# Patient Record
Sex: Male | Born: 1940 | Race: White | Hispanic: Yes | Marital: Married | Smoking: Never smoker
Health system: Southern US, Community
[De-identification: ages and names within clinical notes are randomized; demographics above are authoritative.]

## PROBLEM LIST (undated history)

## (undated) DIAGNOSIS — I25118 Atherosclerotic heart disease of native coronary artery with other forms of angina pectoris: Secondary | ICD-10-CM

## (undated) DIAGNOSIS — G479 Sleep disorder, unspecified: Secondary | ICD-10-CM

## (undated) DIAGNOSIS — I10 Essential (primary) hypertension: Secondary | ICD-10-CM

## (undated) HISTORY — DX: Atherosclerotic heart disease of native coronary artery with other forms of angina pectoris: I25.118

## (undated) HISTORY — PX: APPENDECTOMY: SHX54

## (undated) HISTORY — DX: Sleep disorder, unspecified: G47.9

---

## 2021-07-19 ENCOUNTER — Emergency Department (HOSPITAL_COMMUNITY): Payer: Medicare Other | Admitting: Anesthesiology

## 2021-07-19 ENCOUNTER — Encounter (HOSPITAL_COMMUNITY)
Admission: EM | Disposition: A | Discharge status: Rehabilitation Facility | Payer: Self-pay | Source: Home / Self Care | Attending: Neurology

## 2021-07-19 ENCOUNTER — Inpatient Hospital Stay (HOSPITAL_COMMUNITY)
Admission: EM | Admit: 2021-07-19 | Discharge: 2021-07-26 | DRG: 023 | Disposition: A | Discharge status: Rehabilitation Facility | Payer: Medicare Other | Attending: Neurology | Admitting: Neurology

## 2021-07-19 ENCOUNTER — Encounter (HOSPITAL_COMMUNITY): Payer: Self-pay | Admitting: Emergency Medicine

## 2021-07-19 ENCOUNTER — Emergency Department (HOSPITAL_COMMUNITY): Payer: Medicare Other

## 2021-07-19 DIAGNOSIS — H518 Other specified disorders of binocular movement: Secondary | ICD-10-CM | POA: Diagnosis present

## 2021-07-19 DIAGNOSIS — I472 Ventricular tachycardia: Secondary | ICD-10-CM | POA: Diagnosis present

## 2021-07-19 DIAGNOSIS — N390 Urinary tract infection, site not specified: Secondary | ICD-10-CM | POA: Diagnosis present

## 2021-07-19 DIAGNOSIS — E785 Hyperlipidemia, unspecified: Secondary | ICD-10-CM | POA: Diagnosis present

## 2021-07-19 DIAGNOSIS — I63132 Cerebral infarction due to embolism of left carotid artery: Secondary | ICD-10-CM | POA: Diagnosis present

## 2021-07-19 DIAGNOSIS — R7303 Prediabetes: Secondary | ICD-10-CM | POA: Diagnosis present

## 2021-07-19 DIAGNOSIS — D72829 Elevated white blood cell count, unspecified: Secondary | ICD-10-CM | POA: Diagnosis not present

## 2021-07-19 DIAGNOSIS — N179 Acute kidney failure, unspecified: Secondary | ICD-10-CM | POA: Diagnosis present

## 2021-07-19 DIAGNOSIS — N1832 Chronic kidney disease, stage 3b: Secondary | ICD-10-CM | POA: Diagnosis present

## 2021-07-19 DIAGNOSIS — I639 Cerebral infarction, unspecified: Secondary | ICD-10-CM

## 2021-07-19 DIAGNOSIS — I509 Heart failure, unspecified: Secondary | ICD-10-CM

## 2021-07-19 DIAGNOSIS — Z9282 Status post administration of tPA (rtPA) in a different facility within the last 24 hours prior to admission to current facility: Secondary | ICD-10-CM | POA: Diagnosis not present

## 2021-07-19 DIAGNOSIS — I493 Ventricular premature depolarization: Secondary | ICD-10-CM | POA: Diagnosis present

## 2021-07-19 DIAGNOSIS — I6522 Occlusion and stenosis of left carotid artery: Secondary | ICD-10-CM | POA: Diagnosis present

## 2021-07-19 DIAGNOSIS — I130 Hypertensive heart and chronic kidney disease with heart failure and stage 1 through stage 4 chronic kidney disease, or unspecified chronic kidney disease: Secondary | ICD-10-CM | POA: Diagnosis present

## 2021-07-19 DIAGNOSIS — E871 Hypo-osmolality and hyponatremia: Secondary | ICD-10-CM | POA: Diagnosis not present

## 2021-07-19 DIAGNOSIS — I63412 Cerebral infarction due to embolism of left middle cerebral artery: Secondary | ICD-10-CM | POA: Diagnosis present

## 2021-07-19 DIAGNOSIS — Z9289 Personal history of other medical treatment: Secondary | ICD-10-CM

## 2021-07-19 DIAGNOSIS — R7989 Other specified abnormal findings of blood chemistry: Secondary | ICD-10-CM | POA: Diagnosis not present

## 2021-07-19 DIAGNOSIS — I724 Aneurysm of artery of lower extremity: Secondary | ICD-10-CM | POA: Diagnosis not present

## 2021-07-19 DIAGNOSIS — K5900 Constipation, unspecified: Secondary | ICD-10-CM | POA: Diagnosis present

## 2021-07-19 DIAGNOSIS — D62 Acute posthemorrhagic anemia: Secondary | ICD-10-CM | POA: Diagnosis not present

## 2021-07-19 DIAGNOSIS — Z4659 Encounter for fitting and adjustment of other gastrointestinal appliance and device: Secondary | ICD-10-CM

## 2021-07-19 DIAGNOSIS — R29717 NIHSS score 17: Secondary | ICD-10-CM | POA: Diagnosis present

## 2021-07-19 DIAGNOSIS — I10 Essential (primary) hypertension: Secondary | ICD-10-CM | POA: Diagnosis not present

## 2021-07-19 DIAGNOSIS — R29810 Facial weakness: Secondary | ICD-10-CM | POA: Diagnosis present

## 2021-07-19 DIAGNOSIS — I240 Acute coronary thrombosis not resulting in myocardial infarction: Secondary | ICD-10-CM | POA: Diagnosis not present

## 2021-07-19 DIAGNOSIS — Z823 Family history of stroke: Secondary | ICD-10-CM

## 2021-07-19 DIAGNOSIS — J9601 Acute respiratory failure with hypoxia: Secondary | ICD-10-CM | POA: Diagnosis present

## 2021-07-19 DIAGNOSIS — Z20822 Contact with and (suspected) exposure to covid-19: Secondary | ICD-10-CM | POA: Diagnosis present

## 2021-07-19 DIAGNOSIS — G8191 Hemiplegia, unspecified affecting right dominant side: Secondary | ICD-10-CM | POA: Diagnosis present

## 2021-07-19 DIAGNOSIS — R4701 Aphasia: Secondary | ICD-10-CM | POA: Diagnosis present

## 2021-07-19 DIAGNOSIS — L8990 Pressure ulcer of unspecified site, unspecified stage: Secondary | ICD-10-CM | POA: Insufficient documentation

## 2021-07-19 DIAGNOSIS — I4891 Unspecified atrial fibrillation: Secondary | ICD-10-CM | POA: Diagnosis not present

## 2021-07-19 DIAGNOSIS — R471 Dysarthria and anarthria: Secondary | ICD-10-CM | POA: Diagnosis present

## 2021-07-19 DIAGNOSIS — I5033 Acute on chronic diastolic (congestive) heart failure: Secondary | ICD-10-CM | POA: Diagnosis present

## 2021-07-19 DIAGNOSIS — I513 Intracardiac thrombosis, not elsewhere classified: Secondary | ICD-10-CM | POA: Diagnosis present

## 2021-07-19 DIAGNOSIS — I63512 Cerebral infarction due to unspecified occlusion or stenosis of left middle cerebral artery: Secondary | ICD-10-CM | POA: Diagnosis not present

## 2021-07-19 DIAGNOSIS — G479 Sleep disorder, unspecified: Secondary | ICD-10-CM | POA: Diagnosis not present

## 2021-07-19 DIAGNOSIS — L89892 Pressure ulcer of other site, stage 2: Secondary | ICD-10-CM | POA: Diagnosis not present

## 2021-07-19 HISTORY — PX: IR CT HEAD LTD: IMG2386

## 2021-07-19 HISTORY — PX: RADIOLOGY WITH ANESTHESIA: SHX6223

## 2021-07-19 HISTORY — DX: Essential (primary) hypertension: I10

## 2021-07-19 HISTORY — PX: IR PERCUTANEOUS ART THROMBECTOMY/INFUSION INTRACRANIAL INC DIAG ANGIO: IMG6087

## 2021-07-19 LAB — DIFFERENTIAL
Abs Immature Granulocytes: 0.05 10*3/uL (ref 0.00–0.07)
Basophils Absolute: 0.1 10*3/uL (ref 0.0–0.1)
Basophils Relative: 1 %
Eosinophils Absolute: 0.2 10*3/uL (ref 0.0–0.5)
Eosinophils Relative: 2 %
Immature Granulocytes: 1 %
Lymphocytes Relative: 16 %
Lymphs Abs: 1.7 10*3/uL (ref 0.7–4.0)
Monocytes Absolute: 1.8 10*3/uL — ABNORMAL HIGH (ref 0.1–1.0)
Monocytes Relative: 17 %
Neutro Abs: 6.6 10*3/uL (ref 1.7–7.7)
Neutrophils Relative %: 63 %

## 2021-07-19 LAB — COMPREHENSIVE METABOLIC PANEL
ALT: 15 U/L (ref 0–44)
AST: 23 U/L (ref 15–41)
Albumin: 3.4 g/dL — ABNORMAL LOW (ref 3.5–5.0)
Alkaline Phosphatase: 54 U/L (ref 38–126)
Anion gap: 9 (ref 5–15)
BUN: 25 mg/dL — ABNORMAL HIGH (ref 8–23)
CO2: 22 mmol/L (ref 22–32)
Calcium: 8.8 mg/dL — ABNORMAL LOW (ref 8.9–10.3)
Chloride: 109 mmol/L (ref 98–111)
Creatinine, Ser: 1.53 mg/dL — ABNORMAL HIGH (ref 0.61–1.24)
GFR, Estimated: 46 mL/min — ABNORMAL LOW (ref >60)
Glucose, Bld: 77 mg/dL (ref 70–99)
Potassium: 3.9 mmol/L (ref 3.5–5.1)
Sodium: 140 mmol/L (ref 135–145)
Total Bilirubin: 1.1 mg/dL (ref 0.3–1.2)
Total Protein: 6.9 g/dL (ref 6.5–8.1)

## 2021-07-19 LAB — RESP PANEL BY RT-PCR (FLU A&B, COVID) ARPGX2
Influenza A by PCR: NEGATIVE
Influenza B by PCR: NEGATIVE
SARS Coronavirus 2 by RT PCR: NEGATIVE

## 2021-07-19 LAB — PROTIME-INR
INR: 1 (ref 0.8–1.2)
Prothrombin Time: 13.4 seconds (ref 11.4–15.2)

## 2021-07-19 LAB — I-STAT CHEM 8, ED
BUN: 30 mg/dL — ABNORMAL HIGH (ref 8–23)
Calcium, Ion: 0.96 mmol/L — ABNORMAL LOW (ref 1.15–1.40)
Chloride: 111 mmol/L (ref 98–111)
Creatinine, Ser: 1.5 mg/dL — ABNORMAL HIGH (ref 0.61–1.24)
Glucose, Bld: 76 mg/dL (ref 70–99)
HCT: 47 % (ref 39.0–52.0)
Hemoglobin: 16 g/dL (ref 13.0–17.0)
Potassium: 3.9 mmol/L (ref 3.5–5.1)
Sodium: 141 mmol/L (ref 135–145)
TCO2: 21 mmol/L — ABNORMAL LOW (ref 22–32)

## 2021-07-19 LAB — CBC
HCT: 48.9 % (ref 39.0–52.0)
Hemoglobin: 15.8 g/dL (ref 13.0–17.0)
MCH: 31 pg (ref 26.0–34.0)
MCHC: 32.3 g/dL (ref 30.0–36.0)
MCV: 95.9 fL (ref 80.0–100.0)
Platelets: 272 10*3/uL (ref 150–400)
RBC: 5.1 MIL/uL (ref 4.22–5.81)
RDW: 13.9 % (ref 11.5–15.5)
WBC: 10.4 10*3/uL (ref 4.0–10.5)
nRBC: 0 % (ref 0.0–0.2)

## 2021-07-19 LAB — CBG MONITORING, ED
Glucose-Capillary: 28 mg/dL — CL (ref 70–99)
Glucose-Capillary: 77 mg/dL (ref 70–99)

## 2021-07-19 LAB — APTT: aPTT: 34 seconds (ref 24–36)

## 2021-07-19 SURGERY — IR WITH ANESTHESIA
Anesthesia: General

## 2021-07-19 MED ORDER — LACTATED RINGERS IV SOLN: INTRAVENOUS | Status: DC | PRN

## 2021-07-19 MED ORDER — SODIUM CHLORIDE 0.9 % IV SOLN: INTRAVENOUS | Status: DC

## 2021-07-19 MED ORDER — SODIUM CHLORIDE 0.9 % IV SOLN
50.0000 mL | Freq: Once | INTRAVENOUS | Status: AC
  Administered 2021-07-19: 50 mL via INTRAVENOUS

## 2021-07-19 MED ORDER — FENTANYL CITRATE (PF) 100 MCG/2ML IJ SOLN
INTRAMUSCULAR | Status: AC
  Administered 2021-07-20: 25 ug via INTRAVENOUS
  Filled 2021-07-19: qty 2

## 2021-07-19 MED ORDER — CEFAZOLIN SODIUM-DEXTROSE 2-3 GM-%(50ML) IV SOLR
INTRAVENOUS | Status: DC | PRN
  Administered 2021-07-19: 2 g via INTRAVENOUS

## 2021-07-19 MED ORDER — GLYCOPYRROLATE 0.2 MG/ML IJ SOLN
INTRAMUSCULAR | Status: DC | PRN
  Administered 2021-07-19: .2 mg via INTRAVENOUS

## 2021-07-19 MED ORDER — IOHEXOL 350 MG/ML SOLN
100.0000 mL | Freq: Once | INTRAVENOUS | Status: AC | PRN
  Administered 2021-07-19: 100 mL via INTRAVENOUS

## 2021-07-19 MED ORDER — ASPIRIN 81 MG PO CHEW
CHEWABLE_TABLET | ORAL | Status: AC
  Filled 2021-07-19: qty 1

## 2021-07-19 MED ORDER — LIDOCAINE HCL (CARDIAC) PF 100 MG/5ML IV SOSY
PREFILLED_SYRINGE | INTRAVENOUS | Status: DC | PRN
  Administered 2021-07-19: 80 mg via INTRAVENOUS

## 2021-07-19 MED ORDER — PANTOPRAZOLE SODIUM 40 MG IV SOLR
40.0000 mg | Freq: Every day | INTRAVENOUS | Status: DC
  Administered 2021-07-20 (×2): 40 mg via INTRAVENOUS
  Filled 2021-07-19 (×2): qty 40

## 2021-07-19 MED ORDER — ACETAMINOPHEN 325 MG PO TABS
650.0000 mg | ORAL_TABLET | ORAL | Status: DC | PRN
  Administered 2021-07-22 – 2021-07-26 (×4): 650 mg via ORAL
  Filled 2021-07-19 (×4): qty 2

## 2021-07-19 MED ORDER — TICAGRELOR 90 MG PO TABS
ORAL_TABLET | ORAL | Status: AC
  Filled 2021-07-19: qty 2

## 2021-07-19 MED ORDER — EPTIFIBATIDE 20 MG/10ML IV SOLN
INTRAVENOUS | Status: AC
  Filled 2021-07-19: qty 10

## 2021-07-19 MED ORDER — SUCCINYLCHOLINE CHLORIDE 200 MG/10ML IV SOSY
PREFILLED_SYRINGE | INTRAVENOUS | Status: DC | PRN
  Administered 2021-07-19: 100 mg via INTRAVENOUS

## 2021-07-19 MED ORDER — STROKE: EARLY STAGES OF RECOVERY BOOK
Freq: Once | Status: AC
  Filled 2021-07-19: qty 1

## 2021-07-19 MED ORDER — IOHEXOL 300 MG/ML  SOLN
150.0000 mL | Freq: Once | INTRAMUSCULAR | Status: AC | PRN
  Administered 2021-07-19: 40 mL via INTRA_ARTERIAL

## 2021-07-19 MED ORDER — CEFAZOLIN SODIUM-DEXTROSE 2-4 GM/100ML-% IV SOLN
INTRAVENOUS | Status: AC
  Filled 2021-07-19: qty 100

## 2021-07-19 MED ORDER — NITROGLYCERIN 1 MG/10 ML FOR IR/CATH LAB
INTRA_ARTERIAL | Status: AC
  Filled 2021-07-19: qty 10

## 2021-07-19 MED ORDER — SODIUM CHLORIDE 0.9 % IV SOLN
INTRAVENOUS | Status: DC
Start: 2021-07-19 — End: 2021-07-21

## 2021-07-19 MED ORDER — CHLORHEXIDINE GLUCONATE 0.12% ORAL RINSE (MEDLINE KIT)
15.0000 mL | Freq: Two times a day (BID) | OROMUCOSAL | Status: DC
  Administered 2021-07-19 – 2021-07-20 (×3): 15 mL via OROMUCOSAL

## 2021-07-19 MED ORDER — VERAPAMIL HCL 2.5 MG/ML IV SOLN
INTRAVENOUS | Status: AC
  Filled 2021-07-19: qty 2

## 2021-07-19 MED ORDER — LABETALOL HCL 5 MG/ML IV SOLN
INTRAVENOUS | Status: AC
  Administered 2021-07-19: 10 mg via INTRAVENOUS
  Filled 2021-07-19: qty 4

## 2021-07-19 MED ORDER — PROPOFOL 500 MG/50ML IV EMUL
INTRAVENOUS | Status: DC | PRN
  Administered 2021-07-19: 60 ug/kg/min via INTRAVENOUS

## 2021-07-19 MED ORDER — ACETAMINOPHEN 160 MG/5ML PO SOLN: 650.0000 mg | ORAL | Status: DC | PRN

## 2021-07-19 MED ORDER — ALTEPLASE (STROKE) FULL DOSE INFUSION
0.9000 mg/kg | Freq: Once | INTRAVENOUS | Status: AC
  Administered 2021-07-19: 81.5 mg via INTRAVENOUS
  Filled 2021-07-19: qty 100

## 2021-07-19 MED ORDER — ACETAMINOPHEN 325 MG PO TABS: 650.0000 mg | ORAL_TABLET | ORAL | Status: DC | PRN

## 2021-07-19 MED ORDER — PHENYLEPHRINE HCL-NACL 20-0.9 MG/250ML-% IV SOLN
INTRAVENOUS | Status: DC | PRN
  Administered 2021-07-19: 40 ug/min via INTRAVENOUS

## 2021-07-19 MED ORDER — PHENYLEPHRINE HCL (PRESSORS) 10 MG/ML IV SOLN
INTRAVENOUS | Status: DC | PRN
  Administered 2021-07-19: 160 ug via INTRAVENOUS
  Administered 2021-07-19: 120 ug via INTRAVENOUS

## 2021-07-19 MED ORDER — ALBUMIN HUMAN 5 % IV SOLN: INTRAVENOUS | Status: DC | PRN

## 2021-07-19 MED ORDER — DEXAMETHASONE SODIUM PHOSPHATE 10 MG/ML IJ SOLN
INTRAMUSCULAR | Status: DC | PRN
  Administered 2021-07-19: 5 mg via INTRAVENOUS

## 2021-07-19 MED ORDER — SENNOSIDES-DOCUSATE SODIUM 8.6-50 MG PO TABS
1.0000 | ORAL_TABLET | Freq: Every evening | ORAL | Status: DC | PRN
  Administered 2021-07-25: 1 via ORAL
  Filled 2021-07-19: qty 1

## 2021-07-19 MED ORDER — CLEVIDIPINE BUTYRATE 0.5 MG/ML IV EMUL: 0.0000 mg/h | INTRAVENOUS | Status: DC

## 2021-07-19 MED ORDER — EPHEDRINE SULFATE 50 MG/ML IJ SOLN
INTRAMUSCULAR | Status: DC | PRN
  Administered 2021-07-19: 10 mg via INTRAVENOUS
  Administered 2021-07-19 (×3): 5 mg via INTRAVENOUS

## 2021-07-19 MED ORDER — ROCURONIUM BROMIDE 100 MG/10ML IV SOLN
INTRAVENOUS | Status: DC | PRN
  Administered 2021-07-19: 20 mg via INTRAVENOUS
  Administered 2021-07-19: 50 mg via INTRAVENOUS

## 2021-07-19 MED ORDER — SODIUM CHLORIDE 0.9% FLUSH
3.0000 mL | Freq: Once | INTRAVENOUS | Status: AC
  Administered 2021-07-23: 3 mL via INTRAVENOUS

## 2021-07-19 MED ORDER — ORAL CARE MOUTH RINSE
15.0000 mL | OROMUCOSAL | Status: DC
  Administered 2021-07-20 – 2021-07-21 (×10): 15 mL via OROMUCOSAL

## 2021-07-19 MED ORDER — CHLORHEXIDINE GLUCONATE CLOTH 2 % EX PADS
6.0000 | MEDICATED_PAD | Freq: Every day | CUTANEOUS | Status: DC
  Administered 2021-07-20 – 2021-07-26 (×7): 6 via TOPICAL

## 2021-07-19 MED ORDER — IOHEXOL 240 MG/ML SOLN
50.0000 mL | Freq: Once | INTRAMUSCULAR | Status: AC | PRN
  Administered 2021-07-19: 30 mL via INTRA_ARTERIAL

## 2021-07-19 MED ORDER — CANGRELOR TETRASODIUM 50 MG IV SOLR
INTRAVENOUS | Status: AC
  Filled 2021-07-19: qty 50

## 2021-07-19 MED ORDER — FENTANYL CITRATE (PF) 100 MCG/2ML IJ SOLN
INTRAMUSCULAR | Status: DC | PRN
  Administered 2021-07-19: 100 ug via INTRAVENOUS

## 2021-07-19 MED ORDER — ACETAMINOPHEN 650 MG RE SUPP: 650.0000 mg | RECTAL | Status: DC | PRN

## 2021-07-19 MED ORDER — LABETALOL HCL 5 MG/ML IV SOLN: 20.0000 mg | Freq: Once | INTRAVENOUS | Status: AC

## 2021-07-19 MED ORDER — CLOPIDOGREL BISULFATE 300 MG PO TABS
ORAL_TABLET | ORAL | Status: AC
  Filled 2021-07-19: qty 1

## 2021-07-19 MED ORDER — PROPOFOL 10 MG/ML IV BOLUS
INTRAVENOUS | Status: DC | PRN
  Administered 2021-07-19: 100 mg via INTRAVENOUS

## 2021-07-19 MED ORDER — TIROFIBAN HCL IN NACL 5-0.9 MG/100ML-% IV SOLN
INTRAVENOUS | Status: AC
  Filled 2021-07-19: qty 100

## 2021-07-19 MED ORDER — ONDANSETRON HCL 4 MG/2ML IJ SOLN
INTRAMUSCULAR | Status: DC | PRN
  Administered 2021-07-19: 4 mg via INTRAVENOUS

## 2021-07-19 NOTE — Progress Notes (Signed)
Pt intubated, under the care of anesthesia. Unable to assess NIH

## 2021-07-19 NOTE — Progress Notes (Signed)
IR tech holding pressure at this time to right groin for hemostasis

## 2021-07-19 NOTE — Anesthesia Procedure Notes (Signed)
Procedure Name: Intubation Date/Time: 07/19/2021 8:47 PM Performed by: Namiyah Grantham T, CRNA Pre-anesthesia Checklist: Patient identified, Emergency Drugs available, Suction available and Patient being monitored Patient Re-evaluated:Patient Re-evaluated prior to induction Oxygen Delivery Method: Circle system utilized Preoxygenation: Pre-oxygenation with 100% oxygen Induction Type: IV induction, Rapid sequence and Cricoid Pressure applied Ventilation: Mask ventilation without difficulty Laryngoscope Size: Mac and 4 Grade View: Grade I Tube type: Oral Tube size: 7.5 mm Number of attempts: 1 Airway Equipment and Method: Stylet and Oral airway Placement Confirmation: ETT inserted through vocal cords under direct vision, positive ETCO2 and breath sounds checked- equal and bilateral Secured at: 22 cm Tube secured with: Tape Dental Injury: Teeth and Oropharynx as per pre-operative assessment  Comments: Pt with bloody lip upon arrival to procedure room. Unable to tell source of bleeding. No further dental/oral damage noted

## 2021-07-19 NOTE — Progress Notes (Signed)
Patient ID: Roberto Dickson, male   DOB: Dec 16, 1940, 80 y.o.   MRN: [id number] INR. 80 Y RT H M MRS 0 LSW 1700. New onset of Lt gaze deviation,Rt sided weakness and aphasia.  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 Endovascular treatment D/W spouse and daughter .Reasons,procedure,alternatives reviewed ina 3 way call. Risks of ICH of 10 %,worsening neuro function , death and inability to revascularize discussed. Daughter and spouse expressed understanding and provided consent to the treatment. S.Ciena Sampley MD

## 2021-07-19 NOTE — Transfer of Care (Signed)
Immediate Anesthesia Transfer of Care Note  Patient: Roberto Dickson  Procedure(s) Performed: IR WITH ANESTHESIA  Patient Location: ICU  Anesthesia Type:General  Level of Consciousness: Patient remains intubated per anesthesia plan  Airway & Oxygen Therapy: Patient remains intubated per anesthesia plan and Patient placed on Ventilator (see vital sign flow sheet for setting)  Post-op Assessment: Report Dickson to RN and Post -op Vital signs reviewed and stable  Post vital signs: Reviewed and stable  Last Vitals:  Vitals Value Taken Time  BP 136/89 07/19/21 2304  Temp    Pulse 53 07/19/21 2315  Resp 17 07/19/21 2315  SpO2 98 % 07/19/21 2315  Vitals shown include unvalidated device data.  Last Pain:  Vitals:   07/19/21 1956  TempSrc: Oral  PainSc: 0-No pain         Complications: No notable events documented.

## 2021-07-19 NOTE — Progress Notes (Signed)
Scan done

## 2021-07-19 NOTE — Anesthesia Procedure Notes (Signed)
Arterial Line Insertion Start/End8/16/2022 8:48 PM, 07/19/2021 8:53 PM Performed by: Molli Hazard, CRNA, CRNA  Emergency situation Left, radial was placed Catheter size: 20 G Hand hygiene performed  and Seldinger technique used  Attempts: 1 Procedure performed without using ultrasound guided technique. Following insertion, dressing applied and Biopatch. Patient tolerated the procedure well with no immediate complications.

## 2021-07-19 NOTE — H&P (Addendum)
NEUROLOGY CONSULTATION NOTE   Date of service: July 19, 2021 Patient Name: Roberto Dickson MRN:  [id number] DOB:  [birth year] Reason for consult: "Stroke code" Requesting Provider: Erick Blinks, MD _ _ _   _ __   _ __ _ _  __ __   _ __   __ _  History of Present Illness  Roberto Dickson is a 80 y.o. Dickson with PMH significant for HTN, has not seen a PCP in several years who went to bed around 1700 to take a nap and woke up at 1830 and could not get out of bed and was not talking, was able to follow some commands for her. Wife called daughter who called EMS.  Was noted by EMS to have L gaze deviation, R facial droop and RUE weakness and aphasia. He was brought in Roberto a stroke code. Last saw his PCP maybe 4+ years ago. Has HTN but no other illness. Does not eat vegetables, no smoking. No hx of bleeding in his head or elsewhere, no recent MI, no recent trauma or surgery, not on any anticoagulation.  Glucose per EMS was 103, here initial reading was 23, repeat reading was 76. I suspect that the low initial value was probably an error.  mRS: 0 LKW: 1700 on 07/19/21 tPA: yes given. Risks and benefits discussed with patient's wife and daughter over the phone including 33% chance of improvement and about 3% risk of ICH. Thrombectomy: Noted to have L ICA occlusion at origin upto terminus with distal reconsitution. Dr. Corliss Skains discussed risks and benefits with mother and daughter over phone and I witnessed the consent process. They did not have any questions for Dr. Corliss Skains. Family consented for thrombectomy. NIHSS components Score: Comment  1a Level of Conscious 0[x]  1[]  2[]  3[]      1b LOC Questions 0[]  1[]  2[x]       1c LOC Commands 0[]  1[x]  2[]       2 Best Gaze 0[]  1[]  2[x]       3 Visual 0[]  1[]  2[x]  3[]      4 Facial Palsy 0[]  1[x]  2[]  3[]      5a Motor Arm - left 0[x]  1[]  2[]  3[]  4[]  UN[]    5b Motor Arm - Right 0[]  1[]  2[]  3[x]  4[]  UN[]    6a Motor Leg - Left 0[x]  1[]  2[]  3[]  4[]  UN[]    6b  Motor Leg - Right 0[]  1[x]  2[]  3[]  4[]  UN[]    7 Limb Ataxia 0[x]  1[]  2[]  3[]  UN[]     8 Sensory 0[]  1[x]  2[]  UN[]      9 Best Language 0[]  1[]  2[x]  3[]      10 Dysarthria 0[x]  1[]  2[]  UN[]      11 Extinct. and Inattention 0[]  1[]  2[x]       TOTAL: 17       ROS   Unable to obtain 2/2 Aphasia.  Past History   Past Medical History:  Diagnosis Date   Hypertension    Past Surgical History:  Procedure Laterality Date   NO PAST SURGERIES     History reviewed. No pertinent family history. Social History   Socioeconomic History   Marital status: Not on file    Spouse name: Not on file   Number of children: Not on file   Years of education: Not on file   Highest education level: Not on file  Occupational History   Not on file  Tobacco Use   Smoking status: Never   Smokeless tobacco: Never  Substance and Sexual Activity   Alcohol use:  Yes    Alcohol/week: 1.0 standard drink    Types: 1 Cans of beer per week   Drug use: Not on file   Sexual activity: Not on file  Other Topics Concern   Not on file  Social History Narrative   Not on file   Social Determinants of Health   Financial Resource Strain: Not on file  Food Insecurity: Not on file  Transportation Needs: Not on file  Physical Activity: Not on file  Stress: Not on file  Social Connections: Not on file   Not on File  Medications  (Not in a hospital admission)    Vitals   Vitals:   07/19/21 1956 07/19/21 2000 07/19/21 2015 07/19/21 2030  BP: (!) 146/103  (!) 145/87 (!) 148/98  Pulse: 78  80 77  Resp: 18  20 18   Temp: 97.8 F (36.6 C)  97.8 F (36.6 C) 98 F (36.7 C)  TempSrc: Oral     SpO2: 93%  93% 93%  Weight:  90.5 kg       There is no height or weight on file to calculate BMI.  Physical Exam   General: Laying comfortably in bed; in no acute distress.  HENT: Normal oropharynx and mucosa. Normal external appearance of ears and nose.  Neck: Supple, no pain or tenderness  CV: No JVD. No  peripheral edema.  Pulmonary: Symmetric Chest rise. Normal respiratory effort.  Abdomen: Soft to touch, non-tender.  Ext: No cyanosis, edema, or deformity  Skin: No rash. Normal palpation of skin.   Musculoskeletal: Normal digits and nails by inspection. No clubbing.   Neurologic Examination  Mental status/Cognition: Alert, aphasia precludes assessment of orientation. Speech/language: Non fluent, speech does not make sense. Able to intermittently/partially comprehend simple instructions. Cranial nerves:   CN II Pupils equal and reactive to light, does not blink to threat on the Right   CN III,IV,VI L gaze deviation   CN V normal sensation in V1, V2, and V3 segments bilaterally   CN VII R facial droop   CN VIII Does not turn head to speech on the right.   CN IX & X    CN XI    CN XII    Motor:  Muscle bulk: normal, tone flaccid in RUE Mvmt Root Nerve  Muscle Right Left Comments  SA C5/6 Ax Deltoid 0 4   EF C5/6 Mc Biceps 0    EE C6/7/8 Rad Triceps 0    WF C6/7 Med FCR     WE C7/8 PIN ECU     F Ab C8/T1 U ADM/FDI 1 5   HF L1/2/3 Fem Illopsoas 3 4   KE L2/3/4 Fem Quad     DF L4/5 D Peron Tib Ant 2 5   PF S1/2 Tibial Grc/Sol      Reflexes:  Right Left Comments  Pectoralis      Biceps (C5/6) 2 2   Brachioradialis (C5/6) 2 2    Triceps (C6/7) 2 2    Patellar (L3/4) 2 2    Achilles (S1)      Hoffman      Plantar     Jaw jerk    Sensation:  Light touch Grimaces to pinch in all extremities. Localizes in LUE and LLE but not in RUE and RLE.   Pin prick    Temperature    Vibration   Proprioception    Coordination/Complex Motor:  - Finger to Nose unable to do but no ovious ataxia - Heel  to shin unable to do - Rapid alternating movement unable to get him to do. - Gait: not safe to assess given concern for strokes.  Labs   CBC:  Recent Labs  Lab 07/19/21 1947 07/19/21 1953  WBC 10.4  --   NEUTROABS 6.6  --   HGB 15.8 16.0  HCT 48.9 47.0  MCV 95.9  --   PLT 272   --     Basic Metabolic Panel:  Lab Results  Component Value Date   NA 141 07/19/2021   K 3.9 07/19/2021   CO2 22 07/19/2021   GLUCOSE 76 07/19/2021   BUN 30 (H) 07/19/2021   CREATININE 1.50 (H) 07/19/2021   CALCIUM 8.8 (L) 07/19/2021   GFRNONAA 46 (L) 07/19/2021   Lipid Panel: No results found for: LDLCALC HgbA1c: No results found for: HGBA1C Urine Drug Screen: No results found for: LABOPIA, COCAINSCRNUR, LABBENZ, AMPHETMU, THCU, LABBARB  Alcohol Level No results found for: ETH  CT Head without contrast: Personally reviewed and CTH was negative for a large hypodensity concerning for a large territory infarct or hyperdensity concerning for an ICH. ASPECTS of 10.  CT angio Head and Neck with contrast: L ICA occlusion at the origin in the neck and all the way upto terminus. No intracranial occlusion or high grade stenosis.  MRI Brain: pending Impression   Michel Hendon is a 80 y.o. Dickson with PMH significant for HTN who presents with L gaze deviation, R facial droop and RUE weakness and aphasia. Found to have L ICA occlusion at the origin upto the terminus. 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  He was given tPA and taken for revascularization.  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   Secondary Diagnosis: Essential (primary) hypertension  Recommendations   Plan: - Frequent NeuroChecks for post tPA care per stroke unit protocol: - Initial CTH demonstrated no acute hemorrhage or mass - MRI Brain - pending. - CTA - with L ICA occlusion. - TTE - pending. - Lipid Panel: LDL - pending.  - Statin: if LDL > 70 - HbA1c: pending. - Antithrombotic: Start ASA 81 mg daily if 24 h CTH does not show acute hemorrhage - DVT prophylaxis: SCDs. Pharmacologic prophylaxis if 24 h CTH does not demonstrate acute hemorrhage - Systolic Blood Pressure goal: per Neuro IR for first 24 hours  after revascularization - Telemetry monitoring for arrhythmia: 72 hours - Swallow screen - ordered - PT/OT/SLP consults  HTN: - Goal SBP Roberto above - hold home Antihypertensive meds.   ______________________________________________________________________  This patient is critically ill and at significant risk of neurological worsening, death and care requires constant monitoring of vital signs, hemodynamics,respiratory and cardiac monitoring, neurological assessment, discussion with family, other specialists and medical decision making of high complexity. I spent 90 minutes of neurocritical care time  in the care of  this patient. This was time spent independent of any time provided by nurse practitioner or PA.  Erick Blinks [facility] Pager Number [phone] 07/19/2021  9:20 PM    Thank you for the opportunity to take part in the care of this patient. If you have any further questions, please contact the neurology consultation attending.  Signed,  Erick Blinks [facility] Pager Number [phone] _ _ _   _ __   _ __ _ _  __ __   _ __   __ _

## 2021-07-19 NOTE — ED Triage Notes (Addendum)
Pt bib EMS from home. Code stroke paged for flaccid right side and right facial droop after pt took a nap. LKW at 1750.  No history of stroke or blood thinners per family.   Vitals:  BP: 142/90 Hr: 90 O2: 93% CBG: 107

## 2021-07-19 NOTE — Anesthesia Preprocedure Evaluation (Addendum)
Anesthesia Evaluation  Patient identified by MRN, date of birth, ID bandPreop documentation limited or incomplete due to emergent nature of procedure.  Airway Mallampati: II  TM Distance: >3 FB Neck ROM: Full  Mouth opening: Limited Mouth Opening  Dental  (+) Poor Dentition Dried blood around upper/lower lips 2/2 to likely lip bite with stroke. No open lesions or uncontrolled bleeding. :   Pulmonary neg pulmonary ROS,    Pulmonary exam normal        Cardiovascular hypertension,  Rhythm:Regular Rate:Normal     Neuro/Psych CODE STROKE. LKN ~1700 with right sided weakness.  CVA negative psych ROS   GI/Hepatic negative GI ROS, Neg liver ROS,   Endo/Other  negative endocrine ROS  Renal/GU negative Renal ROS  negative genitourinary   Musculoskeletal negative musculoskeletal ROS (+)   Abdominal (+)  Abdomen: soft.    Peds  Hematology negative hematology ROS (+)   Anesthesia Other Findings Found at home post nap with right sided weakness. Spanish speaking only.  Reproductive/Obstetrics                            Anesthesia Physical Anesthesia Plan  ASA: 4 and emergent  Anesthesia Plan: General   Post-op Pain Management:    Induction: Intravenous and Rapid sequence  PONV Risk Score and Plan: 2 and Ondansetron, Dexamethasone and Treatment may vary due to age or medical condition  Airway Management Planned: Mask and Oral ETT  Additional Equipment: Arterial line  Intra-op Plan:   Post-operative Plan: Possible Post-op intubation/ventilation  Informed Consent:     Only emergency history available  Plan Discussed with: CRNA  Anesthesia Plan Comments: (Lab Results      Component                Value               Date                      WBC                      10.4                07/19/2021                HGB                      16.0                07/19/2021                HCT                       47.0                07/19/2021                MCV                      95.9                07/19/2021                PLT                      272  07/19/2021           Lab Results      Component                Value               Date                      NA                       141                 07/19/2021                K                        3.9                 07/19/2021                CO2                      22                  07/19/2021                GLUCOSE                  76                  07/19/2021                BUN                      30 (H)              07/19/2021                CREATININE               1.50 (H)            07/19/2021                CALCIUM                  8.8 (L)             07/19/2021                GFRNONAA                 46 (L)              07/19/2021          )       Anesthesia Quick Evaluation

## 2021-07-19 NOTE — Progress Notes (Signed)
Post IR intervention. Pt remains intubated and sedated. Under the care of ansthesia 

## 2021-07-19 NOTE — Progress Notes (Signed)
Anesthesia present for case 

## 2021-07-19 NOTE — Progress Notes (Signed)
Pharmacist Code Stroke Response  Notified to mix tPA at 1953 by Dr. Tyrone Schimke Delivered tPA to RN at 1957  tPA dose = 8.2mg  bolus over 1 minute followed by 73.3mg  for a total dose of 81.5mg  over 1 hour  Issues/delays encountered (if applicable): none  Vinnie Level, PharmD., BCPS, BCCCP Clinical Pharmacist Please refer to [facility] for unit-specific pharmacist

## 2021-07-19 NOTE — Procedures (Signed)
S/P Lt common carotid arteriogram followed by complete revascularization of occluded Lt ICA extracranially and intracranially and Lt  Lt MCA at origin with x 1 pass with 6 mmx 40 mm solitaiire X retriever and  contact aspiration achieving a TICI 3 revascularization . POst CT brain No ICH . Manual compression and quick clot applied for hemostasis  at the RT groin puncture site. Distal pulses all dopplerable. Patient left intubated awaiting covid test and patient not  being able to communicate due  to language barrier. S.Saiquan Hands MD

## 2021-07-19 NOTE — Progress Notes (Signed)
IR tech noted and marked hematoma after holding pressure. IR tech immediately began holding pressure again to right groin with quick clot.

## 2021-07-19 NOTE — ED Provider Notes (Signed)
[facility] EMERGENCY DEPARTMENT Provider Note   CSN: [id number] Arrival date & time: 07/19/21  1943  An emergency department physician performed an initial assessment on this suspected stroke patient at 60.  History Chief Complaint  Patient presents with   Code Stroke    Roberto Dickson is a 80 y.o. male.  This is 80 yo spanish speaking male who has history as below, presented to ED for evaluation of stroke like symptoms. Difficult to obtain history secondary to language barrier and severity of symptoms. I assessed the patients airway in the ambulance bay, this was stable and patient was met by neurology staff, sent directly to CT for evaluation of right sided deficit. History obtained through discussion with EMS. POC glucose was low upon arrival.   Level 5 caveat, AMS, acuity of situation   The history is provided by the EMS personnel. The history is limited by the condition of the patient and a language barrier. No language interpreter was used.      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 No pertinent family history.  Social History   Tobacco Use   Smoking status: Never   Smokeless tobacco: Never  Substance Use Topics   Alcohol use: Yes    Alcohol/week: 1.0 standard drink    Types: 1 Cans of beer per week    Home Medications Prior to Admission medications   Not on File    Allergies    Patient has no allergy information on record.  Review of Systems   Review of Systems  Unable to perform ROS: Acuity of condition   Physical Exam Updated Vital Signs BP 121/73   Pulse (!) 53   Temp 97.8 F (36.6 C) (Axillary)   Resp 14   Wt 90.5 kg   SpO2 98%   Physical  Exam Vitals and nursing note reviewed.  Constitutional:      General: He is in acute distress.     Appearance: He is not diaphoretic.  HENT:     Head: Normocephalic and atraumatic.     Right Ear: External ear normal.     Left Ear: External ear normal.     Nose: Nose normal.     Mouth/Throat:     Mouth: Mucous membranes are moist.  Eyes:     General: No scleral icterus.       Right eye: No discharge.        Left eye: No discharge.     Pupils: Pupils are equal, round, and reactive to light.  Cardiovascular:     Rate and Rhythm: Normal rate and regular rhythm.     Pulses: Normal pulses.     Heart sounds: Normal heart sounds. No murmur heard. Pulmonary:     Effort: Pulmonary effort is normal. No respiratory distress.     Breath sounds: No stridor.  Abdominal:     General: Abdomen is flat. There is no distension.  Musculoskeletal:        General: Normal range of motion.     Cervical back: Normal range of motion.  Skin:    General: Skin is warm and dry.     Capillary Refill:  Capillary refill takes less than 2 seconds.  Neurological:     Mental Status: He is lethargic and disoriented.     GCS: GCS eye subscore is 4. GCS verbal subscore is 4. GCS motor subscore is 6.     Cranial Nerves: Facial asymmetry present.     Sensory: Sensation is intact.     Motor: Motor function is intact.     Coordination: Coordination is intact.  Psychiatric:        Mood and Affect: Mood normal.        Behavior: Behavior normal.    ED Results / Procedures / Treatments   Labs (all labs ordered are listed, but only abnormal results are displayed) Labs Reviewed  DIFFERENTIAL - Abnormal; Notable for the following components:      Result Value   Monocytes Absolute 1.8 (*)    All other components within normal limits  COMPREHENSIVE METABOLIC PANEL - Abnormal; Notable for the following components:   BUN 25 (*)    Creatinine, Ser 1.53 (*)    Calcium 8.8 (*)    Albumin 3.4 (*)    GFR, Estimated 46  (*)    All other components within normal limits  I-STAT CHEM 8, ED - Abnormal; Notable for the following components:   BUN 30 (*)    Creatinine, Ser 1.50 (*)    Calcium, Ion 0.96 (*)    TCO2 21 (*)    All other components within normal limits  CBG MONITORING, ED - Abnormal; Notable for the following components:   Glucose-Capillary 28 (*)    All other components within normal limits  RESP PANEL BY RT-PCR (FLU A&B, COVID) ARPGX2  MRSA NEXT GEN BY PCR, NASAL  PROTIME-INR  APTT  CBC  HEMOGLOBIN A1C  LIPID PANEL  CBC WITH DIFFERENTIAL/PLATELET  BASIC METABOLIC PANEL  BLOOD GAS, ARTERIAL  CBG MONITORING, ED    EKG None  Radiology DG CHEST PORT 1 VIEW  Result Date: 07/20/2021 CLINICAL DATA:  Check gastric catheter placement EXAM: PORTABLE CHEST 1 VIEW COMPARISON:  None. FINDINGS: Endotracheal tube is noted in satisfactory position. Cardiac shadow is enlarged. Poor inspiratory effort is noted with bibasilar atelectasis. Gastric catheter is seen within the stomach. No bony abnormality is noted. IMPRESSION: Tubes and lines in satisfactory position. Mild bibasilar atelectatic changes are seen. Electronically Signed   By: Inez Catalina M.D.   On: 07/20/2021 00:29   CT HEAD CODE STROKE WO CONTRAST  Result Date: 07/19/2021 CLINICAL DATA:  Code stroke.  Acute neurologic deficit EXAM: CT HEAD WITHOUT CONTRAST TECHNIQUE: Contiguous axial images were obtained from the base of the skull through the vertex without intravenous contrast. COMPARISON:  None. FINDINGS: Brain: There is no mass, hemorrhage or extra-axial collection. The size and configuration of the ventricles and extra-axial CSF spaces are normal. 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 Vascular: No abnormal hyperdensity of the major intracranial arteries or dural venous sinuses. No intracranial atherosclerosis. Skull: The visualized skull base, calvarium and extracranial soft tissues are normal. Sinuses/Orbits: No fluid levels or advanced  mucosal thickening of the visualized paranasal sinuses. No mastoid or middle ear effusion. The orbits are normal. ASPECTS [facility] Stroke Program Early CT Score) - Ganglionic level infarction (caudate, lentiform nuclei, internal capsule, insula, M1-M3 cortex): 7 - Supraganglionic infarction (M4-M6 cortex): 3 Total score (0-10 with 10 being normal): 10 IMPRESSION: 1. No acute intracranial abnormality. 2. 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 3. ASPECTS is 10. 4. These results were called by telephone at the time of interpretation  on 07/19/2021 at 8:00 pm to provider Monroe , who verbally acknowledged these results. Electronically Signed   By: Ulyses Jarred M.D.   On: 07/19/2021 20:01   CT ANGIO HEAD NECK W WO CM (CODE STROKE)  Result Date: 07/19/2021 CLINICAL DATA:  Left MCA symptoms EXAM: CT ANGIOGRAPHY HEAD AND NECK TECHNIQUE: Multidetector CT imaging of the head and neck was performed using the standard protocol during bolus administration of intravenous contrast. Multiplanar CT image reconstructions and MIPs were obtained to evaluate the vascular anatomy. Carotid stenosis measurements (when applicable) are obtained utilizing NASCET criteria, using the distal internal carotid diameter as the denominator. CONTRAST:  196m OMNIPAQUE IOHEXOL 350 MG/ML SOLN COMPARISON:  None. FINDINGS: CTA NECK FINDINGS SKELETON: There is no bony spinal canal stenosis. No lytic or blastic lesion. OTHER NECK: Normal pharynx, larynx and major salivary glands. No cervical lymphadenopathy. Unremarkable thyroid gland. UPPER CHEST: No pneumothorax or pleural effusion. No nodules or masses. AORTIC ARCH: There is no calcific atherosclerosis of the aortic arch. There is no aneurysm, dissection or hemodynamically significant stenosis of the visualized portion of the aorta. Conventional 3 vessel aortic branching pattern. The visualized proximal subclavian arteries are widely patent. RIGHT CAROTID SYSTEM: Normal without aneurysm,  dissection or stenosis. LEFT CAROTID SYSTEM: The left ICA is occluded at its origin and remains occluded to the carotid terminus. VERTEBRAL ARTERIES: Left dominant configuration. Both origins are clearly patent. There is no dissection, occlusion or flow-limiting stenosis to the skull base (V1-V3 segments). CTA HEAD FINDINGS POSTERIOR CIRCULATION: --Vertebral arteries: Normal V4 segments. --Inferior cerebellar arteries: Normal. --Basilar artery: Normal. --Superior cerebellar arteries: Normal. --Posterior cerebral arteries (PCA): Normal. ANTERIOR CIRCULATION: --Intracranial internal carotid arteries: Normal. --Anterior cerebral arteries (ACA): Normal. Both A1 segments are present. Patent anterior communicating artery (a-comm). --Middle cerebral arteries (MCA): Right MCA is normal. The left MCA is opacified via collateral flow across the anterior communicating artery. VENOUS SINUSES: As permitted by contrast timing, patent. ANATOMIC VARIANTS: None Review of the MIP images confirms the above findings. IMPRESSION: 1. No intracranial arterial occlusion or high-grade stenosis. 2. 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 The left MCA is opacified via collateral flow across the anterior communicating artery. Aortic Atherosclerosis (ICD10-I70.0). Electronically Signed   By: KUlyses JarredM.D.   On: 07/19/2021 20:21    Procedures .Critical Care  Date/Time: 07/19/2021 8:56 PM Performed by: GJeanell Sparrow DO Authorized by: GJeanell Sparrow DO   Critical care provider statement:    Critical care time (minutes):  36   Critical care time was exclusive of:  Separately billable procedures and treating other patients   Critical care was necessary to treat or prevent imminent or life-threatening deterioration of the following conditions:  CNS failure or compromise (CVA, TPA)   Critical care was time spent personally by me on the following activities:  Discussions with  consultants, evaluation of patient's response to treatment, examination of patient, ordering and performing treatments and interventions, ordering and review of laboratory studies, ordering and review of radiographic studies, pulse oximetry, re-evaluation of patient's condition, obtaining history from patient or surrogate and review of old charts   Medications Ordered in ED Medications  sodium chloride flush (NS) 0.9 % injection 3 mL (has no administration in time range)  ceFAZolin (ANCEF) 2-4 GM/100ML-% IVPB (has no administration in time range)  nitroGLYCERIN 100 mcg/mL intra-arterial injection (has no administration in time range)  0.9 %  sodium chloride infusion ( Intravenous New Bag/Given  07/19/21 2324)  senna-docusate (Senokot-S) tablet 1 tablet (has no administration in time range)  pantoprazole (PROTONIX) injection 40 mg (40 mg Intravenous Given 07/20/21 0012)  acetaminophen (TYLENOL) tablet 650 mg (has no administration in time range)    Or  acetaminophen (TYLENOL) 160 MG/5ML solution 650 mg (has no administration in time range)    Or  acetaminophen (TYLENOL) suppository 650 mg (has no administration in time range)  0.9 %  sodium chloride infusion (has no administration in time range)  clevidipine (CLEVIPREX) infusion 0.5 mg/mL (has no administration in time range)  chlorhexidine gluconate (MEDLINE KIT) (PERIDEX) 0.12 % solution 15 mL (15 mLs Mouth Rinse Given 07/19/21 2345)  MEDLINE mouth rinse (0 mLs Mouth Rinse Duplicate 5/59/74 1638)  Chlorhexidine Gluconate Cloth 2 % PADS 6 each (has no administration in time range)  0.9 %  sodium chloride infusion (has no administration in time range)  norepinephrine (LEVOPHED) 1m in 2558mpremix infusion (2 mcg/min Intravenous New Bag/Given 07/20/21 0012)  propofol (DIPRIVAN) 1000 MG/100ML infusion (5 mcg/kg/min  90.5 kg Intravenous New Bag/Given 07/20/21 0014)  alteplase (ACTIVASE) 1 mg/mL infusion SOLN 81.5 mg (0 mg Intravenous Stopped 07/19/21  2100)    Followed by  0.9 %  sodium chloride infusion (0 mLs Intravenous Stopped 07/19/21 2136)  iohexol (OMNIPAQUE) 350 MG/ML injection 100 mL (100 mLs Intravenous Contrast Given 07/19/21 2009)  fentaNYL (SUBLIMAZE) 100 MCG/2ML injection (  Override pull for Anesthesia 07/19/21 2054)   stroke: mapping our early stages of recovery book ( Does not apply Given 07/19/21 2346)  labetalol (NORMODYNE) injection 20 mg (10 mg Intravenous Given 07/19/21 2008)  iohexol (OMNIPAQUE) 240 MG/ML injection 50 mL (30 mLs Intra-arterial Contrast Given 07/19/21 2223)  iohexol (OMNIPAQUE) 240 MG/ML injection 50 mL (30 mLs Intra-arterial Contrast Given 07/19/21 2223)  iohexol (OMNIPAQUE) 300 MG/ML solution 150 mL (40 mLs Intra-arterial Contrast Given 07/19/21 2224)    ED Course  I have reviewed the triage vital signs and the nursing notes.  Pertinent labs & imaging results that were available during my care of the patient were reviewed by me and considered in my medical decision making (see chart for details).    MDM Rules/Calculators/A&P                          [age]o male with history as above to ED for evaluation of stroke like symptoms. Vital signs stable. Serious etiology considered.  Pt with right sided deficit, TPA considered given timing of symptom onset.   CT imaging obtained, reviewed.  Pt given tPA after evaluation and clearance by neuro /stroke team, thrombectomy undertaken.  Patient admitted to ICU for further care.   Final Clinical Impression(s) / ED Diagnoses Final diagnoses:  [facility] ([facility]), [facility]   Rx / DC Orders ED Discharge Orders     None        GrJeanell SparrowDO 07/20/21 0045

## 2021-07-19 NOTE — Progress Notes (Addendum)
Transported pt to 4N with CRNA and RT on vent without difficulty. Report given to 4N RN via telephone and at bedside. Right groin with quick clot, gauze and pressure dressing. Dressing clean dry and intact at hand off.

## 2021-07-19 NOTE — Progress Notes (Signed)
PT intubated and sedated, under the care of anesthesia.

## 2021-07-19 NOTE — Progress Notes (Signed)
Post IR intervention. Pt remains intubated and sedated. Under the care of ansthesia

## 2021-07-20 ENCOUNTER — Inpatient Hospital Stay (HOSPITAL_COMMUNITY): Payer: Medicare Other

## 2021-07-20 ENCOUNTER — Encounter (HOSPITAL_COMMUNITY): Payer: Self-pay | Admitting: Interventional Radiology

## 2021-07-20 DIAGNOSIS — I63512 Cerebral infarction due to unspecified occlusion or stenosis of left middle cerebral artery: Secondary | ICD-10-CM | POA: Diagnosis not present

## 2021-07-20 DIAGNOSIS — J9601 Acute respiratory failure with hypoxia: Secondary | ICD-10-CM | POA: Diagnosis not present

## 2021-07-20 LAB — BASIC METABOLIC PANEL
Anion gap: 9 (ref 5–15)
BUN: 27 mg/dL — ABNORMAL HIGH (ref 8–23)
CO2: 20 mmol/L — ABNORMAL LOW (ref 22–32)
Calcium: 8.1 mg/dL — ABNORMAL LOW (ref 8.9–10.3)
Chloride: 113 mmol/L — ABNORMAL HIGH (ref 98–111)
Creatinine, Ser: 1.74 mg/dL — ABNORMAL HIGH (ref 0.61–1.24)
GFR, Estimated: 40 mL/min — ABNORMAL LOW (ref >60)
Glucose, Bld: 180 mg/dL — ABNORMAL HIGH (ref 70–99)
Potassium: 4 mmol/L (ref 3.5–5.1)
Sodium: 142 mmol/L (ref 135–145)

## 2021-07-20 LAB — LIPID PANEL
Cholesterol: 179 mg/dL (ref 0–200)
HDL: 30 mg/dL — ABNORMAL LOW (ref >40)
LDL Cholesterol: 132 mg/dL — ABNORMAL HIGH (ref 0–99)
Total CHOL/HDL Ratio: 6 RATIO
Triglycerides: 84 mg/dL (ref <150)
VLDL: 17 mg/dL (ref 0–40)

## 2021-07-20 LAB — CBC WITH DIFFERENTIAL/PLATELET
Abs Immature Granulocytes: 0.08 10*3/uL — ABNORMAL HIGH (ref 0.00–0.07)
Basophils Absolute: 0 10*3/uL (ref 0.0–0.1)
Basophils Relative: 0 %
Eosinophils Absolute: 0 10*3/uL (ref 0.0–0.5)
Eosinophils Relative: 0 %
HCT: 45.3 % (ref 39.0–52.0)
Hemoglobin: 14.4 g/dL (ref 13.0–17.0)
Immature Granulocytes: 1 %
Lymphocytes Relative: 4 %
Lymphs Abs: 0.5 10*3/uL — ABNORMAL LOW (ref 0.7–4.0)
MCH: 30.8 pg (ref 26.0–34.0)
MCHC: 31.8 g/dL (ref 30.0–36.0)
MCV: 96.8 fL (ref 80.0–100.0)
Monocytes Absolute: 0.2 10*3/uL (ref 0.1–1.0)
Monocytes Relative: 2 %
Neutro Abs: 10.1 10*3/uL — ABNORMAL HIGH (ref 1.7–7.7)
Neutrophils Relative %: 93 %
Platelets: 245 10*3/uL (ref 150–400)
RBC: 4.68 MIL/uL (ref 4.22–5.81)
RDW: 14.3 % (ref 11.5–15.5)
WBC: 10.9 10*3/uL — ABNORMAL HIGH (ref 4.0–10.5)
nRBC: 0 % (ref 0.0–0.2)

## 2021-07-20 LAB — POCT I-STAT 7, (LYTES, BLD GAS, ICA,H+H)
Acid-Base Excess: 0 mmol/L (ref 0.0–2.0)
Bicarbonate: 25 mmol/L (ref 20.0–28.0)
Calcium, Ion: 1.11 mmol/L — ABNORMAL LOW (ref 1.15–1.40)
HCT: 41 % (ref 39.0–52.0)
Hemoglobin: 13.9 g/dL (ref 13.0–17.0)
O2 Saturation: 100 %
Patient temperature: 97.8
Potassium: 4.6 mmol/L (ref 3.5–5.1)
Sodium: 142 mmol/L (ref 135–145)
TCO2: 26 mmol/L (ref 22–32)
pCO2 arterial: 41.4 mmHg (ref 32.0–48.0)
pH, Arterial: 7.388 (ref 7.350–7.450)
pO2, Arterial: 393 mmHg — ABNORMAL HIGH (ref 83.0–108.0)

## 2021-07-20 LAB — MRSA NEXT GEN BY PCR, NASAL: MRSA by PCR Next Gen: NOT DETECTED

## 2021-07-20 LAB — HEMOGLOBIN A1C
Hgb A1c MFr Bld: 5.9 % — ABNORMAL HIGH (ref 4.8–5.6)
Mean Plasma Glucose: 122.63 mg/dL

## 2021-07-20 LAB — ECHOCARDIOGRAM COMPLETE
Area-P 1/2: 4.19 cm2
Weight: 3192.26 oz

## 2021-07-20 MED ORDER — NOREPINEPHRINE 4 MG/250ML-% IV SOLN
2.0000 ug/min | INTRAVENOUS | Status: DC
  Administered 2021-07-20: 2 ug/min via INTRAVENOUS
  Filled 2021-07-20: qty 250

## 2021-07-20 MED ORDER — DOCUSATE SODIUM 50 MG/5ML PO LIQD
100.0000 mg | Freq: Two times a day (BID) | ORAL | Status: DC
  Filled 2021-07-20 (×2): qty 10

## 2021-07-20 MED ORDER — TAMSULOSIN HCL 0.4 MG PO CAPS
0.4000 mg | ORAL_CAPSULE | Freq: Every day | ORAL | Status: DC
  Administered 2021-07-20 – 2021-07-26 (×7): 0.4 mg via ORAL
  Filled 2021-07-20 (×7): qty 1

## 2021-07-20 MED ORDER — PROPOFOL 1000 MG/100ML IV EMUL
5.0000 ug/kg/min | INTRAVENOUS | Status: DC
  Administered 2021-07-20: 5 ug/kg/min via INTRAVENOUS
  Filled 2021-07-20: qty 100

## 2021-07-20 MED ORDER — NICARDIPINE HCL IN NACL 20-0.86 MG/200ML-% IV SOLN
3.0000 mg/h | INTRAVENOUS | Status: DC
  Filled 2021-07-20: qty 200

## 2021-07-20 MED ORDER — SODIUM CHLORIDE 0.9 % IV SOLN: 250.0000 mL | INTRAVENOUS | Status: DC

## 2021-07-20 MED ORDER — FENTANYL CITRATE (PF) 100 MCG/2ML IJ SOLN
25.0000 ug | INTRAMUSCULAR | Status: DC | PRN
  Filled 2021-07-20: qty 2

## 2021-07-20 MED ORDER — FENTANYL CITRATE (PF) 100 MCG/2ML IJ SOLN: 25.0000 ug | INTRAMUSCULAR | Status: DC | PRN

## 2021-07-20 MED ORDER — POLYETHYLENE GLYCOL 3350 17 G PO PACK
17.0000 g | PACK | Freq: Every day | ORAL | Status: DC
  Administered 2021-07-21 – 2021-07-26 (×5): 17 g via ORAL
  Filled 2021-07-20 (×6): qty 1

## 2021-07-20 MED ORDER — DOCUSATE SODIUM 50 MG/5ML PO LIQD
100.0000 mg | Freq: Two times a day (BID) | ORAL | Status: DC
  Administered 2021-07-20 – 2021-07-21 (×3): 100 mg via ORAL
  Filled 2021-07-20 (×4): qty 10

## 2021-07-20 MED ORDER — POLYETHYLENE GLYCOL 3350 17 G PO PACK
17.0000 g | PACK | Freq: Every day | ORAL | Status: DC
  Filled 2021-07-20: qty 1

## 2021-07-20 MED ORDER — PERFLUTREN LIPID MICROSPHERE
1.0000 mL | INTRAVENOUS | Status: DC | PRN
  Administered 2021-07-20: 2 mL via INTRAVENOUS
  Filled 2021-07-20: qty 10

## 2021-07-20 MED ORDER — HEPARIN (PORCINE) 25000 UT/250ML-% IV SOLN
1500.0000 [IU]/h | INTRAVENOUS | Status: DC
  Administered 2021-07-20: 1500 [IU]/h via INTRAVENOUS
  Administered 2021-07-21: 1300 [IU]/h via INTRAVENOUS
  Filled 2021-07-20 (×3): qty 250

## 2021-07-20 NOTE — Progress Notes (Signed)
Orthopedic Tech Progress Note Patient Details:  Roberto Dickson 19-Jul-1941 [id number]  Patient only needed KNEE IMMOBILIZER to keep knee straight until this morning at 8am.  Patient ID: Roberto Dickson, male   DOB: 1941-11-02, 80 y.o.   MRN: [id number]  Roberto Dickson 07/20/2021, 5:07 PM

## 2021-07-20 NOTE — Consult Note (Signed)
NAME:  Roberto Dickson, MRN:  [id number], DOB:  1941-09-28, LOS: 1 ADMISSION DATE:  07/19/2021, CONSULTATION DATE:  07/20/2021 REFERRING MD:  Dr Terrilee Files, CHIEF COMPLAINT:  right upper extrem weakness and aphasia    History of Present Illness:  80 y/o white male that presented to the ER from home.  The pt took a nap from 1700hrs to 1830hrs and woke up with right upper extrem weakness and aphasia.  The pt was a stroke alert and was given Tpa.  The pt then went to IR where they found occluded left ICA.  IR was able to open the left ICA.  The patient has a known history of hypertension but does not follow with a PCP.  No other pertinent past medical history is noted at this time.  Pertinent  Medical History  HTN  Significant Hospital Events: Including procedures, antibiotic start and stop dates in addition to other pertinent events     Objective   Blood pressure 121/73, pulse (!) 53, temperature 97.8 F (36.6 C), temperature source Axillary, resp. rate 14, weight 90.5 kg, SpO2 98 %.    Vent Mode: PRVC FiO2 (%):  [100 %] 100 % Set Rate:  [12 bmp] 12 bmp Vt Set:  [560 mL] 560 mL PEEP:  [6 cmH20] 6 cmH20 Plateau Pressure:  [16 cmH20] 16 cmH20   Intake/Output Summary (Last 24 hours) at 07/20/2021 0049 Last data filed at 07/19/2021 2308 Gross per 24 hour  Intake 1550 ml  Output --  Net 1550 ml   Filed Weights   07/19/21 2000  Weight: 90.5 kg    Examination: General: No acute distress  HENT: Anicteric MMM  Lungs: clear to auscultation No wheezing/ rales/ wheezing Cardiovascular: Regular  rate  no murmur  Abdomen: Soft nondistended  No bowel sounds Extremities: Distal pulses intact x4  No edema or cyanosis Neuro: Unconscious unresponsive.  Still paralyzed/ anesthesia  GU: Foley cath intact   Assessment & Plan:  Acute resp failure  ICA CVA S/p tpa  S/P Angiogram History of hypertension  Plan: Started on standard ventilator protocol.  Patient was left intubated overnight  because of concern for the airway and some mucosal oozing post systemic tPA. Propofol and as needed fentanyl for analgesia and pain control. Patient was given paralytics for the surgical procedure which has not worn off yet. Goal systolic blood pressures between 110 -140.  We will use Cardene for lowering blood pressure and norepinephrine to increase blood pressure to maintain that goal. N.p.o. Protonix for GI prophylaxis. Monitor I's/O's.  Avoid nephrotoxic medications as possible. Continue serial neurochecks   Best Practice (right click and "Reselect all SmartList Selections" daily)   Diet/type: NPO DVT prophylaxis: other GI prophylaxis: PPI Lines: N/A Foley:  N/A Code Status:  full code   Labs   CBC: Recent Labs  Lab 07/19/21 1947 07/19/21 1953  WBC 10.4  --   NEUTROABS 6.6  --   HGB 15.8 16.0  HCT 48.9 47.0  MCV 95.9  --   PLT 272  --     Basic Metabolic Panel: Recent Labs  Lab 07/19/21 1947 07/19/21 1953  NA 140 141  K 3.9 3.9  CL 109 111  CO2 22  --   GLUCOSE 77 76  BUN 25* 30*  CREATININE 1.53* 1.50*  CALCIUM 8.8*  --    GFR: CrCl cannot be calculated (Unknown ideal weight.). Recent Labs  Lab 07/19/21 1947  WBC 10.4    Liver Function Tests: Recent Labs  Lab  07/19/21 1947  AST 23  ALT 15  ALKPHOS 54  BILITOT 1.1  PROT 6.9  ALBUMIN 3.4*   No results for input(s): LIPASE, AMYLASE in the last 168 hours. No results for input(s): AMMONIA in the last 168 hours.  ABG    Component Value Date/Time   TCO2 21 (L) 07/19/2021 1953     Coagulation Profile: Recent Labs  Lab 07/19/21 1947  INR 1.0    Cardiac Enzymes: No results for input(s): CKTOTAL, CKMB, CKMBINDEX, TROPONINI in the last 168 hours.  HbA1C: No results found for: HGBA1C  CBG: Recent Labs  Lab 07/19/21 1948 07/19/21 1951  GLUCAP 28* 77    Review of Systems:   Unable to obtain secondary to neuro status  Past Medical History:  He,  has a past medical history of  Hypertension.   Surgical History:   Past Surgical History:  Procedure Laterality Date   NO PAST SURGERIES       Social History:   reports that he has never smoked. He has never used smokeless tobacco. He reports current alcohol use of about 1.0 standard drink per week.   Family History:  His family history is not on file.   Allergies Not on File   Home Medications  Prior to Admission medications   Not on File     Critical care time: 55 mins

## 2021-07-20 NOTE — Evaluation (Signed)
Physical Therapy Evaluation Patient Details Name: Roberto Dickson MRN: [id number] DOB: 24-Apr-1941 Today's Date: 07/20/2021   History of Present Illness  Pt is a 80 y.o. male who presented 07/19/21 with L gaze deviation, R facial droop, R weakness, and aphasia. tPA was administered. 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 S/p revascularization of L ICA 8/16. ETT 8/16-8/17. PMH: HTN   Clinical Impression  Pt presents with condition above and deficits mentioned below, see PT Problem List. PTA, he was living with his wife in a 2nd floor apartment with an elevator, and he was independent. Currently, pt displays R-sided weakness (primarily in his hand), incoordination, and decreased proprioception. In addition, he has poor deficits, spatial, and safety awareness and impaired balance and activity tolerance. Pt is at high risk for falls, needing modA for bed mobility, transfers, and to take several lateral steps at EOB with UE support this date. Pt would greatly benefit from intensive therapy in the CIR setting to maximize his safety and independence with all functional mobility prior to return home. Will continue to follow acutely.    Follow Up Recommendations CIR;Supervision/Assistance - 24 hour    Equipment Recommendations  Rolling walker with 5" wheels;3in1 (PT)    Recommendations for Other Services Rehab consult     Precautions / Restrictions Precautions Precautions: Fall Precaution Comments: A-line; SBP <140 Restrictions Weight Bearing Restrictions: No      Mobility  Bed Mobility Overal bed mobility: Needs Assistance Bed Mobility: Supine to Sit;Sit to Supine     Supine to sit: Mod assist Sit to supine: Mod assist   General bed mobility comments: Cues provided to manage legs off L EOB, good initiation by pt. Pt pulling to sit up with R UE as A-line in L wrist. ModA to assist legs back onto bed with return to supine.    Transfers Overall transfer level: Needs assistance Equipment  used: 1 person hand held assist Transfers: Sit to/from Stand Sit to Stand: Mod assist         General transfer comment: Pt with posterior bias, needing modA to shift weight anteriorly as he keeps his toes lifted off the ground leaning back on his heels. Feet sliding slightly with transfer to stand also.  Ambulation/Gait Ambulation/Gait assistance: Mod assist Gait Distance (Feet): 4 Feet Assistive device: 1 person hand held assist Gait Pattern/deviations: Step-through pattern;Decreased stride length;Decreased step length - right;Decreased dorsiflexion - right;Leaning posteriorly Gait velocity: reduced Gait velocity interpretation: <1.31 ft/sec, indicative of household ambulator General Gait Details: Pt with posterior lean, needing verbal and tactile cues to place weight through toes rather than heels while standing, momentary success. Poor R foot clearance with each step. ModA to steady pt with UE support while taking small, shuffling lateral steps R and L at EOB.  Stairs            Wheelchair Mobility    Modified Rankin (Stroke Patients Only) Modified Rankin (Stroke Patients Only) Pre-Morbid Rankin Score: No symptoms Modified Rankin: Moderately severe disability     Balance Overall balance assessment: Needs assistance Sitting-balance support: No upper extremity supported;Feet supported Sitting balance-Leahy Scale: Poor Sitting balance - Comments: Pt needing min-modA to sit statically due to posterior and R lateral lean, cues to correct and match body position with therapist anterior to him. Postural control: Right lateral lean;Posterior lean Standing balance support: Bilateral upper extremity supported;During functional activity Standing balance-Leahy Scale: Poor Standing balance comment: Reliant on UE support and physical assistance to stand.  Pertinent Vitals/Pain Pain Assessment: No/denies pain    Home Living Family/patient  expects to be discharged to:: Private residence Living Arrangements: Spouse/significant other Available Help at Discharge: Family;Available 24 hours/day Type of Home: Apartment (2nd floor, elevator) Home Access: Elevator     Home Layout: One level Home Equipment: Grab bars - tub/shower      Prior Function Level of Independence: Independent               Hand Dominance   Dominant Hand: Right    Extremity/Trunk Assessment   Upper Extremity Assessment Upper Extremity Assessment: RUE deficits/detail RUE Deficits / Details: Trace muscle activation for finger movement, no AROM noted; generalized weakness throughout, but able to move shoulder, elbow, and wrist against gravity; denied numbness/tingling; rests with fingers and wirst flexed RUE Sensation: decreased proprioception RUE Coordination: decreased fine motor;decreased gross motor    Lower Extremity Assessment Lower Extremity Assessment: RLE deficits/detail RLE Deficits / Details: Gross weakness MMT scores of 4 to 4+ compared to 4+ to 5 on L; denied numbness/tingling; incoordination noted RLE Sensation: decreased proprioception RLE Coordination: decreased fine motor;decreased gross motor    Cervical / Trunk Assessment Cervical / Trunk Assessment: Normal  Communication   Communication: Prefers language other than English (Spanish)  Cognition Arousal/Alertness: Awake/alert Behavior During Therapy: Flat affect Overall Cognitive Status: Impaired/Different from baseline Area of Impairment: Following commands;Safety/judgement;Awareness;Problem solving                       Following Commands: Follows one step commands inconsistently;Follows one step commands with increased time Safety/Judgement: Decreased awareness of safety;Decreased awareness of deficits Awareness: Emergent Problem Solving: Slow processing;Difficulty sequencing;Requires verbal cues;Requires tactile cues General Comments: Pt with poor deficits  and safety awareness, needing repeated cues to correct his posture and reduce his posterior lean. Poor problem-solving to correct his deviations.      General Comments General comments (skin integrity, edema, etc.): Wife present. Daughter present and providing interpretation throughout session.    Exercises     Assessment/Plan    PT Assessment Patient needs continued PT services  PT Problem List Decreased strength;Decreased range of motion;Decreased activity tolerance;Decreased mobility;Decreased balance;Decreased coordination;Decreased cognition;Decreased knowledge of use of DME;Decreased safety awareness       PT Treatment Interventions DME instruction;Gait training;Functional mobility training;Therapeutic activities;Therapeutic exercise;Balance training;Neuromuscular re-education;Cognitive remediation;Patient/family education    PT Goals (Current goals can be found in the Care Plan section)  Acute Rehab PT Goals Patient Stated Goal: to improve PT Goal Formulation: With patient/family Time For Goal Achievement: 08/03/21 Potential to Achieve Goals: Good    Frequency Min 4X/week   Barriers to discharge        Co-evaluation               AM-PAC PT "6 Clicks" Mobility  Outcome Measure Help needed turning from your back to your side while in a flat bed without using bedrails?: A Lot Help needed moving from lying on your back to sitting on the side of a flat bed without using bedrails?: A Lot Help needed moving to and from a bed to a chair (including a wheelchair)?: A Lot Help needed standing up from a chair using your arms (e.g., wheelchair or bedside chair)?: A Lot Help needed to walk in hospital room?: A Lot Help needed climbing 3-5 steps with a railing? : Total 6 Click Score: 11    End of Session Equipment Utilized During Treatment: Gait belt Activity Tolerance: Patient tolerated treatment well Patient left: in  bed;with call bell/phone within reach;with bed alarm  set;with family/visitor present Nurse Communication: Mobility status PT Visit Diagnosis: Unsteadiness on feet (R26.81);Other abnormalities of gait and mobility (R26.89);Muscle weakness (generalized) (M62.81);Difficulty in walking, not elsewhere classified (R26.2);Other symptoms and signs involving the nervous system (R29.898);Hemiplegia and hemiparesis Hemiplegia - Right/Left: Right Hemiplegia - dominant/non-dominant: Dominant Hemiplegia - caused by: Cerebral infarction    Time: 3545-6256 PT Time Calculation (min) (ACUTE ONLY): 26 min   Charges:   PT Evaluation $PT Eval Moderate Complexity: 1 Mod PT Treatments $Therapeutic Activity: 8-22 mins        Raymond Gurney, PT, DPT Acute Rehabilitation Services  Pager: [phone] Office: [phone]   [name] 07/20/2021, 6:19 PM

## 2021-07-20 NOTE — Progress Notes (Signed)
SLP Cancellation Note  Patient Details Name: Quin Mathenia MRN: [id number] DOB: 1941-08-11   Cancelled treatment:       Reason Eval/Treat Not Completed: Patient not medically ready. Still intubated this am. Will follow for readiness.    Maycel Riffe, Riley Nearing 07/20/2021, 7:30 AM

## 2021-07-20 NOTE — Progress Notes (Signed)
ANTICOAGULATION CONSULT NOTE - Initial Consult  Pharmacy Consult for heparin Indication:  Apical thrombus (recent stroke)  No Known Allergies  Patient Measurements: Weight: 90.5 kg (199 lb 8.3 oz) Heparin Dosing Weight:   Vital Signs: Temp: 98.5 F (36.9 C) (08/17 1600) Temp Source: Axillary (08/17 1200) BP: 113/76 (08/17 1800) Pulse Rate: 88 (08/17 1800)  Labs: Recent Labs    07/19/21 1947 07/19/21 1953 07/20/21 0105 07/20/21 0536  HGB 15.8 16.0 13.9 14.4  HCT 48.9 47.0 41.0 45.3  PLT 272  --   --  245  APTT 34  --   --   --   LABPROT 13.4  --   --   --   INR 1.0  --   --   --   CREATININE 1.53* 1.50*  --  1.74*    CrCl cannot be calculated (Unknown ideal weight.).   Medical History: Past Medical History:  Diagnosis Date   Hypertension     Medications:  Medications Prior to Admission  Medication Sig Dispense Refill Last Dose   enalapril (VASOTEC) 20 MG tablet Take 20 mg by mouth daily.   07/19/2021   hydrochlorothiazide (HYDRODIURIL) 12.5 MG tablet Take 12.5 mg by mouth daily.   07/19/2021   ibuprofen (ADVIL) 200 MG tablet Take 200 mg by mouth every 6 (six) hours as needed for mild pain.   07/17/2021    Assessment: 50 YOM who presented with an ischemic stroke s/p alteplase at 8 PM on 8/16. 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 Pharmacy consulted to start IV heparin per stroke protocol.  H/H and Plt wnl   Goal of Therapy:  Heparin level 0.3-0.5 units/ml Monitor platelets by anticoagulation protocol: Yes   Plan:  -Start heparin 1500 units/hr. No bolus -F/u 8 hr HL -Monitor daily HL, CBC and s/s of bleeding   Vinnie Level, PharmD., BCPS, BCCCP Clinical Pharmacist Please refer to [facility] for unit-specific pharmacist

## 2021-07-20 NOTE — Progress Notes (Signed)
Patient extubated to 3 L [place] .  Able to follow commands and tolerated wean mode.  Good cough, able to speak sat 94%.  Will continue to monitor patient.

## 2021-07-20 NOTE — Progress Notes (Signed)
OT Cancellation Note  Patient Details Name: Roberto Dickson MRN: [id number] DOB: 08-19-1941   Cancelled Treatment:    Reason Eval/Treat Not Completed: Active bedrest order (Intubated.)  [facility] Luisa Dago, OT/L   Acute OT Clinical Specialist Acute Rehabilitation Services Pager [phone] Office [phone]  07/20/2021, 8:36 AM

## 2021-07-20 NOTE — Progress Notes (Signed)
Per Dr. Nicole Cella keep SBP 110s-140s since pt is on pressors to bring up BP.

## 2021-07-20 NOTE — Anesthesia Postprocedure Evaluation (Signed)
Anesthesia Post Note  Patient: Roberto Dickson  Procedure(s) Performed: IR WITH ANESTHESIA     Patient location during evaluation: SICU Anesthesia Type: General Level of consciousness: sedated Pain management: pain level controlled Vital Signs Assessment: post-procedure vital signs reviewed and stable Respiratory status: patient remains intubated per anesthesia plan Cardiovascular status: stable Postop Assessment: no apparent nausea or vomiting Anesthetic complications: no   No notable events documented.  Last Vitals:  Vitals:   07/20/21 0015 07/20/21 0106  BP: 121/73   Pulse: (!) 53 60  Resp: 14 14  Temp:    SpO2: 98% 99%    Last Pain:  Vitals:   07/20/21 0000  TempSrc: Axillary  PainSc:                  Nelle Don Sani Madariaga

## 2021-07-20 NOTE — Progress Notes (Signed)
Referring Physician(s): Dr. Erick Blinks  Supervising Physician: Julieanne Cotton  Patient Status:  Roberto Dickson - In-pt  Chief Complaint: Code Stroke  Subjective: Patient remains intubated this AM.  Grimace on face.  Follows all commands even when given in [place].  Moving all extremities.   Attempts to move RUE, RLE.    Allergies: Patient has no allergy information on record.  Medications: Prior to Admission medications   Not on File     Vital Signs: BP 125/85   Pulse 84   Temp (!) (P) 97.5 F (36.4 C) (Oral)   Resp 19   Wt 199 lb 8.3 oz (90.5 kg)   SpO2 94%   Physical Exam NAD, alert Groin:  Pressure dressing with quikclot in place.  No evidence of hematoma or pseudoaneurysm.  Lightly discoloration/ecchymosis in the medial thigh below the area of the large dressing which is isolated and  does not appear to be related to the procedure site.   Neuro: intubated, sedation has been turned off this AM for possible extubation.  Following commands.  Shows some attempt to move the RUE and RLE.  Moving left side spontaneously.   Imaging: DG CHEST PORT 1 VIEW  Result Date: 07/20/2021 CLINICAL DATA:  Check gastric catheter placement EXAM: PORTABLE CHEST 1 VIEW COMPARISON:  None. FINDINGS: Endotracheal tube is noted in satisfactory position. Cardiac shadow is enlarged. Poor inspiratory effort is noted with bibasilar atelectasis. Gastric catheter is seen within the stomach. No bony abnormality is noted. IMPRESSION: Tubes and lines in satisfactory position. Mild bibasilar atelectatic changes are seen. Electronically Signed   By: Alcide Clever M.D.   On: 07/20/2021 00:29   CT HEAD CODE STROKE WO CONTRAST  Result Date: 07/19/2021 CLINICAL DATA:  Code stroke.  Acute neurologic deficit EXAM: CT HEAD WITHOUT CONTRAST TECHNIQUE: Contiguous axial images were obtained from the base of the skull through the vertex without intravenous contrast. COMPARISON:  None. FINDINGS: Brain: There  is no mass, hemorrhage or extra-axial collection. The size and configuration of the ventricles and extra-axial CSF spaces are normal. 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 Vascular: No abnormal hyperdensity of the major intracranial arteries or dural venous sinuses. No intracranial atherosclerosis. Skull: The visualized skull base, calvarium and extracranial soft tissues are normal. Sinuses/Orbits: No fluid levels or advanced mucosal thickening of the visualized paranasal sinuses. No mastoid or middle ear effusion. The orbits are normal. ASPECTS [facility] Stroke Program Early CT Score) - Ganglionic level infarction (caudate, lentiform nuclei, internal capsule, insula, M1-M3 cortex): 7 - Supraganglionic infarction (M4-M6 cortex): 3 Total score (0-10 with 10 being normal): 10 IMPRESSION: 1. No acute intracranial abnormality. 2. 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 3. ASPECTS is 10. 4. These results were called by telephone at the time of interpretation on 07/19/2021 at 8:00 pm to provider [facility] , who verbally acknowledged these results. Electronically Signed   By: Deatra Robinson M.D.   On: 07/19/2021 20:01   CT ANGIO HEAD NECK W WO CM (CODE STROKE)  Result Date: 07/19/2021 CLINICAL DATA:  Left MCA symptoms EXAM: CT ANGIOGRAPHY HEAD AND NECK TECHNIQUE: Multidetector CT imaging of the head and neck was performed using the standard protocol during bolus administration of intravenous contrast. Multiplanar CT image reconstructions and MIPs were obtained to evaluate the vascular anatomy. Carotid stenosis measurements (when applicable) are obtained utilizing NASCET criteria, using the distal internal carotid diameter as the denominator. CONTRAST:  OMNIPAQUE IOHEXOL 350 MG/ML SOLN COMPARISON:  None. FINDINGS: CTA NECK FINDINGS SKELETON: There is  no bony spinal canal stenosis. No lytic or blastic lesion. OTHER NECK: Normal pharynx, larynx and major salivary glands. No cervical lymphadenopathy. Unremarkable thyroid  gland. UPPER CHEST: No pneumothorax or pleural effusion. No nodules or masses. AORTIC ARCH: There is no calcific atherosclerosis of the aortic arch. There is no aneurysm, dissection or hemodynamically significant stenosis of the visualized portion of the aorta. Conventional 3 vessel aortic branching pattern. The visualized proximal subclavian arteries are widely patent. RIGHT CAROTID SYSTEM: Normal without aneurysm, dissection or stenosis. LEFT CAROTID SYSTEM: The left ICA is occluded at its origin and remains occluded to the carotid terminus. VERTEBRAL ARTERIES: Left dominant configuration. Both origins are clearly patent. There is no dissection, occlusion or flow-limiting stenosis to the skull base (V1-V3 segments). CTA HEAD FINDINGS POSTERIOR CIRCULATION: --Vertebral arteries: Normal V4 segments. --Inferior cerebellar arteries: Normal. --Basilar artery: Normal. --Superior cerebellar arteries: Normal. --Posterior cerebral arteries (PCA): Normal. ANTERIOR CIRCULATION: --Intracranial internal carotid arteries: Normal. --Anterior cerebral arteries (ACA): Normal. Both A1 segments are present. Patent anterior communicating artery (a-comm). --Middle cerebral arteries (MCA): Right MCA is normal. The left MCA is opacified via collateral flow across the anterior communicating artery. VENOUS SINUSES: As permitted by contrast timing, patent. ANATOMIC VARIANTS: None Review of the MIP images confirms the above findings. IMPRESSION: 1. No intracranial arterial occlusion or high-grade stenosis. 2. 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 The left MCA is opacified via collateral flow across the anterior communicating artery. Aortic Atherosclerosis (ICD10-I70.0). Electronically Signed   By: Deatra Robinson M.D.   On: 07/19/2021 20:21    Labs:  CBC: Recent Labs    07/19/21 1947 07/19/21 1953 07/20/21 0105 07/20/21 0536  WBC 10.4  --   --  10.9*  HGB 15.8 16.0 13.9 14.4  HCT  48.9 47.0 41.0 45.3  PLT 272  --   --  245    COAGS: Recent Labs    07/19/21 1947  INR 1.0  APTT 34    BMP: Recent Labs    07/19/21 1947 07/19/21 1953 07/20/21 0105 07/20/21 0536  NA 140 141 142 142  K 3.9 3.9 4.6 4.0  CL 109 111  --  113*  CO2 22  --   --  20*  GLUCOSE 77 76  --  180*  BUN 25* 30*  --  27*  CALCIUM 8.8*  --   --  8.1*  CREATININE 1.53* 1.50*  --  1.74*  GFRNONAA 46*  --   --  40*    LIVER FUNCTION TESTS: Recent Labs    07/19/21 1947  BILITOT 1.1  AST 23  ALT 15  ALKPHOS 54  PROT 6.9  ALBUMIN 3.4*    Assessment and Plan: Occluded Lt ICA extracranially and intracranially and Lt  Lt MCA s/p mechanical thrombectomy and contact aspiration achieving a TICI 3 revascularization  Patient remains intubated during assessement this AM.  Per RN, weaning for likely extubation later this AM.  Following commands.  Attempts to move RUE, able to demonstrate some flexion of the RLE.  Groin stable without evidence of complication after closure device failure. Quikclot to remain in place today.  Note episodes of afib overnight. NIR following.   Electronically Signed: Hoyt Koch, PA 07/20/2021, 11:44 AM   I spent a total of 15 Minutes at the the patient's bedside AND on the patient's hospital floor or unit, greater than 50% of which was counseling/coordinating care for L ICA and MCA occlusion.

## 2021-07-20 NOTE — Progress Notes (Signed)
  Echocardiogram 2D Echocardiogram with contrast has been performed.  Roosvelt Maser F 07/20/2021, 2:51 PM

## 2021-07-20 NOTE — Progress Notes (Signed)
PT Cancellation Note  Patient Details Name: Roberto Dickson MRN: [id number] DOB: [birth year]   Cancelled Treatment:    Reason Eval/Treat Not Completed: Active bedrest order;Patient not medically ready. RN reporting waiting on IR to come assess bleeding with pressure dressing in place. Will follow-up as time permits and as appropriate.   Raymond Gurney, PT, DPT Acute Rehabilitation Services  Pager: [phone] Office: [phone]    [name] 07/20/2021, 8:48 AM

## 2021-07-20 NOTE — Progress Notes (Signed)
RT transported pt from IR to 4N32 w/o complications. RT will cont to monitor.

## 2021-07-20 NOTE — Progress Notes (Addendum)
STROKE TEAM PROGRESS NOTE    Interval History   No acute events overnight, remains intubated with plans to extubate today.   With sedation paused he is able to follow commands and move all extremities.   Informed about stroke, stroke work up and plan to extubate later today.    Pertinent Lab Work and Imaging    07/19/21 CT Head WO IV Contrast 1. No acute intracranial abnormality. 2. 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 3. ASPECTS is 10.  07/19/21 CT Angio Head and Neck W WO IV Contrast 1. No intracranial arterial occlusion or high-grade stenosis. 2. 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 The left MCA is opacified via collateral flow across the anterior communicating artery.  MRI Brain WO IV Contrast Pending   Echocardiogram Complete  Pending   Physical Examination   Constitutional: Anxious appearing, intubated and sedated  Cardiovascular: Normal RR Respiratory: No increased WOB   Mental status: Intubated, nods appropriately to questions, following commands exam slight limited due to language barrier Speech: UTA  Cranial nerves: EOMI, VFF, UTA for facial droop due to ET tube  Motor: Normal bulk and tone. No drift. Antigravity throughout with slight right hand and leg weakness. Sensory: UTA, noxious stimulation deferred  Coordination: Deferred  Gait: Deferred given patient acuity    Assessment and Plan   [name] is a 80 y.o. male w/pmh of HTN who presents with L MCA syndrome, received IVTPA and underwent mechanical thrombectomy.   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 At this time, stroke work up is ongoing. His CTA Head and Neck showed LICA occlusion, no significant atherosclerosis. Initial CTH w/NAICP. MRI Brain, Echocardiogram are pending. Stroke labs were completed including Lipid panel  w/LDL 132 and Hemoglobin A1C 5.9. Stroke etiology is cryptogenic, possibly cardioembolic.  -Initiate Aspirin 81 mg at 2000 today, 24 hours post IVTPA  -Atorvastatin 40 mg for stroke prevention  -PT/OT/ST  -At discharge will place ambulatory referral to neurology for stroke follow up   CARDS #Hypertension He has a history of HTN, does not appear to be taking any medications at home. SBP is trending normotensive at this time. SBP goal is 120-140 post thrombectomy.  - Trend pressures  - Long term blood pressure goal is < 140/90.  #Hyperlipidemia From a stroke prevention stand point, the LDL goal is < 70. LDL is 132, Atorvastatin 40 mg started this admission.   RESP #Respiratory Failure requiring Intubation  Successfully extubated to 3L [place] on 8/17. Pulmonary toilet with OOB to chair.   RENAL #AKI versus CKD  Cr elevated at 1.74, unclear what baseline Cr is. Will trend, encourage PO fluids if he passes bedside swallow evaluation.   GI  Pending bedside swallow post extubation   ENDO #Prediabetes  Hemoglobin A1C this admission noted to be 5.9, in the prediabetic range. Will manage hyperglycemia with SSI if necessary   HEME #DVT Prophylaxis  Hemoglobin hematocrit and platelet count stable.  Using SCDs for Dvt Prophylaxis currently, will initiate Lovenox when he is 24 hours out from IVTPA.   ID #Leukocytosis  Afebrile. No infectious processes at this time. He has a mild leukocytosis at 10.9- are trending   Hospital day # 1  [name], NP  [facility] Nurse Practitioner Patient seen and discussed with attending physician Dr. Priscille Heidelberg MD NOTE :  I have personally obtained history,examined this patient, reviewed notes, independently viewed imaging studies, participated in medical decision making and plan of care.ROS completed by me personally and pertinent positives fully documented  I have made any additions or clarifications directly to the above note. Agree  with note above.  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  Continue close neurological observation with strict blood pressure control assist with blood pressure goal systolic between 767-209 for the first 24 hours.  Check MRI scan later today.  Extubate as tolerated as per CCM team.  Continue ongoing stroke work-up.  No family available at the bedside for discussion.  Discussed with Dr. Corliss Skains and Dr. Lynnell Catalan critical care medicine. This patient is critically ill and at significant risk of neurological worsening, death and care requires constant monitoring of vital signs, hemodynamics,respiratory and cardiac monitoring, extensive review of multiple databases, frequent neurological assessment, discussion with family, other specialists and medical decision making of high complexity.I have made any additions or clarifications directly to the above note.This critical care time does not reflect procedure time, or teaching time or supervisory time of PA/NP/Med Resident etc but could involve care discussion time.  I spent 30 minutes of neurocritical care time  in the care of  this patient.      Delia Heady, MD Medical Director [facility] Pager: [phone] 07/20/2021 3:18 PM   To contact Stroke Continuity provider, please refer to [email or web address]. After hours, contact General Neurology

## 2021-07-21 ENCOUNTER — Inpatient Hospital Stay (HOSPITAL_COMMUNITY): Payer: Medicare Other

## 2021-07-21 DIAGNOSIS — I63512 Cerebral infarction due to unspecified occlusion or stenosis of left middle cerebral artery: Secondary | ICD-10-CM | POA: Diagnosis not present

## 2021-07-21 DIAGNOSIS — J9601 Acute respiratory failure with hypoxia: Secondary | ICD-10-CM | POA: Diagnosis not present

## 2021-07-21 DIAGNOSIS — I724 Aneurysm of artery of lower extremity: Secondary | ICD-10-CM | POA: Diagnosis not present

## 2021-07-21 DIAGNOSIS — I6522 Occlusion and stenosis of left carotid artery: Secondary | ICD-10-CM | POA: Diagnosis not present

## 2021-07-21 DIAGNOSIS — I240 Acute coronary thrombosis not resulting in myocardial infarction: Secondary | ICD-10-CM

## 2021-07-21 HISTORY — PX: IR FLUORO GUIDED NEEDLE PLC ASPIRATION/INJECTION LOC: IMG2395

## 2021-07-21 LAB — URINALYSIS, ROUTINE W REFLEX MICROSCOPIC
Bilirubin Urine: NEGATIVE
Glucose, UA: NEGATIVE mg/dL
Ketones, ur: NEGATIVE mg/dL
Nitrite: NEGATIVE
Protein, ur: NEGATIVE mg/dL
RBC / HPF: >50 RBC/hpf — ABNORMAL HIGH (ref 0–5)
Specific Gravity, Urine: 1.02 (ref 1.005–1.030)
WBC, UA: >50 WBC/hpf — ABNORMAL HIGH (ref 0–5)
pH: 5 (ref 5.0–8.0)

## 2021-07-21 LAB — BASIC METABOLIC PANEL
Anion gap: 16 — ABNORMAL HIGH (ref 5–15)
BUN: 27 mg/dL — ABNORMAL HIGH (ref 8–23)
CO2: 19 mmol/L — ABNORMAL LOW (ref 22–32)
Calcium: 8.4 mg/dL — ABNORMAL LOW (ref 8.9–10.3)
Chloride: 106 mmol/L (ref 98–111)
Creatinine, Ser: 1.7 mg/dL — ABNORMAL HIGH (ref 0.61–1.24)
GFR, Estimated: 41 mL/min — ABNORMAL LOW (ref >60)
Glucose, Bld: 121 mg/dL — ABNORMAL HIGH (ref 70–99)
Potassium: 3.7 mmol/L (ref 3.5–5.1)
Sodium: 141 mmol/L (ref 135–145)

## 2021-07-21 LAB — HEPARIN LEVEL (UNFRACTIONATED)
Heparin Unfractionated: 0.5 IU/mL (ref 0.30–0.70)
Heparin Unfractionated: 0.7 IU/mL (ref 0.30–0.70)
Heparin Unfractionated: <0.1 IU/mL — ABNORMAL LOW (ref 0.30–0.70)

## 2021-07-21 LAB — CBC
HCT: 36.6 % — ABNORMAL LOW (ref 39.0–52.0)
Hemoglobin: 12 g/dL — ABNORMAL LOW (ref 13.0–17.0)
MCH: 31 pg (ref 26.0–34.0)
MCHC: 32.8 g/dL (ref 30.0–36.0)
MCV: 94.6 fL (ref 80.0–100.0)
Platelets: 227 10*3/uL (ref 150–400)
RBC: 3.87 MIL/uL — ABNORMAL LOW (ref 4.22–5.81)
RDW: 14.6 % (ref 11.5–15.5)
WBC: 17.2 10*3/uL — ABNORMAL HIGH (ref 4.0–10.5)
nRBC: 0 % (ref 0.0–0.2)

## 2021-07-21 LAB — TRIGLYCERIDES: Triglycerides: 89 mg/dL (ref <150)

## 2021-07-21 LAB — BRAIN NATRIURETIC PEPTIDE: B Natriuretic Peptide: 82.3 pg/mL (ref 0.0–100.0)

## 2021-07-21 MED ORDER — PANTOPRAZOLE SODIUM 40 MG PO TBEC
40.0000 mg | DELAYED_RELEASE_TABLET | Freq: Every day | ORAL | Status: DC
  Administered 2021-07-21 – 2021-07-26 (×6): 40 mg via ORAL
  Filled 2021-07-21 (×6): qty 1

## 2021-07-21 MED ORDER — ATORVASTATIN CALCIUM 40 MG PO TABS
40.0000 mg | ORAL_TABLET | Freq: Every day | ORAL | Status: DC
  Administered 2021-07-21 – 2021-07-26 (×6): 40 mg via ORAL
  Filled 2021-07-21 (×6): qty 1

## 2021-07-21 MED ORDER — THROMBIN 5000 UNITS EX SOLR
CUTANEOUS | Status: DC | PRN
  Administered 2021-07-21: 5000 [IU] via TOPICAL

## 2021-07-21 MED ORDER — LIDOCAINE HCL 1 % IJ SOLN
INTRAMUSCULAR | Status: AC
  Filled 2021-07-21: qty 20

## 2021-07-21 MED ORDER — FUROSEMIDE 10 MG/ML IJ SOLN
40.0000 mg | Freq: Once | INTRAMUSCULAR | Status: AC
  Administered 2021-07-21: 40 mg via INTRAVENOUS
  Filled 2021-07-21: qty 4

## 2021-07-21 MED ORDER — THROMBIN FOR PERCUTANEOUS TREATMENT OF PSEUDOANEURYSM (5000UNITS/10ML)
Freq: Once | PERCUTANEOUS | Status: AC
  Filled 2021-07-21: qty 1

## 2021-07-21 NOTE — Progress Notes (Signed)
ANTICOAGULATION CONSULT NOTE   Pharmacy Consult for heparin Indication:  Apical thrombus (recent stroke)  No Known Allergies  Patient Measurements: Weight: 90.5 kg (199 lb 8.3 oz) Heparin Dosing Weight:   Vital Signs: Temp: 97.7 F (36.5 C) (08/18 0400) Temp Source: Oral (08/18 0400) BP: 123/76 (08/18 0500) Pulse Rate: 92 (08/18 0500)  Labs: Recent Labs    07/19/21 1947 07/19/21 1953 07/20/21 0105 07/20/21 0536 07/21/21 0432  HGB 15.8 16.0 13.9 14.4 12.0*  HCT 48.9 47.0 41.0 45.3 36.6*  PLT 272  --   --  245 227  APTT 34  --   --   --   --   LABPROT 13.4  --   --   --   --   INR 1.0  --   --   --   --   HEPARINUNFRC  --   --   --   --  0.50  CREATININE 1.53* 1.50*  --  1.74*  --      CrCl cannot be calculated (Unknown ideal weight.).   Medical History: Past Medical History:  Diagnosis Date   Hypertension     Medications:  Medications Prior to Admission  Medication Sig Dispense Refill Last Dose   enalapril (VASOTEC) 20 MG tablet Take 20 mg by mouth daily.   07/19/2021   hydrochlorothiazide (HYDRODIURIL) 12.5 MG tablet Take 12.5 mg by mouth daily.   07/19/2021   ibuprofen (ADVIL) 200 MG tablet Take 200 mg by mouth every 6 (six) hours as needed for mild pain.   07/17/2021    Assessment: 72 YOM who presented with an ischemic stroke s/p alteplase at 8 PM on 8/16. 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 Pharmacy consulted to start IV heparin per stroke protocol.  H/H and Plt wnl   8/18 AM update:  Heparin level therapeutic  Goal of Therapy:  Heparin level 0.3-0.5 units/ml Monitor platelets by anticoagulation protocol: Yes   Plan:  -Cont heparin at 1500 units/hr -Confirmatory heparin level at 1200 -Monitor daily HL, CBC and s/s of bleeding   Abran Duke, PharmD, BCPS Clinical Pharmacist Phone: [phone]

## 2021-07-21 NOTE — Progress Notes (Signed)
OT Cancellation Note  Patient Details Name: Roberto Dickson MRN: [id number] DOB: 1941-01-03   Cancelled Treatment:    Reason Eval/Treat Not Completed: Patient at procedure or test/ unavailable (About to leave for IR to have hematoma repaired.)  Anoop Hemmer M Mahesh Sizemore Kay Shippy MSOT, OTR/L Acute Rehab Pager: [phone] Office: [phone] 07/21/2021, 1:42 PM

## 2021-07-21 NOTE — Progress Notes (Signed)
Referring Physician(s): Dr. Derry Lory  Supervising Physician: Julieanne Cotton  Patient Status:  Roberto Dickson  Chief Complaint: Code Stroke  Subjective: Patient extubated this AM.  Conversing well with use of spanish teleinterpreter.   Complains of numbness in the right hand. No family at bedside.    Allergies: Patient has no known allergies.  Medications: Prior to Admission medications   Medication Sig Start Date End Date Taking? Authorizing Provider  enalapril (VASOTEC) 20 MG tablet Take 20 mg by mouth daily. 06/12/21  Yes [provider]  hydrochlorothiazide (HYDRODIURIL) 12.5 MG tablet Take 12.5 mg by mouth daily. 06/12/21  Yes [provider]  ibuprofen (ADVIL) 200 MG tablet Take 200 mg by mouth every 6 (six) hours as needed for mild pain.   Yes [provider]     Vital Signs: BP 124/77   Pulse 89   Temp 98.2 F (36.8 C) (Oral)   Resp 17   Wt 199 lb 8.3 oz (90.5 kg)   SpO2 94%   Physical Exam Vitals and nursing note reviewed.  Neuro: alert, converses easily with use of Spanish interpreter, EOMs intact, tongue midline, face symmetric.  Moves left side spontaneously.  RUE with some mild weakness, decreased hand grip strength. Reports feeling on lateral hand only. RLE strength intact.  Groin: pressure dressing and Quikclot removed.  Extensive bruising noted without active bleeding.  Ecchymosis extends from lateral hip to medial thigh.  Varying degrees of tenderness with palpation.  Dressing replaced.   Imaging: MR BRAIN WO CONTRAST  Result Date: 07/20/2021 CLINICAL DATA:  Neuro deficit, acute, stroke suspected. Left ICA occlusion. EXAM: MRI HEAD WITHOUT CONTRAST TECHNIQUE: Multiplanar, multiecho pulse sequences of the brain and surrounding structures were obtained without intravenous contrast. COMPARISON:  CT studies done yesterday. FINDINGS: Brain: Diffusion imaging shows fiber 6 punctate infarctions within the left cerebellum, acute  infarction of the left anteromedial hippocampus, and a few scattered infarctions in the posterior frontal and parietal regions on the left. The largest confluent infarction is about 1.5 cm in size and probably affects the precentral gyrus. In the right hemisphere, there are scattered punctate infarctions in the occipital lobe and frontal lobe. No large confluent infarction. No evidence of swelling or acute hemorrhage. There chronic small-vessel ischemic changes of the pons. There are a few older small vessel cerebellar infarctions on the left. There is an old right frontal cortical and subcortical infarction. There is an old left occipital cortical and subcortical infarction with hemosiderin deposition. No evidence of mass, hydrocephalus or extra-axial collection. Vascular: Major vessels at the base of the brain show flow. 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 No large confluent acute infarction. The single largest acute insult is about a cm and a half in size and located in the left posterior frontal region, probably the precentral gyrus. Electronically Signed   By: Paulina Fusi M.D.   On: 07/20/2021 19:28   IR CT Head Ltd  Result Date: 07/21/2021 INDICATION: Acute onset of right-sided weakness, left gaze deviation, and aphasia. 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 EXAM: 1. EMERGENT LARGE VESSEL OCCLUSION THROMBOLYSIS (anterior COMPARISON:  CT angiogram of the head and neck of July 19, 2021. MEDICATIONS: Ancef 2 g IV antibiotic was administered within 1 hour of the procedure. ANESTHESIA/SEDATION: General anesthesia. CONTRAST:  Omnipaque 240 60 mL. FLUOROSCOPY TIME:  Fluoroscopy Time: 38 minutes 18 seconds (1896 mGy).  COMPLICATIONS: None immediate. TECHNIQUE: Following a full explanation of the procedure along with the potential associated complications, an informed witnessed consent was obtained. The risks of intracranial hemorrhage of 10%, worsening neurological deficit, ventilator dependency, death and inability to revascularize were all reviewed in detail with the patient's daughter. The patient was then put under general anesthesia by the Department of Anesthesiology at [facility]. The right groin was prepped and draped in the usual sterile fashion. Thereafter using modified Seldinger technique, transfemoral access into the right common femoral artery was obtained without difficulty. Over a 0.035 inch guidewire an 8 [place] Pinnacle 25 cm sheath was inserted. Through this, and also over a 0.035 inch guidewire a 5 Simmons 2 catheter was advanced to the aortic arch region and selectively positioned in left common carotid artery. FINDINGS: The left common carotid arteriogram demonstrates opacification of the left external carotid artery and its major branches to be grossly unremarkable. The left internal carotid artery at the bulb and just distally demonstrates stasis of contrast. No evidence of a more distal angiographic string sign. No distal reconstitution of the left ICA noted from the external carotid artery branches via the ophthalmic artery. PROCEDURE: The Simmons 2 diagnostic catheter was then exchanged over a 0.035 inch 300 cm Rosen exchange guidewire for an 087 balloon guide catheter which was positioned in the left internal carotid artery bulb region. Gentle control arteriogram performed through the balloon guide catheter demonstrated continuous stasis of contrast in the left internal carotid artery at the bulb and just distal to this. Over a 0.014 inch standard Synchro micro guidewire with a J-tip configuration, an 021 162 cm microcatheter inside of an 132 cm 71 Zoom aspiration catheter combination was  advanced to the supraclinoid left ICA and then the left middle cerebral artery M2 M3 region followed by the microcatheter. The guidewire was removed. Good aspiration obtained from the hub of the microcatheter. A gentle control arteriogram performed through the microcatheter demonstrated safe position of the tip of the microcatheter with slow antegrade clearance of contrast. This was then connected to continuous heparinized saline infusion. A 6 mm x 40 mm Solitaire X retrieval device was then advanced to the distal end of the microcatheter and deployed in the usual manner such that the proximal portion was in the supraclinoid left ICA. The Zoom aspiration catheter was then advanced into the left middle cerebral artery proximally. Thereafter with proximal flow arrest in the left internal carotid artery, and constant aspiration applied at the hub of the balloon guide catheter with a 20 mL syringe, and at the hub of the Zoom aspiration catheter with Penumbra aspiration apparatus for approximately 2-1/2 minutes, the combination of the retrieval device, microcatheter, and the Zoom aspiration catheter were retrieved and removed. Following reversal of flow arrest, a control arteriogram performed through the balloon guide catheter in the proximal left ICA demonstrated revascularization of the left internal carotid artery proximally and distally. The left middle cerebral artery demonstrated wide patency achieving a TICI 3 revascularization. Also noted was opacification of the left anterior cerebral artery into the capillary and venous phases. Cross filling via the anterior communicating artery of the right anterior cerebral A2 segment, and distal right A1 segment was noted. The balloon guide catheter was then retrieved into the left common carotid artery. A control arteriogram performed from this site demonstrated wide patency of the left internal carotid artery proximally and distally with  no change in the complete  revascularization of the left middle cerebral distribution. Balloon guide was then removed. The 8 French Pinnacle sheath was removed with manual compression held for 30 minutes with a quick clot for hemostasis. Distal pulses remained Dopplerable in both feet unchanged. A CT of the brain demonstrated no evidence of intracranial hemorrhage or mass effect. Patient was left intubated due to difficulty with communication due to language barrier prior to the procedure, and awaiting the results of the COVID-19 test. Patient was then transferred to the neuro ICU for post thrombectomy management. IMPRESSION: Status post endovascular complete revascularization of occluded left internal carotid artery proximally and distally to the terminus and the origin of the left middle cerebral artery with 1 pass with a 6 mm x 40 mm Solitaire X retrieval device, and contact aspiration achieving a TICI 3 revascularization. PLAN: Follow-up as per referring MD. Electronically Signed   By: Julieanne CottonSanjeev  Deveshwar M.D.   On: 07/20/2021 09:43   DG CHEST PORT 1 VIEW  Result Date: 07/20/2021 CLINICAL DATA:  Check gastric catheter placement EXAM: PORTABLE CHEST 1 VIEW COMPARISON:  None. FINDINGS: Endotracheal tube is noted in satisfactory position. Cardiac shadow is enlarged. Poor inspiratory effort is noted with bibasilar atelectasis. Gastric catheter is seen within the stomach. No bony abnormality is noted. IMPRESSION: Tubes and lines in satisfactory position. Mild bibasilar atelectatic changes are seen. Electronically Signed   By: Alcide CleverMark  Lukens M.D.   On: 07/20/2021 00:29   ECHOCARDIOGRAM COMPLETE  Result Date: 07/20/2021    ECHOCARDIOGRAM REPORT   Patient Name:   Roberto Dickson Date of Exam: 07/20/2021 Medical Rec #:  [id number]         Height: Accession #:    [id number]        Weight:       199.5 lb Date of Birth:  [birth year]         BSA:          2.016 m Patient Age:    80 years          BP:           106/79 mmHg Patient Gender: M                  HR:           83 bpm. Exam Location:  Inpatient Procedure: 2D Echo, Cardiac Doppler, Color Doppler and Intracardiac            Opacification Agent Indications:    Stroke  History:        Patient has no prior history of Echocardiogram examinations.  Sonographer:    Roosvelt MaserRachel Lane RDCS Referring Phys: [id number] [facility] Inc[facility]MAN KHALIQDINA  Sonographer Comments: Global longitudinal strain was attempted. IMPRESSIONS  1. 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 Apical, apical anterior, and apical septal akinesis. Inferoseptal hypokinesis. Left ventricular ejection fraction, by estimation, is 50 to 55%. The left ventricle has low normal function. The left ventricle demonstrates regional wall motion abnormalities (see scoring diagram/findings for description). Left ventricular diastolic parameters are consistent with Grade I diastolic dysfunction (impaired relaxation).  2. Right ventricular systolic function is normal. The right ventricular size is normal.  3. The mitral valve is normal in structure. Trivial mitral valve regurgitation. No evidence of mitral stenosis.  4. The aortic valve is normal in structure. Aortic valve regurgitation is not visualized. No aortic stenosis is present.  5. The inferior vena cava is normal in size with greater than 50% respiratory variability,  suggesting right atrial pressure of 3 mmHg. FINDINGS  Left Ventricle: Apical thrombus measuring 2.17 cm x 1.67 cm. Apical, apical anterior, and apical septal akinesis. Inferoseptal hypokinesis. Left ventricular ejection fraction, by estimation, is 50 to 55%. The left ventricle has low normal function. The left ventricle demonstrates regional wall motion abnormalities. Definity contrast agent was given IV to delineate the left ventricular endocardial borders. The left ventricular internal cavity size was normal in size. There is no left ventricular hypertrophy. Left ventricular diastolic parameters are consistent with Grade I diastolic dysfunction  (impaired relaxation). Normal left ventricular filling pressure. Right Ventricle: The right ventricular size is normal. No increase in right ventricular wall thickness. Right ventricular systolic function is normal. Left Atrium: Left atrial size was normal in size. Right Atrium: Right atrial size was normal in size. Pericardium: There is no evidence of pericardial effusion. Mitral Valve: The mitral valve is normal in structure. Trivial mitral valve regurgitation. No evidence of mitral valve stenosis. Tricuspid Valve: The tricuspid valve is normal in structure. Tricuspid valve regurgitation is not demonstrated. No evidence of tricuspid stenosis. Aortic Valve: The aortic valve is normal in structure. Aortic valve regurgitation is not visualized. No aortic stenosis is present. Pulmonic Valve: The pulmonic valve was normal in structure. Pulmonic valve regurgitation is not visualized. No evidence of pulmonic stenosis. Aorta: The aortic root is normal in size and structure. Venous: The inferior vena cava is normal in size with greater than 50% respiratory variability, suggesting right atrial pressure of 3 mmHg. IAS/Shunts: No atrial level shunt detected by color flow Doppler.   LV Volumes (MOD) LV vol s, MOD A4C: 76.1 ml Diastology                            LV e' medial:    3.15 cm/s                            LV E/e' medial:  9.5                            LV e' lateral:   6.31 cm/s                            LV E/e' lateral: 4.8  LEFT ATRIUM           Index LA Vol (A4C): 44.3 ml 21.97 ml/m  MITRAL VALVE MV Area (PHT): 4.19 cm MV Decel Time: 181 msec MV E velocity: 30.00 cm/s MV A velocity: 75.40 cm/s MV E/A ratio:  0.40 Chilton Si MD Electronically signed by Chilton Si MD Signature Date/Time: 07/20/2021/4:46:12 PM    Final    IR PERCUTANEOUS ART THROMBECTOMY/INFUSION INTRACRANIAL INC DIAG ANGIO  Result Date: 07/21/2021 INDICATION: Acute onset of right-sided weakness, left gaze deviation, and aphasia.  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 EXAM: 1. EMERGENT LARGE VESSEL OCCLUSION THROMBOLYSIS (anterior COMPARISON:  CT angiogram of the head and neck of July 19, 2021. MEDICATIONS: Ancef 2 g IV antibiotic was administered within 1 hour of the procedure. ANESTHESIA/SEDATION: General anesthesia. CONTRAST:  Omnipaque 240 60 mL. FLUOROSCOPY TIME:  Fluoroscopy Time: 38 minutes 18 seconds (1896 mGy). COMPLICATIONS: None immediate. TECHNIQUE: Following a full explanation of the procedure along with the potential associated complications, an informed  witnessed consent was obtained. The risks of intracranial hemorrhage of 10%, worsening neurological deficit, ventilator dependency, death and inability to revascularize were all reviewed in detail with the patient's daughter. The patient was then put under general anesthesia by the Department of Anesthesiology at [facility]. The right groin was prepped and draped in the usual sterile fashion. Thereafter using modified Seldinger technique, transfemoral access into the right common femoral artery was obtained without difficulty. Over a 0.035 inch guidewire an 8 [place] Pinnacle 25 cm sheath was inserted. Through this, and also over a 0.035 inch guidewire a 5 Simmons 2 catheter was advanced to the aortic arch region and selectively positioned in left common carotid artery. FINDINGS: The left common carotid arteriogram demonstrates opacification of the left external carotid artery and its major branches to be grossly unremarkable. The left internal carotid artery at the bulb and just distally demonstrates stasis of contrast. No evidence of a more distal angiographic string sign. No distal reconstitution of the left ICA noted from the external carotid artery branches via the ophthalmic artery. PROCEDURE: The Simmons 2 diagnostic catheter was then exchanged over a 0.035 inch 300 cm  Rosen exchange guidewire for an 087 balloon guide catheter which was positioned in the left internal carotid artery bulb region. Gentle control arteriogram performed through the balloon guide catheter demonstrated continuous stasis of contrast in the left internal carotid artery at the bulb and just distal to this. Over a 0.014 inch standard Synchro micro guidewire with a J-tip configuration, an 021 162 cm microcatheter inside of an 132 cm 71 Zoom aspiration catheter combination was advanced to the supraclinoid left ICA and then the left middle cerebral artery M2 M3 region followed by the microcatheter. The guidewire was removed. Good aspiration obtained from the hub of the microcatheter. A gentle control arteriogram performed through the microcatheter demonstrated safe position of the tip of the microcatheter with slow antegrade clearance of contrast. This was then connected to continuous heparinized saline infusion. A 6 mm x 40 mm Solitaire X retrieval device was then advanced to the distal end of the microcatheter and deployed in the usual manner such that the proximal portion was in the supraclinoid left ICA. The Zoom aspiration catheter was then advanced into the left middle cerebral artery proximally. Thereafter with proximal flow arrest in the left internal carotid artery, and constant aspiration applied at the hub of the balloon guide catheter with a 20 mL syringe, and at the hub of the Zoom aspiration catheter with Penumbra aspiration apparatus for approximately 2-1/2 minutes, the combination of the retrieval device, microcatheter, and the Zoom aspiration catheter were retrieved and removed. Following reversal of flow arrest, a control arteriogram performed through the balloon guide catheter in the proximal left ICA demonstrated revascularization of the left internal carotid artery proximally and distally. The left middle cerebral artery demonstrated wide patency achieving a TICI 3 revascularization. Also  noted was opacification of the left anterior cerebral artery into the capillary and venous phases. Cross filling via the anterior communicating artery of the right anterior cerebral A2 segment, and distal right A1 segment was noted. The balloon guide catheter was then retrieved into the left common carotid artery. A control arteriogram performed from this site demonstrated wide patency of the left internal carotid artery proximally and distally with no change in the complete revascularization of the left middle cerebral distribution. Balloon guide was then removed. The 8 French Pinnacle sheath was removed with manual compression held for 30 minutes with a quick  clot for hemostasis. Distal pulses remained Dopplerable in both feet unchanged. A CT of the brain demonstrated no evidence of intracranial hemorrhage or mass effect. Patient was left intubated due to difficulty with communication due to language barrier prior to the procedure, and awaiting the results of the COVID-19 test. Patient was then transferred to the neuro ICU for post thrombectomy management. IMPRESSION: Status post endovascular complete revascularization of occluded left internal carotid artery proximally and distally to the terminus and the origin of the left middle cerebral artery with 1 pass with a 6 mm x 40 mm Solitaire X retrieval device, and contact aspiration achieving a TICI 3 revascularization. PLAN: Follow-up as per referring MD. Electronically Signed   By: Julieanne Cotton M.D.   On: 07/20/2021 09:43   CT HEAD CODE STROKE WO CONTRAST  Result Date: 07/19/2021 CLINICAL DATA:  Code stroke.  Acute neurologic deficit EXAM: CT HEAD WITHOUT CONTRAST TECHNIQUE: Contiguous axial images were obtained from the base of the skull through the vertex without intravenous contrast. COMPARISON:  None. FINDINGS: Brain: There is no mass, hemorrhage or extra-axial collection. The size and configuration of the ventricles and extra-axial CSF spaces are  normal. 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 Vascular: No abnormal hyperdensity of the major intracranial arteries or dural venous sinuses. No intracranial atherosclerosis. Skull: The visualized skull base, calvarium and extracranial soft tissues are normal. Sinuses/Orbits: No fluid levels or advanced mucosal thickening of the visualized paranasal sinuses. No mastoid or middle ear effusion. The orbits are normal. ASPECTS [place] Stroke Program Early CT Score) - Ganglionic level infarction (caudate, lentiform nuclei, internal capsule, insula, M1-M3 cortex): 7 - Supraganglionic infarction (M4-M6 cortex): 3 Total score (0-10 with 10 being normal): 10 IMPRESSION: 1. No acute intracranial abnormality. 2. 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 3. ASPECTS is 10. 4. These results were called by telephone at the time of interpretation on 07/19/2021 at 8:00 pm to provider SAL [facility] , who verbally acknowledged these results. Electronically Signed   By: Deatra Robinson M.D.   On: 07/19/2021 20:01   CT ANGIO HEAD NECK W WO CM (CODE STROKE)  Result Date: 07/19/2021 CLINICAL DATA:  Left MCA symptoms EXAM: CT ANGIOGRAPHY HEAD AND NECK TECHNIQUE: Multidetector CT imaging of the head and neck was performed using the standard protocol during bolus administration of intravenous contrast. Multiplanar CT image reconstructions and MIPs were obtained to evaluate the vascular anatomy. Carotid stenosis measurements (when applicable) are obtained utilizing NASCET criteria, using the distal internal carotid diameter as the denominator. CONTRAST:  OMNIPAQUE IOHEXOL 350 MG/ML SOLN COMPARISON:  None. FINDINGS: CTA NECK FINDINGS SKELETON: There is no bony spinal canal stenosis. No lytic or blastic lesion. OTHER NECK: Normal pharynx, larynx and major salivary glands. No cervical lymphadenopathy. Unremarkable thyroid gland. UPPER CHEST: No pneumothorax or pleural effusion. No nodules or masses. AORTIC ARCH: There is no calcific  atherosclerosis of the aortic arch. There is no aneurysm, dissection or hemodynamically significant stenosis of the visualized portion of the aorta. Conventional 3 vessel aortic branching pattern. The visualized proximal subclavian arteries are widely patent. RIGHT CAROTID SYSTEM: Normal without aneurysm, dissection or stenosis. LEFT CAROTID SYSTEM: The left ICA is occluded at its origin and remains occluded to the carotid terminus. VERTEBRAL ARTERIES: Left dominant configuration. Both origins are clearly patent. There is no dissection, occlusion or flow-limiting stenosis to the skull base (V1-V3 segments). CTA HEAD FINDINGS POSTERIOR CIRCULATION: --Vertebral arteries: Normal V4 segments. --Inferior cerebellar arteries: Normal. --Basilar artery: Normal. --Superior cerebellar arteries: Normal. --Posterior cerebral arteries (  PCA): Normal. ANTERIOR CIRCULATION: --Intracranial internal carotid arteries: Normal. --Anterior cerebral arteries (ACA): Normal. Both A1 segments are present. Patent anterior communicating artery (a-comm). --Middle cerebral arteries (MCA): Right MCA is normal. The left MCA is opacified via collateral flow across the anterior communicating artery. VENOUS SINUSES: As permitted by contrast timing, patent. ANATOMIC VARIANTS: None Review of the MIP images confirms the above findings. IMPRESSION: 1. No intracranial arterial occlusion or high-grade stenosis. 2. 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 The left MCA is opacified via collateral flow across the anterior communicating artery. Aortic Atherosclerosis (ICD10-I70.0). Electronically Signed   By: Deatra Robinson M.D.   On: 07/19/2021 20:21    Labs:  CBC: Recent Labs    07/19/21 1947 07/19/21 1953 07/20/21 0105 07/20/21 0536 07/21/21 0432  WBC 10.4  --   --  10.9* 17.2*  HGB 15.8 16.0 13.9 14.4 12.0*  HCT 48.9 47.0 41.0 45.3 36.6*  PLT 272  --   --  245 227    COAGS: Recent Labs     07/19/21 1947  INR 1.0  APTT 34    BMP: Recent Labs    07/19/21 1947 07/19/21 1953 07/20/21 0105 07/20/21 0536 07/21/21 0432  NA 140 141 142 142 141  K 3.9 3.9 4.6 4.0 3.7  CL 109 111  --  113* 106  CO2 22  --   --  20* 19*  GLUCOSE 77 76  --  180* 121*  BUN 25* 30*  --  27* 27*  CALCIUM 8.8*  --   --  8.1* 8.4*  CREATININE 1.53* 1.50*  --  1.74* 1.70*  GFRNONAA 46*  --   --  40* 41*    LIVER FUNCTION TESTS: Recent Labs    07/19/21 1947  BILITOT 1.1  AST 23  ALT 15  ALKPHOS 54  PROT 6.9  ALBUMIN 3.4*    Assessment and Plan: Occluded Lt ICA extracranially and intracranially and Lt  Lt MCA s/p mechanical thrombectomy and contact aspiration achieving a TICI 3 revascularization  Patient extubated.  Following commands and conversing.  Improved movement in the RUE, now with decreased hand grip strength and complaint of numbness.   Groin with bruising and generalized tenderness.  Will order vascular US for today to ensure no pseudoaneurysm.  No active bleeding or palpable lumps.  NIR following.   Electronically Signed: Hoyt Koch, PA 07/21/2021, 10:35 AM   I spent a total of 25 Minutes at the the patient's bedside AND on the patient's hospital floor or unit, greater than 50% of which was counseling/coordinating care for L ICA, MCA occlusion.

## 2021-07-21 NOTE — Progress Notes (Signed)
ANTICOAGULATION CONSULT NOTE   Pharmacy Consult for heparin Indication:  Apical thrombus (recent stroke)  No Known Allergies  Patient Measurements: Weight: 90.5 kg (199 lb 8.3 oz) Heparin Dosing Weight:   Vital Signs: Temp: 98.2 F (36.8 C) (08/18 0724) Temp Source: Oral (08/18 0724) BP: 124/77 (08/18 0600) Pulse Rate: 89 (08/18 0600)  Labs: Recent Labs    07/19/21 1947 07/19/21 1953 07/20/21 0105 07/20/21 0536 07/21/21 0432 07/21/21 1158  HGB 15.8 16.0 13.9 14.4 12.0*  --   HCT 48.9 47.0 41.0 45.3 36.6*  --   PLT 272  --   --  245 227  --   APTT 34  --   --   --   --   --   LABPROT 13.4  --   --   --   --   --   INR 1.0  --   --   --   --   --   HEPARINUNFRC  --   --   --   --  0.50 0.70  CREATININE 1.53* 1.50*  --  1.74* 1.70*  --      CrCl cannot be calculated (Unknown ideal weight.).   Medical History: Past Medical History:  Diagnosis Date   Hypertension     Medications:  Medications Prior to Admission  Medication Sig Dispense Refill Last Dose   enalapril (VASOTEC) 20 MG tablet Take 20 mg by mouth daily.   07/19/2021   hydrochlorothiazide (HYDRODIURIL) 12.5 MG tablet Take 12.5 mg by mouth daily.   07/19/2021   ibuprofen (ADVIL) 200 MG tablet Take 200 mg by mouth every 6 (six) hours as needed for mild pain.   07/17/2021    Assessment: 25 YOM who presented with an ischemic stroke s/p alteplase at 2000 on 8/16. 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 Pharmacy consulted to start IV heparin per stroke protocol.   Confirmatory heparin level high for lower goal at 0.7 this afternoon; however, heparin drip has been turned off for IR thrombin injection procedure for partially thrombosed pseudoaneurysm. Will plan to resume heparin drip post-procedure at lower rate per IR recommendations. CBC wnl  Goal of Therapy:  Heparin level 0.3-0.5 units/ml Monitor platelets by anticoagulation protocol: Yes   Plan:  F/u resume heparin drip post-IR procedure at lower rate  1300 units/hr Check 8hr heparin level from resumption Monitor daily CBC, s/sx bleeding   Leia Alf, PharmD, BCPS Please check AMION for all [facility] contact numbers Clinical Pharmacist 07/21/2021 1:44 PM

## 2021-07-21 NOTE — ED Notes (Addendum)
Pt arrived to IR suite. 

## 2021-07-21 NOTE — Procedures (Signed)
Interventional Radiology Procedure Note  Procedure:   US guided thrombin injection of right CFA pseudoaneurysm.  ~4000 units.  Findings: Right AT and PT pulses palpable and doppler before and after injection.   The PSA had enlarged since the recent vascular study. ~3cm now.    Complications: None  Recommendations:  - Pressure dressing in place until tomorrow. - Repeat limited duplex tomorrow - Do not submerge for 7 days    Signed,  Yvone Neu. Loreta Ave, DO

## 2021-07-21 NOTE — Progress Notes (Signed)
NAME:  Roberto Dickson, MRN:  [id number], DOB:  Oct 07, 1941, LOS: 2 ADMISSION DATE:  07/19/2021, CONSULTATION DATE:  07/20/2021 REFERRING MD:  Dr. Terrilee Files , CHIEF COMPLAINT:  Right upper extremities weakness and aphasia  History of Present Illness:  80 y/o white male that presented to the ER from home.  The pt took a nap from 1700hrs to 1830hrs and woke up with right upper extrem weakness and aphasia.  The pt was a stroke alert and was given Tpa.  The pt then went to IR where they found occluded left ICA.  IR was able to open the left ICA.  The patient has a known history of hypertension but does not follow with a PCP.  No other pertinent past medical history is noted at this time.  Pertinent  Medical History  HTN  Significant Hospital Events: Including procedures, antibiotic start and stop dates in addition to other pertinent events   8/16 Suspected left MCA ischemic stroke with L ICA occlusion at origin up to terminus, tPA given and mechanical thrombectomy. Standard ventilator protocol. 8/17 Extubated to 3L [place]. Echo with apical thrombus and evidence of apical hypokinese with EF 50-55%. Started on heparin   Interim History / Subjective:  Wife at bedside. Patient states he is doing fine this morning. S/p extubation on 8/17. On 4L [place] S[place] 94%  Objective   Blood pressure 124/77, pulse 89, temperature 98.2 F (36.8 C), temperature source Oral, resp. rate 17, weight 90.5 kg, SpO2 94 %.        Intake/Output Summary (Last 24 hours) at 07/21/2021 1231 Last data filed at 07/21/2021 0600 Gross per 24 hour  Intake 1175.65 ml  Output 550 ml  Net 625.65 ml   Filed Weights   07/19/21 2000  Weight: 90.5 kg    Examination: General: Elderly male, lying in bed in no acute distress. Conversational.  HENT: NCAT, MMM. Lungs: crackles at right base. Normal work of breathing.  Cardiovascular: Regular rate and rhythm, no murmur or gallops.  Abdomen: Soft non distended, nontender.  Extremities: No  peripheral edema. Movement of all extremities.  Neuro: 5/5 left hand grip, 3/5 right hand grip, 5/5 LLE, 3/5 RLE with difficulty lifting against resistance. Sensation intact throughout all extremities.    Resolved Hospital Problem list     Assessment & Plan:  Hypoxia with new O2 requirement: Likely related to atelectasis seen on CXR vs pulmonary edema with crackles and echo showing diastolic heart failure.  -Will trial diuresis  -Repeat CXR.  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 Apical, apical anterior, and apical septal akinesis. Inferoseptal hypokinesis. EF 50 to 55%. -continue heparin as ordered  HTN: Hx of HTN not on medication. Normotensive at this time. Per neurology, SBP goal 120-140 s/p thrombectomy.  -Monitor BP  AKI versus CKD Cr peaked at 1.74, unclear Cr baseline due to minimal outpatient follow up.  -Trend renal function -Strict I/Os  Leukocytosis: WBC 17.2, no obvious signs or symptoms of infection. Afebrile. -Trend CBC  Best Practice (right click and "Reselect all SmartList Selections" daily)   Diet/type: clear liquids thin liquids DVT prophylaxis: systemic heparin GI prophylaxis: PPI Lines: yes and it is still needed Two left peripheral lines Foley:  Yes, and it is still needed Code Status:  full code Last date of multidisciplinary goals of care discussion: wife at bedside for  updates.   Labs   CBC: Recent Labs  Lab 07/19/21 1947 07/19/21 1953 07/20/21 0105 07/20/21 0536 07/21/21 0432  WBC 10.4  --   --  10.9* 17.2*  NEUTROABS 6.6  --   --  10.1*  --   HGB 15.8 16.0 13.9 14.4 12.0*  HCT 48.9 47.0 41.0 45.3 36.6*  MCV 95.9  --   --  96.8 94.6  PLT 272  --   --  245 227    Basic Metabolic Panel: Recent Labs  Lab 07/19/21 1947 07/19/21 1953 07/20/21 0105 07/20/21 0536  07/21/21 0432  NA 140 141 142 142 141  K 3.9 3.9 4.6 4.0 3.7  CL 109 111  --  113* 106  CO2 22  --   --  20* 19*  GLUCOSE 77 76  --  180* 121*  BUN 25* 30*  --  27* 27*  CREATININE 1.53* 1.50*  --  1.74* 1.70*  CALCIUM 8.8*  --   --  8.1* 8.4*   GFR: CrCl cannot be calculated (Unknown ideal weight.). Recent Labs  Lab 07/19/21 1947 07/20/21 0536 07/21/21 0432  WBC 10.4 10.9* 17.2*    Liver Function Tests: Recent Labs  Lab 07/19/21 1947  AST 23  ALT 15  ALKPHOS 54  BILITOT 1.1  PROT 6.9  ALBUMIN 3.4*   No results for input(s): LIPASE, AMYLASE in the last 168 hours. No results for input(s): AMMONIA in the last 168 hours.  ABG    Component Value Date/Time   PHART 7.388 07/20/2021 0105   PCO2ART 41.4 07/20/2021 0105   PO2ART 393 (H) 07/20/2021 0105   HCO3 25.0 07/20/2021 0105   TCO2 26 07/20/2021 0105   O2SAT 100.0 07/20/2021 0105     Coagulation Profile: Recent Labs  Lab 07/19/21 1947  INR 1.0    Cardiac Enzymes: No results for input(s): CKTOTAL, CKMB, CKMBINDEX, TROPONINI in the last 168 hours.  HbA1C: Hgb A1c MFr Bld  Date/Time Value Ref Range Status  07/20/2021 05:36 AM 5.9 (H) 4.8 - 5.6 % Final    Comment:    (NOTE) Pre diabetes:          5.7%-6.4%  Diabetes:              >6.4%  Glycemic control for   <7.0% adults with diabetes     CBG: Recent Labs  Lab 07/19/21 1948 07/19/21 1951  GLUCAP 28* 77    Review of Systems:   Negative for cough, fever, increased urinary frequency, dysuria.   Past Medical History:  He,  has a past medical history of Hypertension.   Surgical History:   Past Surgical History:  Procedure Laterality Date   IR CT HEAD LTD  07/19/2021   IR PERCUTANEOUS ART THROMBECTOMY/INFUSION INTRACRANIAL INC DIAG ANGIO  07/19/2021   NO PAST SURGERIES     RADIOLOGY WITH ANESTHESIA N/A 07/19/2021   Procedure: IR WITH ANESTHESIA;  Surgeon: Julieanne Cotton, MD;  Location: [facility] OR;  Service: Radiology;  Laterality: N/A;      Social History:   reports that he has never smoked. He has never used smokeless tobacco. He reports current alcohol use of about 1.0 standard drink per week.   Family History:  His family history is not on file.   Allergies No Known Allergies   Home Medications  Prior to Admission medications   Medication Sig Start Date End Date Taking? Authorizing Provider  enalapril (VASOTEC) 20 MG tablet Take 20 mg by mouth daily. 06/12/21  Yes [provider]  hydrochlorothiazide (HYDRODIURIL) 12.5 MG tablet Take 12.5 mg by mouth daily. 06/12/21  Yes [provider]  ibuprofen (ADVIL) 200 MG tablet Take 200 mg by mouth every 6 (six) hours as needed for mild pain.   Yes [provider]     Critical care time: 35 minutes

## 2021-07-21 NOTE — Progress Notes (Signed)
VASCULAR LAB    Ultrasound of right groin to evaluate for pseudoaneurysm has been performed.  See CV proc for preliminary results.   Aleysha Meckler, RVT 07/21/2021, 11:48 AM

## 2021-07-21 NOTE — Progress Notes (Signed)
? ?  Inpatient Rehab Admissions Coordinator : ? ?Per therapy recommendations, patient was screened for CIR candidacy by Nashon Erbes RN MSN.  At this time patient appears to be a potential candidate for CIR. I will place a rehab consult per protocol for full assessment. Please call me with any questions. ? ?Britania Shreeve RN MSN ?Admissions Coordinator ?[phone] ?  ?

## 2021-07-21 NOTE — Progress Notes (Signed)
Interventional Radiology Brief Note:  Vascular US of right groin shows a small, partially thrombosed pseudoaneurysm.  Discussed with Dr. Loreta Ave who is agreeable to thrombin injection today.  Discussed with patient and family at bedside.  Explained findings and recommendation for treatment.  Discussed risks and benefits of thrombin injection including the need for retreatment and embolic stroke. Patient and family agreeable to proceed.   No sedation planned.  May eat/drink light foods for now.  Hold heparin drip, may resume post-procedure.   Loyce Dys, MS RD PA-C 1:35 PM

## 2021-07-21 NOTE — Progress Notes (Addendum)
Pt with new bruising/possible bleeding on bilateral inner thighs. Pt reporting pain at site with palpation. MD notified Pearlean Brownie) and was instructed to stop heparin gtt at this time. He stated he would contact neurosurgery to assess. Vital signs remain stable at this time.The area and size was traced with skin marker.  Family at bedside. Will continue to monitor.

## 2021-07-21 NOTE — Consult Note (Addendum)
Cardiology Consultation:   Patient ID: Roberto Dickson: [id number]; DOB: Sep 17, 1941  Admit date: 07/19/2021 Date of Consult: 07/21/2021  PCP:  Oneita Hurt No   [facility] Providers Cardiologist:  New- Dr. Cristal Deer  Patient Profile:   Roberto Dickson is a 80 y.o. male with a hx of HTN with no regular medical care who was admitted for stroke who is being seen 07/21/2021 for the evaluation of LV thrombus at the request of CCM.  History of Present Illness:   Roberto Dickson was admitted 07/19/21 for stroke. He woke up from a nap and could not get out of bed and was unable to talk. Wife and daughter called EMS. EMS noted L gaze deviation, R facial droop RUE weakness and aphasia. He was brought in as CODE STROKE. He was administered TPA. 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 HE was admitted to ICU for further work-up. He was extubated 8/17 to 3L [place].   Echo 8/17 showed apical thrombus measuring 2.17cm x 1.67cm, apical, apical anterior, and apical septal akinesis. Inferoseptal HK, EF 50-55%. He was started on IV heparin.  No prior cardiac history. Only h/o HTN. Family history negative for CAD. No prior smoking, alcohol, tobacco use. Prior to admission he was very functional. No h/o exertional chest pain or sob.   EKG with NSR, 85bpm, PVC Tele shows NSR with frequent PVCs, brief NSVT, HR in the 80e  Recent labs  K 3.7, Sodium 141, Scr 1.7, BUN 27, Chol 179, HDL 30,  LDL 132, TG 84 WBC 17.2 Hgb 12.0 A1C 5.9  Past Medical History:  Diagnosis Date   Hypertension     Past Surgical History:  Procedure Laterality Date   IR CT HEAD LTD  07/19/2021   IR PERCUTANEOUS ART THROMBECTOMY/INFUSION INTRACRANIAL INC DIAG ANGIO  07/19/2021   NO PAST SURGERIES     RADIOLOGY WITH ANESTHESIA N/A 07/19/2021   Procedure: IR WITH ANESTHESIA;  Surgeon: Julieanne Cotton, MD;  Location: [facility] OR;  Service: Radiology;  Laterality: N/A;     Home Medications:  Prior to  Admission medications   Medication Sig Start Date End Date Taking? Authorizing Provider  enalapril (VASOTEC) 20 MG tablet Take 20 mg by mouth daily. 06/12/21  Yes [provider]  hydrochlorothiazide (HYDRODIURIL) 12.5 MG tablet Take 12.5 mg by mouth daily. 06/12/21  Yes [provider]  ibuprofen (ADVIL) 200 MG tablet Take 200 mg by mouth every 6 (six) hours as needed for mild pain.   Yes [provider]    Inpatient Medications: Scheduled Meds:  atorvastatin  40 mg Oral Daily   Chlorhexidine Gluconate Cloth  6 each Topical Daily   docusate  100 mg Oral BID   pantoprazole  40 mg Oral QHS   polyethylene glycol  17 g Oral Daily   sodium chloride flush  3 mL Intravenous Once   tamsulosin  0.4 mg Oral Daily   Continuous Infusions:  sodium chloride     heparin Stopped (07/21/21 1332)   PRN Meds: acetaminophen **OR** acetaminophen (TYLENOL) oral liquid 160 mg/5 mL **OR** acetaminophen, fentaNYL (SUBLIMAZE) injection, fentaNYL (SUBLIMAZE) injection, senna-docusate  Allergies:   No Known Allergies  Social History:   Social History   Socioeconomic History   Marital status: Married    Spouse name: Not on file   Number of children: Not on file   Years of education: Not on file   Highest education level: Not on file  Occupational History   Not  on file  Tobacco Use   Smoking status: Never   Smokeless tobacco: Never  Substance and Sexual Activity   Alcohol use: Yes    Alcohol/week: 1.0 standard drink    Types: 1 Cans of beer per week   Drug use: Not on file   Sexual activity: Not on file  Other Topics Concern   Not on file  Social History Narrative   Not on file   Social Determinants of Health   Financial Resource Strain: Not on file  Food Insecurity: Not on file  Transportation Needs: Not on file  Physical Activity: Not on file  Stress: Not on file  Social Connections: Not on file  Intimate Partner Violence: Not on file    Family History:    History reviewed. No pertinent family history.   ROS:  Please see the history of present illness.   All other ROS reviewed and negative.     Physical Exam/Data:   Vitals:   07/21/21 1000 07/21/21 1100 07/21/21 1200 07/21/21 1300  BP:  126/69 133/76 135/81  Pulse: 85 83 81 90  Resp: 18 (!) 21 (!) 22 (!) 22  Temp:   98.5 F (36.9 C)   TempSrc:   Oral   SpO2: 94% 91% 93% 94%  Weight:        Intake/Output Summary (Last 24 hours) at 07/21/2021 1430 Last data filed at 07/21/2021 1341 Gross per 24 hour  Intake 1768.28 ml  Output 550 ml  Net 1218.28 ml   Last 3 Weights 07/19/2021  Weight (lbs) 199 lb 8.3 oz  Weight (kg) 90.5 kg     There is no height or weight on file to calculate BMI.  General:  Well nourished, well developed, in no acute distress HEENT: normal Lymph: no adenopathy Neck: no JVD Endocrine:  No thryomegaly Vascular: No carotid bruits; FA pulses 2+ bilaterally without bruits  Cardiac:  normal S1, S2; RRR; no murmur  Lungs:  clear to auscultation bilaterally, no wheezing, rhonchi or rales  Abd: soft, nontender, no hepatomegaly  Ext: no edema Musculoskeletal:  No deformities, BUE and BLE strength normal and equal Skin: warm and dry  Neuro:  CNs 2-12 intact, no focal abnormalities noted Psych:  Normal affect   EKG:  The EKG was personally reviewed and demonstrates:   Telemetry:  Telemetry was personally reviewed and demonstrates:  NSR, HR 80s, PVCs, breif NSVT (longest 3 beats)  Relevant CV Studies:  Echo 07/21/21  1. 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 Apical, apical anterior,  and apical septal akinesis. Inferoseptal hypokinesis. Left ventricular  ejection fraction, by estimation, is 50 to 55%. The left ventricle has low  normal function. The left  ventricle demonstrates regional wall motion abnormalities (see scoring  diagram/findings for description). Left ventricular diastolic parameters  are consistent with Grade I diastolic dysfunction  (impaired relaxation).   2. Right ventricular systolic function is normal. The right ventricular  size is normal.   3. The mitral valve is normal in structure. Trivial mitral valve  regurgitation. No evidence of mitral stenosis.   4. The aortic valve is normal in structure. Aortic valve regurgitation is  not visualized. No aortic stenosis is present.   5. The inferior vena cava is normal in size with greater than 50%  respiratory variability, suggesting right atrial pressure of 3 mmHg.    Laboratory Data:  High Sensitivity Troponin:  No results for input(s): TROPONINIHS in the last 720 hours.   Chemistry Recent Labs  Lab  07/19/21 1947 07/19/21 1953 07/20/21 0105 07/20/21 0536 07/21/21 0432  NA 140 141 142 142 141  K 3.9 3.9 4.6 4.0 3.7  CL 109 111  --  113* 106  CO2 22  --   --  20* 19*  GLUCOSE 77 76  --  180* 121*  BUN 25* 30*  --  27* 27*  CREATININE 1.53* 1.50*  --  1.74* 1.70*  CALCIUM 8.8*  --   --  8.1* 8.4*  GFRNONAA 46*  --   --  40* 41*  ANIONGAP 9  --   --  9 16*    Recent Labs  Lab 07/19/21 1947  PROT 6.9  ALBUMIN 3.4*  AST 23  ALT 15  ALKPHOS 54  BILITOT 1.1   Hematology Recent Labs  Lab 07/19/21 1947 07/19/21 1953 07/20/21 0105 07/20/21 0536 07/21/21 0432  WBC 10.4  --   --  10.9* 17.2*  RBC 5.10  --   --  4.68 3.87*  HGB 15.8   < > 13.9 14.4 12.0*  HCT 48.9   < > 41.0 45.3 36.6*  MCV 95.9  --   --  96.8 94.6  MCH 31.0  --   --  30.8 31.0  MCHC 32.3  --   --  31.8 32.8  RDW 13.9  --   --  14.3 14.6  PLT 272  --   --  245 227   < > = values in this interval not displayed.   BNPNo results for input(s): BNP, PROBNP in the last 168 hours.  DDimer No results for input(s): DDIMER in the last 168 hours.   Radiology/Studies:  MR BRAIN WO CONTRAST  Result Date: 07/20/2021 CLINICAL DATA:  Neuro deficit, acute, stroke suspected. Left ICA occlusion. EXAM: MRI HEAD WITHOUT CONTRAST TECHNIQUE: Multiplanar, multiecho pulse sequences of the brain and  surrounding structures were obtained without intravenous contrast. COMPARISON:  CT studies done yesterday. FINDINGS: Brain: Diffusion imaging shows fiber 6 punctate infarctions within the left cerebellum, acute infarction of the left anteromedial hippocampus, and a few scattered infarctions in the posterior frontal and parietal regions on the left. The largest confluent infarction is about 1.5 cm in size and probably affects the precentral gyrus. In the right hemisphere, there are scattered punctate infarctions in the occipital lobe and frontal lobe. No large confluent infarction. No evidence of swelling or acute hemorrhage. There chronic small-vessel ischemic changes of the pons. There are a few older small vessel cerebellar infarctions on the left. There is an old right frontal cortical and subcortical infarction. There is an old left occipital cortical and subcortical infarction with hemosiderin deposition. No evidence of mass, hydrocephalus or extra-axial collection. Vascular: Major vessels at the base of the brain show flow. 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 No large confluent acute infarction. The single largest acute insult is about a cm and a half in size and located in the left posterior frontal region, probably the precentral gyrus. Electronically Signed   By: Paulina Fusi M.D.   On: 07/20/2021 19:28   IR CT Head Ltd  Result Date: 07/21/2021 INDICATION: Acute onset of right-sided weakness, left gaze deviation, and aphasia. 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 EXAM: 1. EMERGENT LARGE VESSEL OCCLUSION THROMBOLYSIS (anterior COMPARISON:  CT angiogram of the  head and neck of July 19, 2021. MEDICATIONS: Ancef 2 g IV  antibiotic was administered within 1 hour of the procedure. ANESTHESIA/SEDATION: General anesthesia. CONTRAST:  Omnipaque 240 60 mL. FLUOROSCOPY TIME:  Fluoroscopy Time: 38 minutes 18 seconds (1896 mGy). COMPLICATIONS: None immediate. TECHNIQUE: Following a full explanation of the procedure along with the potential associated complications, an informed witnessed consent was obtained. The risks of intracranial hemorrhage of 10%, worsening neurological deficit, ventilator dependency, death and inability to revascularize were all reviewed in detail with the patient's daughter. The patient was then put under general anesthesia by the Department of Anesthesiology at [facility]. The right groin was prepped and draped in the usual sterile fashion. Thereafter using modified Seldinger technique, transfemoral access into the right common femoral artery was obtained without difficulty. Over a 0.035 inch guidewire an 8 [place] Pinnacle 25 cm sheath was inserted. Through this, and also over a 0.035 inch guidewire a 5 Simmons 2 catheter was advanced to the aortic arch region and selectively positioned in left common carotid artery. FINDINGS: The left common carotid arteriogram demonstrates opacification of the left external carotid artery and its major branches to be grossly unremarkable. The left internal carotid artery at the bulb and just distally demonstrates stasis of contrast. No evidence of a more distal angiographic string sign. No distal reconstitution of the left ICA noted from the external carotid artery branches via the ophthalmic artery. PROCEDURE: The Simmons 2 diagnostic catheter was then exchanged over a 0.035 inch 300 cm Rosen exchange guidewire for an 087 balloon guide catheter which was positioned in the left internal carotid artery bulb region. Gentle control arteriogram performed through the balloon guide catheter demonstrated continuous stasis of contrast in the left internal carotid artery at the  bulb and just distal to this. Over a 0.014 inch standard Synchro micro guidewire with a J-tip configuration, an 021 162 cm microcatheter inside of an 132 cm 71 Zoom aspiration catheter combination was advanced to the supraclinoid left ICA and then the left middle cerebral artery M2 M3 region followed by the microcatheter. The guidewire was removed. Good aspiration obtained from the hub of the microcatheter. A gentle control arteriogram performed through the microcatheter demonstrated safe position of the tip of the microcatheter with slow antegrade clearance of contrast. This was then connected to continuous heparinized saline infusion. A 6 mm x 40 mm Solitaire X retrieval device was then advanced to the distal end of the microcatheter and deployed in the usual manner such that the proximal portion was in the supraclinoid left ICA. The Zoom aspiration catheter was then advanced into the left middle cerebral artery proximally. Thereafter with proximal flow arrest in the left internal carotid artery, and constant aspiration applied at the hub of the balloon guide catheter with a 20 mL syringe, and at the hub of the Zoom aspiration catheter with Penumbra aspiration apparatus for approximately 2-1/2 minutes, the combination of the retrieval device, microcatheter, and the Zoom aspiration catheter were retrieved and removed. Following reversal of flow arrest, a control arteriogram performed through the balloon guide catheter in the proximal left ICA demonstrated revascularization of the left internal carotid artery proximally and distally. The left middle cerebral artery demonstrated wide patency achieving a TICI 3 revascularization. Also noted was opacification of the left anterior cerebral artery into the capillary and venous phases. Cross filling via the anterior communicating artery of the right anterior cerebral A2 segment, and distal right A1 segment was noted. The balloon guide catheter was then retrieved  into the  left common carotid artery. A control arteriogram performed from this site demonstrated wide patency of the left internal carotid artery proximally and distally with no change in the complete revascularization of the left middle cerebral distribution. Balloon guide was then removed. The 8 French Pinnacle sheath was removed with manual compression held for 30 minutes with a quick clot for hemostasis. Distal pulses remained Dopplerable in both feet unchanged. A CT of the brain demonstrated no evidence of intracranial hemorrhage or mass effect. Patient was left intubated due to difficulty with communication due to language barrier prior to the procedure, and awaiting the results of the COVID-19 test. Patient was then transferred to the neuro ICU for post thrombectomy management. IMPRESSION: Status post endovascular complete revascularization of occluded left internal carotid artery proximally and distally to the terminus and the origin of the left middle cerebral artery with 1 pass with a 6 mm x 40 mm Solitaire X retrieval device, and contact aspiration achieving a TICI 3 revascularization. PLAN: Follow-up as per referring MD. Electronically Signed   By: Julieanne Cotton M.D.   On: 07/20/2021 09:43   DG CHEST PORT 1 VIEW  Result Date: 07/21/2021 CLINICAL DATA:  CHF EXAM: PORTABLE CHEST 1 VIEW COMPARISON:  None. FINDINGS: Enlarged cardiomediastinal silhouette. Low lung volumes with bibasilar subsegmental atelectasis. Mild lower lung predominant interstitial opacities. Small bilateral pleural effusions. No visible pneumothorax. Bilateral shoulder degenerative changes. No acute osseous abnormality. IMPRESSION: Cardiomegaly with interstitial pulmonary edema and small bilateral pleural effusions. Low lung volumes. Electronically Signed   By: Caprice Renshaw M.D.   On: 07/21/2021 13:59   DG CHEST PORT 1 VIEW  Result Date: 07/20/2021 CLINICAL DATA:  Check gastric catheter placement EXAM: PORTABLE CHEST 1 VIEW  COMPARISON:  None. FINDINGS: Endotracheal tube is noted in satisfactory position. Cardiac shadow is enlarged. Poor inspiratory effort is noted with bibasilar atelectasis. Gastric catheter is seen within the stomach. No bony abnormality is noted. IMPRESSION: Tubes and lines in satisfactory position. Mild bibasilar atelectatic changes are seen. Electronically Signed   By: Alcide Clever M.D.   On: 07/20/2021 00:29   VAS [place] GROIN PSEUDOANEURYSM  Result Date: 07/21/2021  ARTERIAL PSEUDOANEURYSM  Patient Name:  DESMIN DALEO  Date of Exam:   07/21/2021 Medical Rec #: [id number]          Accession #:    [id number] Date of Birth: 10/25/1941          Patient Gender: M Patient Age:   32 years Exam Location:  [facility] Procedure:      VAS [place] Bobetta Lime Referring Phys: Lannette Donath MATTHEWS --------------------------------------------------------------------------------  Exam: Right groin Indications: Patient complains of groin pain and bruising. History: Status post mechanical thrombectomy and revascularization of occluded Lt ICA extracranially and intracranially and Lt Lt MCA at origin. Comparison Study: No prior study Performing Technologist: Sherren Kerns RVS  Examination Guidelines: A complete evaluation includes B-mode imaging, spectral Doppler, color Doppler, and power Doppler as needed of all accessible portions of each vessel. Bilateral testing is considered an integral part of a complete examination. Limited examinations for reoccurring indications may be performed as noted.  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 The neck measures approximately 0.9 cm wide and 1.3 cm long.  Diagnosing physician: Lemar Livings MD Electronically signed by Lemar Livings MD on 07/21/2021 at 1:34:48 PM.    --------------------------------------------------------------------------------  Final    ECHOCARDIOGRAM COMPLETE  Result Date: 07/20/2021    ECHOCARDIOGRAM REPORT   Patient Name:   ARUSH GATLIFF Date of Exam: 07/20/2021 Medical Rec #:  [id number]         Height: Accession #:    [id number]        Weight:       199.5 lb Date of Birth:  03/07/1941         BSA:          2.016 m Patient Age:    80 years          BP:           106/79 mmHg Patient Gender: M                 HR:           83 bpm. Exam Location:  Inpatient Procedure: 2D Echo, Cardiac Doppler, Color Doppler and Intracardiac            Opacification Agent Indications:    Stroke  History:        Patient has no prior history of Echocardiogram examinations.  Sonographer:    Roosvelt Maser RDCS Referring Phys: [id number] [facility]  Sonographer Comments: Global longitudinal strain was attempted. IMPRESSIONS  1. 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 Apical, apical anterior, and apical septal akinesis. Inferoseptal hypokinesis. Left ventricular ejection fraction, by estimation, is 50 to 55%. The left ventricle has low normal function. The left ventricle demonstrates regional wall motion abnormalities (see scoring diagram/findings for description). Left ventricular diastolic parameters are consistent with Grade I diastolic dysfunction (impaired relaxation).  2. Right ventricular systolic function is normal. The right ventricular size is normal.  3. The mitral valve is normal in structure. Trivial mitral valve regurgitation. No evidence of mitral stenosis.  4. The aortic valve is normal in structure. Aortic valve regurgitation is not visualized. No aortic stenosis is present.  5. The inferior vena cava is normal in size with greater than 50% respiratory variability, suggesting right atrial pressure of 3 mmHg. FINDINGS  Left Ventricle: Apical thrombus measuring 2.17 cm x 1.67 cm. Apical, apical anterior, and apical septal akinesis. Inferoseptal  hypokinesis. Left ventricular ejection fraction, by estimation, is 50 to 55%. The left ventricle has low normal function. The left ventricle demonstrates regional wall motion abnormalities. Definity contrast agent was given IV to delineate the left ventricular endocardial borders. The left ventricular internal cavity size was normal in size. There is no left ventricular hypertrophy. Left ventricular diastolic parameters are consistent with Grade I diastolic dysfunction (impaired relaxation). Normal left ventricular filling pressure. Right Ventricle: The right ventricular size is normal. No increase in right ventricular wall thickness. Right ventricular systolic function is normal. Left Atrium: Left atrial size was normal in size. Right Atrium: Right atrial size was normal in size. Pericardium: There is no evidence of pericardial effusion. Mitral Valve: The mitral valve is normal in structure. Trivial mitral valve regurgitation. No evidence of mitral valve stenosis. Tricuspid Valve: The tricuspid valve is normal in structure. Tricuspid valve regurgitation is not demonstrated. No evidence of tricuspid stenosis. Aortic Valve: The aortic valve is normal in structure. Aortic valve regurgitation is not visualized. No aortic stenosis is present. Pulmonic Valve: The pulmonic valve was normal in structure. Pulmonic valve regurgitation is not visualized. No evidence of pulmonic stenosis. Aorta: The aortic root is normal in size and structure. Venous: The inferior vena cava is normal in size with greater than 50%  respiratory variability, suggesting right atrial pressure of 3 mmHg. IAS/Shunts: No atrial level shunt detected by color flow Doppler.   LV Volumes (MOD) LV vol s, MOD A4C: 76.1 ml Diastology                            LV e' medial:    3.15 cm/s                            LV E/e' medial:  9.5                            LV e' lateral:   6.31 cm/s                            LV E/e' lateral: 4.8  LEFT ATRIUM            Index LA Vol (A4C): 44.3 ml 21.97 ml/m  MITRAL VALVE MV Area (PHT): 4.19 cm MV Decel Time: 181 msec MV E velocity: 30.00 cm/s MV A velocity: 75.40 cm/s MV E/A ratio:  0.40 Chilton Si MD Electronically signed by Chilton Si MD Signature Date/Time: 07/20/2021/4:46:12 PM    Final    IR PERCUTANEOUS ART THROMBECTOMY/INFUSION INTRACRANIAL INC DIAG ANGIO  Result Date: 07/21/2021 INDICATION: Acute onset of right-sided weakness, left gaze deviation, and aphasia. 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 EXAM: 1. EMERGENT LARGE VESSEL OCCLUSION THROMBOLYSIS (anterior COMPARISON:  CT angiogram of the head and neck of July 19, 2021. MEDICATIONS: Ancef 2 g IV antibiotic was administered within 1 hour of the procedure. ANESTHESIA/SEDATION: General anesthesia. CONTRAST:  Omnipaque 240 60 mL. FLUOROSCOPY TIME:  Fluoroscopy Time: 38 minutes 18 seconds (1896 mGy). COMPLICATIONS: None immediate. TECHNIQUE: Following a full explanation of the procedure along with the potential associated complications, an informed witnessed consent was obtained. The risks of intracranial hemorrhage of 10%, worsening neurological deficit, ventilator dependency, death and inability to revascularize were all reviewed in detail with the patient's daughter. The patient was then put under general anesthesia by the Department of Anesthesiology at [facility]. The right groin was prepped and draped in the usual sterile fashion. Thereafter using modified Seldinger technique, transfemoral access into the right common femoral artery was obtained without difficulty. Over a 0.035 inch guidewire an 8 [place] Pinnacle 25 cm sheath was inserted. Through this, and also over a 0.035 inch guidewire a 5 Simmons 2 catheter was advanced to the aortic arch region and selectively positioned in left common carotid artery. FINDINGS: The left common carotid  arteriogram demonstrates opacification of the left external carotid artery and its major branches to be grossly unremarkable. The left internal carotid artery at the bulb and just distally demonstrates stasis of contrast. No evidence of a more distal angiographic string sign. No distal reconstitution of the left ICA noted from the external carotid artery branches via the ophthalmic artery. PROCEDURE: The Simmons 2 diagnostic catheter was then exchanged over a 0.035 inch 300 cm Rosen exchange guidewire for an 087 balloon guide catheter which was positioned in the left internal carotid artery bulb region. Gentle control arteriogram performed through the balloon guide catheter demonstrated continuous stasis of contrast in the left internal carotid artery at the bulb and just distal to this. Over a 0.014  inch standard Synchro micro guidewire with a J-tip configuration, an 021 162 cm microcatheter inside of an 132 cm 71 Zoom aspiration catheter combination was advanced to the supraclinoid left ICA and then the left middle cerebral artery M2 M3 region followed by the microcatheter. The guidewire was removed. Good aspiration obtained from the hub of the microcatheter. A gentle control arteriogram performed through the microcatheter demonstrated safe position of the tip of the microcatheter with slow antegrade clearance of contrast. This was then connected to continuous heparinized saline infusion. A 6 mm x 40 mm Solitaire X retrieval device was then advanced to the distal end of the microcatheter and deployed in the usual manner such that the proximal portion was in the supraclinoid left ICA. The Zoom aspiration catheter was then advanced into the left middle cerebral artery proximally. Thereafter with proximal flow arrest in the left internal carotid artery, and constant aspiration applied at the hub of the balloon guide catheter with a 20 mL syringe, and at the hub of the Zoom aspiration catheter with Penumbra aspiration  apparatus for approximately 2-1/2 minutes, the combination of the retrieval device, microcatheter, and the Zoom aspiration catheter were retrieved and removed. Following reversal of flow arrest, a control arteriogram performed through the balloon guide catheter in the proximal left ICA demonstrated revascularization of the left internal carotid artery proximally and distally. The left middle cerebral artery demonstrated wide patency achieving a TICI 3 revascularization. Also noted was opacification of the left anterior cerebral artery into the capillary and venous phases. Cross filling via the anterior communicating artery of the right anterior cerebral A2 segment, and distal right A1 segment was noted. The balloon guide catheter was then retrieved into the left common carotid artery. A control arteriogram performed from this site demonstrated wide patency of the left internal carotid artery proximally and distally with no change in the complete revascularization of the left middle cerebral distribution. Balloon guide was then removed. The 8 French Pinnacle sheath was removed with manual compression held for 30 minutes with a quick clot for hemostasis. Distal pulses remained Dopplerable in both feet unchanged. A CT of the brain demonstrated no evidence of intracranial hemorrhage or mass effect. Patient was left intubated due to difficulty with communication due to language barrier prior to the procedure, and awaiting the results of the COVID-19 test. Patient was then transferred to the neuro ICU for post thrombectomy management. IMPRESSION: Status post endovascular complete revascularization of occluded left internal carotid artery proximally and distally to the terminus and the origin of the left middle cerebral artery with 1 pass with a 6 mm x 40 mm Solitaire X retrieval device, and contact aspiration achieving a TICI 3 revascularization. PLAN: Follow-up as per referring MD. Electronically Signed   By: Julieanne CottonSanjeev   Deveshwar M.D.   On: 07/20/2021 09:43   CT HEAD CODE STROKE WO CONTRAST  Result Date: 07/19/2021 CLINICAL DATA:  Code stroke.  Acute neurologic deficit EXAM: CT HEAD WITHOUT CONTRAST TECHNIQUE: Contiguous axial images were obtained from the base of the skull through the vertex without intravenous contrast. COMPARISON:  None. FINDINGS: Brain: There is no mass, hemorrhage or extra-axial collection. The size and configuration of the ventricles and extra-axial CSF spaces are normal. 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 Vascular: No abnormal hyperdensity of the major intracranial arteries or dural venous sinuses. No intracranial atherosclerosis. Skull: The visualized skull base, calvarium and extracranial soft tissues are normal. Sinuses/Orbits: No fluid levels or advanced mucosal thickening of the visualized paranasal sinuses. No mastoid  or middle ear effusion. The orbits are normal. ASPECTS [facility] Stroke Program Early CT Score) - Ganglionic level infarction (caudate, lentiform nuclei, internal capsule, insula, M1-M3 cortex): 7 - Supraganglionic infarction (M4-M6 cortex): 3 Total score (0-10 with 10 being normal): 10 IMPRESSION: 1. No acute intracranial abnormality. 2. 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 3. ASPECTS is 10. 4. These results were called by telephone at the time of interpretation on 07/19/2021 at 8:00 pm to provider [facility] , who verbally acknowledged these results. Electronically Signed   By: Deatra Robinson M.D.   On: 07/19/2021 20:01   CT ANGIO HEAD NECK W WO CM (CODE STROKE)  Result Date: 07/19/2021 CLINICAL DATA:  Left MCA symptoms EXAM: CT ANGIOGRAPHY HEAD AND NECK TECHNIQUE: Multidetector CT imaging of the head and neck was performed using the standard protocol during bolus administration of intravenous contrast. Multiplanar CT image reconstructions and MIPs were obtained to evaluate the vascular anatomy. Carotid stenosis measurements (when applicable) are obtained utilizing NASCET  criteria, using the distal internal carotid diameter as the denominator. CONTRAST:  OMNIPAQUE IOHEXOL 350 MG/ML SOLN COMPARISON:  None. FINDINGS: CTA NECK FINDINGS SKELETON: There is no bony spinal canal stenosis. No lytic or blastic lesion. OTHER NECK: Normal pharynx, larynx and major salivary glands. No cervical lymphadenopathy. Unremarkable thyroid gland. UPPER CHEST: No pneumothorax or pleural effusion. No nodules or masses. AORTIC ARCH: There is no calcific atherosclerosis of the aortic arch. There is no aneurysm, dissection or hemodynamically significant stenosis of the visualized portion of the aorta. Conventional 3 vessel aortic branching pattern. The visualized proximal subclavian arteries are widely patent. RIGHT CAROTID SYSTEM: Normal without aneurysm, dissection or stenosis. LEFT CAROTID SYSTEM: The left ICA is occluded at its origin and remains occluded to the carotid terminus. VERTEBRAL ARTERIES: Left dominant configuration. Both origins are clearly patent. There is no dissection, occlusion or flow-limiting stenosis to the skull base (V1-V3 segments). CTA HEAD FINDINGS POSTERIOR CIRCULATION: --Vertebral arteries: Normal V4 segments. --Inferior cerebellar arteries: Normal. --Basilar artery: Normal. --Superior cerebellar arteries: Normal. --Posterior cerebral arteries (PCA): Normal. ANTERIOR CIRCULATION: --Intracranial internal carotid arteries: Normal. --Anterior cerebral arteries (ACA): Normal. Both A1 segments are present. Patent anterior communicating artery (a-comm). --Middle cerebral arteries (MCA): Right MCA is normal. The left MCA is opacified via collateral flow across the anterior communicating artery. VENOUS SINUSES: As permitted by contrast timing, patent. ANATOMIC VARIANTS: None Review of the MIP images confirms the above findings. IMPRESSION: 1. No intracranial arterial occlusion or high-grade stenosis. 2. Occlusion of the left ICA at its origin and remains occluded to the carotid  terminus. The left MCA is opacified via collateral flow across the anterior communicating artery. Aortic Atherosclerosis (ICD10-I70.0). Electronically Signed   By: Deatra Robinson M.D.   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 - No known history of afib. Tele shows SR with PVCs. 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% with apical thrombus measuring 2.17 x 1.67cm, apical, apical septal akinesis, inferoseptal HK, G1DD - started on IV heparin - No prior cardiac history -  Unsure why EF low normal with apical thrombus, possible CM in the past with improvement - continue IV heparin>>will eventually need warfarin with INR goal 2-3 and follow up echo  HLD - LDL 132 - continue Atorvastatin 40mg  daily  Hypoxia Diastolic heart failure - CXR showed interstitial pulmonary edema - s/p IV lasix 40mg  once - He is on 3L O2 - monitor symptoms, if still SOB may need additional lasix  HTN - PTA Enalapril and HCTZ - permissive hypertension with goal SBP 120-140  AKI vs CKD - unclear baseline - HCTZ and ACEi held on admission - On admission Scr 1.53, peak 1.74 - daily BMET  Leukocytosis - elevated WBC with no obvious infection - trend CBC  PVCs - consider addition of BB    For questions or updates, please contact [facility] Please consult [email or web address] for contact info under    Signed, Kili Gracy , PA-C  07/21/2021 2:30 PM

## 2021-07-21 NOTE — ED Notes (Addendum)
Pt arrived to IR suite. Report given to IR nurse.

## 2021-07-21 NOTE — Evaluation (Signed)
Speech Language Pathology Evaluation Patient Details Name: Roberto Dickson MRN: [id number] DOB: 06/22/1941 Today's Date: 07/21/2021 Time: 1320-1340 SLP Time Calculation (min) (ACUTE ONLY): 20 min  Problem List:  Patient Active Problem List   Diagnosis Date Noted   Acute ischemic left MCA stroke (HCC) 07/19/2021   Internal carotid artery occlusion, 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   Past Medical History:  Past Medical History:  Diagnosis Date   Hypertension    Past Surgical History:  Past Surgical History:  Procedure Laterality Date   IR CT HEAD LTD  07/19/2021   IR PERCUTANEOUS ART THROMBECTOMY/INFUSION INTRACRANIAL INC DIAG ANGIO  07/19/2021   NO PAST SURGERIES     RADIOLOGY WITH ANESTHESIA N/A 07/19/2021   Procedure: IR WITH ANESTHESIA;  Surgeon: Julieanne Cotton, MD;  Location: [facility] OR;  Service: Radiology;  Laterality: N/A;   HPI:  79 y/o white male that presented to the ER from home.  The pt took a nap from 1700hrs to 1830hrs and woke up with right upper extrem weakness and aphasia.  The pt was a stroke alert and was given Tpa.  The pt then went to IR where they found occluded left ICA.  IR was able to open the left ICA.  Pt reports resolution of aphasia   Assessment / Plan / Recommendation Clinical Impression  Pt scored WNL on SLUMS (excluded visual portion given pt laying flat at this time). No persitent signs of aphasia (assessed in Spanish). No recommendation for further SLP f/u will sign off.    SLP Assessment  SLP Recommendation/Assessment: Patient does not need any further Speech Lanaguage Pathology Services    Follow Up Recommendations       Frequency and Duration           SLP Evaluation Cognition  Overall Cognitive Status: Within Functional Limits for tasks assessed Orientation Level: Oriented X4       Comprehension  Auditory Comprehension Overall Auditory Comprehension: Appears within functional limits for tasks assessed    Expression Verbal Expression Overall  Verbal Expression: Appears within functional limits for tasks assessed   Oral / Motor  Motor Speech Overall Motor Speech: Appears within functional limits for tasks assessed   GO                    Ezabella Teska, Riley Nearing 07/21/2021, 2:23 PM

## 2021-07-21 NOTE — Progress Notes (Signed)
STROKE TEAM PROGRESS NOTE    Interval History   No acute events overnight, patient is doing well postextubation.  Still has some mild expressive language difficulties and significant weakness of the right grip and hand but is otherwise doing well.  He has right groin pain and ultrasound shows right groin pseudoaneurysm.  Interventional radiology plans to do thrombin injection.  .   Vital signs are stable.  Neurological exam is unchanged 2D echo shows left ventricular apical clot.  Patient has been started on IV heparin.  Cardiology consult is pending Pertinent Lab Work and Imaging    07/19/21 CT Head WO IV Contrast 1. No acute intracranial abnormality. 2. 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 3. ASPECTS is 10.  07/19/21 CT Angio Head and Neck W WO IV Contrast 1. No intracranial arterial occlusion or high-grade stenosis. 2. 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 The left MCA is opacified via collateral flow across the anterior communicating artery.  MRI Brain WO IV Contrast Scattered acute infarctions within the left cerebellum and both cerebral hemispheres, consistent with cardiac or ascending aortic source. No large confluent acute infarction. 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  Left ventricular ejection fraction slightly diminished at 50 to 55%.  Physical Examination   Constitutional: Pleasant elderly Hispanic male.  Not in distress.  Cardiovascular: Normal RR Respiratory: No increased WOB   Mental status: Awake alert mild expressive language difficulties with occasional mild dysarthria.  Follows commands well. Speech: UTA  Cranial nerves: EOMI, VFF, UTA for  facial droop due to ET tube  Motor: Normal bulk and tone. No drift. Antigravity throughout with significant right hand and trace leg weakness. Sensory: UTA, noxious stimulation deferred  Coordination: Deferred  Gait: Deferred given patient acuity    Assessment and Plan   Mr. CAM DAUPHIN is a 80 y.o. male w/pmh of HTN who presents with L MCA syndrome, received IVTPA and underwent mechanical thrombectomy.   NEURO #L MCA Stroke in the setting of LICA occlusion s/p mechanical thrombectomy with TICI 2 revascularization likely cardioembolic from left ventricular apical clot Patient presented with the symptoms described above. At this time, stroke work up is ongoing. His CTA Head and Neck showed LICA occlusion, no significant atherosclerosis. Initial CTH w/NAICP. MRI Brain, Echocardiogram are pending. Stroke labs were completed including Lipid panel w/LDL 132 and Hemoglobin A1C 5.9. Stroke etiology is cryptogenic, possibly cardioembolic.  -Initiate Aspirin 81 mg at 2000 today, 24 hours post IVTPA  -Atorvastatin 40 mg for stroke prevention  -PT/OT/ST  -At discharge will place ambulatory referral to neurology for stroke follow up   CARDS #Hypertension He has a history of HTN, does not appear to be taking any medications at home. SBP is trending normotensive at this time. SBP goal is 120-140 post thrombectomy.  - Trend pressures  - Long term blood pressure goal is < 140/90.  #Hyperlipidemia From a stroke prevention stand point, the LDL goal is < 70. LDL is 132, Atorvastatin 40 mg started this admission.   RESP #Respiratory Failure requiring Intubation  Successfully extubated to 3L [place] on 8/17. Pulmonary toilet with OOB to chair.   RENAL #AKI versus CKD  Cr elevated at 1.74, unclear what baseline  Cr is. Will trend, encourage PO fluids if he passes bedside swallow evaluation.   GI  Pending bedside swallow post extubation   ENDO #Prediabetes  Hemoglobin A1C this admission noted to be  5.9, in the prediabetic range. Will manage hyperglycemia with SSI if necessary   HEME #DVT Prophylaxis  Hemoglobin hematocrit and platelet count stable.  Using SCDs for Dvt Prophylaxis currently, will initiate Lovenox when he is 24 hours out from IVTPA.   ID #Leukocytosis  Afebrile. No infectious processes at this time. He has a mild leukocytosis at 10.9- are trending   Hospital day # 2     Patient presented with aphasia and right hemiplegia due to left carotid occlusion and underwent treatment with IV tPA followed by successful mechanical thrombectomy and revascularization of the left ICA.  Etiologies of stroke is likely cardioembolic from left ventricular apical clot.  Continue IV heparin for now.  Await cardiology consult and recommendations likely need to transition to short-term anticoagulation for 3 to 6 months.  Mobilize out of bed.  Therapy consults.  Continue close neurological observation  .  No family available at the bedside for discussion.  Discussed with Dr. Corliss Skains and Dr. Lynnell Catalan critical care medicine.  Patient will have thrombin injection into his right groin pseudoaneurysm by interventional radiology. This patient is critically ill and at significant risk of neurological worsening, death and care requires constant monitoring of vital signs, hemodynamics,respiratory and cardiac monitoring, extensive review of multiple databases, frequent neurological assessment, discussion with family, other specialists and medical decision making of high complexity.I have made any additions or clarifications directly to the above note.This critical care time does not reflect procedure time, or teaching time or supervisory time of PA/NP/Med Resident etc but could involve care discussion time.  I spent 30 minutes of neurocritical care time  in the care of  this patient.      Delia Heady, MD Medical Director [facility] Stroke Center Pager: [phone] 07/21/2021 5:19 PM   To contact  Stroke Continuity provider, please refer to [email or web address]. After hours, contact General Neurology

## 2021-07-21 NOTE — Progress Notes (Signed)
PT Cancellation Note  Patient Details Name: Roberto Dickson MRN: [id number] DOB: 1941-09-21   Cancelled Treatment:    Reason Eval/Treat Not Completed: Patient at procedure or test/unavailable. About to leave for IR to have hematoma repaired. Will plan to follow-up another day as appropriate.   Raymond Gurney, PT, DPT Acute Rehabilitation Services  Pager: [phone] Office: [phone]    [name] 07/21/2021, 1:51 PM

## 2021-07-22 ENCOUNTER — Inpatient Hospital Stay (HOSPITAL_COMMUNITY): Payer: Medicare Other

## 2021-07-22 DIAGNOSIS — I639 Cerebral infarction, unspecified: Secondary | ICD-10-CM | POA: Diagnosis not present

## 2021-07-22 DIAGNOSIS — I724 Aneurysm of artery of lower extremity: Secondary | ICD-10-CM

## 2021-07-22 DIAGNOSIS — I10 Essential (primary) hypertension: Secondary | ICD-10-CM

## 2021-07-22 DIAGNOSIS — I63512 Cerebral infarction due to unspecified occlusion or stenosis of left middle cerebral artery: Secondary | ICD-10-CM

## 2021-07-22 DIAGNOSIS — I513 Intracardiac thrombosis, not elsewhere classified: Secondary | ICD-10-CM | POA: Diagnosis not present

## 2021-07-22 LAB — CBC
HCT: 37.9 % — ABNORMAL LOW (ref 39.0–52.0)
Hemoglobin: 12.2 g/dL — ABNORMAL LOW (ref 13.0–17.0)
MCH: 31.4 pg (ref 26.0–34.0)
MCHC: 32.2 g/dL (ref 30.0–36.0)
MCV: 97.4 fL (ref 80.0–100.0)
Platelets: 211 10*3/uL (ref 150–400)
RBC: 3.89 MIL/uL — ABNORMAL LOW (ref 4.22–5.81)
RDW: 14.7 % (ref 11.5–15.5)
WBC: 12 10*3/uL — ABNORMAL HIGH (ref 4.0–10.5)
nRBC: 0 % (ref 0.0–0.2)

## 2021-07-22 LAB — HEPARIN LEVEL (UNFRACTIONATED): Heparin Unfractionated: <0.1 IU/mL — ABNORMAL LOW (ref 0.30–0.70)

## 2021-07-22 MED ORDER — METOPROLOL SUCCINATE ER 25 MG PO TB24
25.0000 mg | ORAL_TABLET | Freq: Every day | ORAL | Status: DC
  Administered 2021-07-22 – 2021-07-26 (×5): 25 mg via ORAL
  Filled 2021-07-22 (×5): qty 1

## 2021-07-22 MED ORDER — WARFARIN - PHARMACIST DOSING INPATIENT: Freq: Every day | Status: DC

## 2021-07-22 MED ORDER — HEPARIN (PORCINE) 25000 UT/250ML-% IV SOLN
1550.0000 [IU]/h | INTRAVENOUS | Status: AC
  Administered 2021-07-22: 1300 [IU]/h via INTRAVENOUS
  Administered 2021-07-23: 1350 [IU]/h via INTRAVENOUS
  Administered 2021-07-24: 1550 [IU]/h via INTRAVENOUS
  Filled 2021-07-22 (×2): qty 250

## 2021-07-22 MED ORDER — HEPARIN (PORCINE) 25000 UT/250ML-% IV SOLN
1300.0000 [IU]/h | INTRAVENOUS | Status: DC
  Administered 2021-07-22: 1300 [IU]/h via INTRAVENOUS
  Filled 2021-07-22: qty 250

## 2021-07-22 MED ORDER — DOCUSATE SODIUM 100 MG PO CAPS
100.0000 mg | ORAL_CAPSULE | Freq: Two times a day (BID) | ORAL | Status: DC
  Administered 2021-07-22 – 2021-07-26 (×9): 100 mg via ORAL
  Filled 2021-07-22 (×9): qty 1

## 2021-07-22 MED ORDER — WARFARIN SODIUM 4 MG PO TABS
4.0000 mg | ORAL_TABLET | Freq: Once | ORAL | Status: AC
  Administered 2021-07-22: 4 mg via ORAL
  Filled 2021-07-22 (×3): qty 1

## 2021-07-22 NOTE — Progress Notes (Signed)
Inpatient Rehab Admissions Coordinator:   Consult received. I will not have a bed for this patient before Monday and I will f/u with him Monday morning.   Estill Dooms, PT, DPT Admissions Coordinator [phone] 07/22/21  3:05 PM

## 2021-07-22 NOTE — Progress Notes (Signed)
Was called in to patient's room by OT because saline locked IV was profusely bleeding.  I removed IV, held compression, and bandaged arm. I returned to the room 30 minutes later and the site had rebled with a pool of blood on the floor. Dr Pearlean Brownie was paged to notify of bleeding and he said to stop the heparin drip temporarily until bleeding is completely stopped then restart. It is ok to give scheduled Coumadin. I will continue to monitor site for bleeding. Rune Mendez C 5:33 PM

## 2021-07-22 NOTE — Progress Notes (Signed)
STROKE TEAM PROGRESS NOTE    Interval History   No acute events overnight, patient had thrombin injection into his right groin pseudoaneurysm and follow-up ultrasound this morning shows complete resolution.  His wife is at the bedside..  Still has some mild expressive language difficulties and significant weakness of the right grip and hand but is otherwise doing well.  He still has mild right groin pain   Vital signs are stable.  Neurological exam is unchanged Patient's IV heparin was on hold last night due to bruising in his thighs which has appeared stable and he will be restarted on IV heparin.  Cardiology consult i appreciated  Pertinent Lab Work and Imaging    07/19/21 CT Head WO IV Contrast 1. No acute intracranial abnormality. 2. 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 3. ASPECTS is 10.  07/19/21 CT Angio Head and Neck W WO IV Contrast 1. No intracranial arterial occlusion or high-grade stenosis. 2. 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 The left MCA is opacified via collateral flow across the anterior communicating artery.  MRI Brain WO IV Contrast Scattered acute infarctions within the left cerebellum and both cerebral hemispheres, consistent with cardiac or ascending aortic source. No large confluent acute infarction. 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  Left ventricular ejection fraction slightly diminished at 50 to 55%.  Physical Examination   Constitutional: Pleasant elderly Hispanic male.  Not in distress.  Cardiovascular: Normal RR Respiratory: No increased WOB   Mental status: Awake alert mild expressive language difficulties with occasional mild  dysarthria.  Follows commands well. Speech: UTA  Cranial nerves: EOMI, VFF, UTA for facial droop due to ET tube  Motor: Normal bulk and tone. No drift. Antigravity throughout with significant right hand and trace leg weakness. Sensory: UTA, noxious stimulation deferred  Coordination: Deferred  Gait: Deferred given patient acuity    Assessment and Plan   [name] is a 80 y.o. male w/pmh of HTN who presents with L MCA syndrome, received IVTPA and underwent mechanical thrombectomy.   NEURO #L MCA Stroke in the setting of LICA occlusion s/p mechanical thrombectomy with TICI 2 revascularization likely cardioembolic from left ventricular apical clot Patient presented with the symptoms described above. At this time, stroke work up is ongoing. His CTA Head and Neck showed LICA occlusion, no significant atherosclerosis. Initial CTH w/NAICP. MRI Brain, Echocardiogram are pending. Stroke labs were completed including Lipid panel w/LDL 132 and Hemoglobin A1C 5.9. Stroke etiology is cryptogenic, possibly cardioembolic.  -Initiate Aspirin 81 mg at 2000 today, 24 hours post IVTPA  -Atorvastatin 40 mg for stroke prevention  -PT/OT/ST  -At discharge will place ambulatory referral to neurology for stroke follow up   CARDS #Hypertension He has a history of HTN, does not appear to be taking any medications at home. SBP is trending normotensive at this time. SBP goal is 120-140 post thrombectomy.  - Trend pressures  - Long term blood pressure goal is < 140/90.  #Hyperlipidemia From a stroke prevention stand point, the LDL goal is < 70. LDL is 132, Atorvastatin 40 mg started this admission.   RESP #Respiratory Failure requiring Intubation  Successfully extubated to 3L [place] on 8/17. Pulmonary toilet with  OOB to chair.   RENAL #AKI versus CKD  Cr elevated at 1.74, unclear what baseline Cr is. Will trend, encourage PO fluids if he passes bedside swallow evaluation.   GI  Pending bedside  swallow post extubation   ENDO #Prediabetes  Hemoglobin A1C this admission noted to be 5.9, in the prediabetic range. Will manage hyperglycemia with SSI if necessary   HEME #DVT Prophylaxis  Hemoglobin hematocrit and platelet count stable.  Using SCDs for Dvt Prophylaxis currently, will initiate Lovenox when he is 24 hours out from IVTPA.   ID #Leukocytosis  Afebrile. No infectious processes at this time. He has a mild leukocytosis at 10.9- are trending   Rt groin pseudoaneurysm : s/p thrombin injection 07/21/21 with resolution  Hospital day # 3     Patient is neurologically doing well.  Plan to resume IV heparin and start warfarin per pharmacy for his left ventricular apical clot as per cardiology.  Transfer to neurological floor bed.  Mobilize out of bed.  Therapy consults.  Continue close neurological observation  .  No family available at the bedside for discussion.  Discussed with Dr. Corliss Skains and Dr. Loreta Ave.  Long discussion with patient and wife and answered questions.  This patient is critically ill and at significant risk of neurological worsening, death and care requires constant monitoring of vital signs, hemodynamics,respiratory and cardiac monitoring, extensive review of multiple databases, frequent neurological assessment, discussion with family, other specialists and medical decision making of high complexity.I have made any additions or clarifications directly to the above note.This critical care time does not reflect procedure time, or teaching time or supervisory time of PA/NP/Med Resident etc but could involve care discussion time.  I spent 30 minutes of neurocritical care time  in the care of  this patient.      Delia Heady, MD Medical Director [facility] Pager: [phone] 07/22/2021 2:55 PM   To contact Stroke Continuity provider, please refer to [email or web address]. After hours, contact General Neurology

## 2021-07-22 NOTE — Progress Notes (Signed)
ANTICOAGULATION CONSULT NOTE   Pharmacy Consult for heparin Indication:  Apical thrombus (recent stroke)  No Known Allergies  Patient Measurements: Weight: 90.5 kg (199 lb 8.3 oz) Heparin Dosing Weight:   Vital Signs: Temp: 97.6 F (36.4 C) (08/19 0800) Temp Source: Oral (08/19 0800) BP: 138/86 (08/19 0715) Pulse Rate: 101 (08/19 0715)  Labs: Recent Labs    07/19/21 1947 07/19/21 1953 07/20/21 0105 07/20/21 0536 07/21/21 0432 07/21/21 1158 07/21/21 2317 07/22/21 0449  HGB 15.8 16.0   < > 14.4 12.0*  --   --  12.2*  HCT 48.9 47.0   < > 45.3 36.6*  --   --  37.9*  PLT 272  --   --  245 227  --   --  211  APTT 34  --   --   --   --   --   --   --   LABPROT 13.4  --   --   --   --   --   --   --   INR 1.0  --   --   --   --   --   --   --   HEPARINUNFRC  --   --   --   --  0.50 0.70 <0.10*  --   CREATININE 1.53* 1.50*  --  1.74* 1.70*  --   --   --    < > = values in this interval not displayed.     CrCl cannot be calculated (Unknown ideal weight.).   Medical History: Past Medical History:  Diagnosis Date   Hypertension     Medications:  Medications Prior to Admission  Medication Sig Dispense Refill Last Dose   enalapril (VASOTEC) 20 MG tablet Take 20 mg by mouth daily.   07/19/2021   hydrochlorothiazide (HYDRODIURIL) 12.5 MG tablet Take 12.5 mg by mouth daily.   07/19/2021   ibuprofen (ADVIL) 200 MG tablet Take 200 mg by mouth every 6 (six) hours as needed for mild pain.   07/17/2021    Assessment: 7 YOM who presented with an ischemic stroke s/p alteplase at 2000 on 8/16. 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 Pharmacy consulted to start IV heparin per stroke protocol. 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 Heparin drip turned off overnight for increased bruising/possible bleeding from groin area. Per discussion with stroke team Pearlean Brownie) this AM, issues seem to be resolved and ok to resume heparin now with warfarin  bridge. Patient was not on anticoagulation PTA.  Baseline INR 1.0 on 8/16. CBC stable. No current active bleed issues reported.  Goal of Therapy:  Heparin level 0.3-0.5 units/ml Monitor platelets by anticoagulation protocol: Yes   Plan:  Resume heparin drip at previous rate 1300 units/hr Start warfarin 4mg  PO x 1 dose at 1600 Check 8hr heparin level Monitor daily INR/CBC, s/sx bleeding   , PharmD, BCPS Please check AMION for all [facility] Pharmacy contact numbers Clinical Pharmacist 07/22/2021 9:28 AM

## 2021-07-22 NOTE — Progress Notes (Signed)
Progress Note  Patient Name: Roberto Dickson Date of Encounter: 07/22/2021  [facility] Cardiologist: Jodelle Red, MD   Subjective   Denies any CP or SOB. Wife at bedside  Inpatient Medications    Scheduled Meds:  atorvastatin  40 mg Oral Daily   Chlorhexidine Gluconate Cloth  6 each Topical Daily   docusate  100 mg Oral BID   pantoprazole  40 mg Oral QHS   polyethylene glycol  17 g Oral Daily   sodium chloride flush  3 mL Intravenous Once   tamsulosin  0.4 mg Oral Daily   Continuous Infusions:  sodium chloride     heparin     PRN Meds: acetaminophen **OR** acetaminophen (TYLENOL) oral liquid 160 mg/5 mL **OR** acetaminophen, fentaNYL (SUBLIMAZE) injection, fentaNYL (SUBLIMAZE) injection, senna-docusate, thrombin   Vital Signs    Vitals:   07/22/21 0645 07/22/21 0700 07/22/21 0715 07/22/21 0800  BP: 127/88 (!) 142/72 138/86   Pulse: 98 84 (!) 101   Resp: 20 (!) 21 (!) 23   Temp:    97.6 F (36.4 C)  TempSrc:    Oral  SpO2: 92% 90% 92%   Weight:        Intake/Output Summary (Last 24 hours) at 07/22/2021 0957 Last data filed at 07/22/2021 0600 Gross per 24 hour  Intake 1010.85 ml  Output 2745 ml  Net -1734.15 ml   Last 3 Weights 07/19/2021  Weight (lbs) 199 lb 8.3 oz  Weight (kg) 90.5 kg      Telemetry    Sinus tachycardia, HR 90-110s. Frequent PACs and PVCs - Personally Reviewed  ECG    NSR without significant ST-T wave changes, q wave in the inferior leads - Personally Reviewed  Physical Exam   GEN: No acute distress.   Neck: No JVD Cardiac: RRR, no murmurs, rubs, or gallops.  Respiratory: Clear to auscultation bilaterally. GI: Soft, nontender, non-distended  MS: No edema; No deformity. Neuro:  Nonfocal  Psych: Normal affect   Labs    High Sensitivity Troponin:  No results for input(s): TROPONINIHS in the last 720 hours.    Chemistry Recent Labs  Lab 07/19/21 1947 07/19/21 1953 07/20/21 0105 07/20/21 0536  07/21/21 0432  NA 140 141 142 142 141  K 3.9 3.9 4.6 4.0 3.7  CL 109 111  --  113* 106  CO2 22  --   --  20* 19*  GLUCOSE 77 76  --  180* 121*  BUN 25* 30*  --  27* 27*  CREATININE 1.53* 1.50*  --  1.74* 1.70*  CALCIUM 8.8*  --   --  8.1* 8.4*  PROT 6.9  --   --   --   --   ALBUMIN 3.4*  --   --   --   --   AST 23  --   --   --   --   ALT 15  --   --   --   --   ALKPHOS 54  --   --   --   --   BILITOT 1.1  --   --   --   --   GFRNONAA 46*  --   --  40* 41*  ANIONGAP 9  --   --  9 16*     Hematology Recent Labs  Lab 07/20/21 0536 07/21/21 0432 07/22/21 0449  WBC 10.9* 17.2* 12.0*  RBC 4.68 3.87* 3.89*  HGB 14.4 12.0* 12.2*  HCT 45.3 36.6* 37.9*  MCV  96.8 94.6 97.4  MCH 30.8 31.0 31.4  MCHC 31.8 32.8 32.2  RDW 14.3 14.6 14.7  PLT 245 227 211    BNP Recent Labs  Lab 07/21/21 0432  BNP 82.3     DDimer No results for input(s): DDIMER in the last 168 hours.   Radiology    CT PELVIS WO CONTRAST  Result Date: 07/21/2021 CLINICAL DATA:  History of pseudoaneurysm in the right common femoral artery following recent arterial intervention, status post thrombin injection EXAM: CT OF THE LOWER BILATERAL EXTREMITY WITHOUT CONTRAST TECHNIQUE: Multidetector CT imaging of the pelvis and bilateral lower extremities was performed according to the standard protocol. COMPARISON:  None. FINDINGS: Bones/Joint/Cartilage Degenerative changes of the lumbar spine are noted. No acute fracture or dislocation is seen. Ligaments Suboptimally assessed by CT. Muscles and Tendons Surrounding musculature appears within normal limits. Soft tissues Vascular calcifications are identified. No aneurysmal dilatation is noted. Rounded soft tissue density is noted just anterior to the could right common femoral artery inferiorly consistent with the known recently treated pseudoaneurysm. Lack of contrast limits evaluation of any residual patency within the pseudoaneurysm. Mild soft tissue swelling is noted  beneath the skin and extending into the proximal and medial right thigh. No discrete hemorrhage is noted. This likely represents previous hemorrhage and underlying bruising related to the manual compression from prior arterial intervention. No rapidly expanding hematoma is noted. Pelvic structures as visualized are within normal limits. Foley catheter is noted within the bladder. IMPRESSION: Mild soft tissue changes in the inguinal region and extending into the medial and anterior thigh on the right consistent with mild bruising from the recent arterial intervention. Rounded soft tissue density is noted anterior to the right common femoral artery consistent with the treated pseudoaneurysm. Lack of IV contrast limits evaluation for residual flow. No other focal abnormality is noted. Electronically Signed   By: Alcide Clever M.D.   On: 07/21/2021 20:21   MR BRAIN WO CONTRAST  Result Date: 07/20/2021 CLINICAL DATA:  Neuro deficit, acute, stroke suspected. Left ICA occlusion. EXAM: MRI HEAD WITHOUT CONTRAST TECHNIQUE: Multiplanar, multiecho pulse sequences of the brain and surrounding structures were obtained without intravenous contrast. COMPARISON:  CT studies done yesterday. FINDINGS: Brain: Diffusion imaging shows fiber 6 punctate infarctions within the left cerebellum, acute infarction of the left anteromedial hippocampus, and a few scattered infarctions in the posterior frontal and parietal regions on the left. The largest confluent infarction is about 1.5 cm in size and probably affects the precentral gyrus. In the right hemisphere, there are scattered punctate infarctions in the occipital lobe and frontal lobe. No large confluent infarction. No evidence of swelling or acute hemorrhage. There chronic small-vessel ischemic changes of the pons. There are a few older small vessel cerebellar infarctions on the left. There is an old right frontal cortical and subcortical infarction. There is an old left occipital  cortical and subcortical infarction with hemosiderin deposition. No evidence of mass, hydrocephalus or extra-axial collection. Vascular: Major vessels at the base of the brain show flow. 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 No large confluent acute infarction. The single largest acute insult is about a cm and a half in size and located in the left posterior frontal region, probably the precentral gyrus. Electronically Signed   By: Paulina Fusi M.D.   On: 07/20/2021 19:28   CT FEMUR LEFT WO CONTRAST  Result Date: 07/21/2021 CLINICAL DATA:  History of pseudoaneurysm in the right common femoral artery following recent arterial intervention, status post thrombin injection EXAM: CT OF THE LOWER BILATERAL EXTREMITY WITHOUT CONTRAST TECHNIQUE: Multidetector CT imaging of the pelvis and bilateral lower extremities was performed according to the standard protocol. COMPARISON:  None. FINDINGS: Bones/Joint/Cartilage Degenerative changes of the lumbar spine are noted. No acute fracture or dislocation is seen. Ligaments Suboptimally assessed by CT. Muscles and Tendons Surrounding musculature appears within normal limits. Soft tissues Vascular calcifications are identified. No aneurysmal dilatation is noted. Rounded soft tissue density is noted just anterior to the could right common femoral artery inferiorly consistent with the known recently treated pseudoaneurysm. Lack of contrast limits evaluation of any residual patency within the pseudoaneurysm. Mild soft tissue swelling is noted beneath the skin and extending into the proximal and medial right thigh. No discrete hemorrhage is noted. This likely represents previous hemorrhage and underlying bruising related to the manual compression from prior arterial intervention. No rapidly  expanding hematoma is noted. Pelvic structures as visualized are within normal limits. Foley catheter is noted within the bladder. IMPRESSION: Mild soft tissue changes in the inguinal region and extending into the medial and anterior thigh on the right consistent with mild bruising from the recent arterial intervention. Rounded soft tissue density is noted anterior to the right common femoral artery consistent with the treated pseudoaneurysm. Lack of IV contrast limits evaluation for residual flow. No other focal abnormality is noted. Electronically Signed   By: Alcide Clever M.D.   On: 07/21/2021 20:21   CT FEMUR RIGHT WO CONTRAST  Result Date: 07/21/2021 CLINICAL DATA:  History of pseudoaneurysm in the right common femoral artery following recent arterial intervention, status post thrombin injection EXAM: CT OF THE LOWER BILATERAL EXTREMITY WITHOUT CONTRAST TECHNIQUE: Multidetector CT imaging of the pelvis and bilateral lower extremities was performed according to the standard protocol. COMPARISON:  None. FINDINGS: Bones/Joint/Cartilage Degenerative changes of the lumbar spine are noted. No acute fracture or dislocation is seen. Ligaments Suboptimally assessed by CT. Muscles and Tendons Surrounding musculature appears within normal limits. Soft tissues Vascular calcifications are identified. No aneurysmal dilatation is noted. Rounded soft tissue density is noted just anterior to the could right common femoral artery inferiorly consistent with the known recently treated pseudoaneurysm. Lack of contrast limits evaluation of any residual patency within the pseudoaneurysm. Mild soft tissue swelling is noted beneath the skin and extending into the proximal and medial right thigh. No discrete hemorrhage is noted. This likely represents previous hemorrhage and underlying bruising related to the manual compression from prior arterial intervention. No rapidly expanding hematoma is noted. Pelvic structures as visualized  are within normal limits. Foley catheter is noted within the bladder. IMPRESSION: Mild soft tissue changes in the inguinal region and extending into the medial and anterior thigh on the right consistent with mild bruising from the recent arterial intervention. Rounded soft tissue density is noted anterior to the right common femoral artery consistent with the treated pseudoaneurysm. Lack of IV contrast limits evaluation for residual flow. No other focal abnormality is noted. Electronically Signed   By: Alcide Clever M.D.   On: 07/21/2021 20:21   IR Fluoro Guide Ndl Plmt / BX  Result Date: 07/21/2021 INDICATION: 81 year old male, status post mechanical thrombectomy for emergent large vessel occlusion, now with symptomatic right common femoral artery pseudoaneurysm EXAM: ULTRASOUND-GUIDED TREATMENT OF RIGHT COMMON FEMORAL ARTERY PSEUDOANEURYSM WITH THROMBIN INJECTION MEDICATIONS: 5000 units reconstituted bovine thrombin ANESTHESIA/SEDATION: The patient's level of  consciousness and vital signs were monitored continuously by radiology nursing throughout the procedure under my direct supervision. FLUOROSCOPY TIME:  NONE COMPLICATIONS: NONE PROCEDURE: Informed written consent was obtained from the patient by way of Spanish interpreter, after a thorough discussion of the procedural risks, benefits and alternatives. All questions were addressed. Maximal Sterile Barrier Technique was utilized including caps, mask, sterile gowns, sterile gloves, sterile drape, hand hygiene and skin antiseptic. A timeout was performed prior to the initiation of the procedure. Patient was positioned supine position on the stretcher. The right hip was prepped and draped in the usual sterile fashion. 1% lidocaine was used for local anesthesia. 5000 units of bovine thrombin was reconstituted. Using ultrasound guidance, a 22 gauge needle was advanced into the dome of the pseudoaneurysm of the right common femoral artery. Under real-time  ultrasound, small aliquots of thrombin were infused into the superficial aspect of the pseudoaneurysm until there was no flow detected. Needle was removed. We confirmed that the right anterior tibial artery and posterior tibial artery pulses remained palpable. Sterile dressing was placed. Patient tolerated the procedure well and remained hemodynamically stable throughout. No complications were encountered and no significant blood loss. FINDINGS: The initial ultrasound images demonstrates a diameter of greater than 3 cm, indicating that the pseudoaneurysm head grown since the comparison. No flow detected at the completion, with heterogeneously hyperechoic echotexture throughout at the conclusion. IMPRESSION: Status post ultrasound-guided thrombin injection for treatment of symptomatic right common femoral artery pseudoaneurysm. Signed, Yvone Neu. Reyne Dumas, RPVI [facility] Electronically Signed   By: Gilmer Mor D.O.   On: 07/21/2021 16:48   DG CHEST PORT 1 VIEW  Result Date: 07/21/2021 CLINICAL DATA:  CHF EXAM: PORTABLE CHEST 1 VIEW COMPARISON:  None. FINDINGS: Enlarged cardiomediastinal silhouette. Low lung volumes with bibasilar subsegmental atelectasis. Mild lower lung predominant interstitial opacities. Small bilateral pleural effusions. No visible pneumothorax. Bilateral shoulder degenerative changes. No acute osseous abnormality. IMPRESSION: Cardiomegaly with interstitial pulmonary edema and small bilateral pleural effusions. Low lung volumes. Electronically Signed   By: Caprice Renshaw M.D.   On: 07/21/2021 13:59   VAS [place] GROIN PSEUDOANEURYSM  Result Date: 07/21/2021  ARTERIAL PSEUDOANEURYSM  Patient Name:  LONZO SAULTER  Date of Exam:   07/21/2021 Medical Rec #: [id number]          Accession #:    [id number] Date of Birth: September 26, 1941          Patient Gender: M Patient Age:   80 years Exam Location:  [facility] Procedure:      VAS [place]  Bobetta Lime Referring Phys: Lannette Donath MATTHEWS --------------------------------------------------------------------------------  Exam: Right groin Indications: Patient complains of groin pain and bruising. History: Status post mechanical thrombectomy and revascularization of occluded Lt ICA extracranially and intracranially and Lt Lt MCA at origin. Comparison Study: No prior study Performing Technologist: Sherren Kerns RVS  Examination Guidelines: A complete evaluation includes B-mode imaging, spectral Doppler, color Doppler, and power Doppler as needed of all accessible portions of each vessel. Bilateral testing is considered an integral part of a complete examination. Limited examinations for reoccurring indications may be performed as noted.  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 The neck measures approximately 0.9 cm wide and 1.3 cm long.  Diagnosing physician: Lemar Livings MD Electronically signed by Lemar Livings MD on 07/21/2021 at 1:34:48 PM.   --------------------------------------------------------------------------------  Final    ECHOCARDIOGRAM COMPLETE  Result Date: 07/20/2021    ECHOCARDIOGRAM REPORT   Patient Name:   Rito EhrlichCARLOS A Jaspers Date of Exam: 07/20/2021 Medical Rec #:  [id number]         Height: Accession #:    [id number]        Weight:       199.5 lb Date of Birth:  09-19-1941         BSA:          2.016 m Patient Age:    80 years          BP:           106/79 mmHg Patient Gender: M                 HR:           83 bpm. Exam Location:  Inpatient Procedure: 2D Echo, Cardiac Doppler, Color Doppler and Intracardiac            Opacification Agent Indications:    Stroke  History:        Patient has no prior history of Echocardiogram examinations.  Sonographer:    Roosvelt MaserRachel Lane RDCS Referring Phys: [id number] [facility] Bridgeway[facility] KHALIQDINA   Sonographer Comments: Global longitudinal strain was attempted. IMPRESSIONS  1. 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 Apical, apical anterior, and apical septal akinesis. Inferoseptal hypokinesis. Left ventricular ejection fraction, by estimation, is 50 to 55%. The left ventricle has low normal function. The left ventricle demonstrates regional wall motion abnormalities (see scoring diagram/findings for description). Left ventricular diastolic parameters are consistent with Grade I diastolic dysfunction (impaired relaxation).  2. Right ventricular systolic function is normal. The right ventricular size is normal.  3. The mitral valve is normal in structure. Trivial mitral valve regurgitation. No evidence of mitral stenosis.  4. The aortic valve is normal in structure. Aortic valve regurgitation is not visualized. No aortic stenosis is present.  5. The inferior vena cava is normal in size with greater than 50% respiratory variability, suggesting right atrial pressure of 3 mmHg. FINDINGS  Left Ventricle: Apical thrombus measuring 2.17 cm x 1.67 cm. Apical, apical anterior, and apical septal akinesis. Inferoseptal hypokinesis. Left ventricular ejection fraction, by estimation, is 50 to 55%. The left ventricle has low normal function. The left ventricle demonstrates regional wall motion abnormalities. Definity contrast agent was given IV to delineate the left ventricular endocardial borders. The left ventricular internal cavity size was normal in size. There is no left ventricular hypertrophy. Left ventricular diastolic parameters are consistent with Grade I diastolic dysfunction (impaired relaxation). Normal left ventricular filling pressure. Right Ventricle: The right ventricular size is normal. No increase in right ventricular wall thickness. Right ventricular systolic function is normal. Left Atrium: Left atrial size was normal in size. Right Atrium: Right atrial size was normal in size. Pericardium: There is  no evidence of pericardial effusion. Mitral Valve: The mitral valve is normal in structure. Trivial mitral valve regurgitation. No evidence of mitral valve stenosis. Tricuspid Valve: The tricuspid valve is normal in structure. Tricuspid valve regurgitation is not demonstrated. No evidence of tricuspid stenosis. Aortic Valve: The aortic valve is normal in structure. Aortic valve regurgitation is not visualized. No aortic stenosis is present. Pulmonic Valve: The pulmonic valve was normal in structure. Pulmonic valve regurgitation is not visualized. No evidence of pulmonic stenosis. Aorta: The aortic root is normal in size and structure. Venous: The inferior vena cava is normal in size with greater than  50% respiratory variability, suggesting right atrial pressure of 3 mmHg. IAS/Shunts: No atrial level shunt detected by color flow Doppler.   LV Volumes (MOD) LV vol s, MOD A4C: 76.1 ml Diastology                            LV e' medial:    3.15 cm/s                            LV E/e' medial:  9.5                            LV e' lateral:   6.31 cm/s                            LV E/e' lateral: 4.8  LEFT ATRIUM           Index LA Vol (A4C): 44.3 ml 21.97 ml/m  MITRAL VALVE MV Area (PHT): 4.19 cm MV Decel Time: 181 msec MV E velocity: 30.00 cm/s MV A velocity: 75.40 cm/s MV E/A ratio:  0.40 Chilton Si MD Electronically signed by Chilton Si MD Signature Date/Time: 07/20/2021/4:46:12 PM    Final     Cardiac Studies   Echo 07/20/2021  1. 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 Apical, apical anterior,  and apical septal akinesis. Inferoseptal hypokinesis. Left ventricular  ejection fraction, by estimation, is 50 to 55%. The left ventricle has low  normal function. The left  ventricle demonstrates regional wall motion abnormalities (see scoring  diagram/findings for description). Left ventricular diastolic parameters  are consistent with Grade I diastolic dysfunction (impaired relaxation).   2.  Right ventricular systolic function is normal. The right ventricular  size is normal.   3. The mitral valve is normal in structure. Trivial mitral valve  regurgitation. No evidence of mitral stenosis.   4. The aortic valve is normal in structure. Aortic valve regurgitation is  not visualized. No aortic stenosis is present.   5. The inferior vena cava is normal in size with greater than 50%  respiratory variability, suggesting right atrial pressure of 3 mmHg.   Patient Profile     80 y.o. male with PMH of HTN presented with acute CVA and found to have LV thrombus on echo.   Assessment & Plan    Acute CVA  - MRI brain showed scattered acute infarctions within left cerebellum and both cerebral hemispheres, consistent with cardiac or ascending aortic source  - s/p tPA and L ICA thrombectomy  - extubated 8/17  LV thrombus  - Echo 8/17 showed apical thrombus measuring 2/17 cm x 1.67 cm, apical, apical anterior and apical septal akinesis, inferoseptal hypokinesis, EF 50-55%  - no sign of afib. HR tachycardic, appears to be sinus with PAC and PVCs, did not add BB yet to allow permissive hypertension.   - noted wall motion abnormality on echo, given lack of chest pain, plan for outpatient evaluation once recover from stroke  HTN  R groin pseudoaneurysm: s/p thrombin injection 8/18. Pending doppler today, if pseudoanuerysm is under control, will plan to start on coumadin with heparin bridge this afternoon.   Acute on chronic renal insufficiency      For questions or updates, please contact [facility] Please consult [email or web address] for contact info under  Ramond Dial, PA  07/22/2021, 9:57 AM

## 2021-07-22 NOTE — Progress Notes (Signed)
Alerted Dr. Ezzie Dural that the patient's sys bp>140.  (151/83 (100)).  Was given orders to alert MD if the sys becomes >160 and then he would treat it.  Will continue to observe and act accordingly.

## 2021-07-22 NOTE — Progress Notes (Signed)
Physical Therapy Treatment Patient Details Name: Roberto Dickson MRN: [id number] DOB: Jan 19, 1941 Today's Date: 07/22/2021    History of Present Illness Pt is a 80 y.o. male who presented 07/19/21 with L gaze deviation, R facial droop, R weakness, and aphasia. tPA was administered. 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 S/p revascularization of L ICA 8/16. ETT 8/16-8/17. PMH: HTN    PT Comments    Pt is displaying improved balance as he is no longer leaning posteriorly with transfers or gait. He is requiring modAx2 for bed mobility and to take several steps with UE support due to his weakness and coordination deficits. Will continue to follow acutely. Current recommendations remain appropriate.    Follow Up Recommendations  CIR;Supervision/Assistance - 24 hour     Equipment Recommendations  Rolling walker with 5" wheels;3in1 (PT)    Recommendations for Other Services       Precautions / Restrictions Precautions Precautions: Fall Precaution Comments: watch BP Restrictions Weight Bearing Restrictions: No    Mobility  Bed Mobility Overal bed mobility: Needs Assistance Bed Mobility: Rolling;Supine to Sit Rolling: +2 for physical assistance;Mod assist   Supine to sit: +2 for physical assistance;Mod assist     General bed mobility comments: pt able to progress bil LE toward EOB with (A) to place off  bed. pt reaching with R UE toward bed rail with decreased shoulder flexion, needing hand-over-hand to direct it to rail. pt pushing up with L UE to come to static sitting.    Transfers Overall transfer level: Needs assistance Equipment used: 2 person hand held assist Transfers: Sit to/from [employer] Sit to Stand: +2 physical assistance;Min assist;From elevated surface Stand pivot transfers: +2 physical assistance;Mod assist       General transfer comment: no RW in room to assess R UE on RW. pt with decreased grasp so at this time would need hand over hand to  sustain grasp. pt stepping to chair on R side. MinAx2 to power up to stand and steady, no sliding of feet this date. ModAx2 to steady and direct pt to step to R to chair, displaying R knee instability and difficulty lifting to place.  Ambulation/Gait Ambulation/Gait assistance: Mod assist;+2 physical assistance Gait Distance (Feet): 3 Feet Assistive device: 2 person hand held assist Gait Pattern/deviations: Decreased stride length;Decreased step length - right;Decreased dorsiflexion - right Gait velocity: reduced Gait velocity interpretation: <1.31 ft/sec, indicative of household ambulator General Gait Details: Pt with small lateral steps to R to transfer bed > recliner with bil HHA modAx2 to steady and direct. Instability noted in R knee but no appreciate buckling. Difficulty lifting, clearing, and placing R foot.   Stairs             Wheelchair Mobility    Modified Rankin (Stroke Patients Only) Modified Rankin (Stroke Patients Only) Pre-Morbid Rankin Score: No symptoms Modified Rankin: Moderately severe disability     Balance Overall balance assessment: Needs assistance Sitting-balance support: Single extremity supported;Feet supported Sitting balance-Leahy Scale: Poor Sitting balance - Comments: pt static sitting with min to min guard (A)   Standing balance support: Bilateral upper extremity supported;During functional activity Standing balance-Leahy Scale: Poor Standing balance comment: UE support and min-modAx2.                            Cognition Arousal/Alertness: Awake/alert Behavior During Therapy: Flat affect Overall Cognitive Status:  (to be further assessed for higher executive function. delayed responses to  some questions)                                 General Comments: pt answering questions with delay at times. pt able to follow 2 step commands. pt pleasant and laughing appropriately to joke as pt answered in english and not  spanish. wife explains that patient understands english but can not speak it well. Pt and wife at times giving greetings in [place]. Pt delays responses at times so will need further testing but for this session [facility] for what was assessed      Exercises Other Exercises Other Exercises: provided universal cuff with strap 5 crayons and a color by number. pt able to correctly understand instructions and color all the number 1 with this adapted task. pt does report fatigue with task.    General Comments General comments (skin integrity, edema, etc.): VSS with SBP <140      Pertinent Vitals/Pain Pain Assessment: No/denies pain    Home Living Family/patient expects to be discharged to:: Private residence Living Arrangements: Spouse/significant other Available Help at Discharge: Family;Available 24 hours/day Type of Home: Apartment Home Access: Elevator   Home Layout: One level Home Equipment: Grab bars - tub/shower      Prior Function Level of Independence: Independent      Comments: pt can understand some english but speaks spanish. pt asked color of item and states "red"   PT Goals (current goals can now be found in the care plan section) Acute Rehab PT Goals Patient Stated Goal: to get therapy and return to paint by numbers PT Goal Formulation: With patient/family Time For Goal Achievement: 08/03/21 Potential to Achieve Goals: Good Progress towards PT goals: Progressing toward goals    Frequency    Min 4X/week      PT Plan Current plan remains appropriate    Co-evaluation PT/OT/SLP Co-Evaluation/Treatment: Yes Reason for Co-Treatment: For patient/therapist safety;To address functional/ADL transfers PT goals addressed during session: Mobility/safety with mobility;Balance OT goals addressed during session: Proper use of Adaptive equipment and DME;ADL's and self-care;Strengthening/ROM      AM-PAC PT "6 Clicks" Mobility   Outcome Measure  Help needed turning from  your back to your side while in a flat bed without using bedrails?: A Lot Help needed moving from lying on your back to sitting on the side of a flat bed without using bedrails?: A Lot Help needed moving to and from a bed to a chair (including a wheelchair)?: A Lot Help needed standing up from a chair using your arms (e.g., wheelchair or bedside chair)?: A Lot Help needed to walk in hospital room?: A Lot Help needed climbing 3-5 steps with a railing? : Total 6 Click Score: 11    End of Session Equipment Utilized During Treatment: Gait belt Activity Tolerance: Patient tolerated treatment well Patient left: in chair;with call bell/phone within reach;with family/visitor present;with nursing/sitter in room (Rn aware no chair alarm present and being ordered by Diplomatic Services operational officer currently) Nurse Communication: Mobility status;Other (comment) (no chair alarm pad present and secretary ordering some currently) PT Visit Diagnosis: Unsteadiness on feet (R26.81);Other abnormalities of gait and mobility (R26.89);Muscle weakness (generalized) (M62.81);Difficulty in walking, not elsewhere classified (R26.2);Other symptoms and signs involving the nervous system (R29.898);Hemiplegia and hemiparesis Hemiplegia - Right/Left: Right Hemiplegia - dominant/non-dominant: Dominant Hemiplegia - caused by: Cerebral infarction     Time: 1248-1310 PT Time Calculation (min) (ACUTE ONLY): 22 min  Charges:  $Therapeutic Activity: 8-22  mins                     Raymond Gurney, PT, DPT Acute Rehabilitation Services  Pager: [phone] Office: [phone]    [name] 07/22/2021, 4:48 PM

## 2021-07-22 NOTE — Progress Notes (Signed)
ANTICOAGULATION CONSULT NOTE   Pharmacy Consult for heparin Indication:  Apical thrombus (recent stroke)  No Known Allergies  Patient Measurements: Weight: 90.5 kg (199 lb 8.3 oz) Heparin Dosing Weight:   Vital Signs: Temp: 97.7 F (36.5 C) (08/19 1600) Temp Source: Oral (08/19 1600) BP: 136/97 (08/19 1600) Pulse Rate: 113 (08/19 1600)  Labs: Recent Labs    07/19/21 1953 07/20/21 0105 07/20/21 0536 07/20/21 0536 07/21/21 0432 07/21/21 1158 07/21/21 2317 07/22/21 0449 07/22/21 1835  HGB 16.0   < > 14.4  --  12.0*  --   --  12.2*  --   HCT 47.0   < > 45.3  --  36.6*  --   --  37.9*  --   PLT  --   --  245  --  227  --   --  211  --   HEPARINUNFRC  --   --   --    < > 0.50 0.70 <0.10*  --  <0.10*  CREATININE 1.50*  --  1.74*  --  1.70*  --   --   --   --    < > = values in this interval not displayed.     CrCl cannot be calculated (Unknown ideal weight.).   Medical History: Past Medical History:  Diagnosis Date   Hypertension     Medications:  Medications Prior to Admission  Medication Sig Dispense Refill Last Dose   enalapril (VASOTEC) 20 MG tablet Take 20 mg by mouth daily.   07/19/2021   hydrochlorothiazide (HYDRODIURIL) 12.5 MG tablet Take 12.5 mg by mouth daily.   07/19/2021   ibuprofen (ADVIL) 200 MG tablet Take 200 mg by mouth every 6 (six) hours as needed for mild pain.   07/17/2021    Assessment: 31 YOM who presented with an ischemic stroke s/p alteplase at 2000 on 8/16. 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 Pharmacy consulted to start IV heparin per stroke protocol. 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 Heparin drip turned off overnight for increased bruising/possible bleeding from groin area. Per discussion with stroke team Pearlean Brownie) this AM, issues seem to be resolved and ok to resume heparin now with warfarin bridge. Patient was not on anticoagulation PTA.  Baseline INR 1.0 on 8/16. CBC stable.   Heparin  gtt stopped ~ 1730 PM for bleeding from IV site.  Heparin level drawn 1835 PM undetectable as expected.  RN resuming heparin now since IV site stopped bleeding per instructions from Dr. Pearlean Brownie.  Goal of Therapy:  Heparin level 0.3-0.5 units/ml Monitor platelets by anticoagulation protocol: Yes   Plan:  Resume IV heparin at 1300 units/hr. Check heparin level in 8 hrs. Daily heparin level and CBC.  Reece Leader, Colon Flattery, BCCP Clinical Pharmacist  07/22/2021 8:01 PM   [facility] pharmacy phone numbers are listed on [email or web address]

## 2021-07-22 NOTE — Progress Notes (Signed)
Limited right lower extremity arterial duplex (pseudoaneurysm evaluation) completed. Refer to "CV Proc" under chart review to view preliminary results.  07/22/2021 10:56 AM Eula Fried., MHA, RVT, RDCS, RDMS

## 2021-07-22 NOTE — Progress Notes (Signed)
OT NOTE  Pt expressed interest in coloring so OT providing tools to practice coloring that requries R UE activation.     07/22/21 1600  OT Visit Information  Last OT Received On 07/22/21  Assistance Needed +2  History of Present Illness Pt is a 80 y.o. male who presented 07/19/21 with L gaze deviation, R facial droop, R weakness, and aphasia. tPA was administered. 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 S/p revascularization of L ICA 8/16. ETT 8/16-8/17. PMH: HTN  Precautions  Precautions Fall  Precaution Comments watch BP  Pain Assessment  Pain Assessment No/denies pain  Cognition  Arousal/Alertness Awake/alert  Behavior During Therapy Flat affect  Overall Cognitive Status Within Functional Limits for tasks assessed  Upper Extremity Assessment  RUE Deficits / Details provided coloring task for R UE activation  Exercises  Exercises Other exercises  Other Exercises  Other Exercises provided universal cuff with strap 5 crayons and a color by number. pt able to correctly understand instructions and color all the number 1 with this adapted task. pt does report fatigue with task.  OT - End of Session  Activity Tolerance Patient tolerated treatment well  Patient left in chair;with call bell/phone within reach;with family/visitor present  Nurse Communication Mobility status;Precautions  OT Assessment/Plan  OT Plan Discharge plan remains appropriate  OT Visit Diagnosis Unsteadiness on feet (R26.81);Muscle weakness (generalized) (M62.81)  OT Frequency (ACUTE ONLY) Min 3X/week  Recommendations for Other Services Rehab consult  Follow Up Recommendations CIR  OT Equipment 3 in 1 bedside commode  AM-PAC OT "6 Clicks" Daily Activity Outcome Measure (Version 2)  Help from another person eating meals? 2  Help from another person taking care of personal grooming? 2  Help from another person toileting, which includes using toliet, bedpan, or urinal? 2  Help from another person bathing (including  washing, rinsing, drying)? 2  Help from another person to put on and taking off regular upper body clothing? 2  Help from another person to put on and taking off regular lower body clothing? 2  6 Click Score 12  Progressive Mobility  What is the highest level of mobility based on the progressive mobility assessment? Level 2 (Chairfast) - Balance while sitting on edge of bed and cannot stand  Mobility Out of bed for toileting  OT Goal Progression  Progress towards OT goals Progressing toward goals  Acute Rehab OT Goals  Patient Stated Goal to get therapy and return to paint by numbers  OT Goal Formulation With patient/family  Time For Goal Achievement 08/05/21  Potential to Achieve Goals Good  ADL Goals  Pt Will Perform Grooming with min assist;sitting  Pt Will Perform Upper Body Bathing with mod assist;sitting  Pt Will Transfer to Toilet with max assist;stand pivot transfer;bedside commode  Additional ADL Goal #1 pt will complete 50% of a paint by numbers activity with R UE using AE  Additional ADL Goal #2 pt will complete HEP R UE with written handout in spanish  OT Time Calculation  OT Start Time (ACUTE ONLY) 1500  OT Stop Time (ACUTE ONLY) 1516  OT Time Calculation (min) 16 min  OT General Charges  $OT Visit 1 Visit  OT Treatments  $Therapeutic Exercise 8-22 mins    Brynn, OTR/L  Acute Rehabilitation Services Pager: [phone] Office: [phone] .

## 2021-07-22 NOTE — Progress Notes (Signed)
Referring Physician(s): Dr. 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 Ardelle Anton   Subjective:  Patient laying in bed not in acute distress.  Vascular ultrasound and wife at the bedside. Wife states that patient is doing better, able to eat breakfast this morning.  Allergies: Patient has no known allergies.  Medications: Prior to Admission medications   Medication Sig Start Date End Date Taking? Authorizing Provider  enalapril (VASOTEC) 20 MG tablet Take 20 mg by mouth daily. 06/12/21  Yes [provider]  hydrochlorothiazide (HYDRODIURIL) 12.5 MG tablet Take 12.5 mg by mouth daily. 06/12/21  Yes [provider]  ibuprofen (ADVIL) 200 MG tablet Take 200 mg by mouth every 6 (six) hours as needed for mild pain.   Yes [provider]     Vital Signs: BP 138/86   Pulse (!) 101   Temp 97.6 F (36.4 C) (Oral)   Resp (!) 23   Wt 199 lb 8.3 oz (90.5 kg)   SpO2 92%   Physical Exam Vitals reviewed.  Constitutional:      General: He is not in acute distress.    Appearance: Normal appearance. He is not ill-appearing.  HENT:     Head: Normocephalic and atraumatic.  Pulmonary:     Effort: Pulmonary effort is normal.  Skin:    General: Skin is warm and dry.     Coloration: Skin is not jaundiced.     Findings: Bruising present.     Comments: Positive dressing on R CFA puncture site. Site is unremarkable with no erythema, edema, tenderness, bleeding or drainage. Minimal amount of old, dry blood note son the dressing. Dressing otherwise clean, dry, and intact.  Ecchymosis noted on  right groin, appears better than yesterday per Dr. Corliss Skains.    Neurological:     Mental Status: He is alert.    Imaging: CT PELVIS WO CONTRAST  Result Date: 07/21/2021 CLINICAL DATA:  History of pseudoaneurysm in the right common femoral artery following recent arterial intervention, status post thrombin injection EXAM: CT OF THE LOWER BILATERAL EXTREMITY WITHOUT CONTRAST TECHNIQUE: Multidetector CT imaging of the pelvis and bilateral lower extremities was performed according to the standard protocol. COMPARISON:  None. FINDINGS: Bones/Joint/Cartilage Degenerative changes of the lumbar spine are noted. No acute fracture or dislocation is seen. Ligaments Suboptimally assessed by CT. Muscles and Tendons Surrounding musculature appears within normal limits. Soft tissues Vascular calcifications are identified. No aneurysmal dilatation is noted. Rounded soft tissue density is noted just anterior to the could right common femoral artery inferiorly consistent with the known recently treated pseudoaneurysm. Lack of contrast limits evaluation of any residual patency within the pseudoaneurysm. Mild soft tissue swelling is noted beneath the skin and extending into the proximal and medial right thigh. No discrete hemorrhage is noted. This likely represents previous hemorrhage and underlying bruising related to the manual compression from prior arterial intervention. No rapidly expanding hematoma is noted. Pelvic structures as visualized are within normal limits. Foley catheter is noted within the bladder. IMPRESSION: Mild soft tissue changes in the inguinal region and extending into the medial and anterior thigh on the right consistent with mild bruising from  the recent arterial intervention. Rounded soft tissue density is noted anterior to the right common femoral artery consistent with the treated pseudoaneurysm. Lack of IV contrast limits evaluation for residual flow. No other focal abnormality is noted.  Electronically Signed   By: Alcide Clever M.D.   On: 07/21/2021 20:21   MR BRAIN WO CONTRAST  Result Date: 07/20/2021 CLINICAL DATA:  Neuro deficit, acute, stroke suspected. Left ICA occlusion. EXAM: MRI HEAD WITHOUT CONTRAST TECHNIQUE: Multiplanar, multiecho pulse sequences of the brain and surrounding structures were obtained without intravenous contrast. COMPARISON:  CT studies done yesterday. FINDINGS: Brain: Diffusion imaging shows fiber 6 punctate infarctions within the left cerebellum, acute infarction of the left anteromedial hippocampus, and a few scattered infarctions in the posterior frontal and parietal regions on the left. The largest confluent infarction is about 1.5 cm in size and probably affects the precentral gyrus. In the right hemisphere, there are scattered punctate infarctions in the occipital lobe and frontal lobe. No large confluent infarction. No evidence of swelling or acute hemorrhage. There chronic small-vessel ischemic changes of the pons. There are a few older small vessel cerebellar infarctions on the left. There is an old right frontal cortical and subcortical infarction. There is an old left occipital cortical and subcortical infarction with hemosiderin deposition. No evidence of mass, hydrocephalus or extra-axial collection. Vascular: Major vessels at the base of the brain show flow. 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 No large confluent acute infarction. The single largest acute insult is about a cm and a half in size and located in the left posterior frontal region, probably the precentral gyrus. Electronically Signed   By: Paulina Fusi M.D.   On: 07/20/2021 19:28   CT FEMUR LEFT WO CONTRAST  Result Date: 07/21/2021 CLINICAL DATA:  History of pseudoaneurysm in the right common  femoral artery following recent arterial intervention, status post thrombin injection EXAM: CT OF THE LOWER BILATERAL EXTREMITY WITHOUT CONTRAST TECHNIQUE: Multidetector CT imaging of the pelvis and bilateral lower extremities was performed according to the standard protocol. COMPARISON:  None. FINDINGS: Bones/Joint/Cartilage Degenerative changes of the lumbar spine are noted. No acute fracture or dislocation is seen. Ligaments Suboptimally assessed by CT. Muscles and Tendons Surrounding musculature appears within normal limits. Soft tissues Vascular calcifications are identified. No aneurysmal dilatation is noted. Rounded soft tissue density is noted just anterior to the could right common femoral artery inferiorly consistent with the known recently treated pseudoaneurysm. Lack of contrast limits evaluation of any residual patency within the pseudoaneurysm. Mild soft tissue swelling is noted beneath the skin and extending into the proximal and medial right thigh. No discrete hemorrhage is noted. This likely represents previous hemorrhage and underlying bruising related to the manual compression from prior arterial intervention. No rapidly expanding hematoma is noted. Pelvic structures as visualized are within normal limits. Foley catheter is noted within the bladder. IMPRESSION: Mild soft tissue changes in the inguinal region and extending into the medial and anterior thigh on the right consistent with mild bruising from the recent arterial intervention. Rounded soft tissue density is noted anterior to the right common femoral artery consistent with the treated pseudoaneurysm. Lack of IV contrast limits evaluation for residual flow. No other focal abnormality is noted. Electronically Signed   By: Alcide Clever M.D.   On: 07/21/2021 20:21   CT FEMUR RIGHT WO CONTRAST  Result Date:  07/21/2021 CLINICAL DATA:  History of pseudoaneurysm in the right common femoral artery following recent arterial intervention, status  post thrombin injection EXAM: CT OF THE LOWER BILATERAL EXTREMITY WITHOUT CONTRAST TECHNIQUE: Multidetector CT imaging of the pelvis and bilateral lower extremities was performed according to the standard protocol. COMPARISON:  None. FINDINGS: Bones/Joint/Cartilage Degenerative changes of the lumbar spine are noted. No acute fracture or dislocation is seen. Ligaments Suboptimally assessed by CT. Muscles and Tendons Surrounding musculature appears within normal limits. Soft tissues Vascular calcifications are identified. No aneurysmal dilatation is noted. Rounded soft tissue density is noted just anterior to the could right common femoral artery inferiorly consistent with the known recently treated pseudoaneurysm. Lack of contrast limits evaluation of any residual patency within the pseudoaneurysm. Mild soft tissue swelling is noted beneath the skin and extending into the proximal and medial right thigh. No discrete hemorrhage is noted. This likely represents previous hemorrhage and underlying bruising related to the manual compression from prior arterial intervention. No rapidly expanding hematoma is noted. Pelvic structures as visualized are within normal limits. Foley catheter is noted within the bladder. IMPRESSION: Mild soft tissue changes in the inguinal region and extending into the medial and anterior thigh on the right consistent with mild bruising from the recent arterial intervention. Rounded soft tissue density is noted anterior to the right common femoral artery consistent with the treated pseudoaneurysm. Lack of IV contrast limits evaluation for residual flow. No other focal abnormality is noted. Electronically Signed   By: Alcide Clever M.D.   On: 07/21/2021 20:21   IR CT Head Ltd  Result Date: 07/21/2021 INDICATION: Acute onset of right-sided weakness, left gaze deviation, and aphasia. 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 EXAM: 1. EMERGENT LARGE VESSEL OCCLUSION THROMBOLYSIS (anterior COMPARISON:  CT angiogram of the head and neck of July 19, 2021. MEDICATIONS: Ancef 2 g IV antibiotic was administered within 1 hour of the procedure. ANESTHESIA/SEDATION: General anesthesia. CONTRAST:  Omnipaque 240 60 mL. FLUOROSCOPY TIME:  Fluoroscopy Time: 38 minutes 18 seconds (1896 mGy). COMPLICATIONS: None immediate. TECHNIQUE: Following a full explanation of the procedure along with the potential associated complications, an informed witnessed consent was obtained. The risks of intracranial hemorrhage of 10%, worsening neurological deficit, ventilator dependency, death and inability to revascularize were all reviewed in detail with the patient's daughter. The patient was then put under general anesthesia by the Department of Anesthesiology at [facility]. The right groin was prepped and draped in the usual sterile fashion. Thereafter using modified Seldinger technique, transfemoral access into the right common femoral artery was obtained without difficulty. Over a 0.035 inch guidewire an 8 [place] Pinnacle 25 cm sheath was inserted. Through this, and also over a 0.035 inch guidewire a 5 Simmons 2 catheter was advanced to the aortic arch region and selectively positioned in left common carotid artery. FINDINGS: The left common carotid arteriogram demonstrates opacification of the left external carotid artery and its major branches to be grossly unremarkable. The left internal carotid artery at the bulb and just distally demonstrates stasis of contrast. No evidence of a more distal angiographic string sign. No distal reconstitution of the left ICA noted from the external carotid artery branches via the ophthalmic artery. PROCEDURE: The Simmons 2 diagnostic catheter was then exchanged over a 0.035 inch 300 cm Rosen exchange guidewire for an 087 balloon guide catheter which was positioned in the left internal carotid  artery bulb region.  Gentle control arteriogram performed through the balloon guide catheter demonstrated continuous stasis of contrast in the left internal carotid artery at the bulb and just distal to this. Over a 0.014 inch standard Synchro micro guidewire with a J-tip configuration, an 021 162 cm microcatheter inside of an 132 cm 71 Zoom aspiration catheter combination was advanced to the supraclinoid left ICA and then the left middle cerebral artery M2 M3 region followed by the microcatheter. The guidewire was removed. Good aspiration obtained from the hub of the microcatheter. A gentle control arteriogram performed through the microcatheter demonstrated safe position of the tip of the microcatheter with slow antegrade clearance of contrast. This was then connected to continuous heparinized saline infusion. A 6 mm x 40 mm Solitaire X retrieval device was then advanced to the distal end of the microcatheter and deployed in the usual manner such that the proximal portion was in the supraclinoid left ICA. The Zoom aspiration catheter was then advanced into the left middle cerebral artery proximally. Thereafter with proximal flow arrest in the left internal carotid artery, and constant aspiration applied at the hub of the balloon guide catheter with a 20 mL syringe, and at the hub of the Zoom aspiration catheter with Penumbra aspiration apparatus for approximately 2-1/2 minutes, the combination of the retrieval device, microcatheter, and the Zoom aspiration catheter were retrieved and removed. Following reversal of flow arrest, a control arteriogram performed through the balloon guide catheter in the proximal left ICA demonstrated revascularization of the left internal carotid artery proximally and distally. The left middle cerebral artery demonstrated wide patency achieving a TICI 3 revascularization. Also noted was opacification of the left anterior cerebral artery into the capillary and venous phases. Cross  filling via the anterior communicating artery of the right anterior cerebral A2 segment, and distal right A1 segment was noted. The balloon guide catheter was then retrieved into the left common carotid artery. A control arteriogram performed from this site demonstrated wide patency of the left internal carotid artery proximally and distally with no change in the complete revascularization of the left middle cerebral distribution. Balloon guide was then removed. The 8 French Pinnacle sheath was removed with manual compression held for 30 minutes with a quick clot for hemostasis. Distal pulses remained Dopplerable in both feet unchanged. A CT of the brain demonstrated no evidence of intracranial hemorrhage or mass effect. Patient was left intubated due to difficulty with communication due to language barrier prior to the procedure, and awaiting the results of the COVID-19 test. Patient was then transferred to the neuro ICU for post thrombectomy management. IMPRESSION: Status post endovascular complete revascularization of occluded left internal carotid artery proximally and distally to the terminus and the origin of the left middle cerebral artery with 1 pass with a 6 mm x 40 mm Solitaire X retrieval device, and contact aspiration achieving a TICI 3 revascularization. PLAN: Follow-up as per referring MD. Electronically Signed   By: Julieanne CottonSanjeev  Deveshwar M.D.   On: 07/20/2021 09:43   IR Fluoro Guide Ndl Plmt / BX  Result Date: 07/21/2021 INDICATION: 80 year old male, status post mechanical thrombectomy for emergent large vessel occlusion, now with symptomatic right common femoral artery pseudoaneurysm EXAM: ULTRASOUND-GUIDED TREATMENT OF RIGHT COMMON FEMORAL ARTERY PSEUDOANEURYSM WITH THROMBIN INJECTION MEDICATIONS: 5000 units reconstituted bovine thrombin ANESTHESIA/SEDATION: The patient's level of consciousness and vital signs were monitored continuously by radiology nursing throughout the procedure under my direct  supervision. FLUOROSCOPY TIME:  NONE COMPLICATIONS: NONE PROCEDURE: Informed written consent was obtained from the  patient by way of Spanish interpreter, after a thorough discussion of the procedural risks, benefits and alternatives. All questions were addressed. Maximal Sterile Barrier Technique was utilized including caps, mask, sterile gowns, sterile gloves, sterile drape, hand hygiene and skin antiseptic. A timeout was performed prior to the initiation of the procedure. Patient was positioned supine position on the stretcher. The right hip was prepped and draped in the usual sterile fashion. 1% lidocaine was used for local anesthesia. 5000 units of bovine thrombin was reconstituted. Using ultrasound guidance, a 22 gauge needle was advanced into the dome of the pseudoaneurysm of the right common femoral artery. Under real-time ultrasound, small aliquots of thrombin were infused into the superficial aspect of the pseudoaneurysm until there was no flow detected. Needle was removed. We confirmed that the right anterior tibial artery and posterior tibial artery pulses remained palpable. Sterile dressing was placed. Patient tolerated the procedure well and remained hemodynamically stable throughout. No complications were encountered and no significant blood loss. FINDINGS: The initial ultrasound images demonstrates a diameter of greater than 3 cm, indicating that the pseudoaneurysm head grown since the comparison. No flow detected at the completion, with heterogeneously hyperechoic echotexture throughout at the conclusion. IMPRESSION: Status post ultrasound-guided thrombin injection for treatment of symptomatic right common femoral artery pseudoaneurysm. Signed, Yvone Neu. Reyne Dumas, RPVI [facility] Radiology Electronically Signed   By: Gilmer Mor D.O.   On: 07/21/2021 16:48   DG CHEST PORT 1 VIEW  Result Date: 07/21/2021 CLINICAL DATA:  CHF EXAM: PORTABLE CHEST  1 VIEW COMPARISON:  None. FINDINGS: Enlarged cardiomediastinal silhouette. Low lung volumes with bibasilar subsegmental atelectasis. Mild lower lung predominant interstitial opacities. Small bilateral pleural effusions. No visible pneumothorax. Bilateral shoulder degenerative changes. No acute osseous abnormality. IMPRESSION: Cardiomegaly with interstitial pulmonary edema and small bilateral pleural effusions. Low lung volumes. Electronically Signed   By: Caprice Renshaw M.D.   On: 07/21/2021 13:59   DG CHEST PORT 1 VIEW  Result Date: 07/20/2021 CLINICAL DATA:  Check gastric catheter placement EXAM: PORTABLE CHEST 1 VIEW COMPARISON:  None. FINDINGS: Endotracheal tube is noted in satisfactory position. Cardiac shadow is enlarged. Poor inspiratory effort is noted with bibasilar atelectasis. Gastric catheter is seen within the stomach. No bony abnormality is noted. IMPRESSION: Tubes and lines in satisfactory position. Mild bibasilar atelectatic changes are seen. Electronically Signed   By: Alcide Clever M.D.   On: 07/20/2021 00:29   VAS [place] GROIN PSEUDOANEURYSM  Result Date: 07/21/2021  ARTERIAL PSEUDOANEURYSM  Patient Name:  Roberto Dickson  Date of Exam:   07/21/2021 Medical Rec #: [id number]          Accession #:    [id number] Date of Birth: 11/17/1941          Patient Gender: M Patient Age:   80 years Exam Location[facility] Procedure:      VAS [place] Bobetta Lime Referring Phys: Lannette Donath MATTHEWS --------------------------------------------------------------------------------  Exam: Right groin Indications: Patient complains of groin pain and bruising. History: Status post mechanical thrombectomy and revascularization of occluded Lt ICA extracranially and intracranially and Lt Lt MCA at origin. Comparison Study: No prior study Performing Technologist: Sherren Kerns RVS  Examination Guidelines: A complete evaluation includes B-mode imaging, spectral Doppler, color Doppler, and power Doppler as needed  of all accessible portions of each vessel. Bilateral testing is considered an integral part of a complete examination. Limited examinations for reoccurring indications may be performed as noted.  Findings: An area with  well defined borders measuring 2.1 cm x 2.7 cm was visualized arising off of the Common femoral artery with ultrasound characteristics of a partially thrombosed pseudoaneurysm. The neck measures approximately 0.9 cm wide and 1.3 cm long.  Diagnosing physician: Lemar Livings MD Electronically signed by Lemar Livings MD on 07/21/2021 at 1:34:48 PM.   --------------------------------------------------------------------------------    Final    ECHOCARDIOGRAM COMPLETE  Result Date: 07/20/2021    ECHOCARDIOGRAM REPORT   Patient Name:   Roberto Dickson Date of Exam: 07/20/2021 Medical Rec #:  [id number]         Height: Accession #:    [id number]        Weight:       199.5 lb Date of Birth:  1941-09-04         BSA:          2.016 m Patient Age:    80 years          BP:           106/79 mmHg Patient Gender: M                 HR:           83 bpm. Exam Location:  Inpatient Procedure: 2D Echo, Cardiac Doppler, Color Doppler and Intracardiac            Opacification Agent Indications:    Stroke  History:        Patient has no prior history of Echocardiogram examinations.  Sonographer:    Roosvelt Maser RDCS Referring Phys: [id number] [facility]  Sonographer Comments: Global longitudinal strain was attempted. IMPRESSIONS  1. 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 Apical, apical anterior, and apical septal akinesis. Inferoseptal hypokinesis. Left ventricular ejection fraction, by estimation, is 50 to 55%. The left ventricle has low normal function. The left ventricle demonstrates regional wall motion abnormalities (see scoring diagram/findings for description). Left ventricular diastolic parameters are consistent with Grade I diastolic dysfunction (impaired relaxation).  2. Right ventricular systolic  function is normal. The right ventricular size is normal.  3. The mitral valve is normal in structure. Trivial mitral valve regurgitation. No evidence of mitral stenosis.  4. The aortic valve is normal in structure. Aortic valve regurgitation is not visualized. No aortic stenosis is present.  5. The inferior vena cava is normal in size with greater than 50% respiratory variability, suggesting right atrial pressure of 3 mmHg. FINDINGS  Left Ventricle: Apical thrombus measuring 2.17 cm x 1.67 cm. Apical, apical anterior, and apical septal akinesis. Inferoseptal hypokinesis. Left ventricular ejection fraction, by estimation, is 50 to 55%. The left ventricle has low normal function. The left ventricle demonstrates regional wall motion abnormalities. Definity contrast agent was given IV to delineate the left ventricular endocardial borders. The left ventricular internal cavity size was normal in size. There is no left ventricular hypertrophy. Left ventricular diastolic parameters are consistent with Grade I diastolic dysfunction (impaired relaxation). Normal left ventricular filling pressure. Right Ventricle: The right ventricular size is normal. No increase in right ventricular wall thickness. Right ventricular systolic function is normal. Left Atrium: Left atrial size was normal in size. Right Atrium: Right atrial size was normal in size. Pericardium: There is no evidence of pericardial effusion. Mitral Valve: The mitral valve is normal in structure. Trivial mitral valve regurgitation. No evidence of mitral valve stenosis. Tricuspid Valve: The tricuspid valve is normal in structure. Tricuspid valve regurgitation is not demonstrated. No evidence of tricuspid stenosis. Aortic Valve: The  aortic valve is normal in structure. Aortic valve regurgitation is not visualized. No aortic stenosis is present. Pulmonic Valve: The pulmonic valve was normal in structure. Pulmonic valve regurgitation is not visualized. No evidence of  pulmonic stenosis. Aorta: The aortic root is normal in size and structure. Venous: The inferior vena cava is normal in size with greater than 50% respiratory variability, suggesting right atrial pressure of 3 mmHg. IAS/Shunts: No atrial level shunt detected by color flow Doppler.   LV Volumes (MOD) LV vol s, MOD A4C: 76.1 ml Diastology                            LV e' medial:    3.15 cm/s                            LV E/e' medial:  9.5                            LV e' lateral:   6.31 cm/s                            LV E/e' lateral: 4.8  LEFT ATRIUM           Index LA Vol (A4C): 44.3 ml 21.97 ml/m  MITRAL VALVE MV Area (PHT): 4.19 cm MV Decel Time: 181 msec MV E velocity: 30.00 cm/s MV A velocity: 75.40 cm/s MV E/A ratio:  0.40 Chilton Si MD Electronically signed by Chilton Si MD Signature Date/Time: 07/20/2021/4:46:12 PM    Final    IR PERCUTANEOUS ART THROMBECTOMY/INFUSION INTRACRANIAL INC DIAG ANGIO  Result Date: 07/21/2021 INDICATION: Acute onset of right-sided weakness, left gaze deviation, and aphasia. 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 EXAM: 1. EMERGENT LARGE VESSEL OCCLUSION THROMBOLYSIS (anterior COMPARISON:  CT angiogram of the head and neck of July 19, 2021. MEDICATIONS: Ancef 2 g IV antibiotic was administered within 1 hour of the procedure. ANESTHESIA/SEDATION: General anesthesia. CONTRAST:  Omnipaque 240 60 mL. FLUOROSCOPY TIME:  Fluoroscopy Time: 38 minutes 18 seconds (1896 mGy). COMPLICATIONS: None immediate. TECHNIQUE: Following a full explanation of the procedure along with the potential associated complications, an informed witnessed consent was obtained. The risks of intracranial hemorrhage of 10%, worsening neurological deficit, ventilator dependency, death and inability to revascularize were all reviewed in detail with the patient's daughter. The patient was then put under general  anesthesia by the Department of Anesthesiology at [facility]. The right groin was prepped and draped in the usual sterile fashion. Thereafter using modified Seldinger technique, transfemoral access into the right common femoral artery was obtained without difficulty. Over a 0.035 inch guidewire an 8 [place] Pinnacle 25 cm sheath was inserted. Through this, and also over a 0.035 inch guidewire a 5 Simmons 2 catheter was advanced to the aortic arch region and selectively positioned in left common carotid artery. FINDINGS: The left common carotid arteriogram demonstrates opacification of the left external carotid artery and its major branches to be grossly unremarkable. The left internal carotid artery at the bulb and just distally demonstrates stasis of contrast. No evidence of a more distal angiographic string sign. No distal reconstitution of the left ICA noted from the external carotid artery branches via the ophthalmic artery. PROCEDURE: The [facility] 2 diagnostic catheter  was then exchanged over a 0.035 inch 300 cm Pollyann Kennedy exchange guidewire for an 087 balloon guide catheter which was positioned in the left internal carotid artery bulb region. Gentle control arteriogram performed through the balloon guide catheter demonstrated continuous stasis of contrast in the left internal carotid artery at the bulb and just distal to this. Over a 0.014 inch standard Synchro micro guidewire with a J-tip configuration, an 021 162 cm microcatheter inside of an 132 cm 71 Zoom aspiration catheter combination was advanced to the supraclinoid left ICA and then the left middle cerebral artery M2 M3 region followed by the microcatheter. The guidewire was removed. Good aspiration obtained from the hub of the microcatheter. A gentle control arteriogram performed through the microcatheter demonstrated safe position of the tip of the microcatheter with slow antegrade clearance of contrast. This was then connected to continuous  heparinized saline infusion. A 6 mm x 40 mm Solitaire X retrieval device was then advanced to the distal end of the microcatheter and deployed in the usual manner such that the proximal portion was in the supraclinoid left ICA. The Zoom aspiration catheter was then advanced into the left middle cerebral artery proximally. Thereafter with proximal flow arrest in the left internal carotid artery, and constant aspiration applied at the hub of the balloon guide catheter with a 20 mL syringe, and at the hub of the Zoom aspiration catheter with Penumbra aspiration apparatus for approximately 2-1/2 minutes, the combination of the retrieval device, microcatheter, and the Zoom aspiration catheter were retrieved and removed. Following reversal of flow arrest, a control arteriogram performed through the balloon guide catheter in the proximal left ICA demonstrated revascularization of the left internal carotid artery proximally and distally. The left middle cerebral artery demonstrated wide patency achieving a TICI 3 revascularization. Also noted was opacification of the left anterior cerebral artery into the capillary and venous phases. Cross filling via the anterior communicating artery of the right anterior cerebral A2 segment, and distal right A1 segment was noted. The balloon guide catheter was then retrieved into the left common carotid artery. A control arteriogram performed from this site demonstrated wide patency of the left internal carotid artery proximally and distally with no change in the complete revascularization of the left middle cerebral distribution. Balloon guide was then removed. The 8 French Pinnacle sheath was removed with manual compression held for 30 minutes with a quick clot for hemostasis. Distal pulses remained Dopplerable in both feet unchanged. A CT of the brain demonstrated no evidence of intracranial hemorrhage or mass effect. Patient was left intubated due to difficulty with communication due  to language barrier prior to the procedure, and awaiting the results of the COVID-19 test. Patient was then transferred to the neuro ICU for post thrombectomy management. IMPRESSION: Status post endovascular complete revascularization of occluded left internal carotid artery proximally and distally to the terminus and the origin of the left middle cerebral artery with 1 pass with a 6 mm x 40 mm Solitaire X retrieval device, and contact aspiration achieving a TICI 3 revascularization. PLAN: Follow-up as per referring MD. Electronically Signed   By: Julieanne Cotton M.D.   On: 07/20/2021 09:43   CT HEAD CODE STROKE WO CONTRAST  Result Date: 07/19/2021 CLINICAL DATA:  Code stroke.  Acute neurologic deficit EXAM: CT HEAD WITHOUT CONTRAST TECHNIQUE: Contiguous axial images were obtained from the base of the skull through the vertex without intravenous contrast. COMPARISON:  None. FINDINGS: Brain: There is no mass, hemorrhage or extra-axial collection. The  size and configuration of the ventricles and extra-axial CSF spaces are normal. 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 Vascular: No abnormal hyperdensity of the major intracranial arteries or dural venous sinuses. No intracranial atherosclerosis. Skull: The visualized skull base, calvarium and extracranial soft tissues are normal. Sinuses/Orbits: No fluid levels or advanced mucosal thickening of the visualized paranasal sinuses. No mastoid or middle ear effusion. The orbits are normal. ASPECTS [facility] Stroke Program Early CT Score) - Ganglionic level infarction (caudate, lentiform nuclei, internal capsule, insula, M1-M3 cortex): 7 - Supraganglionic infarction (M4-M6 cortex): 3 Total score (0-10 with 10 being normal): 10 IMPRESSION: 1. No acute intracranial abnormality. 2. 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 3. ASPECTS is 10. 4. These results were called by telephone at the time of interpretation on 07/19/2021 at 8:00 pm to provider SAL [facility] , who verbally  acknowledged these results. Electronically Signed   By: Deatra Robinson M.D.   On: 07/19/2021 20:01   CT ANGIO HEAD NECK W WO CM (CODE STROKE)  Result Date: 07/19/2021 CLINICAL DATA:  Left MCA symptoms EXAM: CT ANGIOGRAPHY HEAD AND NECK TECHNIQUE: Multidetector CT imaging of the head and neck was performed using the standard protocol during bolus administration of intravenous contrast. Multiplanar CT image reconstructions and MIPs were obtained to evaluate the vascular anatomy. Carotid stenosis measurements (when applicable) are obtained utilizing NASCET criteria, using the distal internal carotid diameter as the denominator. CONTRAST:  OMNIPAQUE IOHEXOL 350 MG/ML SOLN COMPARISON:  None. FINDINGS: CTA NECK FINDINGS SKELETON: There is no bony spinal canal stenosis. No lytic or blastic lesion. OTHER NECK: Normal pharynx, larynx and major salivary glands. No cervical lymphadenopathy. Unremarkable thyroid gland. UPPER CHEST: No pneumothorax or pleural effusion. No nodules or masses. AORTIC ARCH: There is no calcific atherosclerosis of the aortic arch. There is no aneurysm, dissection or hemodynamically significant stenosis of the visualized portion of the aorta. Conventional 3 vessel aortic branching pattern. The visualized proximal subclavian arteries are widely patent. RIGHT CAROTID SYSTEM: Normal without aneurysm, dissection or stenosis. LEFT CAROTID SYSTEM: The left ICA is occluded at its origin and remains occluded to the carotid terminus. VERTEBRAL ARTERIES: Left dominant configuration. Both origins are clearly patent. There is no dissection, occlusion or flow-limiting stenosis to the skull base (V1-V3 segments). CTA HEAD FINDINGS POSTERIOR CIRCULATION: --Vertebral arteries: Normal V4 segments. --Inferior cerebellar arteries: Normal. --Basilar artery: Normal. --Superior cerebellar arteries: Normal. --Posterior cerebral arteries (PCA): Normal. ANTERIOR CIRCULATION: --Intracranial internal carotid arteries:  Normal. --Anterior cerebral arteries (ACA): Normal. Both A1 segments are present. Patent anterior communicating artery (a-comm). --Middle cerebral arteries (MCA): Right MCA is normal. The left MCA is opacified via collateral flow across the anterior communicating artery. VENOUS SINUSES: As permitted by contrast timing, patent. ANATOMIC VARIANTS: None Review of the MIP images confirms the above findings. IMPRESSION: 1. No intracranial arterial occlusion or high-grade stenosis. 2. 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 The left MCA is opacified via collateral flow across the anterior communicating artery. Aortic Atherosclerosis (ICD10-I70.0). Electronically Signed   By: Deatra Robinson M.D.   On: 07/19/2021 20:21    Labs:  CBC: Recent Labs    07/19/21 1947 07/19/21 1953 07/20/21 0105 07/20/21 0536 07/21/21 0432 07/22/21 0449  WBC 10.4  --   --  10.9* 17.2* 12.0*  HGB 15.8   < > 13.9 14.4 12.0* 12.2*  HCT 48.9   < > 41.0 45.3 36.6* 37.9*  PLT 272  --   --  245 227 211   < > =  values in this interval not displayed.    COAGS: Recent Labs    07/19/21 1947  INR 1.0  APTT 34    BMP: Recent Labs    07/19/21 1947 07/19/21 1953 07/20/21 0105 07/20/21 0536 07/21/21 0432  NA 140 141 142 142 141  K 3.9 3.9 4.6 4.0 3.7  CL 109 111  --  113* 106  CO2 22  --   --  20* 19*  GLUCOSE 77 76  --  180* 121*  BUN 25* 30*  --  27* 27*  CALCIUM 8.8*  --   --  8.1* 8.4*  CREATININE 1.53* 1.50*  --  1.74* 1.70*  GFRNONAA 46*  --   --  40* 41*    LIVER FUNCTION TESTS: Recent Labs    07/19/21 1947  BILITOT 1.1  AST 23  ALT 15  ALKPHOS 54  PROT 6.9  ALBUMIN 3.4*    Assessment and Plan:  Code Stroke S/p  Lt common carotid arteriogram followed by complete revascularization of occluded Lt ICA extracranially and intracranially and  Lt MCA at origin with achieving a TICI 3 revascularization with Dr. Corliss Skains on 8/16 Case complicated by  developing R CFA pseudoaneurysm on 8/18, s/p thrombin injection by Dr. Ardelle Anton   Patient stable. Repeated vascular US showed hematoma, no pseudoaneurysm. Ecchymosis again noted to right groin, appears improved per Dr. Corliss Skains. Critical care considering Heparin rechallenge today.    Further treatment plan per [facility]  Appreciate and agree with the plan.  Please call NIR for questions and concerns.   Electronically Signed: Willette Brace, PA-C 07/22/2021, 9:39 AM   I spent a total of 25 Minutes at the the patient's bedside AND on the patient's hospital floor or unit, greater than 50% of which was counseling/coordinating care for Lt ICA revascularization

## 2021-07-22 NOTE — Progress Notes (Addendum)
NAME:  Roberto Dickson, MRN:  [id number], DOB:  1941-08-30, LOS: 3 ADMISSION DATE:  07/19/2021, CONSULTATION DATE:  07/20/2021 REFERRING MD:  Dr. Terrilee Files , CHIEF COMPLAINT:  Right upper extremities weakness and aphasia  History of Present Illness:  80 y/o white male that presented to the ER from home.  The pt took a nap from 1700hrs to 1830hrs and woke up with right upper extrem weakness and aphasia.  The pt was a stroke alert and was given Tpa.  The pt then went to IR where they found occluded left ICA.  IR was able to open the left ICA.  The patient has a known history of hypertension but does not follow with a PCP.  No other pertinent past medical history is noted at this time.  Pertinent  Medical History  HTN  Significant Hospital Events: Including procedures, antibiotic start and stop dates in addition to other pertinent events   8/16 Suspected left MCA ischemic stroke with L ICA occlusion at origin up to terminus, tPA given and mechanical thrombectomy. Standard ventilator protocol. 8/17 Extubated to 3L [place]. Echo with apical thrombus and evidence of apical hypokinese with EF 50-55%. Started on heparin  8/18 expanding right CFA pseudoaneurysm s/p thrombin injection, heparin gtt held overnight  Interim History / Subjective:  Pseudoaneurysm related bleeding seems stable today. No leg pain per pt, cough, dyspnea. Complains only of right hand numbness limiting his ability to feed himself.   Objective   Blood pressure 138/86, pulse (!) 101, temperature 97.6 F (36.4 C), temperature source Oral, resp. rate (!) 23, weight 90.5 kg, SpO2 92 %.        Intake/Output Summary (Last 24 hours) at 07/22/2021 0934 Last data filed at 07/22/2021 0600 Gross per 24 hour  Intake 1010.85 ml  Output 2745 ml  Net -1734.15 ml   Filed Weights   07/19/21 2000  Weight: 90.5 kg    Examination: General: Elderly male, seated upright in bed trying to eat breakfast.  HENT: NCAT, dry MM. Lungs: rhonchorous.  Normal work of breathing.  Cardiovascular: tachycardic regular rhythm, no murmur or gallops.  Abdomen: Soft non distended, nontender, normal bowel sounds Extremities: No peripheral edema. Movement of all extremities.  Neuro: weak right grip, able to lift right and left arm, wiggle toes.    Labs: S Cr stable Hb stable   No new imaging  Resolved Hospital Problem list     Assessment & Plan:  Hypoxia with new O2 requirement: Atelectasis, component of pulmonary edema/diastolic dysfunction.  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 Apical, apical anterior, and apical septal akinesis. Inferoseptal hypokinesis. EF 50 to 55%. -cardiology following -discussed with Stroke team, will restart heparin, start warfarin bridge today -monitor L CFA hematoma   HTN: At home on ACEi, HCTZ. Per neurology, SBP goal 120-140 s/p thrombectomy.  -Monitor BP  AKI versus CKD Cr peaked at 1.74, unclear Cr baseline due to minimal outpatient follow up.  -Trend renal function -Strict I/Os  Leukocytosis: No obvious signs or symptoms of infection. Afebrile. -  Trend CBC  Best Practice (right click and "Reselect all SmartList Selections" daily)   Diet/type: clear liquids thin liquids, regular DVT prophylaxis: systemic heparin GI prophylaxis: PPI Lines: yes and it is still needed Two left peripheral lines Foley:  Yes, and it is still needed - will reassess after ok'd to move around  Code Status:  full code Last date of  multidisciplinary goals of care discussion: wife coming in later today Disposition: Can transfer out of ICU later today if pseudoaneurysm/hematoma stable   Review of Systems:   Negative for cough, chest pain, dyspnea, leg pain.      Laroy Apple Pulmonary/Critical Care

## 2021-07-22 NOTE — Evaluation (Signed)
Occupational Therapy Evaluation Patient Details Name: Roberto Dickson MRN: [id number] DOB: [birth year] Today's Date: 07/22/2021    History of Present Illness Pt is a 80 y.o. male who presented 07/19/21 with L gaze deviation, R facial droop, R weakness, and aphasia. tPA was administered. 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 S/p revascularization of L ICA 8/16. ETT 8/16-8/17. PMH: HTN   Clinical Impression   PT admitted with L ICA with revascularization. Pt currently with functional limitiations due to the deficits listed below (see OT problem list). Pt with R UE / LE deficits. Pt requires mod+2 (A) for basic transfer. Pt tracking in all visual fields but no glasses present in room.  Pt will benefit from skilled OT to increase their independence and safety with adls and balance to allow discharge CIR.     Follow Up Recommendations  CIR    Equipment Recommendations  3 in 1 bedside commode    Recommendations for Other Services Rehab consult     Precautions / Restrictions Precautions Precautions: Fall Precaution Comments: watch BP      Mobility Bed Mobility Overal bed mobility: Needs Assistance Bed Mobility: Rolling;Supine to Sit Rolling: +2 for physical assistance;Mod assist   Supine to sit: +2 for physical assistance;Mod assist     General bed mobility comments: pt able to progress bil LE toward EOB with (A) to place off  bed. pt reaching with R UE toward bed rail with decreased shoulder flexion. pt pushing up with L UE to come to static sitting. pt static sitting with min to min guard (A)    Transfers Overall transfer level: Needs assistance Equipment used: 2 person hand held assist Transfers: Sit to/from [employer] Sit to Stand: +2 physical assistance;Min assist;From elevated surface Stand pivot transfers: +2 physical assistance;Mod assist       General transfer comment: no RW in room to assess R UE on RW. pt with decreased grasp so at this time would  need hand over hand to sustain grasp. pt stepping to chair on R side    Balance Overall balance assessment: Needs assistance Sitting-balance support: Single extremity supported;Feet supported Sitting balance-Leahy Scale: Fair     Standing balance support: Bilateral upper extremity supported;During functional activity Standing balance-Leahy Scale: Poor                             ADL either performed or assessed with clinical judgement   ADL Overall ADL's : Needs assistance/impaired Eating/Feeding: Maximal assistance Eating/Feeding Details (indicate cue type and reason): pt will need AE to help with self feeding but he has elbow flexion Grooming: Maximal assistance Grooming Details (indicate cue type and reason): hand over hadn to wash face                 Toilet Transfer: +2 for physical assistance;Moderate assistance;Stand-pivot Toilet Transfer Details (indicate cue type and reason): simulated eob to chair           General ADL Comments: pt able to progress to chair sitting. pt without chair alarm as unit no alarms at this time. Secretary working on obtaining. Wife and RN made aware in addition to patient fall risk . pt reading spanish sign for calling for RN and not falling to use the call bell.     Vision Baseline Vision/History: Wears glasses Wears Glasses: Reading only Patient Visual Report: No change from baseline Additional Comments: pt denies any changes, pt correctly idenifies colors and  reading fall sign in spanish 100% correct no errors     Perception     Praxis      Pertinent Vitals/Pain Pain Assessment: No/denies pain     Hand Dominance Right   Extremity/Trunk Assessment Upper Extremity Assessment Upper Extremity Assessment: RUE deficits/detail RUE Deficits / Details: activation of shoulder flexion to 80 degrees with inability to sustain, activation elbow flexion / extension/ wrist flexion/ extension. pt with digit movement but unable  to make a grasp. pt able to use flexion /extension of wrist to complete opening / closing of hand. pt can not sustain grasp on pen. pt likes to complete paint by numbers and concerned he can not continue this task   Lower Extremity Assessment Lower Extremity Assessment: Defer to PT evaluation   Cervical / Trunk Assessment Cervical / Trunk Assessment: Normal   Communication Communication Communication: Prefers language other than English (spanish)   Cognition Arousal/Alertness: Awake/alert Behavior During Therapy: Flat affect Overall Cognitive Status:  (to be further assessed for higher executive function. delayed responses to some questions)                                 General Comments: pt answering questions with delay at times. pt able to follow 2 step commands. pt pleasant and laughing appropriately to joke as pt answered in english and not spanish. wife explains that patient understands english but can not speak it well. Pt and wife at times giving greetings in [place]. Pt delays responses at times so will need further testing but for this session [facility] for what was assessed   General Comments  VSS with SBP <140    Exercises     Shoulder Instructions      Home Living Family/patient expects to be discharged to:: Private residence Living Arrangements: Spouse/significant other Available Help at Discharge: Family;Available 24 hours/day Type of Home: Apartment Home Access: Elevator     Home Layout: One level     Bathroom Shower/Tub: Producer, television/film/video: Handicapped height     Home Equipment: Grab bars - tub/shower          Prior Functioning/Environment Level of Independence: Independent        Comments: pt can understand some english but speaks spanish. pt asked color of item and states "red"        OT Problem List: Decreased strength;Decreased range of motion;Decreased activity tolerance;Impaired balance (sitting and/or  standing);Decreased cognition;Decreased safety awareness;Decreased knowledge of precautions;Cardiopulmonary status limiting activity;Impaired UE functional use      OT Treatment/Interventions: Self-care/ADL training;Therapeutic exercise;Energy conservation;DME and/or AE instruction;Manual therapy;Modalities;Therapeutic activities;Patient/family education;Balance training;Cognitive remediation/compensation;Neuromuscular education    OT Goals(Current goals can be found in the care plan section) Acute Rehab OT Goals Patient Stated Goal: to get therapy and return to paint by numbers OT Goal Formulation: With patient/family Time For Goal Achievement: 08/05/21 Potential to Achieve Goals: Good  OT Frequency: Min 3X/week   Barriers to D/C:            Co-evaluation PT/OT/SLP Co-Evaluation/Treatment: Yes Reason for Co-Treatment: For patient/therapist safety;To address functional/ADL transfers   OT goals addressed during session: Proper use of Adaptive equipment and DME;ADL's and self-care;Strengthening/ROM      AM-PAC OT "6 Clicks" Daily Activity     Outcome Measure Help from another person eating meals?: A Lot Help from another person taking care of personal grooming?: A Lot Help from another person toileting, which includes using  toliet, bedpan, or urinal?: A Lot Help from another person bathing (including washing, rinsing, drying)?: A Lot Help from another person to put on and taking off regular upper body clothing?: A Lot Help from another person to put on and taking off regular lower body clothing?: A Lot 6 Click Score: 12   End of Session Equipment Utilized During Treatment: Gait belt;Oxygen Nurse Communication: Mobility status;Precautions  Activity Tolerance: Patient tolerated treatment well Patient left: in chair;with call bell/phone within reach;with family/visitor present  OT Visit Diagnosis: Unsteadiness on feet (R26.81);Muscle weakness (generalized) (M62.81)                 Time: 5035-4656 OT Time Calculation (min): 22 min Charges:  OT General Charges $OT Visit: 1 Visit OT Evaluation $OT Eval Moderate Complexity: 1 Mod   Brynn, OTR/L  Acute Rehabilitation Services Pager: [phone] Office: [phone] .   Mateo Flow 07/22/2021, 2:14 PM

## 2021-07-22 NOTE — Discharge Instructions (Signed)
Information on my medicine - Coumadin   (Warfarin)  This medication education was reviewed with me or my healthcare representative as part of my discharge preparation.  The pharmacist that spoke with me during my hospital stay was:  Katheran James, RPH  Why was Coumadin prescribed for you? Coumadin was prescribed for you because you have a blood clot or a medical condition that can cause an increased risk of forming blood clots. Blood clots can cause serious health problems by blocking the flow of blood to the heart, lung, or brain. Coumadin can prevent harmful blood clots from forming. As a reminder your indication for Coumadin is:  apical thrombus  What test will check on my response to Coumadin? While on Coumadin (warfarin) you will need to have an INR test regularly to ensure that your dose is keeping you in the desired range. The INR (international normalized ratio) number is calculated from the result of the laboratory test called prothrombin time (PT).  If an INR APPOINTMENT HAS NOT ALREADY BEEN MADE FOR YOU please schedule an appointment to have this lab work done by your health care provider within 7 days. Your INR goal is usually a number between:  2 to 3 or your provider may give you a more narrow range like 2-2.5.  Ask your health care provider during an office visit what your goal INR is.  What  do you need to  know  About  COUMADIN? Take Coumadin (warfarin) exactly as prescribed by your healthcare provider about the same time each day.  DO NOT stop taking without talking to the doctor who prescribed the medication.  Stopping without other blood clot prevention medication to take the place of Coumadin may increase your risk of developing a new clot or stroke.  Get refills before you run out.  What do you do if you miss a dose? If you miss a dose, take it as soon as you remember on the same day then continue your regularly scheduled regimen the next day.  Do not take two doses of  Coumadin at the same time.  Important Safety Information A possible side effect of Coumadin (Warfarin) is an increased risk of bleeding. You should call your healthcare provider right away if you experience any of the following: Bleeding from an injury or your nose that does not stop. Unusual colored urine (red or dark brown) or unusual colored stools (red or black). Unusual bruising for unknown reasons. A serious fall or if you hit your head (even if there is no bleeding).  Some foods or medicines interact with Coumadin (warfarin) and might alter your response to warfarin. To help avoid this: Eat a balanced diet, maintaining a consistent amount of Vitamin K. Notify your provider about major diet changes you plan to make. Avoid alcohol or limit your intake to 1 drink for women and 2 drinks for men per day. (1 drink is 5 oz. wine, 12 oz. beer, or 1.5 oz. liquor.)  Make sure that ANY health care provider who prescribes medication for you knows that you are taking Coumadin (warfarin).  Also make sure the healthcare provider who is monitoring your Coumadin knows when you have started a new medication including herbals and non-prescription products.  Coumadin (Warfarin)  Major Drug Interactions  Increased Warfarin Effect Decreased Warfarin Effect  Alcohol (large quantities) Antibiotics (esp. Septra/Bactrim, Flagyl, Cipro) Amiodarone (Cordarone) Aspirin (ASA) Cimetidine (Tagamet) Megestrol (Megace) NSAIDs (ibuprofen, naproxen, etc.) Piroxicam (Feldene) Propafenone (Rythmol SR) Propranolol (Inderal) Isoniazid (INH)  Posaconazole (Noxafil) Barbiturates (Phenobarbital) Carbamazepine (Tegretol) Chlordiazepoxide (Librium) Cholestyramine (Questran) Griseofulvin Oral Contraceptives Rifampin Sucralfate (Carafate) Vitamin K   Coumadin (Warfarin) Major Herbal Interactions  Increased Warfarin Effect Decreased Warfarin Effect  Garlic Ginseng Ginkgo biloba Coenzyme Q10 Green tea St.  John's wort    Coumadin (Warfarin) FOOD Interactions  Eat a consistent number of servings per week of foods HIGH in Vitamin K (1 serving =  cup)  Collards (cooked, or boiled & drained) Kale (cooked, or boiled & drained) Mustard greens (cooked, or boiled & drained) Parsley *serving size only =  cup Spinach (cooked, or boiled & drained) Swiss chard (cooked, or boiled & drained) Turnip greens (cooked, or boiled & drained)  Eat a consistent number of servings per week of foods MEDIUM-HIGH in Vitamin K (1 serving = 1 cup)  Asparagus (cooked, or boiled & drained) Broccoli (cooked, boiled & drained, or raw & chopped) Brussel sprouts (cooked, or boiled & drained) *serving size only =  cup Lettuce, raw (green leaf, endive, romaine) Spinach, raw Turnip greens, raw & chopped   These websites have more information on Coumadin (warfarin):  http://www.king-russell.com/; https://www.hines.net/;

## 2021-07-23 DIAGNOSIS — I513 Intracardiac thrombosis, not elsewhere classified: Secondary | ICD-10-CM | POA: Diagnosis not present

## 2021-07-23 DIAGNOSIS — N390 Urinary tract infection, site not specified: Secondary | ICD-10-CM

## 2021-07-23 DIAGNOSIS — I63512 Cerebral infarction due to unspecified occlusion or stenosis of left middle cerebral artery: Secondary | ICD-10-CM | POA: Diagnosis not present

## 2021-07-23 DIAGNOSIS — D72829 Elevated white blood cell count, unspecified: Secondary | ICD-10-CM

## 2021-07-23 DIAGNOSIS — I6522 Occlusion and stenosis of left carotid artery: Secondary | ICD-10-CM | POA: Diagnosis not present

## 2021-07-23 LAB — URINALYSIS, COMPLETE (UACMP) WITH MICROSCOPIC
Bilirubin Urine: NEGATIVE
Glucose, UA: NEGATIVE mg/dL
Ketones, ur: NEGATIVE mg/dL
Nitrite: NEGATIVE
Protein, ur: NEGATIVE mg/dL
Specific Gravity, Urine: 1.017 (ref 1.005–1.030)
pH: 6 (ref 5.0–8.0)

## 2021-07-23 LAB — PROTIME-INR
INR: 1.1 (ref 0.8–1.2)
Prothrombin Time: 14.2 seconds (ref 11.4–15.2)

## 2021-07-23 LAB — BASIC METABOLIC PANEL
Anion gap: 10 (ref 5–15)
BUN: 23 mg/dL (ref 8–23)
CO2: 21 mmol/L — ABNORMAL LOW (ref 22–32)
Calcium: 8.1 mg/dL — ABNORMAL LOW (ref 8.9–10.3)
Chloride: 105 mmol/L (ref 98–111)
Creatinine, Ser: 1.38 mg/dL — ABNORMAL HIGH (ref 0.61–1.24)
GFR, Estimated: 52 mL/min — ABNORMAL LOW (ref >60)
Glucose, Bld: 115 mg/dL — ABNORMAL HIGH (ref 70–99)
Potassium: 3.7 mmol/L (ref 3.5–5.1)
Sodium: 136 mmol/L (ref 135–145)

## 2021-07-23 LAB — CBC
HCT: 34.5 % — ABNORMAL LOW (ref 39.0–52.0)
Hemoglobin: 11.7 g/dL — ABNORMAL LOW (ref 13.0–17.0)
MCH: 31.7 pg (ref 26.0–34.0)
MCHC: 33.9 g/dL (ref 30.0–36.0)
MCV: 93.5 fL (ref 80.0–100.0)
Platelets: 194 10*3/uL (ref 150–400)
RBC: 3.69 MIL/uL — ABNORMAL LOW (ref 4.22–5.81)
RDW: 14.3 % (ref 11.5–15.5)
WBC: 12.8 10*3/uL — ABNORMAL HIGH (ref 4.0–10.5)
nRBC: 0 % (ref 0.0–0.2)

## 2021-07-23 LAB — RAPID URINE DRUG SCREEN, HOSP PERFORMED
Amphetamines: NOT DETECTED
Barbiturates: NOT DETECTED
Benzodiazepines: NOT DETECTED
Cocaine: NOT DETECTED
Opiates: NOT DETECTED
Tetrahydrocannabinol: NOT DETECTED

## 2021-07-23 LAB — HEPARIN LEVEL (UNFRACTIONATED)
Heparin Unfractionated: 0.28 IU/mL — ABNORMAL LOW (ref 0.30–0.70)
Heparin Unfractionated: 0.27 IU/mL — ABNORMAL LOW (ref 0.30–0.70)
Heparin Unfractionated: 0.33 IU/mL (ref 0.30–0.70)

## 2021-07-23 MED ORDER — WARFARIN SODIUM 5 MG PO TABS
5.0000 mg | ORAL_TABLET | Freq: Once | ORAL | Status: AC
  Administered 2021-07-23: 5 mg via ORAL
  Filled 2021-07-23 (×2): qty 1

## 2021-07-23 MED ORDER — CEFTRIAXONE SODIUM 1 G IJ SOLR
1.0000 g | INTRAMUSCULAR | Status: DC
  Administered 2021-07-23 – 2021-07-26 (×4): 1 g via INTRAVENOUS
  Filled 2021-07-23 (×4): qty 10

## 2021-07-23 NOTE — Progress Notes (Signed)
ANTICOAGULATION CONSULT NOTE   Pharmacy Consult for heparin + warfarin Indication:  Apical thrombus (recent stroke)  No Known Allergies  Patient Measurements: Weight: 90.5 kg (199 lb 8.3 oz) Heparin Dosing Weight:   Vital Signs: Temp: 98.2 F (36.8 C) (08/20 1200) Temp Source: Oral (08/20 1200) BP: 142/74 (08/20 1200) Pulse Rate: 95 (08/20 1200)  Labs: Recent Labs    07/21/21 0432 07/21/21 1158 07/22/21 0449 07/22/21 1835 07/23/21 0225 07/23/21 1137  HGB 12.0*  --  12.2*  --  11.7*  --   HCT 36.6*  --  37.9*  --  34.5*  --   PLT 227  --  211  --  194  --   LABPROT  --   --   --   --  14.2  --   INR  --   --   --   --  1.1  --   HEPARINUNFRC 0.50   < >  --  <0.10* 0.28* 0.27*  CREATININE 1.70*  --   --   --  1.38*  --    < > = values in this interval not displayed.     CrCl cannot be calculated (Unknown ideal weight.).   Medical History: Past Medical History:  Diagnosis Date   Hypertension     Medications:  Medications Prior to Admission  Medication Sig Dispense Refill Last Dose   enalapril (VASOTEC) 20 MG tablet Take 20 mg by mouth daily.   07/19/2021   hydrochlorothiazide (HYDRODIURIL) 12.5 MG tablet Take 12.5 mg by mouth daily.   07/19/2021   ibuprofen (ADVIL) 200 MG tablet Take 200 mg by mouth every 6 (six) hours as needed for mild pain.   07/17/2021    Assessment: 81 YOM who presented with an ischemic stroke s/p alteplase at 2000 on 8/16. 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 Pharmacy consulted to start IV heparin per stroke protocol. 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 Heparin drip turned off overnight for increased bruising/possible bleeding from groin area. Per discussion with stroke team Pearlean Brownie) this AM, issues seem to be resolved and ok to resume heparin now with warfarin bridge. Patient was not on anticoagulation PTA.  Heparin level slightly subtherapeutic at 0.27 s/p rate increase to 1350 units/hr.   INR subtherapeutic at 1.1 s/p first warfarin dose  Goal of Therapy:  INR 2-3 Heparin level 0.3-0.5 units/ml Monitor platelets by anticoagulation protocol: Yes   Plan:  Increase heparin gtt to 1450 units/hr F/u 8 hour heparin level Warfarin 5mg  PO x 1 today Daily INR, CBC, s/s bleeding  , PharmD Clinical Pharmacist ED Pharmacist Phone # [phone] 07/23/2021 12:56 PM

## 2021-07-23 NOTE — Progress Notes (Signed)
ANTICOAGULATION CONSULT NOTE   Pharmacy Consult for heparin + warfarin Indication:  Apical thrombus (recent stroke)  No Known Allergies  Patient Measurements: Weight: 90.5 kg (199 lb 8.3 oz) Heparin Dosing Weight:   Vital Signs: Temp: 98.3 F (36.8 C) (08/20 2100) Temp Source: Oral (08/20 2100) BP: 123/68 (08/20 2100) Pulse Rate: 100 (08/20 2100)  Labs: Recent Labs    07/21/21 0432 07/21/21 1158 07/22/21 0449 07/22/21 1835 07/23/21 0225 07/23/21 1137 07/23/21 2051  HGB 12.0*  --  12.2*  --  11.7*  --   --   HCT 36.6*  --  37.9*  --  34.5*  --   --   PLT 227  --  211  --  194  --   --   LABPROT  --   --   --   --  14.2  --   --   INR  --   --   --   --  1.1  --   --   HEPARINUNFRC 0.50   < >  --    < > 0.28* 0.27* 0.33  CREATININE 1.70*  --   --   --  1.38*  --   --    < > = values in this interval not displayed.     CrCl cannot be calculated (Unknown ideal weight.).   Medical History: Past Medical History:  Diagnosis Date   Hypertension     Medications:  Medications Prior to Admission  Medication Sig Dispense Refill Last Dose   enalapril (VASOTEC) 20 MG tablet Take 20 mg by mouth daily.   07/19/2021   hydrochlorothiazide (HYDRODIURIL) 12.5 MG tablet Take 12.5 mg by mouth daily.   07/19/2021   ibuprofen (ADVIL) 200 MG tablet Take 200 mg by mouth every 6 (six) hours as needed for mild pain.   07/17/2021    Assessment: 89 YOM who presented with an ischemic stroke s/p alteplase at 2000 on 8/16. 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 Pharmacy consulted to start IV heparin per stroke protocol. 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 Heparin drip turned off overnight for increased bruising/possible bleeding from groin area. Per discussion with stroke team Pearlean Brownie) this AM, issues seem to be resolved and ok to resume heparin now with warfarin bridge. Patient was not on anticoagulation PTA.  Heparin level is now therapeutic at  0.33 on 1450 units/hr of IV heparin. No s/s of overt bleeding noted per RN   Goal of Therapy:  INR 2-3 Heparin level 0.3-0.5 units/ml Monitor platelets by anticoagulation protocol: Yes   Plan:  Continue heparin gtt at 1450 units/hr F/u AM HL  Daily INR, CBC, s/s bleeding  Vinnie Level, PharmD., BCPS, BCCCP Clinical Pharmacist Please refer to [facility] for unit-specific pharmacist

## 2021-07-23 NOTE — Progress Notes (Signed)
ANTICOAGULATION CONSULT NOTE   Pharmacy Consult for heparin Indication:  Apical thrombus (recent stroke)  No Known Allergies  Patient Measurements: Weight: 90.5 kg (199 lb 8.3 oz) Heparin Dosing Weight:   Vital Signs: Temp: 98.4 F (36.9 C) (08/20 0000) Temp Source: Oral (08/20 0000) BP: 134/86 (08/20 0200) Pulse Rate: 96 (08/20 0200)  Labs: Recent Labs    07/20/21 0536 07/21/21 0432 07/21/21 1158 07/21/21 2317 07/22/21 0449 07/22/21 1835 07/23/21 0225  HGB 14.4 12.0*  --   --  12.2*  --  11.7*  HCT 45.3 36.6*  --   --  37.9*  --  34.5*  PLT 245 227  --   --  211  --  194  LABPROT  --   --   --   --   --   --  14.2  INR  --   --   --   --   --   --  1.1  HEPARINUNFRC  --  0.50   < > <0.10*  --  <0.10* 0.28*  CREATININE 1.74* 1.70*  --   --   --   --  1.38*   < > = values in this interval not displayed.     CrCl cannot be calculated (Unknown ideal weight.).   Medical History: Past Medical History:  Diagnosis Date   Hypertension     Medications:  Medications Prior to Admission  Medication Sig Dispense Refill Last Dose   enalapril (VASOTEC) 20 MG tablet Take 20 mg by mouth daily.   07/19/2021   hydrochlorothiazide (HYDRODIURIL) 12.5 MG tablet Take 12.5 mg by mouth daily.   07/19/2021   ibuprofen (ADVIL) 200 MG tablet Take 200 mg by mouth every 6 (six) hours as needed for mild pain.   07/17/2021    Assessment: 59 YOM who presented with an ischemic stroke s/p alteplase at 2000 on 8/16. 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 Pharmacy consulted to start IV heparin per stroke protocol. 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 Heparin drip turned off overnight for increased bruising/possible bleeding from groin area. Per discussion with stroke team Pearlean Brownie) this AM, issues seem to be resolved and ok to resume heparin now with warfarin bridge. Patient was not on anticoagulation PTA.  Heparin gtt stopped 8/19 1730 PM for bleeding  from IV site.  Heparin level drawn 1835 PM undetectable as expected. Resumed 8/19 2030 as bleeding resolved.  Heparin level slightly subtherapeutic (0.28) on gtt at 1300 units/hr. No issues with line or bleeding reported per RN.  Goal of Therapy:  Heparin level 0.3-0.5 units/ml Monitor platelets by anticoagulation protocol: Yes   Plan:  Increase IV heparin to 1350 units/hr Check heparin level in 8 hrs  Christoper Fabian, PharmD, BCPS Please see amion for complete clinical pharmacist phone list  07/23/2021 3:39 AM

## 2021-07-23 NOTE — Plan of Care (Signed)
Patient is alert and oriented x4, NIH of 0 but right side is weaker than left, pupils are equal and brisk, speech is clear. Vitals stable and maintained within order parameters. Adequate urine output via foley. Bed is locked, in lowest position. Will continue to monitor.   Problem: Education: Goal: Knowledge of General Education information will improve Description: Including pain rating scale, medication(s)/side effects and non-pharmacologic comfort measures Outcome: Progressing   Problem: Health Behavior/Discharge Planning: Goal: Ability to manage health-related needs will improve Outcome: Progressing   Problem: Clinical Measurements: Goal: Ability to maintain clinical measurements within normal limits will improve Outcome: Progressing Goal: Will remain free from infection Outcome: Progressing Goal: Diagnostic test results will improve Outcome: Progressing Goal: Respiratory complications will improve Outcome: Progressing Goal: Cardiovascular complication will be avoided Outcome: Progressing   Problem: Activity: Goal: Risk for activity intolerance will decrease Outcome: Progressing   Problem: Nutrition: Goal: Adequate nutrition will be maintained Outcome: Progressing   Problem: Coping: Goal: Level of anxiety will decrease Outcome: Progressing   Problem: Elimination: Goal: Will not experience complications related to bowel motility Outcome: Progressing Goal: Will not experience complications related to urinary retention Outcome: Progressing   Problem: Pain Managment: Goal: General experience of comfort will improve Outcome: Progressing   Problem: Safety: Goal: Ability to remain free from injury will improve Outcome: Progressing   Problem: Skin Integrity: Goal: Risk for impaired skin integrity will decrease Outcome: Progressing

## 2021-07-23 NOTE — Progress Notes (Signed)
STROKE TEAM PROGRESS NOTE   Interval History  No family at the bedside. Pt lying in bed, no acute event overnight. Still complaining of b/l medial thigh pain with extensive bruises, but stable in size without extension. On heparin IV, tolerating well. Also on coumadin with INR 1.1 today. Still has leukocytosis, and previous UA showed UTI, will repeat UA. AKI improved, encourage po intake.   Pertinent Lab Work and Imaging    07/19/21 CT Head WO IV Contrast 1. No acute intracranial abnormality. 2. 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 3. ASPECTS is 10.  07/19/21 CT Angio Head and Neck W WO IV Contrast 1. No intracranial arterial occlusion or high-grade stenosis. 2. 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 The left MCA is opacified via collateral flow across the anterior communicating artery.  MRI Brain WO IV Contrast Scattered acute infarctions within the left cerebellum and both cerebral hemispheres, consistent with cardiac or ascending aortic source. No large confluent acute infarction. 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  Left ventricular ejection fraction slightly diminished at 50 to 55%.  Physical Examination   Temp:  [97.7 F (36.5 C)-99.2 F (37.3 C)] 98.2 F (36.8 C) (08/20 1200) Pulse Rate:  [76-116] 95 (08/20 1200) Resp:  [18-30] 25 (08/20 1200) BP: (113-153)/(61-108) 142/74 (08/20 1200) SpO2:  [89 %-98 %] 94 % (08/20 1200)  General - Well nourished, well developed, in no apparent distress.  Ophthalmologic - fundi not visualized due to noncooperation.  Cardiovascular - Regular rhythm and rate.  Mental Status -  Level of arousal and orientation to time,  place, and person were intact. Language including expression and comprehension was assessed and found intact. Due to language barrier, 2/3 naming, and only repetition with simple 3 word sentences. Mild dysarthria  Cranial Nerves II - XII - II - Visual field intact OU. III, IV, VI - Extraocular movements intact. V - Facial sensation intact bilaterally. VII - right mild facial droop VIII - Hearing & vestibular intact bilaterally. X - Palate elevates symmetrically. Mild dysarthria XI - Chin turning & shoulder shrug intact bilaterally. XII - Tongue protrusion intact.  Motor Strength - The patient's strength was normal in left UE and LE but RUE pronator drift present with RUE proximal 4+/5 and distal finger grip 1/5. RLE proximal 5-/5 and distal ankle DF/PF 4/5.  Bulk was normal and fasciculations were absent.   Motor Tone - Muscle tone was assessed at the neck and appendages and was normal.  Reflexes - The patient's reflexes were symmetrical in all extremities and he had no pathological reflexes.  Sensory - Light touch, temperature/pinprick were assessed and were symmetrical.    Coordination - The patient had normal movements in the hands with no ataxia or dysmetria.  Tremor was absent.  Gait and Station - deferred.    Assessment and Plan   [name] is a 80 y.o. male w/pmh of HTN who presents with L MCA syndrome, received IVTPA and underwent mechanical thrombectomy.   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 CTA Head and Neck showed LICA occlusion, no significant atherosclerosis. MRI Brain showed  left cerebellum, right MCA/PCA, MCA/ACA, left MCA/PCA, MCA and ACA scattered infarcts. Echocardiogram EF 50 to 55% with LV thrombus. LDL 132 and A1C 5.9.  UDS negative -On heparin IV bridged to Coumadin, INR today 1.1 -Atorvastatin 40 mg for stroke prevention   -PT/OT/ST  -At discharge will place ambulatory referral to neurology for stroke follow up   CARDS #LV thrombus TTE showed EF 50 to 55% with LV thrombus Cardiology on board On heparin IV bridged to Coumadin On metoprolol 25  #Hypertension He has a history of HTN, does not appear to be taking any medications at home.  - Long term blood pressure goal normotensive - On metoprolol 25  #Rt groin pseudoaneurysm postprocedure  -Vascular surgery consulted -s/p thrombin injection 07/21/21  -Repeat ultrasound showed pseudoaneurysm resolution  #Hyperlipidemia From a stroke prevention stand point, the LDL goal is < 70. LDL is 132, Atorvastatin 40 mg started this admission.  Continue on discharge  RENAL #AKI versus CKD  Cr elevated at 1.74-1.70-1.38, unclear what baseline Cr is.   Continue to trend, encourage PO fluids  ENDO #Prediabetes  Hemoglobin A1C this admission noted to be 5.9, in the prediabetic range. Will manage hyperglycemia with SSI if necessary   HEME #DVT Prophylaxis  Hemoglobin hematocrit and platelet count stable.  On heparin IV bridging to Coumadin  ID #Leukocytosis  #Spiking fever Afebrile. No infectious processes at this time. He has a mild leukocytosis WBC 17.2-12.0-12.8 T-max 100.8 UA 8/18 WBC >50, 8/20 UA 21-50 Will check urine culture Start Rocephin  Hospital day # 4  This patient is critically ill due to embolic stroke status post tPA and thrombectomy, LV thrombus, leukocytosis, AKI on CKD and at significant risk of neurological worsening, death form heart failure, recurrent stroke, hemorrhagic conversion, seizure, sepsis, renal failure. This patient's care requires constant monitoring of vital signs, hemodynamics, respiratory and cardiac monitoring, review of multiple databases, neurological assessment, discussion with family, other specialists and medical decision making of high complexity. I spent 40 minutes of neurocritical care time in the care of this  patient.  Marvel Plan, MD PhD Stroke Neurology 07/23/2021 3:22 PM   To contact Stroke Continuity provider, please refer to [email or web address]. After hours, contact General Neurology

## 2021-07-24 DIAGNOSIS — I6522 Occlusion and stenosis of left carotid artery: Secondary | ICD-10-CM | POA: Diagnosis not present

## 2021-07-24 DIAGNOSIS — L8990 Pressure ulcer of unspecified site, unspecified stage: Secondary | ICD-10-CM | POA: Insufficient documentation

## 2021-07-24 DIAGNOSIS — I63512 Cerebral infarction due to unspecified occlusion or stenosis of left middle cerebral artery: Secondary | ICD-10-CM | POA: Diagnosis not present

## 2021-07-24 DIAGNOSIS — I513 Intracardiac thrombosis, not elsewhere classified: Secondary | ICD-10-CM | POA: Diagnosis not present

## 2021-07-24 LAB — HEPARIN LEVEL (UNFRACTIONATED)
Heparin Unfractionated: 0.27 IU/mL — ABNORMAL LOW (ref 0.30–0.70)
Heparin Unfractionated: 0.45 IU/mL (ref 0.30–0.70)

## 2021-07-24 LAB — BASIC METABOLIC PANEL
Anion gap: 7 (ref 5–15)
BUN: 23 mg/dL (ref 8–23)
CO2: 22 mmol/L (ref 22–32)
Calcium: 7.7 mg/dL — ABNORMAL LOW (ref 8.9–10.3)
Chloride: 105 mmol/L (ref 98–111)
Creatinine, Ser: 1.37 mg/dL — ABNORMAL HIGH (ref 0.61–1.24)
GFR, Estimated: 53 mL/min — ABNORMAL LOW (ref >60)
Glucose, Bld: 110 mg/dL — ABNORMAL HIGH (ref 70–99)
Potassium: 3.7 mmol/L (ref 3.5–5.1)
Sodium: 134 mmol/L — ABNORMAL LOW (ref 135–145)

## 2021-07-24 LAB — PROTIME-INR
INR: 1.2 (ref 0.8–1.2)
Prothrombin Time: 15.6 seconds — ABNORMAL HIGH (ref 11.4–15.2)

## 2021-07-24 LAB — CBC
HCT: 32.9 % — ABNORMAL LOW (ref 39.0–52.0)
Hemoglobin: 11 g/dL — ABNORMAL LOW (ref 13.0–17.0)
MCH: 31.4 pg (ref 26.0–34.0)
MCHC: 33.4 g/dL (ref 30.0–36.0)
MCV: 94 fL (ref 80.0–100.0)
Platelets: 199 10*3/uL (ref 150–400)
RBC: 3.5 MIL/uL — ABNORMAL LOW (ref 4.22–5.81)
RDW: 14.1 % (ref 11.5–15.5)
WBC: 14.7 10*3/uL — ABNORMAL HIGH (ref 4.0–10.5)
nRBC: 0 % (ref 0.0–0.2)

## 2021-07-24 MED ORDER — WARFARIN SODIUM 5 MG PO TABS
5.0000 mg | ORAL_TABLET | Freq: Once | ORAL | Status: AC
  Administered 2021-07-24: 5 mg via ORAL
  Filled 2021-07-24: qty 1

## 2021-07-24 MED ORDER — ENOXAPARIN SODIUM 100 MG/ML IJ SOSY
1.0000 mg/kg | PREFILLED_SYRINGE | Freq: Two times a day (BID) | INTRAMUSCULAR | Status: DC
  Administered 2021-07-24 – 2021-07-26 (×5): 90 mg via SUBCUTANEOUS
  Filled 2021-07-24 (×6): qty 1

## 2021-07-24 NOTE — Progress Notes (Signed)
ANTICOAGULATION CONSULT NOTE   Pharmacy Consult for heparin and warfarin Indication:  Apical thrombus (recent stroke)  Labs: Recent Labs    07/22/21 0449 07/22/21 1835 07/23/21 0225 07/23/21 1137 07/23/21 2051 07/24/21 0238 07/24/21 1157  HGB 12.2*  --  11.7*  --   --  11.0*  --   HCT 37.9*  --  34.5*  --   --  32.9*  --   PLT 211  --  194  --   --  199  --   LABPROT  --   --  14.2  --   --  15.6*  --   INR  --   --  1.1  --   --  1.2  --   HEPARINUNFRC  --    < > 0.28*   < > 0.33 0.27* 0.45  CREATININE  --   --  1.38*  --   --  1.37*  --    < > = values in this interval not displayed.   Assessment: 15 YOM who presented with an ischemic stroke s/p alteplase at 2000 on 8/16. 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 Pharmacy consulted to start IV heparin per stroke protocol. 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 Heparin drip turned off overnight for increased bruising/possible bleeding from groin area. Per discussion with stroke team Pearlean Brownie) this AM, issues seem to be resolved and ok to resume heparin now with warfarin bridge. Patient was not on anticoagulation PTA.  Heparin level is therapeutic 0.45 on 1550 units/hr of IV heparin. INR is 1.2.  Goal of Therapy:  INR 2-3 Heparin level 0.3-0.5 units/ml Monitor platelets by anticoagulation protocol: Yes   Plan:  Continue heparin gtt to 1550 units/hr Give 5 mg warfarin F/u 2000 HL  Daily INR, CBC, s/s bleeding   March Rummage, PharmD PGY1 Pharmacy Resident  Please check AMION for all [facility] pharmacy phone numbers After 10:00 PM call main pharmacy [phone]

## 2021-07-24 NOTE — Progress Notes (Signed)
ANTICOAGULATION CONSULT NOTE   Pharmacy Consult for heparin Indication:  Apical thrombus (recent stroke)  Labs: Recent Labs    07/21/21 0432 07/21/21 1158 07/22/21 0449 07/22/21 1835 07/23/21 0225 07/23/21 1137 07/23/21 2051 07/24/21 0238  HGB 12.0*  --  12.2*  --  11.7*  --   --  11.0*  HCT 36.6*  --  37.9*  --  34.5*  --   --  32.9*  PLT 227  --  211  --  194  --   --  199  LABPROT  --   --   --   --  14.2  --   --  15.6*  INR  --   --   --   --  1.1  --   --  1.2  HEPARINUNFRC 0.50   < >  --    < > 0.28* 0.27* 0.33 0.27*  CREATININE 1.70*  --   --   --  1.38*  --   --   --    < > = values in this interval not displayed.   Assessment: 54 YOM who presented with an ischemic stroke s/p alteplase at 2000 on 8/16. 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 Pharmacy consulted to start IV heparin per stroke protocol. 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 Heparin drip turned off overnight for increased bruising/possible bleeding from groin area. Per discussion with stroke team Pearlean Brownie) this AM, issues seem to be resolved and ok to resume heparin now with warfarin bridge. Patient was not on anticoagulation PTA.  Heparin level is now 0.27 on 1450 units/hr of IV heparin. No s/s of overt bleeding noted per RN   Goal of Therapy:  INR 2-3 Heparin level 0.3-0.5 units/ml Monitor platelets by anticoagulation protocol: Yes   Plan:  Increase heparin gtt to 1550 units/hr F/u AM HL  Daily INR, CBC, s/s bleeding Thanks for allowing pharmacy to be a part of this patient's care.  Talbert Cage, PharmD Clinical Pharmacist

## 2021-07-24 NOTE — Progress Notes (Signed)
ANTICOAGULATION CONSULT NOTE   Pharmacy Consult for heparin and warfarin Indication:  Apical thrombus (recent stroke)  Labs: Recent Labs    07/22/21 0449 07/22/21 1835 07/23/21 0225 07/23/21 1137 07/23/21 2051 07/24/21 0238 07/24/21 1157  HGB 12.2*  --  11.7*  --   --  11.0*  --   HCT 37.9*  --  34.5*  --   --  32.9*  --   PLT 211  --  194  --   --  199  --   LABPROT  --   --  14.2  --   --  15.6*  --   INR  --   --  1.1  --   --  1.2  --   HEPARINUNFRC  --    < > 0.28*   < > 0.33 0.27* 0.45  CREATININE  --   --  1.38*  --   --  1.37*  --    < > = values in this interval not displayed.   Assessment: 51 YOM who presented with an ischemic stroke s/p alteplase at 2000 on 8/16. 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 Currently on warfarin with heparin bridge. Pharmacy consulted to switch IV heparin to SQ Lovenox bridge   H/H mildly elevated. Plt wnl.    Goal of Therapy:  INR 2-3 Anti-Xa level 0.6-1 units/ml 4hrs after LMWH dose given Monitor platelets by anticoagulation protocol: Yes   Plan:  Stop heparin heparin drip and start Lovenox 90 mg (1 mg/kg) twice daily)  Warfarin 5 mg x 1 dose today  Daily INR, CBC, s/s bleeding Will plan to stop Lovenox when INR is therapeutic    Vinnie Level, PharmD., BCPS, BCCCP Clinical Pharmacist Please refer to [facility] for unit-specific pharmacist

## 2021-07-24 NOTE — Progress Notes (Addendum)
STROKE TEAM PROGRESS NOTE   ATTENDING NOTE: I reviewed above note and agree with the assessment and Dickson. Pt was seen and examined.   No acute event overnight, neuro stable.  Patient still on heparin IV in bridging with Coumadin, INR today 1.2.  Patient tolerating well with heparin IV, will transition to Lovenox therapeutic dose.  Pending CIR placement.  Patient still has bilateral inner thigh significant bruises and soft tissue hematoma, hemoglobin stable, more prominent discoloration but no size enlargement.  This likely due to groin procedure in the setting of heparin use.  We will continue to monitor.  For detailed assessment and Dickson, please refer to above as I have made changes wherever appropriate.   Roberto Plan, MD PhD Stroke Neurology 07/24/2021 4:17 PM    Interval History  Patient resting in bed with wife and daughter at bedside. Neurological exam is stable and they state that his speech is back at baseline.   Continues on Coumadin with IV heparin bridge, INR 1.2 today.   Still has leukocytosis, repeat UA with moderate leukocytes + bacteria.   Pertinent Lab Work and Imaging    07/19/21 CT Head WO IV Contrast 1. No acute intracranial abnormality. 2. 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 3. ASPECTS is 10.  07/19/21 CT Angio Head and Neck W WO IV Contrast 1. No intracranial arterial occlusion or high-grade stenosis. 2. 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 The left MCA is opacified via collateral flow across the anterior communicating artery.  MRI Brain WO IV Contrast Scattered acute infarctions within the left cerebellum and both cerebral hemispheres, consistent with cardiac or ascending aortic source. No large confluent acute infarction. The single largest acute insult is about a cm and a half in size and located in the left posterior frontal region, probably the precentral gyrus  Echocardiogram Complete  Left ventricular  apical thrombus measuring 2.1 x 1.6 cm with apical, apical anterior, apical septal kinesis and inferior septal hypokinesis.  Left ventricular ejection fraction slightly diminished at 50 to 55%.  Physical Examination   Temp:  [98.1 F (36.7 C)-100.1 F (37.8 C)] 98.1 F (36.7 C) (08/21 0352) Pulse Rate:  [89-100] 93 (08/21 0352) Resp:  [22-25] 22 (08/20 1357) BP: (117-138)/(62-79) 126/62 (08/21 0352) SpO2:  [95 %-96 %] 96 % (08/20 2100)  General - Well nourished, well developed, in no apparent distress.  Ophthalmologic - fundi not visualized due to noncooperation.  Cardiovascular - Regular rhythm and rate.  Mental Status - AAOx4, following commands   Speech- Naming and repetition intact. No dysarthria   Cranial Nerves II - XII - II - Visual field intact OU. III, IV, VI - Extraocular movements intact. V - Facial sensation intact bilaterally. VII - right mild facial droop VIII - Hearing & vestibular intact bilaterally. X - Palate elevates symmetrically.  XI - Chin turning & shoulder shrug intact bilaterally. XII - Tongue protrusion intact.  Motor Strength - The patient's strength was normal in left UE and LE but RUE pronator drift present with RUE proximal 4+/5 and distal finger grip 1/5. RLE proximal 5-/5 and distal ankle DF/PF 4/5.  Bulk was normal and fasciculations were absent.    Motor Tone - Muscle tone was assessed at the neck and appendages and was normal.  Reflexes - The patient's reflexes were symmetrical in all extremities and he had no pathological reflexes.  Sensory - Light touch, temperature/pinprick were assessed and were symmetrical.    Coordination -  The patient had normal movements in the hands with no ataxia or dysmetria.  Tremor was absent.  Gait and Station - deferred.    Assessment and Dickson   Roberto Dickson is a 80 y.o. male w/pmh of HTN who presents with L MCA syndrome, received IVTPA and underwent mechanical thrombectomy.   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 CTA Head and Neck showed LICA occlusion, no significant atherosclerosis. MRI Brain showed left cerebellum, right MCA/PCA, MCA/ACA, left MCA/PCA, MCA and ACA scattered infarcts. Echocardiogram EF 50 to 55% with LV thrombus. LDL 132 and A1C 5.9.  UDS negative -On heparin IV bridged to Coumadin, INR today 1.2  -Atorvastatin 40 mg for stroke prevention  -PT/OT/ST  -At discharge will place ambulatory referral to neurology for stroke follow up   CARDS #LV thrombus TTE showed EF 50 to 55% with LV thrombus Cardiology on board On heparin IV bridged to Coumadin On metoprolol 25  #Hypertension He has a history of HTN, does not appear to be taking any medications at home.  - Long term blood pressure goal normotensive - On metoprolol 25  #Rt groin pseudoaneurysm postprocedure  -Vascular surgery consulted -s/p thrombin injection 07/21/21  -Repeat ultrasound showed pseudoaneurysm resolution  #Hyperlipidemia From a stroke prevention stand point, the LDL goal is < 70. LDL is 132, Atorvastatin 40 mg started this admission.  Continue on discharge  RENAL #AKI versus CKD  Cr elevated at 1.74-1.70-1.38, unclear what baseline Cr is.   Continue to trend, encourage PO fluids  ENDO #Prediabetes  Hemoglobin A1C this admission noted to be 5.9, in the prediabetic range. Will manage hyperglycemia with SSI if necessary   HEME #DVT Prophylaxis  Hemoglobin hematocrit and platelet count stable.  On heparin IV bridging to Coumadin  ID #Leukocytosis  #Spiking fever Afebrile. No infectious processes at this time. He has a mild leukocytosis WBC 17.2-12.0-12.8 T-max 100.8 UA 8/18 WBC >50, 8/20 UA 21-50 Started Rocephin on 07/23/21, duration TBD based on urine culture that is processing   Hospital day # 5  Roberto Jock, NP  Stroke Service Nurse  Practitioner Patient seen and discussed with attending physician Dr. Roda Shutters    To contact Stroke Continuity provider, please refer to [email or web address]. After hours, contact General Neurology

## 2021-07-25 DIAGNOSIS — I63512 Cerebral infarction due to unspecified occlusion or stenosis of left middle cerebral artery: Secondary | ICD-10-CM | POA: Diagnosis not present

## 2021-07-25 DIAGNOSIS — I6522 Occlusion and stenosis of left carotid artery: Secondary | ICD-10-CM | POA: Diagnosis not present

## 2021-07-25 LAB — CBC
HCT: 31.7 % — ABNORMAL LOW (ref 39.0–52.0)
Hemoglobin: 10.7 g/dL — ABNORMAL LOW (ref 13.0–17.0)
MCH: 31.8 pg (ref 26.0–34.0)
MCHC: 33.8 g/dL (ref 30.0–36.0)
MCV: 94.3 fL (ref 80.0–100.0)
Platelets: 214 10*3/uL (ref 150–400)
RBC: 3.36 MIL/uL — ABNORMAL LOW (ref 4.22–5.81)
RDW: 14.1 % (ref 11.5–15.5)
WBC: 15.1 10*3/uL — ABNORMAL HIGH (ref 4.0–10.5)
nRBC: 0 % (ref 0.0–0.2)

## 2021-07-25 LAB — URINE CULTURE: Culture: NO GROWTH

## 2021-07-25 LAB — BASIC METABOLIC PANEL
Anion gap: 11 (ref 5–15)
BUN: 25 mg/dL — ABNORMAL HIGH (ref 8–23)
CO2: 19 mmol/L — ABNORMAL LOW (ref 22–32)
Calcium: 7.9 mg/dL — ABNORMAL LOW (ref 8.9–10.3)
Chloride: 102 mmol/L (ref 98–111)
Creatinine, Ser: 1.54 mg/dL — ABNORMAL HIGH (ref 0.61–1.24)
GFR, Estimated: 46 mL/min — ABNORMAL LOW (ref >60)
Glucose, Bld: 101 mg/dL — ABNORMAL HIGH (ref 70–99)
Potassium: 3.7 mmol/L (ref 3.5–5.1)
Sodium: 132 mmol/L — ABNORMAL LOW (ref 135–145)

## 2021-07-25 LAB — PROTIME-INR
INR: 1.3 — ABNORMAL HIGH (ref 0.8–1.2)
Prothrombin Time: 16.6 seconds — ABNORMAL HIGH (ref 11.4–15.2)

## 2021-07-25 MED ORDER — WARFARIN SODIUM 7.5 MG PO TABS
7.5000 mg | ORAL_TABLET | Freq: Once | ORAL | Status: AC
  Administered 2021-07-25: 7.5 mg via ORAL
  Filled 2021-07-25: qty 1

## 2021-07-25 NOTE — Progress Notes (Addendum)
Neurology Progress Note  ATTENDING NOTE: I reviewed above note and agree with the assessment and plan. Pt was seen and examined.   No acute event overnight, neuro stable.  Bilateral inner thigh extensive ecchymosis and soft tissue hematoma stable, although mildly painful on leg movement.  Heparin IV switched to Lovenox therapeutic dose yesterday, INR today 1.3.  Continue Coumadin.  Pending CIR placement.  For detailed assessment and plan, please refer to above as I have made changes wherever appropriate.   Marvel Plan, MD PhD Stroke Neurology 07/25/2021 4:25 PM    S:// Patient is sitting in the chair. No family present during my exam. No new neurological events and states he feels well.  He is currently on Coumadin and Lovenox. INR today is 1.3.  O:// Current vital signs: BP 114/72 (BP Location: Right Arm)   Pulse 100   Temp 99.6 F (37.6 C) (Oral)   Resp (!) 22   Wt 90.5 kg   SpO2 96%  Vital signs in last 24 hours: Temp:  [98.7 F (37.1 C)-99.6 F (37.6 C)] 99.6 F (37.6 C) (08/22 0352) Pulse Rate:  [100] 100 (08/22 0352) BP: (113-114)/(59-72) 114/72 (08/21 2133)  GENERAL: Awake, alert in NAD HEENT: - Normocephalic and atraumatic, dry mm LUNGS - Clear to auscultation bilaterally with no wheezes CV - S1S2 RRR, no m/r/g, equal pulses bilaterally. ABDOMEN - Soft, nontender, nondistended with normoactive BS Ext: bilateral inner thigh bruising, no size enlargement from markings, soft older in nature more yelloeish, purple discoloration. warm, well perfused, intact peripheral pulses, no edema NEURO:  Mental Status: AA&Ox3, follows commands  Language: speech is clear, no dysarthria.  Naming, repetition, fluency, and comprehension intact. Cranial Nerves: PERRL 3 mm/brisk. EOMI, visual fields full, slight right facial asymmetry, facial sensation intact, hearing intact, tongue/uvula/soft palate midline, normal sternocleidomastoid and trapezius muscle strength. No evidence of tongue  atrophy or fibrillations Motor: RUE and RLE 4/5 and weak right grip 1/5. Left Upper and Lower 5/5.  Tone: is normal and bulk is normal Sensation- Intact to light touch bilaterally Coordination: FTN intact bilaterally, no ataxia in BLE. Gait- deferred   Medications  Current Facility-Administered Medications:    0.9 %  sodium chloride infusion, 250 mL, Intravenous, Continuous, Kirtland Bouchard, MD   acetaminophen (TYLENOL) tablet 650 mg, 650 mg, Oral, Q4H PRN, 650 mg at 07/25/21 0226 **OR** acetaminophen (TYLENOL) 160 MG/5ML solution 650 mg, 650 mg, Per Tube, Q4H PRN **OR** acetaminophen (TYLENOL) suppository 650 mg, 650 mg, Rectal, Q4H PRN, Deveshwar, Sanjeev, MD   atorvastatin (LIPITOR) tablet 40 mg, 40 mg, Oral, Daily, Agarwala, Ravi, MD, 40 mg at 07/25/21 0831   cefTRIAXone (ROCEPHIN) 1 g in sodium chloride 0.9 % 100 mL IVPB, 1 g, Intravenous, Q24H, Marvel Plan, MD, Last Rate: 200 mL/hr at 07/24/21 1547, 1 g at 07/24/21 1547   Chlorhexidine Gluconate Cloth 2 % PADS 6 each, 6 each, Topical, Daily, Erick Blinks, MD, 6 each at 07/25/21 1045   docusate sodium (COLACE) capsule 100 mg, 100 mg, Oral, BID, Leander Rams, RPH, 100 mg at 07/25/21 0831   enoxaparin (LOVENOX) injection 90 mg, 1 mg/kg, Subcutaneous, BID, Mancheril, Candis Schatz, RPH, 90 mg at 07/25/21 0831   metoprolol succinate (TOPROL-XL) 24 hr tablet 25 mg, 25 mg, Oral, QHS, Jodelle Red, MD, 25 mg at 07/24/21 2150   pantoprazole (PROTONIX) EC tablet 40 mg, 40 mg, Oral, QHS, Micki Riley, MD, 40 mg at 07/24/21 2150   polyethylene glycol (MIRALAX / GLYCOLAX) packet 17 g, 17  g, Oral, Daily, Erick Blinks, MD, 17 g at 07/25/21 0831   senna-docusate (Senokot-S) tablet 1 tablet, 1 tablet, Oral, QHS PRN, Erick Blinks, MD   tamsulosin [facility]) capsule 0.4 mg, 0.4 mg, Oral, Daily, Erick Blinks, MD, 0.4 mg at 07/25/21 0277   thrombin spray, , , PRN, Gilmer Mor, DO, 5,000 Units at 07/21/21 1510    warfarin (COUMADIN) tablet 7.5 mg, 7.5 mg, Oral, ONCE-1600, Joaquim Nam, [facility]   Warfarin - Pharmacist Dosing Inpatient, , Does not apply, q1600, von Pearletha Furl, [facility] Labs CBC    Component Value Date/Time   WBC 15.1 (H) 07/25/2021 0443   RBC 3.36 (L) 07/25/2021 0443   HGB 10.7 (L) 07/25/2021 0443   HCT 31.7 (L) 07/25/2021 0443   PLT 214 07/25/2021 0443   MCV 94.3 07/25/2021 0443   MCH 31.8 07/25/2021 0443   MCHC 33.8 07/25/2021 0443   RDW 14.1 07/25/2021 0443   LYMPHSABS 0.5 (L) 07/20/2021 0536   MONOABS 0.2 07/20/2021 0536   EOSABS 0.0 07/20/2021 0536   BASOSABS 0.0 07/20/2021 0536    CMP     Component Value Date/Time   NA 132 (L) 07/25/2021 0443   K 3.7 07/25/2021 0443   CL 102 07/25/2021 0443   CO2 19 (L) 07/25/2021 0443   GLUCOSE 101 (H) 07/25/2021 0443   BUN 25 (H) 07/25/2021 0443   CREATININE 1.54 (H) 07/25/2021 0443   CALCIUM 7.9 (L) 07/25/2021 0443   PROT 6.9 07/19/2021 1947   ALBUMIN 3.4 (L) 07/19/2021 1947   AST 23 07/19/2021 1947   ALT 15 07/19/2021 1947   ALKPHOS 54 07/19/2021 1947   BILITOT 1.1 07/19/2021 1947   GFRNONAA 46 (L) 07/25/2021 0443    glycosylated hemoglobin  Lipid Panel     Component Value Date/Time   CHOL 179 07/20/2021 0536   TRIG 89 07/21/2021 0432   HDL 30 (L) 07/20/2021 0536   CHOLHDL 6.0 07/20/2021 0536   VLDL 17 07/20/2021 0536   LDLCALC 132 (H) 07/20/2021 0536     Imaging I have reviewed images in epic and the results pertinent to this consultation are:  07/19/21 CT Head WO IV Contrast 1. No acute intracranial abnormality. 2. 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 3. ASPECTS is 10.  07/19/21 CT Angio Head and Neck W WO IV Contrast 1. No intracranial arterial occlusion or high-grade stenosis. 2. 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 The left MCA is opacified via collateral flow across the anterior communicating artery.  MRI Brain WO IV Contrast Scattered acute  infarctions within the left cerebellum and both cerebral hemispheres, consistent with cardiac or ascending aortic source. No large confluent acute infarction. 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  Left ventricular ejection fraction slightly diminished at 50 to 55%.  Assessment/Plan: Mr. ANISH VANA is a 80 y.o. 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 CTA Head and Neck showed LICA occlusion, no significant atherosclerosis. MRI Brain showed left cerebellum, right MCA/PCA, MCA/ACA, left MCA/PCA, MCA and ACA scattered  infarcts. -Echocardiogram EF 50 to 55% with LV thrombus.  -LDL 132 and A1C 5.9.  UDS negative -On coumadin and Lovenox. INR 1.3. INR goal 2-3 and then stop Lovenox -Continue Atorvastatin 40 mg -PT/OT/ST  -At discharge will place ambulatory referral to neurology for stroke follow up    CARDS LV thrombus TTE showed EF 50 to 55% with LV thrombus Cardiology on board On Coumadin and Lovenox bridge On metoprolol 25   Hypertension He has a history of HTN, does not appear to be taking any medications at home.  - Long term blood pressure goal normotensive - On metoprolol 25   Rt groin pseudoaneurysm postprocedure  -Vascular surgery consulted -s/p thrombin injection 07/21/21  -Repeat ultrasound showed pseudoaneurysm resolution   Hyperlipidemia  LDL is 132, Atorvastatin 40 mg started this admission.  Continue on discharge   RENAL AKI versus CKD  Cr elevated at 1.54, unclear what  baseline Cr is.   Continue to trend, encourage PO fluids   ENDO HgbA1C 5.9, in the prediabetic range.     ID UTI Afebrile. No infectious processes at this  CBC 15.1 T-max 99.6 UA 8/18 WBC >50, 8/20 UA 21-50 Rocephin day 3/5 U cx is pending    Hospital day #6  Gevena Mart, NP

## 2021-07-25 NOTE — Progress Notes (Signed)
ANTICOAGULATION CONSULT NOTE   Pharmacy Consult for Heparin to Lovenox and Warfarin Indication:  Apical thrombus (recent stroke)  Labs: Recent Labs    07/23/21 0225 07/23/21 1137 07/23/21 2051 07/24/21 0238 07/24/21 1157 07/25/21 0443  HGB 11.7*  --   --  11.0*  --  10.7*  HCT 34.5*  --   --  32.9*  --  31.7*  PLT 194  --   --  199  --  214  LABPROT 14.2  --   --  15.6*  --  16.6*  INR 1.1  --   --  1.2  --  1.3*  HEPARINUNFRC 0.28*   < > 0.33 0.27* 0.45  --   CREATININE 1.38*  --   --  1.37*  --  1.54*   < > = values in this interval not displayed.   Assessment: 51 YOM who presented with an ischemic stroke s/p alteplase at 2000 on 8/16. 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 Currently on warfarin with Lovenox  H/H mildly elevated. Plt wnl.    Goal of Therapy:  INR 2-3 Anti-Xa level 0.6-1 units/ml 4hrs after LMWH dose given Monitor platelets by anticoagulation protocol: Yes   Plan:  Lovenox 90 mg (1 mg/kg) twice daily)  Warfarin 7.5 mg x 1 dose today  Daily INR, CBC, s/s bleeding Will plan to stop Lovenox when INR is therapeutic   Thank you Okey Regal, PharmD  Please refer to [facility] for unit-specific pharmacist

## 2021-07-25 NOTE — Progress Notes (Signed)
Physical Therapy Treatment Patient Details Name: Roberto Dickson MRN: [id number] DOB: 07/15/1941 Today's Date: 07/25/2021    History of Present Illness Pt is a 80 y.o. male who presented 07/19/21 with L gaze deviation, R facial droop, R weakness, and aphasia. tPA was administered. 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 S/p revascularization of L ICA 8/16. ETT 8/16-8/17. PMH: HTN    PT Comments    Pt admitted with above diagnosis. Pt was able to use RW with mod to min assist of 2 to pivot and begin ambulation today. Pt progressing overall.  Pt is able to use his right hemibody for functional mobility.  Pt currently with functional limitations due to balance and endurance deficits. Pt will benefit from skilled PT to increase their independence and safety with mobility to allow discharge to the venue listed below.      Follow Up Recommendations  CIR;Supervision/Assistance - 24 hour     Equipment Recommendations  Rolling walker with 5" wheels;3in1 (PT)    Recommendations for Other Services Rehab consult     Precautions / Restrictions Precautions Precautions: Fall Precaution Comments: watch BP, interpreter [phone] Michaela Corner) and then [id number] Fawn Kirk). Restrictions Weight Bearing Restrictions: No    Mobility  Bed Mobility Overal bed mobility: Needs Assistance Bed Mobility: Rolling;Supine to Sit Rolling: +2 for physical assistance;Mod assist   Supine to sit: +2 for physical assistance;Mod assist     General bed mobility comments: pt able to progress bil LE toward EOB with (A) to place off  bed. pt reaching with R UE toward bed rail with decreased shoulder flexion, needing hand-over-hand to direct it to rail. pt pushing up with L UE to come to static sitting.    Transfers Overall transfer level: Needs assistance Equipment used: Rolling walker (2 wheeled) Transfers: Sit to/from [employer] Sit to Stand: +2 physical assistance;Max assist;Mod assist Stand pivot transfers:  +2 physical assistance;Mod assist       General transfer comment: ModAx2 to power up to stand and steady, no sliding of feet this date. ModAx2 to steady with pt taking a little time to get steady with RW.  Pt then able to take pivotal steps to chair, did not note R knee instability. Asked pt to walk forward and back and pt was able to - see belwo.  Ambulation/Gait Ambulation/Gait assistance: Mod assist;+2 physical assistance Gait Distance (Feet): 5 Feet Assistive device: Rolling walker (2 wheeled) Gait Pattern/deviations: Decreased stride length;Decreased step length - right;Decreased dorsiflexion - right;Narrow base of support;Decreased step length - left Gait velocity: reduced Gait velocity interpretation: <1.31 ft/sec, indicative of household ambulator General Gait Details: Pt was able to take steps forward and then back with mod cues and min to mod assist for stability with use of RW. Pt needed cues to take larger steps as he was taking small steps unless cued. Was able to move his right LE on his own.   Stairs             Wheelchair Mobility    Modified Rankin (Stroke Patients Only) Modified Rankin (Stroke Patients Only) Pre-Morbid Rankin Score: No symptoms Modified Rankin: Moderately severe disability     Balance Overall balance assessment: Needs assistance Sitting-balance support: Single extremity supported;Feet supported Sitting balance-Leahy Scale: Fair Sitting balance - Comments: pt static sitting with min guard (A) Postural control: Right lateral lean Standing balance support: Bilateral upper extremity supported;During functional activity Standing balance-Leahy Scale: Poor Standing balance comment: UE support and min-modAx2.  Cognition Arousal/Alertness: Awake/alert Behavior During Therapy: Flat affect Overall Cognitive Status:  (to be further assessed for higher executive function. delayed responses to some  questions) Area of Impairment: Following commands;Safety/judgement;Awareness;Problem solving                       Following Commands: Follows one step commands inconsistently;Follows one step commands with increased time Safety/Judgement: Decreased awareness of safety;Decreased awareness of deficits Awareness: Emergent Problem Solving: Slow processing;Difficulty sequencing;Requires verbal cues;Requires tactile cues General Comments: pt answering questions with delay at times. pt able to follow 2 step commands.      Exercises      General Comments General comments (skin integrity, edema, etc.): VSS on 5L HFNC O2      Pertinent Vitals/Pain Pain Assessment: No/denies pain    Home Living                      Prior Function            PT Goals (current goals can now be found in the care plan section) Acute Rehab PT Goals Patient Stated Goal: to get therapy and return to paint by numbers Progress towards PT goals: Progressing toward goals    Frequency    Min 4X/week      PT Plan Current plan remains appropriate    Co-evaluation              AM-PAC PT "6 Clicks" Mobility   Outcome Measure  Help needed turning from your back to your side while in a flat bed without using bedrails?: A Lot Help needed moving from lying on your back to sitting on the side of a flat bed without using bedrails?: A Lot Help needed moving to and from a bed to a chair (including a wheelchair)?: Total Help needed standing up from a chair using your arms (e.g., wheelchair or bedside chair)?: Total Help needed to walk in hospital room?: Total Help needed climbing 3-5 steps with a railing? : Total 6 Click Score: 8    End of Session Equipment Utilized During Treatment: Gait belt;Oxygen Activity Tolerance: Patient tolerated treatment well Patient left: in chair;with call bell/phone within reach;with chair alarm set Nurse Communication: Mobility status PT Visit Diagnosis:  Unsteadiness on feet (R26.81);Other abnormalities of gait and mobility (R26.89);Muscle weakness (generalized) (M62.81);Difficulty in walking, not elsewhere classified (R26.2);Other symptoms and signs involving the nervous system (R29.898);Hemiplegia and hemiparesis Hemiplegia - Right/Left: Right Hemiplegia - dominant/non-dominant: Dominant Hemiplegia - caused by: Cerebral infarction     Time: 3734-2876 PT Time Calculation (min) (ACUTE ONLY): 23 min  Charges:  $Gait Training: 8-22 mins $Therapeutic Activity: 8-22 mins                     Rea Reser M,PT Acute Rehab Services [phone] (pager)    Bevelyn Buckles 07/25/2021, 12:08 PM

## 2021-07-25 NOTE — Care Management Important Message (Signed)
Important Message  Patient Details  Name: Roberto Dickson MRN: [id number] Date of Birth: [birth year]   Medicare Important Message Given:  Yes     Roberto Dickson 07/25/2021, 3:20 PM

## 2021-07-25 NOTE — Progress Notes (Signed)
Inpatient Rehab Admissions Coordinator:   I spoke with Pt.'s daughter Byrd Hesselbach about potential CIR admit. She is interested but states that Pt. Will need to be at min A to supervision level for her mother to handle him. I will discuss case with rehab MD tomorrow and see if they feel it is a reasonable goal. Pt.'s daughter also requested that we try to locate Pt.'s medicaid policy so I will work on that as well.  Megan Salon, MS, CCC-SLP Rehab Admissions Coordinator  [phone] (celll) [phone] (office)

## 2021-07-26 ENCOUNTER — Other Ambulatory Visit: Payer: Self-pay

## 2021-07-26 ENCOUNTER — Encounter (HOSPITAL_COMMUNITY): Payer: Self-pay | Admitting: Neurology

## 2021-07-26 ENCOUNTER — Encounter (HOSPITAL_COMMUNITY): Payer: Self-pay | Admitting: Physical Medicine & Rehabilitation

## 2021-07-26 ENCOUNTER — Inpatient Hospital Stay (HOSPITAL_COMMUNITY)
Admission: RE | Admit: 2021-07-26 | Discharge: 2021-08-10 | DRG: 057 | Disposition: A | Discharge status: Home Health | Payer: Medicare Other | Source: Intra-hospital | Attending: Physical Medicine & Rehabilitation | Admitting: Physical Medicine & Rehabilitation

## 2021-07-26 DIAGNOSIS — D62 Acute posthemorrhagic anemia: Secondary | ICD-10-CM | POA: Diagnosis present

## 2021-07-26 DIAGNOSIS — M25361 Other instability, right knee: Secondary | ICD-10-CM | POA: Diagnosis present

## 2021-07-26 DIAGNOSIS — M79605 Pain in left leg: Secondary | ICD-10-CM | POA: Diagnosis not present

## 2021-07-26 DIAGNOSIS — Z79899 Other long term (current) drug therapy: Secondary | ICD-10-CM | POA: Diagnosis not present

## 2021-07-26 DIAGNOSIS — K5900 Constipation, unspecified: Secondary | ICD-10-CM | POA: Diagnosis present

## 2021-07-26 DIAGNOSIS — I6522 Occlusion and stenosis of left carotid artery: Secondary | ICD-10-CM | POA: Diagnosis present

## 2021-07-26 DIAGNOSIS — R7303 Prediabetes: Secondary | ICD-10-CM | POA: Diagnosis present

## 2021-07-26 DIAGNOSIS — Y838 Other surgical procedures as the cause of abnormal reaction of the patient, or of later complication, without mention of misadventure at the time of the procedure: Secondary | ICD-10-CM | POA: Diagnosis present

## 2021-07-26 DIAGNOSIS — R633 Feeding difficulties, unspecified: Secondary | ICD-10-CM | POA: Diagnosis present

## 2021-07-26 DIAGNOSIS — G479 Sleep disorder, unspecified: Secondary | ICD-10-CM

## 2021-07-26 DIAGNOSIS — I63512 Cerebral infarction due to unspecified occlusion or stenosis of left middle cerebral artery: Secondary | ICD-10-CM

## 2021-07-26 DIAGNOSIS — D72829 Elevated white blood cell count, unspecified: Secondary | ICD-10-CM | POA: Diagnosis present

## 2021-07-26 DIAGNOSIS — Z741 Need for assistance with personal care: Secondary | ICD-10-CM | POA: Diagnosis present

## 2021-07-26 DIAGNOSIS — H9190 Unspecified hearing loss, unspecified ear: Secondary | ICD-10-CM | POA: Diagnosis present

## 2021-07-26 DIAGNOSIS — R630 Anorexia: Secondary | ICD-10-CM | POA: Diagnosis present

## 2021-07-26 DIAGNOSIS — N179 Acute kidney failure, unspecified: Secondary | ICD-10-CM | POA: Diagnosis present

## 2021-07-26 DIAGNOSIS — R7989 Other specified abnormal findings of blood chemistry: Secondary | ICD-10-CM | POA: Diagnosis not present

## 2021-07-26 DIAGNOSIS — I129 Hypertensive chronic kidney disease with stage 1 through stage 4 chronic kidney disease, or unspecified chronic kidney disease: Secondary | ICD-10-CM | POA: Diagnosis present

## 2021-07-26 DIAGNOSIS — E871 Hypo-osmolality and hyponatremia: Secondary | ICD-10-CM | POA: Diagnosis present

## 2021-07-26 DIAGNOSIS — G4700 Insomnia, unspecified: Secondary | ICD-10-CM | POA: Diagnosis not present

## 2021-07-26 DIAGNOSIS — M25561 Pain in right knee: Secondary | ICD-10-CM | POA: Diagnosis present

## 2021-07-26 DIAGNOSIS — L7632 Postprocedural hematoma of skin and subcutaneous tissue following other procedure: Secondary | ICD-10-CM | POA: Diagnosis present

## 2021-07-26 DIAGNOSIS — I69322 Dysarthria following cerebral infarction: Secondary | ICD-10-CM

## 2021-07-26 DIAGNOSIS — E785 Hyperlipidemia, unspecified: Secondary | ICD-10-CM | POA: Diagnosis present

## 2021-07-26 DIAGNOSIS — I69351 Hemiplegia and hemiparesis following cerebral infarction affecting right dominant side: Principal | ICD-10-CM

## 2021-07-26 DIAGNOSIS — N189 Chronic kidney disease, unspecified: Secondary | ICD-10-CM | POA: Diagnosis present

## 2021-07-26 DIAGNOSIS — M19071 Primary osteoarthritis, right ankle and foot: Secondary | ICD-10-CM | POA: Diagnosis present

## 2021-07-26 DIAGNOSIS — N1832 Chronic kidney disease, stage 3b: Secondary | ICD-10-CM

## 2021-07-26 DIAGNOSIS — I10 Essential (primary) hypertension: Secondary | ICD-10-CM | POA: Diagnosis not present

## 2021-07-26 LAB — BASIC METABOLIC PANEL
Anion gap: 8 (ref 5–15)
BUN: 29 mg/dL — ABNORMAL HIGH (ref 8–23)
CO2: 21 mmol/L — ABNORMAL LOW (ref 22–32)
Calcium: 7.7 mg/dL — ABNORMAL LOW (ref 8.9–10.3)
Chloride: 103 mmol/L (ref 98–111)
Creatinine, Ser: 1.63 mg/dL — ABNORMAL HIGH (ref 0.61–1.24)
GFR, Estimated: 43 mL/min — ABNORMAL LOW (ref >60)
Glucose, Bld: 105 mg/dL — ABNORMAL HIGH (ref 70–99)
Potassium: 3.8 mmol/L (ref 3.5–5.1)
Sodium: 132 mmol/L — ABNORMAL LOW (ref 135–145)

## 2021-07-26 LAB — CBC
HCT: 30.8 % — ABNORMAL LOW (ref 39.0–52.0)
Hemoglobin: 10.6 g/dL — ABNORMAL LOW (ref 13.0–17.0)
MCH: 32 pg (ref 26.0–34.0)
MCHC: 34.4 g/dL (ref 30.0–36.0)
MCV: 93.1 fL (ref 80.0–100.0)
Platelets: 254 10*3/uL (ref 150–400)
RBC: 3.31 MIL/uL — ABNORMAL LOW (ref 4.22–5.81)
RDW: 13.8 % (ref 11.5–15.5)
WBC: 12.4 10*3/uL — ABNORMAL HIGH (ref 4.0–10.5)
nRBC: 0 % (ref 0.0–0.2)

## 2021-07-26 LAB — PROTIME-INR
INR: 1.7 — ABNORMAL HIGH (ref 0.8–1.2)
Prothrombin Time: 20.3 seconds — ABNORMAL HIGH (ref 11.4–15.2)

## 2021-07-26 MED ORDER — ATORVASTATIN CALCIUM 40 MG PO TABS: 40.0000 mg | ORAL_TABLET | Freq: Every day | ORAL | Status: DC

## 2021-07-26 MED ORDER — DIPHENHYDRAMINE HCL 12.5 MG/5ML PO ELIX: 12.5000 mg | ORAL_SOLUTION | Freq: Four times a day (QID) | ORAL | Status: DC | PRN

## 2021-07-26 MED ORDER — WARFARIN SODIUM 7.5 MG PO TABS: 7.5000 mg | ORAL_TABLET | Freq: Once | ORAL | Status: DC

## 2021-07-26 MED ORDER — ACETAMINOPHEN 325 MG PO TABS
325.0000 mg | ORAL_TABLET | ORAL | Status: DC | PRN
  Administered 2021-07-27 – 2021-08-09 (×11): 650 mg via ORAL
  Filled 2021-07-26 (×14): qty 2

## 2021-07-26 MED ORDER — CEFTRIAXONE SODIUM 1 G IJ SOLR
1.0000 g | INTRAMUSCULAR | Status: AC
  Administered 2021-07-27 – 2021-07-28 (×2): 1 g via INTRAVENOUS
  Filled 2021-07-26 (×2): qty 10

## 2021-07-26 MED ORDER — GUAIFENESIN-DM 100-10 MG/5ML PO SYRP
5.0000 mL | ORAL_SOLUTION | Freq: Four times a day (QID) | ORAL | Status: DC | PRN
  Filled 2021-07-26: qty 10

## 2021-07-26 MED ORDER — ENOXAPARIN SODIUM 100 MG/ML IJ SOSY
1.0000 mg/kg | PREFILLED_SYRINGE | Freq: Two times a day (BID) | INTRAMUSCULAR | Status: DC
  Administered 2021-07-27 – 2021-07-28 (×3): 90 mg via SUBCUTANEOUS
  Filled 2021-07-26 (×3): qty 1

## 2021-07-26 MED ORDER — METOPROLOL SUCCINATE ER 25 MG PO TB24
25.0000 mg | ORAL_TABLET | Freq: Every day | ORAL | Status: DC
  Administered 2021-07-28 – 2021-08-07 (×11): 25 mg via ORAL
  Filled 2021-07-26 (×12): qty 1

## 2021-07-26 MED ORDER — PROCHLORPERAZINE EDISYLATE 10 MG/2ML IJ SOLN: 5.0000 mg | Freq: Four times a day (QID) | INTRAMUSCULAR | Status: DC | PRN

## 2021-07-26 MED ORDER — PROCHLORPERAZINE 25 MG RE SUPP: 12.5000 mg | Freq: Four times a day (QID) | RECTAL | Status: DC | PRN

## 2021-07-26 MED ORDER — ATORVASTATIN CALCIUM 40 MG PO TABS
40.0000 mg | ORAL_TABLET | Freq: Every day | ORAL | Status: DC
  Administered 2021-07-27 – 2021-08-10 (×15): 40 mg via ORAL
  Filled 2021-07-26 (×15): qty 1

## 2021-07-26 MED ORDER — TAMSULOSIN HCL 0.4 MG PO CAPS
0.4000 mg | ORAL_CAPSULE | Freq: Every day | ORAL | Status: DC
  Administered 2021-07-27 – 2021-08-10 (×15): 0.4 mg via ORAL
  Filled 2021-07-26 (×15): qty 1

## 2021-07-26 MED ORDER — WARFARIN SODIUM 7.5 MG PO TABS
7.5000 mg | ORAL_TABLET | Freq: Once | ORAL | Status: DC
  Administered 2021-07-26: 7.5 mg via ORAL
  Filled 2021-07-26: qty 1

## 2021-07-26 MED ORDER — TRAZODONE HCL 50 MG PO TABS
25.0000 mg | ORAL_TABLET | Freq: Every evening | ORAL | Status: DC | PRN
  Administered 2021-07-28: 50 mg via ORAL
  Filled 2021-07-26: qty 1

## 2021-07-26 MED ORDER — METOPROLOL SUCCINATE ER 25 MG PO TB24
25.0000 mg | ORAL_TABLET | Freq: Every day | ORAL | Status: DC
Start: 2021-07-26 — End: 2021-08-10

## 2021-07-26 MED ORDER — BISACODYL 5 MG PO TBEC
10.0000 mg | DELAYED_RELEASE_TABLET | Freq: Once | ORAL | Status: AC
  Administered 2021-07-27: 10 mg via ORAL
  Filled 2021-07-26: qty 2

## 2021-07-26 MED ORDER — ALUM & MAG HYDROXIDE-SIMETH 200-200-20 MG/5ML PO SUSP: 30.0000 mL | ORAL | Status: DC | PRN

## 2021-07-26 MED ORDER — ENOXAPARIN SODIUM 100 MG/ML IJ SOSY: 1.0000 mg/kg | PREFILLED_SYRINGE | Freq: Two times a day (BID) | INTRAMUSCULAR | Status: DC

## 2021-07-26 MED ORDER — POLYETHYLENE GLYCOL 3350 17 G PO PACK
17.0000 g | PACK | Freq: Every day | ORAL | Status: DC | PRN
  Administered 2021-07-30: 17 g via ORAL
  Filled 2021-07-26 (×2): qty 1

## 2021-07-26 MED ORDER — PROCHLORPERAZINE MALEATE 5 MG PO TABS: 5.0000 mg | ORAL_TABLET | Freq: Four times a day (QID) | ORAL | Status: DC | PRN

## 2021-07-26 MED ORDER — POLYETHYLENE GLYCOL 3350 17 G PO PACK
17.0000 g | PACK | Freq: Every day | ORAL | Status: DC
  Administered 2021-07-27 – 2021-08-10 (×15): 17 g via ORAL
  Filled 2021-07-26 (×15): qty 1

## 2021-07-26 MED ORDER — BISACODYL 10 MG RE SUPP
10.0000 mg | Freq: Every day | RECTAL | Status: DC | PRN
  Administered 2021-07-30: 10 mg via RECTAL
  Filled 2021-07-26: qty 1

## 2021-07-26 MED ORDER — WARFARIN - PHARMACIST DOSING INPATIENT: Freq: Every day | Status: DC

## 2021-07-26 MED ORDER — SODIUM CHLORIDE 0.9 % IV SOLN: 1.0000 g | INTRAVENOUS | Status: DC

## 2021-07-26 MED ORDER — PANTOPRAZOLE SODIUM 40 MG PO TBEC
40.0000 mg | DELAYED_RELEASE_TABLET | Freq: Every day | ORAL | Status: DC
  Administered 2021-07-27 – 2021-08-09 (×14): 40 mg via ORAL
  Filled 2021-07-26 (×14): qty 1

## 2021-07-26 MED ORDER — PANTOPRAZOLE SODIUM 40 MG PO TBEC: 40.0000 mg | DELAYED_RELEASE_TABLET | Freq: Every day | ORAL | Status: DC

## 2021-07-26 MED ORDER — TAMSULOSIN HCL 0.4 MG PO CAPS: 0.4000 mg | ORAL_CAPSULE | Freq: Every day | ORAL | Status: DC

## 2021-07-26 MED ORDER — FLEET ENEMA 7-19 GM/118ML RE ENEM: 1.0000 | ENEMA | Freq: Once | RECTAL | Status: DC | PRN

## 2021-07-26 MED ORDER — DOCUSATE SODIUM 100 MG PO CAPS
100.0000 mg | ORAL_CAPSULE | Freq: Two times a day (BID) | ORAL | Status: DC
  Administered 2021-07-27 – 2021-08-10 (×29): 100 mg via ORAL
  Filled 2021-07-26 (×29): qty 1

## 2021-07-26 NOTE — Discharge Summary (Addendum)
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 Rt groin pseudoaneurysm postprocedure  Echocardiogram revealed LV thrombus.  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 AKI versus CKD  Hyperlipidemia Hypertension Pre - diabetes  Principal Problem:   Acute ischemic left MCA stroke (HCC) Active Problems:   Internal carotid artery occlusion, left   Pressure injury of skin  Past Medical History:  Diagnosis Date   Hypertension    Past Surgical History:  Procedure Laterality Date   IR CT HEAD LTD  07/19/2021   IR FLUORO GUIDED NEEDLE PLC ASPIRATION/INJECTION LOC  07/21/2021   IR PERCUTANEOUS ART THROMBECTOMY/INFUSION INTRACRANIAL INC DIAG ANGIO  07/19/2021   NO PAST SURGERIES     RADIOLOGY WITH ANESTHESIA N/A 07/19/2021   Procedure: IR WITH ANESTHESIA;  Surgeon: Julieanne Cotton, MD;  Location: [facility] OR;  Service: Radiology;  Laterality: N/A;    HOSPITAL MEDICATIONS  atorvastatin  40 mg Oral Daily   Chlorhexidine Gluconate Cloth  6 each Topical Daily   docusate sodium  100 mg Oral BID   enoxaparin (LOVENOX) injection  1 mg/kg Subcutaneous BID   metoprolol succinate  25 mg Oral QHS   pantoprazole  40 mg Oral QHS   polyethylene glycol  17 g Oral Daily   tamsulosin  0.4 mg Oral Daily   warfarin  7.5 mg Oral ONCE-1600   Warfarin - Pharmacist Dosing Inpatient   Does not apply q1600    HOME MEDICATIONS PRIOR TO ADMISSION Medications Prior to Admission  Medication Sig Dispense Refill    enalapril (VASOTEC) 20 MG tablet Take 20 mg by mouth daily.     hydrochlorothiazide (HYDRODIURIL) 12.5 MG tablet Take 12.5 mg by mouth daily.     ibuprofen (ADVIL) 200 MG tablet Take 200 mg by mouth every 6 (six) hours as needed for mild pain.      Allergies as of 07/26/2021   No Known Allergies      Medication List     STOP taking these medications    enalapril 20 MG tablet Commonly known as: VASOTEC   hydrochlorothiazide 12.5 MG tablet Commonly known as: HYDRODIURIL   ibuprofen 200 MG tablet Commonly known as: ADVIL       TAKE these medications    atorvastatin 40 MG tablet Commonly known as: LIPITOR Take 1 tablet (40 mg total) by mouth daily. Start taking on: July 27, 2021   cefTRIAXone 1 g in sodium chloride 0.9 % 100 mL Inject 1 g into the vein daily.   enoxaparin 100 MG/ML injection Commonly known as: LOVENOX Inject 0.9 mLs (90 mg total) into the skin 2 (two) times daily.   metoprolol succinate 25 MG 24 hr tablet Commonly known as: TOPROL-XL Take 1 tablet (25 mg total) by mouth at bedtime.   pantoprazole 40 MG tablet Commonly known as: PROTONIX Take 1 tablet (40 mg total) by mouth at bedtime.   tamsulosin 0.4 MG Caps capsule Commonly known as: FLOMAX Take 1 capsule (0.4 mg total) by mouth daily. Start  taking on: July 27, 2021   warfarin 7.5 MG tablet Commonly known as: COUMADIN Take 1 tablet (7.5 mg total) by mouth one time only at 4 PM.        LABORATORY STUDIES CBC    Component Value Date/Time   WBC 12.4 (H) 07/26/2021 0131   RBC 3.31 (L) 07/26/2021 0131   HGB 10.6 (L) 07/26/2021 0131   HCT 30.8 (L) 07/26/2021 0131   PLT 254 07/26/2021 0131   MCV 93.1 07/26/2021 0131   MCH 32.0 07/26/2021 0131   MCHC 34.4 07/26/2021 0131   RDW 13.8 07/26/2021 0131   LYMPHSABS 0.5 (L) 07/20/2021 0536   MONOABS 0.2 07/20/2021 0536   EOSABS 0.0 07/20/2021 0536   BASOSABS 0.0 07/20/2021 0536   CMP    Component Value Date/Time   NA 132 (L)  07/26/2021 0131   K 3.8 07/26/2021 0131   CL 103 07/26/2021 0131   CO2 21 (L) 07/26/2021 0131   GLUCOSE 105 (H) 07/26/2021 0131   BUN 29 (H) 07/26/2021 0131   CREATININE 1.63 (H) 07/26/2021 0131   CALCIUM 7.7 (L) 07/26/2021 0131   PROT 6.9 07/19/2021 1947   ALBUMIN 3.4 (L) 07/19/2021 1947   AST 23 07/19/2021 1947   ALT 15 07/19/2021 1947   ALKPHOS 54 07/19/2021 1947   BILITOT 1.1 07/19/2021 1947   GFRNONAA 43 (L) 07/26/2021 0131   COAGS Lab Results  Component Value Date   INR 1.7 (H) 07/26/2021   INR 1.3 (H) 07/25/2021   INR 1.2 07/24/2021   Lipid Panel    Component Value Date/Time   CHOL 179 07/20/2021 0536   TRIG 89 07/21/2021 0432   HDL 30 (L) 07/20/2021 0536   CHOLHDL 6.0 07/20/2021 0536   VLDL 17 07/20/2021 0536   LDLCALC 132 (H) 07/20/2021 0536   HgbA1C  Lab Results  Component Value Date   HGBA1C 5.9 (H) 07/20/2021   Urinalysis    Component Value Date/Time   COLORURINE YELLOW 07/23/2021 1259   APPEARANCEUR CLEAR 07/23/2021 1259   LABSPEC 1.017 07/23/2021 1259   PHURINE 6.0 07/23/2021 1259   GLUCOSEU NEGATIVE 07/23/2021 1259   HGBUR SMALL (A) 07/23/2021 1259   BILIRUBINUR NEGATIVE 07/23/2021 1259   KETONESUR NEGATIVE 07/23/2021 1259   PROTEINUR NEGATIVE 07/23/2021 1259   NITRITE NEGATIVE 07/23/2021 1259   LEUKOCYTESUR MODERATE (A) 07/23/2021 1259   Urine Drug Screen     Component Value Date/Time   LABOPIA NONE DETECTED 07/23/2021 1259   COCAINSCRNUR NONE DETECTED 07/23/2021 1259   LABBENZ NONE DETECTED 07/23/2021 1259   AMPHETMU NONE DETECTED 07/23/2021 1259   THCU NONE DETECTED 07/23/2021 1259   LABBARB NONE DETECTED 07/23/2021 1259    Alcohol Level No results found for: ETH   SIGNIFICANT DIAGNOSTIC STUDIES  CT PELVIS WO CONTRAST  Result Date: 07/21/2021 CLINICAL DATA:  History of pseudoaneurysm in the right common femoral artery following recent arterial intervention, status post thrombin injection EXAM: CT OF THE LOWER BILATERAL EXTREMITY  WITHOUT CONTRAST TECHNIQUE: Multidetector CT imaging of the pelvis and bilateral lower extremities was performed according to the standard protocol. COMPARISON:  None. FINDINGS: Bones/Joint/Cartilage Degenerative changes of the lumbar spine are noted. No acute fracture or dislocation is seen. Ligaments Suboptimally assessed by CT. Muscles and Tendons Surrounding musculature appears within normal limits. Soft tissues Vascular calcifications are identified. No aneurysmal dilatation is noted. Rounded soft tissue density is noted just anterior to the could right common femoral artery inferiorly consistent with the known recently treated pseudoaneurysm. Lack of contrast  limits evaluation of any residual patency within the pseudoaneurysm. Mild soft tissue swelling is noted beneath the skin and extending into the proximal and medial right thigh. No discrete hemorrhage is noted. This likely represents previous hemorrhage and underlying bruising related to the manual compression from prior arterial intervention. No rapidly expanding hematoma is noted. Pelvic structures as visualized are within normal limits. Foley catheter is noted within the bladder. IMPRESSION: Mild soft tissue changes in the inguinal region and extending into the medial and anterior thigh on the right consistent with mild bruising from the recent arterial intervention. Rounded soft tissue density is noted anterior to the right common femoral artery consistent with the treated pseudoaneurysm. Lack of IV contrast limits evaluation for residual flow. No other focal abnormality is noted. Electronically Signed   By: Alcide Clever M.D.   On: 07/21/2021 20:21   MR BRAIN WO CONTRAST  Result Date: 07/20/2021 CLINICAL DATA:  Neuro deficit, acute, stroke suspected. Left ICA occlusion. EXAM: MRI HEAD WITHOUT CONTRAST TECHNIQUE: Multiplanar, multiecho pulse sequences of the brain and surrounding structures were obtained without intravenous contrast. COMPARISON:  CT  studies done yesterday. FINDINGS: Brain: Diffusion imaging shows fiber 6 punctate infarctions within the left cerebellum, acute infarction of the left anteromedial hippocampus, and a few scattered infarctions in the posterior frontal and parietal regions on the left. The largest confluent infarction is about 1.5 cm in size and probably affects the precentral gyrus. In the right hemisphere, there are scattered punctate infarctions in the occipital lobe and frontal lobe. No large confluent infarction. No evidence of swelling or acute hemorrhage. There chronic small-vessel ischemic changes of the pons. There are a few older small vessel cerebellar infarctions on the left. There is an old right frontal cortical and subcortical infarction. There is an old left occipital cortical and subcortical infarction with hemosiderin deposition. No evidence of mass, hydrocephalus or extra-axial collection. Vascular: Major vessels at the base of the brain show flow. 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 No large confluent acute infarction. The single largest acute insult is about a cm and a half in size and located in the left posterior frontal region, probably the precentral gyrus. Electronically Signed   By: Paulina Fusi M.D.   On: 07/20/2021 19:28   CT FEMUR LEFT WO CONTRAST  Result Date: 07/21/2021 CLINICAL DATA:  History of pseudoaneurysm in the right common femoral artery following recent arterial intervention, status post thrombin injection EXAM: CT OF THE LOWER BILATERAL EXTREMITY WITHOUT CONTRAST TECHNIQUE: Multidetector CT imaging of the pelvis and bilateral lower extremities was performed according to the standard protocol. COMPARISON:  None. FINDINGS: Bones/Joint/Cartilage Degenerative changes of the lumbar spine are noted. No  acute fracture or dislocation is seen. Ligaments Suboptimally assessed by CT. Muscles and Tendons Surrounding musculature appears within normal limits. Soft tissues Vascular calcifications are identified. No aneurysmal dilatation is noted. Rounded soft tissue density is noted just anterior to the could right common femoral artery inferiorly consistent with the known recently treated pseudoaneurysm. Lack of contrast limits evaluation of any residual patency within the pseudoaneurysm. Mild soft tissue swelling is noted beneath the skin and extending into the proximal and medial right thigh. No discrete hemorrhage is noted. This likely represents previous hemorrhage and underlying bruising related to the manual compression from prior arterial intervention. No rapidly expanding hematoma is noted. Pelvic structures as visualized are within  normal limits. Foley catheter is noted within the bladder. IMPRESSION: Mild soft tissue changes in the inguinal region and extending into the medial and anterior thigh on the right consistent with mild bruising from the recent arterial intervention. Rounded soft tissue density is noted anterior to the right common femoral artery consistent with the treated pseudoaneurysm. Lack of IV contrast limits evaluation for residual flow. No other focal abnormality is noted. Electronically Signed   By: Alcide Clever M.D.   On: 07/21/2021 20:21   CT FEMUR RIGHT WO CONTRAST  Result Date: 07/21/2021 CLINICAL DATA:  History of pseudoaneurysm in the right common femoral artery following recent arterial intervention, status post thrombin injection EXAM: CT OF THE LOWER BILATERAL EXTREMITY WITHOUT CONTRAST TECHNIQUE: Multidetector CT imaging of the pelvis and bilateral lower extremities was performed according to the standard protocol. COMPARISON:  None. FINDINGS: Bones/Joint/Cartilage Degenerative changes of the lumbar spine are noted. No acute fracture or dislocation is seen. Ligaments Suboptimally  assessed by CT. Muscles and Tendons Surrounding musculature appears within normal limits. Soft tissues Vascular calcifications are identified. No aneurysmal dilatation is noted. Rounded soft tissue density is noted just anterior to the could right common femoral artery inferiorly consistent with the known recently treated pseudoaneurysm. Lack of contrast limits evaluation of any residual patency within the pseudoaneurysm. Mild soft tissue swelling is noted beneath the skin and extending into the proximal and medial right thigh. No discrete hemorrhage is noted. This likely represents previous hemorrhage and underlying bruising related to the manual compression from prior arterial intervention. No rapidly expanding hematoma is noted. Pelvic structures as visualized are within normal limits. Foley catheter is noted within the bladder. IMPRESSION: Mild soft tissue changes in the inguinal region and extending into the medial and anterior thigh on the right consistent with mild bruising from the recent arterial intervention. Rounded soft tissue density is noted anterior to the right common femoral artery consistent with the treated pseudoaneurysm. Lack of IV contrast limits evaluation for residual flow. No other focal abnormality is noted. Electronically Signed   By: Alcide Clever M.D.   On: 07/21/2021 20:21   IR CT Head Ltd  Result Date: 07/21/2021 INDICATION: Acute onset of right-sided weakness, left gaze deviation, and aphasia. 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 EXAM: 1. EMERGENT LARGE VESSEL OCCLUSION THROMBOLYSIS (anterior COMPARISON:  CT angiogram of the head and neck of July 19, 2021. MEDICATIONS: Ancef 2 g IV antibiotic was administered within 1 hour of the procedure. ANESTHESIA/SEDATION: General anesthesia. CONTRAST:  Omnipaque 240 60 mL. FLUOROSCOPY TIME:  Fluoroscopy Time: 38 minutes 18 seconds (1896 mGy).  COMPLICATIONS: None immediate. TECHNIQUE: Following a full explanation of the procedure along with the potential associated complications, an informed witnessed consent was obtained. The risks of intracranial hemorrhage of 10%, worsening neurological deficit, ventilator dependency, death and inability to revascularize were all reviewed in detail with the patient's daughter. The patient was then put under general anesthesia by the Department of Anesthesiology at [facility]. The right groin was prepped and draped in the usual sterile fashion. Thereafter using modified Seldinger technique, transfemoral access into the right common femoral artery was obtained without difficulty. Over a 0.035 inch guidewire an 8 [place] Pinnacle 25 cm sheath was inserted. Through this, and also over a 0.035 inch guidewire a 5 Simmons 2 catheter was advanced to the aortic arch region and selectively positioned in left common carotid artery. FINDINGS: The left common carotid  arteriogram demonstrates opacification of the left external carotid artery and its major branches to be grossly unremarkable. The left internal carotid artery at the bulb and just distally demonstrates stasis of contrast. No evidence of a more distal angiographic string sign. No distal reconstitution of the left ICA noted from the external carotid artery branches via the ophthalmic artery. PROCEDURE: The Simmons 2 diagnostic catheter was then exchanged over a 0.035 inch 300 cm Rosen exchange guidewire for an 087 balloon guide catheter which was positioned in the left internal carotid artery bulb region. Gentle control arteriogram performed through the balloon guide catheter demonstrated continuous stasis of contrast in the left internal carotid artery at the bulb and just distal to this. Over a 0.014 inch standard Synchro micro guidewire with a J-tip configuration, an 021 162 cm microcatheter inside of an 132 cm 71 Zoom aspiration catheter combination was  advanced to the supraclinoid left ICA and then the left middle cerebral artery M2 M3 region followed by the microcatheter. The guidewire was removed. Good aspiration obtained from the hub of the microcatheter. A gentle control arteriogram performed through the microcatheter demonstrated safe position of the tip of the microcatheter with slow antegrade clearance of contrast. This was then connected to continuous heparinized saline infusion. A 6 mm x 40 mm Solitaire X retrieval device was then advanced to the distal end of the microcatheter and deployed in the usual manner such that the proximal portion was in the supraclinoid left ICA. The Zoom aspiration catheter was then advanced into the left middle cerebral artery proximally. Thereafter with proximal flow arrest in the left internal carotid artery, and constant aspiration applied at the hub of the balloon guide catheter with a 20 mL syringe, and at the hub of the Zoom aspiration catheter with Penumbra aspiration apparatus for approximately 2-1/2 minutes, the combination of the retrieval device, microcatheter, and the Zoom aspiration catheter were retrieved and removed. Following reversal of flow arrest, a control arteriogram performed through the balloon guide catheter in the proximal left ICA demonstrated revascularization of the left internal carotid artery proximally and distally. The left middle cerebral artery demonstrated wide patency achieving a TICI 3 revascularization. Also noted was opacification of the left anterior cerebral artery into the capillary and venous phases. Cross filling via the anterior communicating artery of the right anterior cerebral A2 segment, and distal right A1 segment was noted. The balloon guide catheter was then retrieved into the left common carotid artery. A control arteriogram performed from this site demonstrated wide patency of the left internal carotid artery proximally and distally with no change in the complete  revascularization of the left middle cerebral distribution. Balloon guide was then removed. The 8 French Pinnacle sheath was removed with manual compression held for 30 minutes with a quick clot for hemostasis. Distal pulses remained Dopplerable in both feet unchanged. A CT of the brain demonstrated no evidence of intracranial hemorrhage or mass effect. Patient was left intubated due to difficulty with communication due to language barrier prior to the procedure, and awaiting the results of the COVID-19 test. Patient was then transferred to the neuro ICU for post thrombectomy management. IMPRESSION: Status post endovascular complete revascularization of occluded left internal carotid artery proximally and distally to the terminus and the origin of the left middle cerebral artery with 1 pass with a 6 mm x 40 mm Solitaire X retrieval device, and contact aspiration achieving a TICI 3 revascularization. PLAN: Follow-up as per referring MD. Electronically Signed   By: Simonne Maffucci  Deveshwar M.D.   On: 07/20/2021 09:43   IR Fluoro Guide Ndl Plmt / BX  Result Date: 07/21/2021 INDICATION: 80 year old male, status post mechanical thrombectomy for emergent large vessel occlusion, now with symptomatic right common femoral artery pseudoaneurysm EXAM: ULTRASOUND-GUIDED TREATMENT OF RIGHT COMMON FEMORAL ARTERY PSEUDOANEURYSM WITH THROMBIN INJECTION MEDICATIONS: 5000 units reconstituted bovine thrombin ANESTHESIA/SEDATION: The patient's level of consciousness and vital signs were monitored continuously by radiology nursing throughout the procedure under my direct supervision. FLUOROSCOPY TIME:  NONE COMPLICATIONS: NONE PROCEDURE: Informed written consent was obtained from the patient by way of Spanish interpreter, after a thorough discussion of the procedural risks, benefits and alternatives. All questions were addressed. Maximal Sterile Barrier Technique was utilized including caps, mask, sterile gowns, sterile gloves, sterile  drape, hand hygiene and skin antiseptic. A timeout was performed prior to the initiation of the procedure. Patient was positioned supine position on the stretcher. The right hip was prepped and draped in the usual sterile fashion. 1% lidocaine was used for local anesthesia. 5000 units of bovine thrombin was reconstituted. Using ultrasound guidance, a 22 gauge needle was advanced into the dome of the pseudoaneurysm of the right common femoral artery. Under real-time ultrasound, small aliquots of thrombin were infused into the superficial aspect of the pseudoaneurysm until there was no flow detected. Needle was removed. We confirmed that the right anterior tibial artery and posterior tibial artery pulses remained palpable. Sterile dressing was placed. Patient tolerated the procedure well and remained hemodynamically stable throughout. No complications were encountered and no significant blood loss. FINDINGS: The initial ultrasound images demonstrates a diameter of greater than 3 cm, indicating that the pseudoaneurysm head grown since the comparison. No flow detected at the completion, with heterogeneously hyperechoic echotexture throughout at the conclusion. IMPRESSION: Status post ultrasound-guided thrombin injection for treatment of symptomatic right common femoral artery pseudoaneurysm. Signed, Yvone Neu. Reyne Dumas, RPVI [facility] Electronically Signed   By: Gilmer Mor D.O.   On: 07/21/2021 16:48   DG CHEST PORT 1 VIEW  Result Date: 07/21/2021 CLINICAL DATA:  CHF EXAM: PORTABLE CHEST 1 VIEW COMPARISON:  None. FINDINGS: Enlarged cardiomediastinal silhouette. Low lung volumes with bibasilar subsegmental atelectasis. Mild lower lung predominant interstitial opacities. Small bilateral pleural effusions. No visible pneumothorax. Bilateral shoulder degenerative changes. No acute osseous abnormality. IMPRESSION: Cardiomegaly with interstitial pulmonary  edema and small bilateral pleural effusions. Low lung volumes. Electronically Signed   By: Caprice Renshaw M.D.   On: 07/21/2021 13:59   DG CHEST PORT 1 VIEW  Result Date: 07/20/2021 CLINICAL DATA:  Check gastric catheter placement EXAM: PORTABLE CHEST 1 VIEW COMPARISON:  None. FINDINGS: Endotracheal tube is noted in satisfactory position. Cardiac shadow is enlarged. Poor inspiratory effort is noted with bibasilar atelectasis. Gastric catheter is seen within the stomach. No bony abnormality is noted. IMPRESSION: Tubes and lines in satisfactory position. Mild bibasilar atelectatic changes are seen. Electronically Signed   By: Alcide Clever M.D.   On: 07/20/2021 00:29   VAS [place] GROIN PSEUDOANEURYSM  Result Date: 07/22/2021  ARTERIAL PSEUDOANEURYSM  Patient Name:  Roberto Dickson  Date of Exam:   07/22/2021 Medical Rec #: [id number]          Accession #:    [id number] Date of Birth: May 24, 1941          Patient Gender: M Patient Age:   80 years Exam Location:  [facility] Procedure:      VAS [place] Bobetta Lime Referring Phys: Lannette Donath MATTHEWS --------------------------------------------------------------------------------  Exam: Right groin Indications: Patient complains of s/p right pseudoaneurysm thrombin injection. History: S/p catheterization. Comparison Study: 07/21/2021- pseudoaneurysm evaluation Performing Technologist: Gertie FeyMichelle Simonetti MHA, RDMS, RVT, RDCS  Examination Guidelines: A complete evaluation includes B-mode imaging, spectral Doppler, color Doppler, and power Doppler as needed of all accessible portions of each vessel. Bilateral testing is considered an integral part of a complete examination. Limited examinations for reoccurring indications may be performed as noted. +------------+----------+---------+------+----------+ Right DuplexPSV (cm/s)Waveform PlaqueComment(s) +------------+----------+---------+------+----------+ CFA             72    triphasic                  +------------+----------+---------+------+----------+ PFA             33    biphasic                  +------------+----------+---------+------+----------+ Prox SFA       138    triphasic                 +------------+----------+---------+------+----------+ Right Vein comments:patent CFV  Findings: A mixed echogenic structure measuring approximately 2.3 cm x 3.0 cm is visualized at the right groin with ultrasound characteristics of a hematoma.  Diagnosing physician: Waverly Ferrarihristopher Dickson MD Electronically signed by Waverly Ferrarihristopher Dickson MD on 07/22/2021 at 2:32:44 PM.   --------------------------------------------------------------------------------    Final    VAS US GROIN PSEUDOANEURYSM  Result Date: 07/21/2021  ARTERIAL PSEUDOANEURYSM  Patient Name:  Rito EhrlichCARLOS A Cooprider  Date of Exam:   07/21/2021 Medical Rec #: [id number]          Accession #:    [id number] Date of Birth: [birth year]          Patient Gender: M Patient Age:   [age] years [facility]am [facility] Procedure:      VAS US Bobetta LimeGROIN PSEUDOANEURYSM Referring Phys: Lannette DonathKACIE MATTHEWS --------------------------------------------------------------------------------  Exam: Right groin Indications: Patient complains of groin pain and bruising. History: Status post mechanical thrombectomy and revascularization of occluded Lt ICA extracranially and intracranially and Lt Lt MCA at origin. Comparison Study: No prior study Performing Technologist: Sherren Kernsandace Kanady RVS  Examination Guidelines: A complete evaluation includes B-mode imaging, spectral Doppler, color Doppler, and power Doppler as needed of all accessible portions of each vessel. Bilateral testing is considered an integral part of a complete examination. Limited examinations for reoccurring indications may be performed as noted.  Findings: An area with well defined borders measuring 2.1 cm x 2.7 cm was visualized arising off of the Common femoral artery with ultrasound characteristics of a  partially thrombosed pseudoaneurysm. The neck measures approximately 0.9 cm wide and 1.3 cm long.  Diagnosing physician: Lemar LivingsBrandon Cain MD Electronically signed by Lemar LivingsBrandon Cain MD on 07/21/2021 at 1:34:48 PM.   --------------------------------------------------------------------------------    Final    ECHOCARDIOGRAM COMPLETE  Result Date: 07/20/2021    ECHOCARDIOGRAM REPORT   Patient Name:   Rito EhrlichCARLOS A Chamorro Date of Exam: 07/20/2021 Medical Rec #:  [id number]         Height: Accession #:    [id number]        Weight:       199.5 lb Date of Birth:  [birth year]         BSA:          2.016 m Patient Age:    80 years          BP:           106/79 mmHg Patient Gender: M  HR:           83 bpm. Exam Location:  Inpatient Procedure: 2D Echo, Cardiac Doppler, Color Doppler and Intracardiac            Opacification Agent Indications:    Stroke  History:        Patient has no prior history of Echocardiogram examinations.  Sonographer:    Roosvelt Maser RDCS Referring Phys: [id number] [facility]  Sonographer Comments: Global longitudinal strain was attempted. IMPRESSIONS  1. 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 Apical, apical anterior, and apical septal akinesis. Inferoseptal hypokinesis. Left ventricular ejection fraction, by estimation, is 50 to 55%. The left ventricle has low normal function. The left ventricle demonstrates regional wall motion abnormalities (see scoring diagram/findings for description). Left ventricular diastolic parameters are consistent with Grade I diastolic dysfunction (impaired relaxation).  2. Right ventricular systolic function is normal. The right ventricular size is normal.  3. The mitral valve is normal in structure. Trivial mitral valve regurgitation. No evidence of mitral stenosis.  4. The aortic valve is normal in structure. Aortic valve regurgitation is not visualized. No aortic stenosis is present.  5. The inferior vena cava is normal in size with greater than 50%  respiratory variability, suggesting right atrial pressure of 3 mmHg. FINDINGS  Left Ventricle: Apical thrombus measuring 2.17 cm x 1.67 cm. Apical, apical anterior, and apical septal akinesis. Inferoseptal hypokinesis. Left ventricular ejection fraction, by estimation, is 50 to 55%. The left ventricle has low normal function. The left ventricle demonstrates regional wall motion abnormalities. Definity contrast agent was given IV to delineate the left ventricular endocardial borders. The left ventricular internal cavity size was normal in size. There is no left ventricular hypertrophy. Left ventricular diastolic parameters are consistent with Grade I diastolic dysfunction (impaired relaxation). Normal left ventricular filling pressure. Right Ventricle: The right ventricular size is normal. No increase in right ventricular wall thickness. Right ventricular systolic function is normal. Left Atrium: Left atrial size was normal in size. Right Atrium: Right atrial size was normal in size. Pericardium: There is no evidence of pericardial effusion. Mitral Valve: The mitral valve is normal in structure. Trivial mitral valve regurgitation. No evidence of mitral valve stenosis. Tricuspid Valve: The tricuspid valve is normal in structure. Tricuspid valve regurgitation is not demonstrated. No evidence of tricuspid stenosis. Aortic Valve: The aortic valve is normal in structure. Aortic valve regurgitation is not visualized. No aortic stenosis is present. Pulmonic Valve: The pulmonic valve was normal in structure. Pulmonic valve regurgitation is not visualized. No evidence of pulmonic stenosis. Aorta: The aortic root is normal in size and structure. Venous: The inferior vena cava is normal in size with greater than 50% respiratory variability, suggesting right atrial pressure of 3 mmHg. IAS/Shunts: No atrial level shunt detected by color flow Doppler.   LV Volumes (MOD) LV vol s, MOD A4C: 76.1 ml Diastology                             LV e' medial:    3.15 cm/s                            LV E/e' medial:  9.5                            LV e' lateral:   6.31 cm/s  LV E/e' lateral: 4.8  LEFT ATRIUM           Index LA Vol (A4C): 44.3 ml 21.97 ml/m  MITRAL VALVE MV Area (PHT): 4.19 cm MV Decel Time: 181 msec MV E velocity: 30.00 cm/s MV A velocity: 75.40 cm/s MV E/A ratio:  0.40 Chilton Si MD Electronically signed by Chilton Si MD Signature Date/Time: 07/20/2021/4:46:12 PM    Final    IR PERCUTANEOUS ART THROMBECTOMY/INFUSION INTRACRANIAL INC DIAG ANGIO  Result Date: 07/21/2021 INDICATION: Acute onset of right-sided weakness, left gaze deviation, and aphasia. 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 EXAM: 1. EMERGENT LARGE VESSEL OCCLUSION THROMBOLYSIS (anterior COMPARISON:  CT angiogram of the head and neck of July 19, 2021. MEDICATIONS: Ancef 2 g IV antibiotic was administered within 1 hour of the procedure. ANESTHESIA/SEDATION: General anesthesia. CONTRAST:  Omnipaque 240 60 mL. FLUOROSCOPY TIME:  Fluoroscopy Time: 38 minutes 18 seconds (1896 mGy). COMPLICATIONS: None immediate. TECHNIQUE: Following a full explanation of the procedure along with the potential associated complications, an informed witnessed consent was obtained. The risks of intracranial hemorrhage of 10%, worsening neurological deficit, ventilator dependency, death and inability to revascularize were all reviewed in detail with the patient's daughter. The patient was then put under general anesthesia by the Department of Anesthesiology at [facility]. The right groin was prepped and draped in the usual sterile fashion. Thereafter using modified Seldinger technique, transfemoral access into the right common femoral artery was obtained without difficulty. Over a 0.035 inch guidewire an 8 [place] Pinnacle 25 cm sheath was inserted. Through  this, and also over a 0.035 inch guidewire a 5 Simmons 2 catheter was advanced to the aortic arch region and selectively positioned in left common carotid artery. FINDINGS: The left common carotid arteriogram demonstrates opacification of the left external carotid artery and its major branches to be grossly unremarkable. The left internal carotid artery at the bulb and just distally demonstrates stasis of contrast. No evidence of a more distal angiographic string sign. No distal reconstitution of the left ICA noted from the external carotid artery branches via the ophthalmic artery. PROCEDURE: The Simmons 2 diagnostic catheter was then exchanged over a 0.035 inch 300 cm Rosen exchange guidewire for an 087 balloon guide catheter which was positioned in the left internal carotid artery bulb region. Gentle control arteriogram performed through the balloon guide catheter demonstrated continuous stasis of contrast in the left internal carotid artery at the bulb and just distal to this. Over a 0.014 inch standard Synchro micro guidewire with a J-tip configuration, an 021 162 cm microcatheter inside of an 132 cm 71 Zoom aspiration catheter combination was advanced to the supraclinoid left ICA and then the left middle cerebral artery M2 M3 region followed by the microcatheter. The guidewire was removed. Good aspiration obtained from the hub of the microcatheter. A gentle control arteriogram performed through the microcatheter demonstrated safe position of the tip of the microcatheter with slow antegrade clearance of contrast. This was then connected to continuous heparinized saline infusion. A 6 mm x 40 mm Solitaire X retrieval device was then advanced to the distal end of the microcatheter and deployed in the usual manner such that the proximal portion was in the supraclinoid left ICA. The Zoom aspiration catheter was then advanced into the left middle cerebral artery proximally. Thereafter with proximal flow arrest in the  left internal carotid artery, and constant aspiration applied at the hub of  the balloon guide catheter with a 20 mL syringe, and at the hub of the Zoom aspiration catheter with Penumbra aspiration apparatus for approximately 2-1/2 minutes, the combination of the retrieval device, microcatheter, and the Zoom aspiration catheter were retrieved and removed. Following reversal of flow arrest, a control arteriogram performed through the balloon guide catheter in the proximal left ICA demonstrated revascularization of the left internal carotid artery proximally and distally. The left middle cerebral artery demonstrated wide patency achieving a TICI 3 revascularization. Also noted was opacification of the left anterior cerebral artery into the capillary and venous phases. Cross filling via the anterior communicating artery of the right anterior cerebral A2 segment, and distal right A1 segment was noted. The balloon guide catheter was then retrieved into the left common carotid artery. A control arteriogram performed from this site demonstrated wide patency of the left internal carotid artery proximally and distally with no change in the complete revascularization of the left middle cerebral distribution. Balloon guide was then removed. The 8 French Pinnacle sheath was removed with manual compression held for 30 minutes with a quick clot for hemostasis. Distal pulses remained Dopplerable in both feet unchanged. A CT of the brain demonstrated no evidence of intracranial hemorrhage or mass effect. Patient was left intubated due to difficulty with communication due to language barrier prior to the procedure, and awaiting the results of the COVID-19 test. Patient was then transferred to the neuro ICU for post thrombectomy management. IMPRESSION: Status post endovascular complete revascularization of occluded left internal carotid artery proximally and distally to the terminus and the origin of the left middle cerebral artery  with 1 pass with a 6 mm x 40 mm Solitaire X retrieval device, and contact aspiration achieving a TICI 3 revascularization. PLAN: Follow-up as per referring MD. Electronically Signed   By: Julieanne Cotton M.D.   On: 07/20/2021 09:43   CT HEAD CODE STROKE WO CONTRAST  Result Date: 07/19/2021 CLINICAL DATA:  Code stroke.  Acute neurologic deficit EXAM: CT HEAD WITHOUT CONTRAST TECHNIQUE: Contiguous axial images were obtained from the base of the skull through the vertex without intravenous contrast. COMPARISON:  None. FINDINGS: Brain: There is no mass, hemorrhage or extra-axial collection. The size and configuration of the ventricles and extra-axial CSF spaces are normal. 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 Vascular: No abnormal hyperdensity of the major intracranial arteries or dural venous sinuses. No intracranial atherosclerosis. Skull: The visualized skull base, calvarium and extracranial soft tissues are normal. Sinuses/Orbits: No fluid levels or advanced mucosal thickening of the visualized paranasal sinuses. No mastoid or middle ear effusion. The orbits are normal. ASPECTS [facility] Stroke Program Early CT Score) - Ganglionic level infarction (caudate, lentiform nuclei, internal capsule, insula, M1-M3 cortex): 7 - Supraganglionic infarction (M4-M6 cortex): 3 Total score (0-10 with 10 being normal): 10 IMPRESSION: 1. No acute intracranial abnormality. 2. 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 3. ASPECTS is 10. 4. These results were called by telephone at the time of interpretation on 07/19/2021 at 8:00 pm to provider SAL [facility] , who verbally acknowledged these results. Electronically Signed   By: Deatra Robinson M.D.   On: 07/19/2021 20:01   CT ANGIO HEAD NECK W WO CM (CODE STROKE)  Result Date: 07/19/2021 CLINICAL DATA:  Left MCA symptoms EXAM: CT ANGIOGRAPHY HEAD AND NECK TECHNIQUE: Multidetector CT imaging of the head and neck was performed using the standard protocol during bolus administration of  intravenous contrast. Multiplanar CT image reconstructions and MIPs were obtained to evaluate the vascular anatomy.  Carotid stenosis measurements (when applicable) are obtained utilizing NASCET criteria, using the distal internal carotid diameter as the denominator. CONTRAST:  OMNIPAQUE IOHEXOL 350 MG/ML SOLN COMPARISON:  None. FINDINGS: CTA NECK FINDINGS SKELETON: There is no bony spinal canal stenosis. No lytic or blastic lesion. OTHER NECK: Normal pharynx, larynx and major salivary glands. No cervical lymphadenopathy. Unremarkable thyroid gland. UPPER CHEST: No pneumothorax or pleural effusion. No nodules or masses. AORTIC ARCH: There is no calcific atherosclerosis of the aortic arch. There is no aneurysm, dissection or hemodynamically significant stenosis of the visualized portion of the aorta. Conventional 3 vessel aortic branching pattern. The visualized proximal subclavian arteries are widely patent. RIGHT CAROTID SYSTEM: Normal without aneurysm, dissection or stenosis. LEFT CAROTID SYSTEM: The left ICA is occluded at its origin and remains occluded to the carotid terminus. VERTEBRAL ARTERIES: Left dominant configuration. Both origins are clearly patent. There is no dissection, occlusion or flow-limiting stenosis to the skull base (V1-V3 segments). CTA HEAD FINDINGS POSTERIOR CIRCULATION: --Vertebral arteries: Normal V4 segments. --Inferior cerebellar arteries: Normal. --Basilar artery: Normal. --Superior cerebellar arteries: Normal. --Posterior cerebral arteries (PCA): Normal. ANTERIOR CIRCULATION: --Intracranial internal carotid arteries: Normal. --Anterior cerebral arteries (ACA): Normal. Both A1 segments are present. Patent anterior communicating artery (a-comm). --Middle cerebral arteries (MCA): Right MCA is normal. The left MCA is opacified via collateral flow across the anterior communicating artery. VENOUS SINUSES: As permitted by contrast timing, patent. ANATOMIC VARIANTS: None Review of the  MIP images confirms the above findings. IMPRESSION: 1. No intracranial arterial occlusion or high-grade stenosis. 2. 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 The left MCA is opacified via collateral flow across the anterior communicating artery. Aortic Atherosclerosis (ICD10-I70.0). Electronically Signed   By: Deatra Robinson M.D.   On: 07/19/2021 20:21       HISTORY OF PRESENT ILLNESS (From Dr Tollie Eth H&P 07/19/21) Axel Frisk is a 80 y.o. male with PMH significant for HTN, has not seen a PCP in several years who went to bed around 1700 to take a nap and woke up at 1830 and could not get out of bed and was not talking, was able to follow some commands for her. Wife called daughter who called EMS. Was noted by EMS to have L gaze deviation, R facial droop and RUE weakness and aphasia. He was brought in as a stroke code. Last saw his PCP maybe 4+ years ago. Has HTN but no other illness. Does not eat vegetables, no smoking. No hx of bleeding in his head or elsewhere, no recent MI, no recent trauma or surgery, not on any anticoagulation. Glucose per EMS was 103, here initial reading was 23, repeat reading was 76. I suspect that the low initial value was probably an error. mRS: 0 LKW: 1700 on 07/19/21 tPA: yes given. Risks and benefits discussed with patient's wife and daughter over the phone including 33% chance of improvement and about 3% risk of ICH. Thrombectomy: Noted to have L ICA occlusion at origin upto terminus with distal reconsitution. Dr. Corliss Skains discussed risks and benefits with mother and daughter over phone and I witnessed the consent process. They did not have any questions for Dr. Corliss Skains. Family consented for thrombectomy.  HOSPITAL COURSE Mr. ALPHONSO GREGSON is a 80 y.o. 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 CTA Head and Neck showed LICA occlusion, no significant atherosclerosis. MRI Brain showed left cerebellum, right MCA/PCA, MCA/ACA, left MCA/PCA, MCA and ACA scattered infarcts. -Echocardiogram EF 50 to 55% with LV thrombus.  -LDL 132 and A1C 5.9.  UDS negative -On coumadin and Lovenox. INR 1.3. 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  - Long term blood pressure goal normotensive - On metoprolol 25   Rt groin pseudoaneurysm postprocedure  -Vascular surgery consulted -s/p thrombin injection 07/21/21  -Repeat ultrasound showed pseudoaneurysm resolution   Hyperlipidemia  LDL is 132, Atorvastatin 40 mg started this admission.  Continue on discharge   AKI versus CKD  Cr elevated at 1.54, unclear what baseline Cr is.   Continue to trend, encourage PO fluids   UTI Afebrile. No infectious processes at this  CBC 15.1 T-max 99.6 UA 8/18 WBC >50, 8/20 UA 21-50 Rocephin day 3/5 U cx neg  RN Pressure Injury Documentation: Pressure Injury 07/22/21 Penis Stage 2 -  Partial thickness loss of dermis presenting as a shallow open injury with a red, pink wound bed without slough. (Active)  07/22/21 1930  Location: Penis  Location Orientation:   Staging: Stage 2 -  Partial thickness loss of dermis presenting as a shallow open injury with a red, pink wound bed without slough.  Wound Description (Comments):   Present on Admission:       DISCHARGE EXAM Vitals:   07/25/21 1434 07/25/21 2007 07/25/21 2333 07/26/21 0414  BP: 138/61 140/80 122/70 107/82   Pulse: 100     Resp: Temp: 98.3 F (36.8 C) 98.3 F (36.8 C) 98.3 F (36.8 C) 99.1 F (37.3 C)  TempSrc: Oral Oral Oral Oral  SpO2: 97% 97%    Weight:       GENERAL: Awake, alert in NAD HEENT: - Normocephalic and atraumatic, dry mm LUNGS - Clear to auscultation bilaterally with no wheezes CV - S1S2 RRR, no m/r/g, equal pulses bilaterally. ABDOMEN - Soft, nontender, nondistended with normoactive BS Ext: bilateral inner thigh bruising, no size enlargement from markings, soft older in nature more yelloeish, purple discoloration. warm, well perfused, intact peripheral pulses, no edema NEURO:  Mental Status: AA&Ox3, follows commands  Language: speech is clear, no dysarthria.  Naming, repetition, fluency, and comprehension intact. Cranial Nerves: PERRL 3 mm/brisk. EOMI, visual fields full, slight right facial asymmetry, facial sensation intact, hearing intact, tongue/uvula/soft palate midline, normal sternocleidomastoid and trapezius muscle strength. No evidence of tongue atrophy or fibrillations Motor: RUE and RLE 4/5 and weak right grip 1/5. Left Upper and Lower 5/5.  Tone: is normal and bulk is normal Sensation- Intact to light touch bilaterally Coordination: FTN intact bilaterally, no ataxia in BLE. Gait- deferred  Discharge Diet   Diet Order             Diet Heart Room service appropriate? Yes; Fluid consistency: Thin  Diet effective now                  liquids  DISCHARGE PLAN Disposition:  Discharge to [facility] for ongoing PT, OT and ST  Lovenox transitioning to warfarin for secondary stroke prevention. Recommend ongoing risk factor control by Primary Care Physician at time of discharge from inpatient rehabilitation. Follow-up Pcp, No in 2 weeks following discharge from rehab. Follow-up in [facility] Dr Pearlean Brownie in 4 weeks following discharge from rehab, office to schedule an appointment.  Follow-up Dr  Corliss Skains as instructed Follow-up Cardiology as instructed  35 minutes were spent preparing discharge.  Delton See PA-C [facility] Pager [phone] 07/26/2021, 3:24 PM  ATTENDING NOTE: I reviewed above note and agree with the assessment and plan. Pt was seen and examined.   No acute event overnight, no significant complaints.  Bilateral thigh bruise and soft tissue hematoma stable per RN.  INR 1.7 today, continue Lovenox bridge to Coumadin.  Ready for CIR discharge.  Follow-up with [facility] in 4 weeks.  For detailed assessment and plan, please refer to above as I have made changes wherever appropriate.   Marvel Plan, MD PhD Stroke Neurology 07/27/2021 12:06 AM

## 2021-07-26 NOTE — Progress Notes (Signed)
PMR Admission Coordinator Pre-Admission Assessment   Patient: Roberto Dickson is an 80 y.o., male MRN: [id number] DOB: Oct 29, 1941 Height:   Weight: 90.5 kg   Insurance Information HMO:       PPO:      PCP:      IPA:      80/20:   yes   OTHER:  PRIMARY:   Medicare Part A and B     Policy#: [id number]      Subscriber: Pt. Phone#: Verified online    Fax#:  Pre-Cert#:       Employer:  Benefits:  Phone #:      Name:  Eff. Date: Parts A  effective 1/1/22009 and B effective 06/03/2020  Deduct: $1556      Out of Pocket Max:  None      Life Max: N/A  CIR: 100%      SNF: 100 days Outpatient: 80%     Co-Pay: 20% Home Health: 100%      Co-Pay: none DME: 80%     Co-Pay: 20% Providers: patient's choice   SECONDARY: Medicaid of [place]                Policy#: [id number] N     Phone#: [phone] N   Financial Counselor: none       Phone#:    The "Data Collection Information Summary" for patients in Inpatient Rehabilitation Facilities with attached "Privacy Act [place] Records" was provided and verbally reviewed with: Patient   Emergency Contact Information Contact Information       Name Relation Home Work Mobile    Roberto Dickson Daughter     [phone]    Roberto Dickson     [phone]           Current Medical History  Patient Admitting Diagnosis: CVA History of Present Illness: .Roberto Dickson was admitted 07/19/21 for stroke. He woke up from a nap and could not get out of bed and was unable to talk. Wife and daughter called EMS. EMS noted L gaze deviation, R facial droop RUE weakness and aphasia. He was brought in as CODE STROKE. He was administered TPA. 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 HE was admitted to ICU for further work-up. He was extubated 8/17 to 3L [place].  Course complicated by bilateral groin hematomas. CIR was consulted to assist in return to PLOF.  Complete NIHSS TOTAL: 0   Patient's medical record from [facility]   has been reviewed by the rehabilitation admission coordinator and physician.   Past Medical History      Past Medical History:  Diagnosis Date   Hypertension        Has the patient had major surgery during 100 days prior to admission? Yes   Family History   family history is not on file.   Current Medications   Current Facility-Administered Medications:    0.9 %  sodium chloride infusion, 250 mL, Intravenous, Continuous, Deland Pretty, MD   acetaminophen (TYLENOL) tablet 650 mg, 650 mg, Oral, Q4H PRN, 650 mg at 07/26/21 0901 **OR** acetaminophen (TYLENOL) 160 MG/5ML solution 650 mg, 650 mg, Per Tube, Q4H PRN **OR** acetaminophen (TYLENOL) suppository 650 mg, 650 mg, Rectal, Q4H PRN, Deveshwar, Sanjeev, MD   atorvastatin (LIPITOR) tablet 40 mg, 40 mg, Oral, Daily, Agarwala, Ravi, MD, 40 mg at 07/26/21 0901   cefTRIAXone (ROCEPHIN) 1 g in sodium chloride 0.9 % 100 mL IVPB, 1 g, Intravenous, Q24H, Rosalin Hawking, MD, Last  Rate: 200 mL/hr at 07/25/21 1450, 1 g at 07/25/21 1450   Chlorhexidine Gluconate Cloth 2 % PADS 6 each, 6 each, Topical, Daily, Donnetta Simpers, MD, 6 each at 07/26/21 0901   docusate sodium (COLACE) capsule 100 mg, 100 mg, Oral, BID, Donnamae Jude, RPH, 100 mg at 07/26/21 0901   enoxaparin (LOVENOX) injection 90 mg, 1 mg/kg, Subcutaneous, BID, Mancheril, Darnell Level, RPH, 90 mg at 07/26/21 0901   metoprolol succinate (TOPROL-XL) 24 hr tablet 25 mg, 25 mg, Oral, QHS, Buford Dresser, MD, 25 mg at 07/25/21 2147   pantoprazole (PROTONIX) EC tablet 40 mg, 40 mg, Oral, QHS, Leonie Man, Pramod S, MD, 40 mg at 07/25/21 2148   polyethylene glycol (MIRALAX / GLYCOLAX) packet 17 g, 17 g, Oral, Daily, Donnetta Simpers, MD, 17 g at 07/26/21 0901   senna-docusate (Senokot-S) tablet 1 tablet, 1 tablet, Oral, QHS PRN, Donnetta Simpers, MD, 1 tablet at 07/25/21 1853   tamsulosin [facility]) capsule 0.4 mg, 0.4 mg, Oral, Daily, Donnetta Simpers, MD, 0.4 mg at 07/26/21 0901    thrombin spray, , , PRN, Corrie Mckusick, DO, 5,000 Units at 07/21/21 1510   warfarin (COUMADIN) tablet 7.5 mg, 7.5 mg, Oral, ONCE-1600, Rozann Lesches, [facility]   Warfarin - Pharmacist Dosing Inpatient, , Does not apply, q1600, von Verdene Rio B, RPH   Patients Current Diet:  Diet Order                  Diet Heart Room service appropriate? Yes; Fluid consistency: Thin  Diet effective now                         Precautions / Restrictions Precautions Precautions: Fall Precaution Comments: watch BP, interpreter [phone] Roberto Dickson) and then [phone] Roberto Dickson). Restrictions Weight Bearing Restrictions: No    Has the patient had 2 or more falls or a fall with injury in the past year? No   Prior Activity Level Limited Community (1-2x/wk): Pt went out about once a week   Prior Functional Level Self Care: Did the patient need help bathing, dressing, using the toilet or eating? Independent   Indoor Mobility: Did the patient need assistance with walking from room to room (with or without device)? Independent   Stairs: Did the patient need assistance with internal or external stairs (with or without device)? Independent   Functional Cognition: Did the patient need help planning regular tasks such as shopping or remembering to take medications? Independent   Patient Information Are you of Hispanic, Latino/a,or Spanish origin?: B. Yes, [place], [place] American, Chicano/a What is your race?: A. White Do you need or want an interpreter to communicate with a doctor or health care staff?: 1. Yes   Patient's Response To:  Health Literacy and Transportation Is the patient able to respond to health literacy and transportation needs?: Yes Health Literacy - How often do you need to have someone help you when you read instructions, pamphlets, or other written material from your doctor or pharmacy?: Sometimes In the past 12 months, has lack of transportation kept you from medical appointments or from  getting medications?: No In the past 12 months, has lack of transportation kept you from meetings, work, or from getting things needed for daily living?: No   Development worker, international aid / [place] Devices/Equipment: None Home Equipment: Grab bars - tub/shower   Prior Device Use: Indicate devices/aids used by the patient prior to current illness, exacerbation or injury? Walker   Current Functional  Level Cognition   Overall Cognitive Status:  (to be further assessed for higher executive function. delayed responses to some questions) Difficult to assess due to: Non-English speaking (Spanish) Orientation Level: Oriented X4 Following Commands: Follows one step commands inconsistently, Follows one step commands with increased time Safety/Judgement: Decreased awareness of safety, Decreased awareness of deficits General Comments: pt answering questions with delay at times. pt able to follow 2 step commands.    Extremity Assessment (includes Sensation/Coordination)   Upper Extremity Assessment: RUE deficits/detail RUE Deficits / Details: provided coloring task for R UE activation RUE Sensation: decreased proprioception RUE Coordination: decreased fine motor, decreased gross motor  Lower Extremity Assessment: Defer to PT evaluation RLE Deficits / Details: Gross weakness MMT scores of 4 to 4+ compared to 4+ to 5 on L; denied numbness/tingling; incoordination noted RLE Sensation: decreased proprioception RLE Coordination: decreased fine motor, decreased gross motor     ADLs   Overall ADL's : Needs assistance/impaired Eating/Feeding: Maximal assistance Eating/Feeding Details (indicate cue type and reason): pt will need AE to help with self feeding but he has elbow flexion Grooming: Maximal assistance Grooming Details (indicate cue type and reason): hand over hadn to wash face Toilet Transfer: +2 for physical assistance, Moderate assistance, Stand-pivot Toilet Transfer Details  (indicate cue type and reason): simulated eob to chair General ADL Comments: pt able to progress to chair sitting. pt without chair alarm as unit no alarms at this time. Secretary working on obtaining. Wife and RN made aware in addition to patient fall risk . pt reading spanish sign for calling for RN and not falling to use the call bell.     Mobility   Overal bed mobility: Needs Assistance Bed Mobility: Rolling, Supine to Sit Rolling: +2 for physical assistance, Mod assist Supine to sit: +2 for physical assistance, Mod assist Sit to supine: Mod assist General bed mobility comments: pt able to progress bil LE toward EOB with (A) to place off  bed. pt reaching with R UE toward bed rail with decreased shoulder flexion, needing hand-over-hand to direct it to rail. pt pushing up with L UE to come to static sitting.     Transfers   Overall transfer level: Needs assistance Equipment used: Rolling walker (2 wheeled) Transfers: Sit to/from Stand, Stand Pivot Transfers Sit to Stand: +2 physical assistance, Max assist, Mod assist Stand pivot transfers: +2 physical assistance, Mod assist General transfer comment: ModAx2 to power up to stand and steady, no sliding of feet this date. ModAx2 to steady with pt taking a little time to get steady with RW.  Pt then able to take pivotal steps to chair, did not note R knee instability. Asked pt to walk forward and back and pt was able to - see belwo.     Ambulation / Gait / Stairs / Wheelchair Mobility   Ambulation/Gait Ambulation/Gait assistance: Mod assist, +2 physical assistance Gait Distance (Feet): 5 Feet Assistive device: Rolling walker (2 wheeled) Gait Pattern/deviations: Decreased stride length, Decreased step length - right, Decreased dorsiflexion - right, Narrow base of support, Decreased step length - left General Gait Details: Pt was able to take steps forward and then back with mod cues and min to mod assist for stability with use of RW. Pt needed  cues to take larger steps as he was taking small steps unless cued. Was able to move his right LE on his own. Gait velocity: reduced Gait velocity interpretation: <1.31 ft/sec, indicative of household ambulator     Posture / Balance  Dynamic Sitting Balance Sitting balance - Comments: pt static sitting with min guard (A) Balance Overall balance assessment: Needs assistance Sitting-balance support: Single extremity supported, Feet supported Sitting balance-Leahy Scale: Fair Sitting balance - Comments: pt static sitting with min guard (A) Postural control: Right lateral lean Standing balance support: Bilateral upper extremity supported, During functional activity Standing balance-Leahy Scale: Poor Standing balance comment: UE support and min-modAx2.     Special needs/care consideration Skin bilateral groin hematomas, Ecchymosis to bilateral thighs    Previous Home Environment (from acute therapy documentation) Living Arrangements: Spouse/significant other  Lives With: Spouse Available Help at Discharge: Family, Available 24 hours/day Type of Home: Apartment Home Layout: One level Home Access: Building control surveyor Shower/Tub: Multimedia programmer: Handicapped height Bathroom Accessibility: Yes How Accessible: Accessible via walker [place]: No   Discharge Living Setting Plans for Discharge Living Setting: Patient's home Type of Home at Discharge: Apartment Discharge Home Layout: One level Discharge Home Access: Elevator Discharge Bathroom Shower/Tub: Walk-in shower Discharge Bathroom Toilet: Handicapped height Discharge Bathroom Accessibility: Yes How Accessible: Accessible via walker Does the patient have any problems obtaining your medications?: No   Social/Family/Support Systems Patient Roles: Spouse Contact Information: [phone] Anticipated Caregiver: Sandra Cockayne Anticipated Caregiver's Contact Information: [phone] Caregiver Availability:  24/7 Discharge Plan Discussed with Primary Caregiver: Yes Is Caregiver In Agreement with Plan?: No   Goals Patient/Family Goal for Rehab: PT/OT min a Expected length of stay: 21-24 days Pt/Family Agrees to Admission and willing to participate: Yes Program Orientation Provided & Reviewed with Pt/Caregiver Including Roles  & Responsibilities: Yes   Decrease burden of Care through IP rehab admission: Specialzed equipment needs, Decrease number of caregivers, Bowel and bladder program, and Patient/family education   Possible need for SNF placement upon discharge: Possible need for reduced burden of care if Pt. Cannot progress to min assist level.    Patient Condition: I have reviewed medical records from [facility], spoken with CM, and patient and family member. I met with patient at the bedside for inpatient rehabilitation assessment.  Patient will benefit from ongoing PT and OT, can actively participate in 3 hours of therapy a day 5 days of the week, and can make measurable gains during the admission.  Patient will also benefit from the coordinated team approach during an Inpatient Acute Rehabilitation admission.  The patient will receive intensive therapy as well as Rehabilitation physician, nursing, social worker, and care management interventions.  Due to safety, skin/wound care, disease management, medication administration, pain management, and patient education the patient requires 24 hour a day rehabilitation nursing.  The patient is currently mod A-total A with mobility and basic ADLs.  Discharge setting and therapy post discharge at home with outpatient is anticipated.  Patient has agreed to participate in the Acute Inpatient Rehabilitation Program and will admit today.   Preadmission Screen Completed By:  Genella Mech, 07/26/2021 9:25 AM ______________________________________________________________________   Discussed status with Dr.Lovorn on 07/26/21 at 30 and received  approval for admission today.   Admission Coordinator:  Genella Mech, CCC-SLP, time 930/Date:  07/26/21    Assessment/Plan: Diagnosis: Does the need for close, 24 hr/day Medical supervision in concert with the patient's rehab needs make it unreasonable for this patient to be served in a less intensive setting? Yes Co-Morbidities requiring supervision/potential complications: L MCA stroke with aphasia, dysphagia, and R hemiplegia; and inattention Due to bladder management, bowel management, safety, skin/wound care, disease management, medication administration, and patient education, does the patient  require 24 hr/day rehab nursing? Yes Does the patient require coordinated care of a physician, rehab nurse, PT, OT, and SLP to address physical and functional deficits in the context of the above medical diagnosis(es)? Yes Addressing deficits in the following areas: balance, endurance, locomotion, strength, transferring, bowel/bladder control, bathing, dressing, feeding, grooming, toileting, cognition, speech, language, and swallowing Can the patient actively participate in an intensive therapy program of at least 3 hrs of therapy 5 days a week? Yes The potential for patient to make measurable gains while on inpatient rehab is good and fair Anticipated functional outcomes upon discharge from inpatient rehab: min assist PT, supervision and min assist OT, supervision and min assist SLP Estimated rehab length of stay to reach the above functional goals is: 21-24 days Anticipated discharge destination: Home 10. Overall Rehab/Functional Prognosis: good and fair     MD Signature:

## 2021-07-26 NOTE — Progress Notes (Signed)
Inpatient Rehab Admissions Coordinator:   I have a CIR bed for this Pt. And will plan to admit today. RN may call report to [phone].  Megan Salon, MS, CCC-SLP Rehab Admissions Coordinator  [phone] (celll) [phone] (office)

## 2021-07-26 NOTE — Progress Notes (Signed)
Patient ID: Roberto Dickson, male   DOB: [birth year], 80 y.o.   MRN: [id number] Patient arrived on unit, oriented to unit. Reviewed medications, therapy schedule, rehab routine and plan of care. States an understanding of information reviewed. No complications noted at this time. Patient reports no pain and is AX4 [employer]

## 2021-07-26 NOTE — H&P (Signed)
Physical Medicine and Rehabilitation Admission H&P    Chief Complaint  Patient presents with   Functional deficits due to stroke    HPI:  Roberto Dickson is a 80 year old RH- male ([place]) with history of HTN otherwise in good health with mild HTN per pt, on meds; who was admitted on 07/19/21 with RUE weakness, left gaze deviation and inability to speak. 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 He  underwent cerebral angio with revascularization of L-ICA from proximal to distal terminus and origin of L-MCA by Dr. Corliss Skains. MRI/MRA brain showed scattered acute infarcts in left cerebellum and both cerebral hemispheres --largest in left posterior frontal region c/w cardiac or aortic source. 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 Follow up ultrasound showed pseudoaneurysm resolve with 2.3 X 3.0 right groin hematoma. He was noted to have rise in WBC to 17.2 with drop in H/H and low grade fevers as well as tachypnea with hypoxia requiring supplemental oxygen.  He was started on IV rocephin due to concerns of UTI.  Fevers have resolved but he continues to be limited by balance deficits with posterior lean, right knee instability, RUE weakness, has difficulty feeding self and performing ADL tasks. CIR recommended due to functional decline.     Pt reports LBM was 8 days ago- denies SOB or aphasia. Poor appetite.   Also c/o RLE jumping/moving on its own, but not painful.   Review of Systems  Constitutional:  Negative for chills and fever.  HENT:  Positive for hearing loss.   Eyes:  Negative for blurred vision and double vision.  Respiratory:  Negative for cough and shortness of  breath.   Cardiovascular:  Negative for chest pain and leg swelling.  Gastrointestinal:  Positive for constipation (no BM for 8 days).       No appetite  Genitourinary:        Foley in place  Musculoskeletal:  Positive for myalgias (indicating that legs/thighs hurt).  Neurological:  Positive for weakness.  All other systems reviewed and are negative.   Past Medical History:  Diagnosis Date   Hypertension     Past Surgical History:  Procedure Laterality Date   APPENDECTOMY     in his 60's   IR CT HEAD LTD  07/19/2021   IR FLUORO GUIDED NEEDLE PLC ASPIRATION/INJECTION LOC  07/21/2021   IR PERCUTANEOUS ART THROMBECTOMY/INFUSION INTRACRANIAL INC DIAG ANGIO  07/19/2021   RADIOLOGY WITH ANESTHESIA N/A 07/19/2021   Procedure: IR WITH ANESTHESIA;  Surgeon: Julieanne Cotton, MD;  Location: [facility] OR;  Service: Radiology;  Laterality: N/A;    Family History  Problem Relation Age of Onset   Cancer - Prostate Father     Social History:  Married. Lives with wife--lived in [place] for 20 years then moved to [place] and to [place] 3 yrs ago.  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 He has never used smokeless tobacco. He reports current alcohol occasionally w/ party"". No history on file for drug use.   Allergies: No Known Allergies   Medications Prior to Admission  Medication Sig Dispense Refill   enalapril (VASOTEC) 20 MG tablet Take 20 mg by mouth daily.  hydrochlorothiazide (HYDRODIURIL) 12.5 MG tablet Take 12.5 mg by mouth daily.     ibuprofen (ADVIL) 200 MG tablet Take 200 mg by mouth every 6 (six) hours as needed for mild pain.      Drug Regimen Review  Drug regimen was reviewed and remains appropriate with no significant issues identified  Home: Home Living Family/patient expects to be discharged to:: Private residence Living Arrangements: Spouse/significant other Available Help at Discharge: Family, Available 24 hours/day Type of Home: Apartment Home  Access: Elevator Home Layout: One level Bathroom Shower/Tub: Health visitor: Handicapped height Bathroom Accessibility: Yes Home Equipment: Grab bars - tub/shower  Lives With: Spouse   Functional History: Prior Function Level of Independence: Independent Comments: pt can understand some english but speaks spanish. pt asked color of item and states "red"  Functional Status:  Mobility: Bed Mobility Overal bed mobility: Needs Assistance Bed Mobility: Rolling, Supine to Sit Rolling: +2 for physical assistance, Mod assist Supine to sit: +2 for physical assistance, Mod assist Sit to supine: Mod assist General bed mobility comments: pt able to progress bil LE toward EOB with (A) to place off  bed. pt reaching with R UE toward bed rail with decreased shoulder flexion, needing hand-over-hand to direct it to rail. pt pushing up with L UE to come to static sitting. Transfers Overall transfer level: Needs assistance Equipment used: Rolling walker (2 wheeled) Transfers: Sit to/from Stand, Stand Pivot Transfers Sit to Stand: +2 physical assistance, Max assist, Mod assist Stand pivot transfers: +2 physical assistance, Mod assist General transfer comment: ModAx2 to power up to stand and steady, no sliding of feet this date. ModAx2 to steady with pt taking a little time to get steady with RW.  Pt then able to take pivotal steps to chair, did not note R knee instability. Asked pt to walk forward and back and pt was able to - see belwo. Ambulation/Gait Ambulation/Gait assistance: Mod assist, +2 physical assistance Gait Distance (Feet): 5 Feet Assistive device: Rolling walker (2 wheeled) Gait Pattern/deviations: Decreased stride length, Decreased step length - right, Decreased dorsiflexion - right, Narrow base of support, Decreased step length - left General Gait Details: Pt was able to take steps forward and then back with mod cues and min to mod assist for stability with use of RW. Pt  needed cues to take larger steps as he was taking small steps unless cued. Was able to move his right LE on his own. Gait velocity: reduced Gait velocity interpretation: <1.31 ft/sec, indicative of household ambulator    ADL: ADL Overall ADL's : Needs assistance/impaired Eating/Feeding: Maximal assistance Eating/Feeding Details (indicate cue type and reason): pt will need AE to help with self feeding but he has elbow flexion Grooming: Maximal assistance Grooming Details (indicate cue type and reason): hand over hadn to wash face Toilet Transfer: +2 for physical assistance, Moderate assistance, Stand-pivot Toilet Transfer Details (indicate cue type and reason): simulated eob to chair General ADL Comments: pt able to progress to chair sitting. pt without chair alarm as unit no alarms at this time. Secretary working on obtaining. Wife and RN made aware in addition to patient fall risk . pt reading spanish sign for calling for RN and not falling to use the call bell.  Cognition: Cognition Overall Cognitive Status:  (to be further assessed for higher executive function. delayed responses to some questions) Orientation Level: Oriented X4 Cognition Arousal/Alertness: Awake/alert Behavior During Therapy: Flat affect Overall Cognitive Status:  (to be further assessed for higher executive  function. delayed responses to some questions) Area of Impairment: Following commands, Safety/judgement, Awareness, Problem solving Following Commands: Follows one step commands inconsistently, Follows one step commands with increased time Safety/Judgement: Decreased awareness of safety, Decreased awareness of deficits Awareness: Emergent Problem Solving: Slow processing, Difficulty sequencing, Requires verbal cues, Requires tactile cues General Comments: pt answering questions with delay at times. pt able to follow 2 step commands. Difficult to assess due to: Non-English speaking (Spanish)   Blood pressure  107/82, pulse 100, temperature 99.1 F (37.3 C), temperature source Oral, resp. rate 19, weight 90.5 kg, SpO2 97 %. Physical Exam Vitals and nursing note reviewed.  Constitutional:      Appearance: Normal appearance.     Comments: Elderly male sitting up in bed; used tele-interpretor to interview/examine; NAD  HENT:     Head: Normocephalic and atraumatic.     Comments: Smile equal at rest/tongue midline    Nose: Nose normal. No congestion.     Mouth/Throat:     Mouth: Mucous membranes are dry.     Pharynx: Oropharynx is clear. No oropharyngeal exudate.  Eyes:     General:        Right eye: No discharge.        Left eye: No discharge.     Extraocular Movements: Extraocular movements intact.  Cardiovascular:     Rate and Rhythm: Normal rate and regular rhythm.     Heart sounds: Normal heart sounds. No murmur heard.   No gallop.  Pulmonary:     Breath sounds: Wheezing (occasional audible sounds) present.     Comments: Few scattered wheezes, but otherwise good air movement and no R/R- denies SOB, even without O2 Abdominal:     Palpations: Abdomen is soft.     Comments: Protuberant/distended; soft; hyperactive BS  Musculoskeletal:        General: Tenderness (right knee with ROM and mild effusion noted) present.     Cervical back: Normal range of motion. No rigidity.     Comments: RUE- biceps 4-/5. Triceps 4-/5, WE 2+/5, grip 2-/5, and FA 2-/5 LUE 5/5 in same muscles RLE- HF 4-/5, KE 3/5, DF 3+/5 and PF 3+/5 LLE- 5/5 in same muscles  Skin:    Comments: Right flank hematoma extending to RLQ and bilateral inner thighs.  Very purple and line drawn around it, shows it's stable size vs slightly smaller- TTP- no scrotal swelling  Neurological:     Mental Status: He is alert and oriented to person, place, and time.     Comments: Question mild hearing loss. Mild dysarthria noted. He was able to follow simple motor commands and answer biographic questions appropriately with use of [employer]  interpreter.  No hoffman's; no clonus- no increased tone seen on exam.  Decreased to light touch on R side of face as well as R side of body compared to L side. No aphasia  Psychiatric:        Mood and Affect: Mood normal.        Behavior: Behavior normal.    Results for orders placed or performed during the hospital encounter of 07/19/21 (from the past 48 hour(s))  Protime-INR     Status: Abnormal   Collection Time: 07/25/21  4:43 AM  Result Value Ref Range   Prothrombin Time 16.6 (H) 11.4 - 15.2 seconds   INR 1.3 (H) 0.8 - 1.2    Comment: (NOTE) INR goal varies based on device and disease states. Performed at [facility], [address], [place], [place] [address]   CBC     Status: Abnormal   Collection Time: 07/25/21  4:43 AM  Result Value Ref Range   WBC 15.1 (H) 4.0 - 10.5 K/uL   RBC 3.36 (L) 4.22 - 5.81 MIL/uL   Hemoglobin 10.7 (L) 13.0 - 17.0 g/dL   HCT 60.4 (L) 54.0 - 98.1 %   MCV 94.3 80.0 - 100.0 fL   MCH 31.8 26.0 - 34.0 pg   MCHC 33.8 30.0 - 36.0 g/dL   RDW 19.1 47.8 - 29.5 %   Platelets 214 150 - 400 K/uL   nRBC 0.0 0.0 - 0.2 %    Comment: Performed at [facility], [address] [place], [place] [address]  Basic metabolic panel     Status: Abnormal   Collection Time: 07/25/21  4:43 AM  Result Value Ref Range   Sodium 132 (L) 135 - 145 mmol/L   Potassium 3.7 3.5 - 5.1 mmol/L   Chloride 102 98 - 111 mmol/L   CO2 19 (L) 22 - 32 mmol/L   Glucose, Bld 101 (H) 70 - 99 mg/dL    Comment: Glucose reference range applies only to samples taken after fasting for at least 8 hours.   BUN 25 (H) 8 - 23 mg/dL   Creatinine, Ser 8.65 (H) 0.61 - 1.24 mg/dL   Calcium 7.9 (L) 8.9 - 10.3 mg/dL   GFR, Estimated 46 (L) >60 mL/min    Comment: (NOTE) Calculated using the CKD-EPI Creatinine Equation (2021)    Anion gap 11 5 - 15    Comment: Performed at [facility], [address] [place], [place] [address]  Protime-INR     Status: Abnormal   Collection  Time: 07/26/21  1:31 AM  Result Value Ref Range   Prothrombin Time 20.3 (H) 11.4 - 15.2 seconds   INR 1.7 (H) 0.8 - 1.2    Comment: (NOTE) INR goal varies based on device and disease states. Performed at [facility], [address] [place], [place] [address]   CBC     Status: Abnormal   Collection Time: 07/26/21  1:31 AM  Result Value Ref Range   WBC 12.4 (H) 4.0 - 10.5 K/uL   RBC 3.31 (L) 4.22 - 5.81 MIL/uL   Hemoglobin 10.6 (L) 13.0 - 17.0 g/dL   HCT 84.1 (L) 32.4 - 40.1 %   MCV 93.1 80.0 - 100.0 fL   MCH 32.0 26.0 - 34.0 pg   MCHC 34.4 30.0 - 36.0 g/dL   RDW 02.7 25.3 - 66.4 %   Platelets 254 150 - 400 K/uL   nRBC 0.0 0.0 - 0.2 %    Comment: Performed at [facility], [address], [place], [place] [address]  Basic metabolic panel     Status: Abnormal   Collection Time: 07/26/21  1:31 AM  Result Value Ref Range   Sodium 132 (L) 135 - 145 mmol/L   Potassium 3.8 3.5 - 5.1 mmol/L   Chloride 103 98 - 111 mmol/L   CO2 21 (L) 22 - 32 mmol/L   Glucose, Bld 105 (H) 70 - 99 mg/dL    Comment: Glucose reference range applies only to samples taken after fasting for at least 8 hours.   BUN 29 (H) 8 - 23 mg/dL   Creatinine, Ser 4.25 (H) 0.61 - 1.24 mg/dL   Calcium 7.7 (L) 8.9 - 10.3 mg/dL   GFR, Estimated 43 (L) >60 mL/min  Comment: (NOTE) Calculated using the CKD-EPI Creatinine Equation (2021)    Anion gap 8 5 - 15    Comment: Performed at [facility], [address] [place], [place] [id number]   No results found.     Medical Problem List and Plan: 1.  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  LV thrombusAntithrombotics: -DVT/anticoagulation:  Pharmaceutical: Lovenox treatment dose bridge to coumadin.   -antiplatelet therapy: N/A 3. Pain Management: tylenol prn for thigh/groin pain.  4. Mood: LCSW to follow for evaluation and support.   -antipsychotic agents:  N/A 5. Neuropsych: This patient may be capable of making decisions on his own behalf. 6. Skin/Wound Care: Routine pressure relief measures.  7. Fluids/Electrolytes/Nutrition: Monitor I/O. Check lytes in am.  8. HTN: Monitor BP TID--was taking BP meds daily (has online provider) --Continue metoprolol 9. Acute on chronic renal failure?: Likely due to untreated HTN.  --BUN/SCr trending up--> 25/1.53 at admission -->29/1.63  --monitor weekly. Recheck BMET in am.  --Encourage fluid intake.  10. Leucocytosis: Likely due to pyuria (UCS negative)  --Treated with Rocephin D#4/5 11. Dyslipidemia: LDL-132. On Lipitor.  12. Hyponatremia:  Na trending down-->recheck in am.  13. Hypocalcemia: Ionized calcium 1.11 -->add calcium supplement.  14. Pre-diabetes: Hgb A1C-5.9.  13. Anorexia/Constipation-  Has not had a BM in 8 days. Ducolax tabs tonight (used PTA) with enema in am if no results.  --Needs assistance with meals.   14. BLE spasms? RLS?: Reports that legs "jump at any moment"- since mainly RLE- could be minimal signs of spasticity?  --monitor for now.  15. ABLA/Right groin hematoma: Continue to trend H/H with AC on board --Admission hgb 15.8-->10.6.     Jacquelynn Creeamela S Love, PA-C 07/26/2021   I have personally performed a face to face diagnostic evaluation of this patient and formulated the key components of the plan.  Additionally, I have personally reviewed laboratory data, imaging studies, as well as relevant notes and concur with the physician assistant's documentation above.   The patient's status has not changed from the original H&P.  Any changes in documentation from the acute care chart have been noted above.

## 2021-07-26 NOTE — PMR Pre-admission (Signed)
PMR Admission Coordinator Pre-Admission Assessment  Patient: Roberto Dickson is an 80 y.o., male MRN: [id number] DOB: 10-20-1941 Height:   Weight: 90.5 kg  Insurance Information HMO:      PPO:      PCP:      IPA:      80/20:   yes   OTHER:  PRIMARY:   Medicare Part A and B     Policy#: [id number]      Subscriber: Pt. Phone#: Verified online    Fax#:  Pre-Cert#:       Employer:  Benefits:  Phone #:      Name:  Eff. Date: Parts A  effective 1/1/22009 and B effective 06/03/2020  Deduct: $1556      Out of Pocket Max:  None      Life Max: N/A  CIR: 100%      SNF: 100 days Outpatient: 80%     Co-Pay: 20% Home Health: 100%      Co-Pay: none DME: 80%     Co-Pay: 20% Providers: patient's choice  SECONDARY: Medicaid of [place]        Policy#: [id number] N     Phone#: [phone] N  Financial Counselor: none       Phone#:   The "Data Collection Information Summary" for patients in Inpatient Rehabilitation Facilities with attached "Privacy Act [place] Records" was provided and verbally reviewed with: Patient  Emergency Contact Information Contact Information     Name Relation Home Work Mobile   [place] Daughter   [phone]   Louretta Parma   [phone]       Current Medical History  Patient Admitting Diagnosis: CVA History of Present Illness: .Mr. Deavers was admitted 07/19/21 for stroke. He woke up from a nap and could not get out of bed and was unable to talk. Wife and daughter called EMS. EMS noted L gaze deviation, R facial droop RUE weakness and aphasia. He was brought in as CODE STROKE. He was administered TPA. 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 HE was admitted to ICU for further work-up. He was extubated 8/17 to 3L Meyer.  Course complicated by bilateral groin hematomas. CIR was consulted to assist in return to PLOF.  Complete NIHSS TOTAL: 0  Patient's medical record from [facility]  has been reviewed by the  rehabilitation admission coordinator and physician.  Past Medical History  Past Medical History:  Diagnosis Date   Hypertension     Has the patient had major surgery during 100 days prior to admission? Yes  Family History   family history is not on file.  Current Medications  Current Facility-Administered Medications:    0.9 %  sodium chloride infusion, 250 mL, Intravenous, Continuous, Deland Pretty, MD   acetaminophen (TYLENOL) tablet 650 mg, 650 mg, Oral, Q4H PRN, 650 mg at 07/26/21 0901 **OR** acetaminophen (TYLENOL) 160 MG/5ML solution 650 mg, 650 mg, Per Tube, Q4H PRN **OR** acetaminophen (TYLENOL) suppository 650 mg, 650 mg, Rectal, Q4H PRN, Deveshwar, Sanjeev, MD   atorvastatin (LIPITOR) tablet 40 mg, 40 mg, Oral, Daily, Agarwala, Ravi, MD, 40 mg at 07/26/21 0901   cefTRIAXone (ROCEPHIN) 1 g in sodium chloride 0.9 % 100 mL IVPB, 1 g, Intravenous, Q24H, Rosalin Hawking, MD, Last Rate: 200 mL/hr at 07/25/21 1450, 1 g at 07/25/21 1450   Chlorhexidine Gluconate Cloth 2 % PADS 6 each, 6 each, Topical, Daily, Donnetta Simpers, MD, 6 each at 07/26/21 0901   docusate sodium (COLACE) capsule  100 mg, 100 mg, Oral, BID, Donnamae Jude, RPH, 100 mg at 07/26/21 0901   enoxaparin (LOVENOX) injection 90 mg, 1 mg/kg, Subcutaneous, BID, Lavenia Atlas, RPH, 90 mg at 07/26/21 0901   metoprolol succinate (TOPROL-XL) 24 hr tablet 25 mg, 25 mg, Oral, QHS, Buford Dresser, MD, 25 mg at 07/25/21 2147   pantoprazole (PROTONIX) EC tablet 40 mg, 40 mg, Oral, QHS, Leonie Man, Pramod S, MD, 40 mg at 07/25/21 2148   polyethylene glycol (MIRALAX / GLYCOLAX) packet 17 g, 17 g, Oral, Daily, Donnetta Simpers, MD, 17 g at 07/26/21 0901   senna-docusate (Senokot-S) tablet 1 tablet, 1 tablet, Oral, QHS PRN, Donnetta Simpers, MD, 1 tablet at 07/25/21 1853   tamsulosin [facility]) capsule 0.4 mg, 0.4 mg, Oral, Daily, Donnetta Simpers, MD, 0.4 mg at 07/26/21 0901   thrombin spray, , , PRN, Corrie Mckusick, DO, 5,000 Units at 07/21/21 1510   warfarin (COUMADIN) tablet 7.5 mg, 7.5 mg, Oral, ONCE-1600, Rozann Lesches, [facility]   Warfarin - Pharmacist Dosing Inpatient, , Does not apply, q1600, von Verdene Rio B, RPH  Patients Current Diet:  Diet Order             Diet Heart Room service appropriate? Yes; Fluid consistency: Thin  Diet effective now                   Precautions / Restrictions Precautions Precautions: Fall Precaution Comments: watch BP, interpreter [phone] Ronnald Collum) and then [phone] Arty Baumgartner). Restrictions Weight Bearing Restrictions: No   Has the patient had 2 or more falls or a fall with injury in the past year? No  Prior Activity Level Limited Community (1-2x/wk): Pt went out about once a week  Prior Functional Level Self Care: Did the patient need help bathing, dressing, using the toilet or eating? Independent  Indoor Mobility: Did the patient need assistance with walking from room to room (with or without device)? Independent  Stairs: Did the patient need assistance with internal or external stairs (with or without device)? Independent  Functional Cognition: Did the patient need help planning regular tasks such as shopping or remembering to take medications? Independent  Patient Information Are you of Hispanic, Latino/a,or Spanish origin?: B. Yes, [place], [place] American, Chicano/a What is your race?: A. White Do you need or want an interpreter to communicate with a doctor or health care staff?: 1. Yes  Patient's Response To:  Health Literacy and Transportation Is the patient able to respond to health literacy and transportation needs?: Yes Health Literacy - How often do you need to have someone help you when you read instructions, pamphlets, or other written material from your doctor or pharmacy?: Sometimes In the past 12 months, has lack of transportation kept you from medical appointments or from getting medications?: No In the past 12 months, has  lack of transportation kept you from meetings, work, or from getting things needed for daily living?: No  Development worker, international aid / [place] Devices/Equipment: None Home Equipment: Grab bars - tub/shower  Prior Device Use: Indicate devices/aids used by the patient prior to current illness, exacerbation or injury? Walker  Current Functional Level Cognition  Overall Cognitive Status:  (to be further assessed for higher executive function. delayed responses to some questions) Difficult to assess due to: Non-English speaking (Spanish) Orientation Level: Oriented X4 Following Commands: Follows one step commands inconsistently, Follows one step commands with increased time Safety/Judgement: Decreased awareness of safety, Decreased awareness of deficits General Comments: pt answering questions with delay  at times. pt able to follow 2 step commands.    Extremity Assessment (includes Sensation/Coordination)  Upper Extremity Assessment: RUE deficits/detail RUE Deficits / Details: provided coloring task for R UE activation RUE Sensation: decreased proprioception RUE Coordination: decreased fine motor, decreased gross motor  Lower Extremity Assessment: Defer to PT evaluation RLE Deficits / Details: Gross weakness MMT scores of 4 to 4+ compared to 4+ to 5 on L; denied numbness/tingling; incoordination noted RLE Sensation: decreased proprioception RLE Coordination: decreased fine motor, decreased gross motor    ADLs  Overall ADL's : Needs assistance/impaired Eating/Feeding: Maximal assistance Eating/Feeding Details (indicate cue type and reason): pt will need AE to help with self feeding but he has elbow flexion Grooming: Maximal assistance Grooming Details (indicate cue type and reason): hand over hadn to wash face Toilet Transfer: +2 for physical assistance, Moderate assistance, Stand-pivot Toilet Transfer Details (indicate cue type and reason): simulated eob to chair General  ADL Comments: pt able to progress to chair sitting. pt without chair alarm as unit no alarms at this time. Secretary working on obtaining. Wife and RN made aware in addition to patient fall risk . pt reading spanish sign for calling for RN and not falling to use the call bell.    Mobility  Overal bed mobility: Needs Assistance Bed Mobility: Rolling, Supine to Sit Rolling: +2 for physical assistance, Mod assist Supine to sit: +2 for physical assistance, Mod assist Sit to supine: Mod assist General bed mobility comments: pt able to progress bil LE toward EOB with (A) to place off  bed. pt reaching with R UE toward bed rail with decreased shoulder flexion, needing hand-over-hand to direct it to rail. pt pushing up with L UE to come to static sitting.    Transfers  Overall transfer level: Needs assistance Equipment used: Rolling walker (2 wheeled) Transfers: Sit to/from Stand, Stand Pivot Transfers Sit to Stand: +2 physical assistance, Max assist, Mod assist Stand pivot transfers: +2 physical assistance, Mod assist General transfer comment: ModAx2 to power up to stand and steady, no sliding of feet this date. ModAx2 to steady with pt taking a little time to get steady with RW.  Pt then able to take pivotal steps to chair, did not note R knee instability. Asked pt to walk forward and back and pt was able to - see belwo.    Ambulation / Gait / Stairs / Wheelchair Mobility  Ambulation/Gait Ambulation/Gait assistance: Mod assist, +2 physical assistance Gait Distance (Feet): 5 Feet Assistive device: Rolling walker (2 wheeled) Gait Pattern/deviations: Decreased stride length, Decreased step length - right, Decreased dorsiflexion - right, Narrow base of support, Decreased step length - left General Gait Details: Pt was able to take steps forward and then back with mod cues and min to mod assist for stability with use of RW. Pt needed cues to take larger steps as he was taking small steps unless cued.  Was able to move his right LE on his own. Gait velocity: reduced Gait velocity interpretation: <1.31 ft/sec, indicative of household ambulator    Posture / Balance Dynamic Sitting Balance Sitting balance - Comments: pt static sitting with min guard (A) Balance Overall balance assessment: Needs assistance Sitting-balance support: Single extremity supported, Feet supported Sitting balance-Leahy Scale: Fair Sitting balance - Comments: pt static sitting with min guard (A) Postural control: Right lateral lean Standing balance support: Bilateral upper extremity supported, During functional activity Standing balance-Leahy Scale: Poor Standing balance comment: UE support and min-modAx2.    Special needs/care  consideration Skin bilateral groin hematomas, Ecchymosis to bilateral thighs   Previous Home Environment (from acute therapy documentation) Living Arrangements: Spouse/significant other  Lives With: Spouse Available Help at Discharge: Family, Available 24 hours/day Type of Home: Apartment Home Layout: One level Home Access: Building control surveyor Shower/Tub: Multimedia programmer: Handicapped height Bathroom Accessibility: Yes How Accessible: Accessible via walker [place]: No  Discharge Living Setting Plans for Discharge Living Setting: Patient's home Type of Home at Discharge: Apartment Discharge Home Layout: One level Discharge Home Access: Elevator Discharge Bathroom Shower/Tub: Walk-in shower Discharge Bathroom Toilet: Handicapped height Discharge Bathroom Accessibility: Yes How Accessible: Accessible via walker Does the patient have any problems obtaining your medications?: No  Social/Family/Support Systems Patient Roles: Spouse Contact Information: [phone] Anticipated Caregiver: Sandra Cockayne Anticipated Caregiver's Contact Information: [phone] Caregiver Availability: 24/7 Discharge Plan Discussed with Primary Caregiver: Yes Is Caregiver In  Agreement with Plan?: No  Goals Patient/Family Goal for Rehab: PT/OT min a Expected length of stay: 21-24 days Pt/Family Agrees to Admission and willing to participate: Yes Program Orientation Provided & Reviewed with Pt/Caregiver Including Roles  & Responsibilities: Yes  Decrease burden of Care through IP rehab admission: Specialzed equipment needs, Decrease number of caregivers, Bowel and bladder program, and Patient/family education  Possible need for SNF placement upon discharge: Possible need for reduced burden of care if Pt. Cannot progress to min assist level.   Patient Condition: I have reviewed medical records from [facility], spoken with CM, and patient and family member. I met with patient at the bedside for inpatient rehabilitation assessment.  Patient will benefit from ongoing PT and OT, can actively participate in 3 hours of therapy a day 5 days of the week, and can make measurable gains during the admission.  Patient will also benefit from the coordinated team approach during an Inpatient Acute Rehabilitation admission.  The patient will receive intensive therapy as well as Rehabilitation physician, nursing, social worker, and care management interventions.  Due to safety, skin/wound care, disease management, medication administration, pain management, and patient education the patient requires 24 hour a day rehabilitation nursing.  The patient is currently mod A-total A with mobility and basic ADLs.  Discharge setting and therapy post discharge at home with outpatient is anticipated.  Patient has agreed to participate in the Acute Inpatient Rehabilitation Program and will admit today.  Preadmission Screen Completed By:  Genella Mech, 07/26/2021 9:25 AM ______________________________________________________________________   Discussed status with Dr.Landy Dunnavant on 07/26/21 at 66 and received approval for admission today.  Admission Coordinator:  Genella Mech, CCC-SLP,  time 930/Date:  07/26/21   Assessment/Plan: Diagnosis: Does the need for close, 24 hr/day Medical supervision in concert with the patient's rehab needs make it unreasonable for this patient to be served in a less intensive setting? Yes Co-Morbidities requiring supervision/potential complications: L MCA stroke with aphasia, dysphagia, and R hemiplegia; and inattention Due to bladder management, bowel management, safety, skin/wound care, disease management, medication administration, and patient education, does the patient require 24 hr/day rehab nursing? Yes Does the patient require coordinated care of a physician, rehab nurse, PT, OT, and SLP to address physical and functional deficits in the context of the above medical diagnosis(es)? Yes Addressing deficits in the following areas: balance, endurance, locomotion, strength, transferring, bowel/bladder control, bathing, dressing, feeding, grooming, toileting, cognition, speech, language, and swallowing Can the patient actively participate in an intensive therapy program of at least 3 hrs of therapy 5 days a week? Yes The potential for  patient to make measurable gains while on inpatient rehab is good and fair Anticipated functional outcomes upon discharge from inpatient rehab: min assist PT, supervision and min assist OT, supervision and min assist SLP Estimated rehab length of stay to reach the above functional goals is: 21-24 days Anticipated discharge destination: Home 10. Overall Rehab/Functional Prognosis: good and fair   MD Signature:

## 2021-07-26 NOTE — Progress Notes (Signed)
ANTICOAGULATION CONSULT NOTE   Pharmacy Consult for Heparin to Lovenox and Warfarin Indication:  Apical thrombus (recent stroke)  Labs: Recent Labs    07/23/21 2051 07/24/21 0238 07/24/21 0238 07/24/21 1157 07/25/21 0443 07/26/21 0131  HGB  --  11.0*   < >  --  10.7* 10.6*  HCT  --  32.9*  --   --  31.7* 30.8*  PLT  --  199  --   --  214 254  LABPROT  --  15.6*  --   --  16.6* 20.3*  INR  --  1.2  --   --  1.3* 1.7*  HEPARINUNFRC 0.33 0.27*  --  0.45  --   --   CREATININE  --  1.37*  --   --  1.54* 1.63*   < > = values in this interval not displayed.   Assessment: 52 YOM who presented with an ischemic stroke s/p alteplase at 2000 on 8/16. 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 Currently on warfarin with Lovenox  INR trending up, 1.7 today Scr up to 1.63 today - > watch Lovenox dose CBC stable   Goal of Therapy:  INR 2-3 Anti-Xa level 0.6-1 units/ml 4hrs after LMWH dose given Monitor platelets by anticoagulation protocol: Yes   Plan:  Lovenox 90 mg (1 mg/kg) twice daily)  Repeat Warfarin 7.5 mg x 1 dose today  Daily INR, CBC, s/s bleeding Will plan to stop Lovenox when INR is therapeutic   Thank you Okey Regal, PharmD  Please refer to [facility] for unit-specific pharmacist

## 2021-07-27 LAB — COMPREHENSIVE METABOLIC PANEL
ALT: 29 U/L (ref 0–44)
AST: 41 U/L (ref 15–41)
Albumin: 2.4 g/dL — ABNORMAL LOW (ref 3.5–5.0)
Alkaline Phosphatase: 62 U/L (ref 38–126)
Anion gap: 11 (ref 5–15)
BUN: 27 mg/dL — ABNORMAL HIGH (ref 8–23)
CO2: 24 mmol/L (ref 22–32)
Calcium: 8.3 mg/dL — ABNORMAL LOW (ref 8.9–10.3)
Chloride: 101 mmol/L (ref 98–111)
Creatinine, Ser: 1.49 mg/dL — ABNORMAL HIGH (ref 0.61–1.24)
GFR, Estimated: 48 mL/min — ABNORMAL LOW (ref >60)
Glucose, Bld: 108 mg/dL — ABNORMAL HIGH (ref 70–99)
Potassium: 4.1 mmol/L (ref 3.5–5.1)
Sodium: 136 mmol/L (ref 135–145)
Total Bilirubin: 0.8 mg/dL (ref 0.3–1.2)
Total Protein: 5.9 g/dL — ABNORMAL LOW (ref 6.5–8.1)

## 2021-07-27 LAB — CBC WITH DIFFERENTIAL/PLATELET
Abs Immature Granulocytes: 0.12 10*3/uL — ABNORMAL HIGH (ref 0.00–0.07)
Basophils Absolute: 0.1 10*3/uL (ref 0.0–0.1)
Basophils Relative: 1 %
Eosinophils Absolute: 0.4 10*3/uL (ref 0.0–0.5)
Eosinophils Relative: 3 %
HCT: 30.9 % — ABNORMAL LOW (ref 39.0–52.0)
Hemoglobin: 10.5 g/dL — ABNORMAL LOW (ref 13.0–17.0)
Immature Granulocytes: 1 %
Lymphocytes Relative: 8 %
Lymphs Abs: 0.9 10*3/uL (ref 0.7–4.0)
MCH: 31.4 pg (ref 26.0–34.0)
MCHC: 34 g/dL (ref 30.0–36.0)
MCV: 92.5 fL (ref 80.0–100.0)
Monocytes Absolute: 1.9 10*3/uL — ABNORMAL HIGH (ref 0.1–1.0)
Monocytes Relative: 17 %
Neutro Abs: 8 10*3/uL — ABNORMAL HIGH (ref 1.7–7.7)
Neutrophils Relative %: 70 %
Platelets: 272 10*3/uL (ref 150–400)
RBC: 3.34 MIL/uL — ABNORMAL LOW (ref 4.22–5.81)
RDW: 13.8 % (ref 11.5–15.5)
WBC: 11.4 10*3/uL — ABNORMAL HIGH (ref 4.0–10.5)
nRBC: 0 % (ref 0.0–0.2)

## 2021-07-27 MED ORDER — WARFARIN SODIUM 5 MG PO TABS
5.0000 mg | ORAL_TABLET | Freq: Once | ORAL | Status: AC
  Administered 2021-07-27: 5 mg via ORAL
  Filled 2021-07-27: qty 1

## 2021-07-27 NOTE — Progress Notes (Signed)
Inpatient Rehabilitation Medication Review by a Pharmacist  A complete drug regimen review was completed for this patient to identify any potential clinically significant medication issues.  High Risk Drug Classes Is patient taking? Indication by Medication  Antipsychotic Yes Compazine prn for nausea  Anticoagulant Yes Warfarin/Lovenox for CVA  Antibiotic Yes, as an intravenous medication Ceftriaxone for UTI  Opioid No   Antiplatelet No   Hypoglycemics/insulin No   Vasoactive Medication Yes Metoprolol for BP  Chemotherapy No   Other No      Type of Medication Issue Identified Description of Issue Recommendation(s)  Drug Interaction(s) (clinically significant)     Duplicate Therapy     Allergy     No Medication Administration End Date     Incorrect Dose     Additional Drug Therapy Needed     Significant med changes from prior encounter (inform family/care partners about these prior to discharge). Vasotec/HCTZ replaced by metoprolol   Other       Clinically significant medication issues were identified that warrant physician communication and completion of prescribed/recommended actions by midnight of the next day:  No  Pharmacist comments: No other issues  Time spent performing this drug regimen review (minutes):  10 minutes   Elwin Sleight 07/27/2021 9:59 AM

## 2021-07-27 NOTE — Progress Notes (Signed)
Inpatient Rehabilitation  Patient information reviewed and entered into eRehab system by Cambri Plourde M. Kieana Livesay, M.A., CCC/SLP, PPS Coordinator.  Information including medical coding, functional ability and quality indicators will be reviewed and updated through discharge.    

## 2021-07-27 NOTE — Evaluation (Addendum)
Occupational Therapy Assessment and Plan  Patient Details  Name: Roberto Dickson MRN: [id number] Date of Birth: October 19, 1941  OT Diagnosis: hemiplegia affecting dominant side, muscle weakness (generalized), and decreased standing/sitting balance, motor planning, functional BUE/BLE strength Rehab Potential: Rehab Potential (ACUTE ONLY): Good ELOS: 3 to 3.5 weeks   Today's Date: 07/27/2021 OT Individual Time: 0800-0903 OT Individual Time Calculation (min): 63 min     Hospital Problem: Principal Problem:   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HCC)   Past Medical History:  Past Medical History:  Diagnosis Date   Hypertension    Past Surgical History:  Past Surgical History:  Procedure Laterality Date   APPENDECTOMY     in his 85's   IR CT HEAD LTD  07/19/2021   IR FLUORO GUIDED NEEDLE PLC ASPIRATION/INJECTION LOC  07/21/2021   IR PERCUTANEOUS ART THROMBECTOMY/INFUSION INTRACRANIAL INC DIAG ANGIO  07/19/2021   RADIOLOGY WITH ANESTHESIA N/A 07/19/2021   Procedure: IR WITH ANESTHESIA;  Surgeon: Luanne Bras, MD;  Location: [facility];  Service: Radiology;  Laterality: N/A;    Assessment & Plan Clinical Impression: Patient is a 80 y.o. year old male with history of HTN otherwise in good health with mild HTN per pt, on meds; who was admitted on 07/19/21 with RUE weakness, left gaze deviation and inability to speak. 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 He  underwent cerebral angio with revascularization of L-ICA from proximal to distal terminus and origin of L-MCA by Dr. Estanislado Pandy. MRI/MRA brain showed scattered acute infarcts in left cerebellum and both cerebral hemispheres --largest in left posterior frontal region c/w cardiac or aortic source. 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 Follow up ultrasound showed pseudoaneurysm resolve with 2.3 X 3.0 right groin hematoma. He was noted to have rise in WBC to 17.2 with drop in H/H and low grade fevers as well as tachypnea with hypoxia requiring supplemental oxygen.  He was started on IV rocephin due to concerns of UTI.  Fevers have resolved but he continues to be limited by balance deficits with posterior lean, right knee instability, RUE weakness, has difficulty feeding self and performing ADL tasks. CIR recommended due to functional decline  Patient transferred to CIR on 07/26/2021 .    Patient currently requires max with basic self-care skills secondary to muscle weakness, decreased cardiorespiratoy endurance, impaired timing and sequencing, unbalanced muscle activation, decreased coordination, and decreased motor planning, and decreased sitting balance, decreased standing balance, decreased postural control, hemiplegia, and decreased balance strategies.  Prior to hospitalization, patient could complete BADL/IADL/func mobility with independent .  Patient will benefit from skilled intervention to decrease level of assist with basic self-care skills and increase independence with basic self-care skills prior to discharge home with care partner.  Anticipate patient will require intermittent supervision and minimal physical assistance and follow up home health.  OT - End of Session Activity Tolerance: Tolerates 10 - 20 min activity with multiple rests Endurance Deficit: Yes OT Assessment Rehab Potential (ACUTE ONLY): Good OT Patient demonstrates impairments in the following area(s): Balance;Sensory;Endurance;Motor;Skin Integrity;Vision OT Basic ADL's Functional Problem(s): Grooming;Bathing;Eating;Dressing;Toileting OT Transfers Functional Problem(s): Toilet;Tub/Shower OT Additional Impairment(s): Fuctional  Use of Upper Extremity OT Plan OT Intensity: Minimum of  1-2 x/day, 45 to 90 minutes OT Frequency: 5 out of 7 days OT Duration/Estimated Length of Stay: 3 to 3.5 weeks OT Treatment/Interventions: Balance/vestibular training;Disease mangement/prevention;Neuromuscular re-education;Self Care/advanced ADL retraining;Therapeutic Exercise;Wheelchair propulsion/positioning;UE/LE Strength taining/ROM;Skin care/wound managment;Pain management;DME/adaptive equipment instruction;Community reintegration;Functional electrical stimulation;Patient/family education;Splinting/orthotics;UE/LE Coordination activities;Visual/perceptual remediation/compensation;Therapeutic Activities;Psychosocial support;Functional mobility training;Discharge planning OT Self Feeding Anticipated Outcome(s): mod I OT Basic Self-Care Anticipated Outcome(s): S OT Toileting Anticipated Outcome(s): min A OT Bathroom Transfers Anticipated Outcome(s): CGA OT Recommendation Patient destination: Home Follow Up Recommendations: Home health OT;24 hour supervision/assistance Equipment Recommended: To be determined   OT Evaluation Precautions/Restrictions  Precautions Precautions: Fall Precaution Comments: R hemi, Spanish speaking, R groin hematoma Restrictions Weight Bearing Restrictions: No General Chart Reviewed: Yes Family/Caregiver Present: No Vital Signs   Pain Pain Assessment Pain Scale: 0-10 Pain Score: 0-No pain Home Living/Prior Functioning Home Living Family/patient expects to be discharged to:: Private residence Living Arrangements: Spouse/significant other Available Help at Discharge: Family, Available 24 hours/day Type of Home: Apartment Home Access: Elevator Home Layout: One level Bathroom Shower/Tub: Multimedia programmer: Handicapped height Bathroom Accessibility: Yes  Lives With: Spouse IADL History Homemaking Responsibilities: Yes Occupation: Retired Type of Occupation: book keeping Leisure  and Hobbies: friends/family, Scientist, research (life sciences) by number Prior Function Level of Independence: Independent with basic ADLs, Independent with gait, Independent with transfers Vision Baseline Vision/History: 1 Wears glasses Wears Glasses: Reading only Ability to See in Adequate Light: 0 Adequate Patient Visual Report: No change from baseline Vision Assessment?: Yes Ocular Range of Motion: Restricted on the right Alignment/Gaze Preference: Other (comment) (mild exotropia of R eye) Tracking/Visual Pursuits: Decreased smoothness of eye movement to RIGHT superior field;Decreased smoothness of eye movement to RIGHT inferior field Saccades: Decreased speed of saccadic movement;Additional eye shifts occurred during testing Convergence: Impaired - to be further tested in functional context Visual Fields: Impaired-to be further tested in functional context Additional Comments: Correctly able to read clock and near text. Perception  Perception: Impaired Praxis Praxis: Intact Cognition Overall Cognitive Status: Within Functional Limits for tasks assessed Arousal/Alertness: Awake/alert Orientation Level: Place;Situation;Person Person: Oriented Place: Oriented Situation: Oriented Year: 2022 Month: August Day of Week: Incorrect Memory: Impaired Immediate Memory Recall: Sock;Blue;Bed Memory Recall Sock: With Cue Memory Recall Blue: Without Cue Memory Recall Bed: With Cue Problem Solving: Impaired Problem Solving Impairment: Verbal basic;Functional basic Safety/Judgment: Appears intact Sensation Sensation Light Touch: Impaired Detail Light Touch Impaired Details: Impaired RLE Hot/Cold: Not tested Proprioception: Impaired by gross assessment Stereognosis: Impaired by gross assessment Additional Comments: Endorses R foot/RLE N/T, able to correctly identify LT to RUE/RLE digits Coordination Gross Motor Movements are Fluid and Coordinated: No Fine Motor Movements are Fluid and Coordinated:  No Coordination and Movement Description: R hemi Finger Nose Finger Test: Able to complete at slowed speed with use of R fist, unable to isolate digits to complete Motor  Motor Motor: Hemiplegia Motor - Skilled Clinical Observations: R hemi  Trunk/Postural Assessment  Cervical Assessment Cervical Assessment: Exceptions to [facility] (forward head) Thoracic Assessment Thoracic Assessment: Exceptions to [facility] (rounded shoulders) Lumbar Assessment Lumbar Assessment: Exceptions to [facility] (posterior pelvic tilt) Postural Control Postural Control: Deficits on evaluation (posterior/L lean while seated with feed unsupported)  Balance Balance Balance Assessed: Yes Static Sitting Balance Static Sitting - Balance Support: Feet supported Static Sitting - Level of Assistance: 5: Stand by assistance Static Sitting - Comment/# of Minutes: CGA Dynamic Sitting Balance Dynamic Sitting - Balance Support: Feet unsupported Dynamic Sitting - Level of Assistance: 4: Min assist;3: Mod assist Dynamic Sitting - Balance Activities: Forward  lean/weight shifting;Reaching across midline;Reaching for objects Static Standing Balance Static Standing - Balance Support: Bilateral upper extremity supported Static Standing - Level of Assistance: 5: Stand by assistance;Other (comment) (stedy) Extremity/Trunk Assessment RUE Assessment RUE Assessment: Exceptions to [facility] Active Range of Motion (AROM) Comments: 3/4 to full shoulder flexion General Strength Comments: 4-/5 in shoulder flexion, no active digit flexion RUE Body System: Neuro Brunstrum levels for arm and hand: Arm;Hand Brunstrum level for arm: Stage IV Movement is deviating from synergy Brunstrum level for hand: Stage I Flaccidity;Stage II Synergy is developing LUE Assessment LUE Assessment: Within Functional Limits  Care Tool Care Tool Self Care Eating   Eating Assist Level: Set up assist    Oral Care    Oral Care Assist Level: Supervision/Verbal cueing     Bathing   Body parts bathed by patient: Right arm;Face;Chest;Abdomen;Front perineal area;Right upper leg;Left upper leg Body parts bathed by helper: Left arm;Buttocks;Right lower leg;Left lower leg   Assist Level: Dependent - Patient 0% (stedy for STS)    Upper Body Dressing(including orthotics)   What is the patient wearing?: Pull over shirt   Assist Level: Moderate Assistance - Patient 50 - 74%    Lower Body Dressing (excluding footwear)   What is the patient wearing?: Incontinence brief;Pants Assist for lower body dressing: Dependent - Patient 0% (stedy)    Putting on/Taking off footwear   What is the patient wearing?: Non-skid slipper socks Assist for footwear: Total Assistance - Patient < 25%       Care Tool Toileting Toileting activity   Assist for toileting: Dependent - Patient 0% (stedy)     Care Tool Bed Mobility Roll left and right activity   Roll left and right assist level: Moderate Assistance - Patient 50 - 74%    Sit to lying activity   Sit to lying assist level: Minimal Assistance - Patient > 75%    Lying to sitting on side of bed activity   Lying to sitting on side of bed assist level: the ability to move from lying on the back to sitting on the side of the bed with no back support.: Moderate Assistance - Patient 50 - 74%     Care Tool Transfers Sit to stand transfer   Sit to stand assist level: Dependent - Patient 0% (stedy)    Chair/bed transfer   Chair/bed transfer assist level: Dependent - mechanical lift (stedy)     Toilet transfer   Assist Level: Dependent - Patient 0% (stedy)     Care Tool Cognition  Expression of Ideas and Wants Expression of Ideas and Wants: 4. Without difficulty (complex and basic) - expresses complex messages without difficulty and with speech that is clear and easy to understand  Understanding Verbal and Non-Verbal Content Understanding Verbal and Non-Verbal Content: 4. Understands (complex and basic) - clear  comprehension without cues or repetitions   Memory/Recall Ability Memory/Recall Ability : Current season;That he or she is in a hospital/hospital unit   Refer to Care Plan for [place] 1 OT Short Term Goal 1 (Week 1): Pt will completed BSC/toilet transfer with max A via LRAD. OT Short Term Goal 2 (Week 1): Pt will don pants with max A + AE PRN. OT Short Term Goal 3 (Week 1): Pt will complete STS in prep for standing ADL with max A and no mechanical lift. OT Short Term Goal 4 (Week 1): Pt will completed seated grooming task with distant S.  Recommendations for  other services: None    Skilled Therapeutic Intervention ADL ADL Eating: Set up Where Assessed-Eating: Bed level Grooming: Supervision/safety Where Assessed-Grooming: Sitting at sink Upper Body Bathing: Minimal assistance Where Assessed-Upper Body Bathing: Sitting at sink Lower Body Bathing: Dependent (stedy) Where Assessed-Lower Body Bathing: Standing at sink Upper Body Dressing: Moderate assistance Where Assessed-Upper Body Dressing: Edge of bed Lower Body Dressing: Dependent Where Assessed-Lower Body Dressing: Bed level;Edge of bed Toileting: Dependent Where Assessed-Toileting: Glass blower/designer: Dependent (stedy) Toilet Transfer Method: Other (comment) (stedy) Toilet Transfer Equipment: Radiographer, therapeutic: Not assessed Social research officer, government: Not assessed Mobility  Bed Mobility Bed Mobility: Rolling Right;Rolling Left;Sit to Supine;Supine to Sit Rolling Right: Minimal Assistance - Patient > 75% Rolling Left: Moderate Assistance - Patient 50-74% Supine to Sit: Moderate Assistance - Patient 50-74% Sit to Supine: Minimal Assistance - Patient > 75% Transfers Sit to Stand: Dependent - mechanical lift;Moderate Assistance - Patient 50-74% (stedy)  Session Eval: Pt received semi-reclined in bed, no c/o pain initially, agreeable to OT eval. In person interpreter Mickel Baas  present throughout session. Reviewed role of CIR OT, evaluation process, ADL/func mobility retraining, goals for therapy, and safety plan. Evaluation completed as documented above. Came to sitting EOB with mod A to lift trunk. Maintains static sitting balance with time with close S and B feet support. Req to use bathroom. Donned pants with total A and shirt with mod A to thread RUE + pull shirt down over trunk. Attempted several STS from elevated surface, but pt unable to clear glutteal surface. Mod A to power up in stedy > transfer to toilet. Total A for toileting tasks post continent void of b/b. In perched position, completed oral care with close S and set-up A of tooth brush, demonstrated good trunk control. Notified NT of stedy transfer rec for toileting. Pt satO2 at 91% on 4 L of O2 via [place] before activity, at 95% post activity.  Pt left seated in w/c with RN/interpreter present.  Discharge Criteria: Patient will be discharged from OT if patient refuses treatment 3 consecutive times without medical reason, if treatment goals not met, if there is a change in medical status, if patient makes no progress towards goals or if patient is discharged from hospital.  The above assessment, treatment plan, treatment alternatives and goals were discussed and mutually agreed upon: by patient  [name] MS, OTR/L  07/27/2021, 10:00 AM

## 2021-07-27 NOTE — H&P (Signed)
Physical Medicine and Rehabilitation Admission H&P        Chief Complaint  Patient presents with   Functional deficits due to stroke      HPI:  Roberto Dickson is a 80 year old RH- male ([place]) with history of HTN otherwise in good health with mild HTN per pt, on meds; who was admitted on 07/19/21 with RUE weakness, left gaze deviation and inability to speak. 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 He  underwent cerebral angio with revascularization of L-ICA from proximal to distal terminus and origin of L-MCA by Dr. Corliss Skainseveshwar. MRI/MRA brain showed scattered acute infarcts in left cerebellum and both cerebral hemispheres --largest in left posterior frontal region c/w cardiac or aortic source. 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 Follow up ultrasound showed pseudoaneurysm resolve with 2.3 X 3.0 right groin hematoma. He was noted to have rise in WBC to 17.2 with drop in H/H and low grade fevers as well as tachypnea with hypoxia requiring supplemental oxygen.  He was started on IV rocephin due to concerns of UTI.  Fevers have resolved but he continues to be limited by balance deficits with posterior lean, right knee instability, RUE weakness, has difficulty feeding self and performing ADL tasks. CIR recommended due to functional decline.       Pt reports LBM was 8 days ago- denies SOB or aphasia. Poor appetite.    Also c/o RLE jumping/moving on its own, but not painful.    Review of Systems  Constitutional:  Negative for chills and fever.  HENT:  Positive for hearing loss.   Eyes:  Negative for blurred vision and double vision.  Respiratory:  Negative for cough and  shortness of breath.   Cardiovascular:  Negative for chest pain and leg swelling.  Gastrointestinal:  Positive for constipation (no BM for 8 days).       No appetite  Genitourinary:        Foley in place  Musculoskeletal:  Positive for myalgias (indicating that legs/thighs hurt).  Neurological:  Positive for weakness.  All other systems reviewed and are negative.         Past Medical History:  Diagnosis Date   Hypertension             Past Surgical History:  Procedure Laterality Date   APPENDECTOMY        in his 7320's   IR CT HEAD LTD   07/19/2021   IR FLUORO GUIDED NEEDLE PLC ASPIRATION/INJECTION LOC   07/21/2021   IR PERCUTANEOUS ART THROMBECTOMY/INFUSION INTRACRANIAL INC DIAG ANGIO   07/19/2021   RADIOLOGY WITH ANESTHESIA N/A 07/19/2021    Procedure: IR WITH ANESTHESIA;  Surgeon: Julieanne Cottoneveshwar, Sanjeev, MD;  Location: [facility] OR;  Service: Radiology;  Laterality: N/A;           Family History  Problem Relation Age of Onset   Cancer - Prostate Father        Social History:  Married. Lives with wife--lived in [place] 20 years then moved to [place] and to [place] 3 yrs ago.  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 He has never used smokeless tobacco. He reports current alcohol  occasionally w/ party"". No history on file for drug use.     Allergies: No Known Allergies           Medications Prior to Admission  Medication Sig Dispense Refill   enalapril (VASOTEC) 20 MG tablet Take 20 mg by mouth daily.       hydrochlorothiazide (HYDRODIURIL) 12.5 MG tablet Take 12.5 mg by mouth daily.       ibuprofen (ADVIL) 200 MG tablet Take 200 mg by mouth every 6 (six) hours as needed for mild pain.          Drug Regimen Review  Drug regimen was reviewed and remains appropriate with no significant issues identified   Home: Home Living Family/patient expects to be discharged to:: Private residence Living Arrangements: Spouse/significant other Available Help at  Discharge: Family, Available 24 hours/day Type of Home: Apartment Home Access: Elevator Home Layout: One level Bathroom Shower/Tub: Health visitor: Handicapped height Bathroom Accessibility: Yes Home Equipment: Grab bars - tub/shower  Lives With: Spouse   Functional History: Prior Function Level of Independence: Independent Comments: pt can understand some english but speaks spanish. pt asked color of item and states "red"   Functional Status:  Mobility: Bed Mobility Overal bed mobility: Needs Assistance Bed Mobility: Rolling, Supine to Sit Rolling: +2 for physical assistance, Mod assist Supine to sit: +2 for physical assistance, Mod assist Sit to supine: Mod assist General bed mobility comments: pt able to progress bil LE toward EOB with (A) to place off  bed. pt reaching with R UE toward bed rail with decreased shoulder flexion, needing hand-over-hand to direct it to rail. pt pushing up with L UE to come to static sitting. Transfers Overall transfer level: Needs assistance Equipment used: Rolling walker (2 wheeled) Transfers: Sit to/from Stand, Stand Pivot Transfers Sit to Stand: +2 physical assistance, Max assist, Mod assist Stand pivot transfers: +2 physical assistance, Mod assist General transfer comment: ModAx2 to power up to stand and steady, no sliding of feet this date. ModAx2 to steady with pt taking a little time to get steady with RW.  Pt then able to take pivotal steps to chair, did not note R knee instability. Asked pt to walk forward and back and pt was able to - see belwo. Ambulation/Gait Ambulation/Gait assistance: Mod assist, +2 physical assistance Gait Distance (Feet): 5 Feet Assistive device: Rolling walker (2 wheeled) Gait Pattern/deviations: Decreased stride length, Decreased step length - right, Decreased dorsiflexion - right, Narrow base of support, Decreased step length - left General Gait Details: Pt was able to take steps forward and then  back with mod cues and min to mod assist for stability with use of RW. Pt needed cues to take larger steps as he was taking small steps unless cued. Was able to move his right LE on his own. Gait velocity: reduced Gait velocity interpretation: <1.31 ft/sec, indicative of household ambulator   ADL: ADL Overall ADL's : Needs assistance/impaired Eating/Feeding: Maximal assistance Eating/Feeding Details (indicate cue type and reason): pt will need AE to help with self feeding but he has elbow flexion Grooming: Maximal assistance Grooming Details (indicate cue type and reason): hand over hadn to wash face Toilet Transfer: +2 for physical assistance, Moderate assistance, Stand-pivot Toilet Transfer Details (indicate cue type and reason): simulated eob to chair General ADL Comments: pt able to progress to chair sitting. pt without chair alarm as unit no alarms at this time. Secretary working on obtaining. Wife and RN made aware in addition to  patient fall risk . pt reading spanish sign for calling for RN and not falling to use the call bell.   Cognition: Cognition Overall Cognitive Status:  (to be further assessed for higher executive function. delayed responses to some questions) Orientation Level: Oriented X4 Cognition Arousal/Alertness: Awake/alert Behavior During Therapy: Flat affect Overall Cognitive Status:  (to be further assessed for higher executive function. delayed responses to some questions) Area of Impairment: Following commands, Safety/judgement, Awareness, Problem solving Following Commands: Follows one step commands inconsistently, Follows one step commands with increased time Safety/Judgement: Decreased awareness of safety, Decreased awareness of deficits Awareness: Emergent Problem Solving: Slow processing, Difficulty sequencing, Requires verbal cues, Requires tactile cues General Comments: pt answering questions with delay at times. pt able to follow 2 step  commands. Difficult to assess due to: Non-English speaking (Spanish)     Blood pressure 107/82, pulse 100, temperature 99.1 F (37.3 C), temperature source Oral, resp. rate 19, weight 90.5 kg, SpO2 97 %. Physical Exam Vitals and nursing note reviewed.  Constitutional:      Appearance: Normal appearance.     Comments: Elderly male sitting up in bed; used tele-interpretor to interview/examine; NAD  HENT:     Head: Normocephalic and atraumatic.     Comments: Smile equal at rest/tongue midline    Nose: Nose normal. No congestion.     Mouth/Throat:     Mouth: Mucous membranes are dry.     Pharynx: Oropharynx is clear. No oropharyngeal exudate.  Eyes:     General:        Right eye: No discharge.        Left eye: No discharge.     Extraocular Movements: Extraocular movements intact.  Cardiovascular:     Rate and Rhythm: Normal rate and regular rhythm.     Heart sounds: Normal heart sounds. No murmur heard.   No gallop.  Pulmonary:     Breath sounds: Wheezing (occasional audible sounds) present.     Comments: Few scattered wheezes, but otherwise good air movement and no R/R- denies SOB, even without O2 Abdominal:     Palpations: Abdomen is soft.     Comments: Protuberant/distended; soft; hyperactive BS  Musculoskeletal:        General: Tenderness (right knee with ROM and mild effusion noted) present.     Cervical back: Normal range of motion. No rigidity.     Comments: RUE- biceps 4-/5. Triceps 4-/5, WE 2+/5, grip 2-/5, and FA 2-/5 LUE 5/5 in same muscles RLE- HF 4-/5, KE 3/5, DF 3+/5 and PF 3+/5 LLE- 5/5 in same muscles  Skin:    Comments: Right flank hematoma extending to RLQ and bilateral inner thighs.  Very purple and line drawn around it, shows it's stable size vs slightly smaller- TTP- no scrotal swelling  Neurological:     Mental Status: He is alert and oriented to person, place, and time.     Comments: Question mild hearing loss. Mild dysarthria noted. He was able to  follow simple motor commands and answer biographic questions appropriately with use of [employer] interpreter.  No hoffman's; no clonus- no increased tone seen on exam.  Decreased to light touch on R side of face as well as R side of body compared to L side. No aphasia  Psychiatric:        Mood and Affect: Mood normal.        Behavior: Behavior normal.      Lab Results Last 48 Hours  Results for orders placed or performed during the hospital encounter of 07/19/21 (from the past 48 hour(s))  Protime-INR     Status: Abnormal    Collection Time: 07/25/21  4:43 AM  Result Value Ref Range    Prothrombin Time 16.6 (H) 11.4 - 15.2 seconds    INR 1.3 (H) 0.8 - 1.2      Comment: (NOTE) INR goal varies based on device and disease states. Performed at [facility], [address] [place], [place] [address]    CBC     Status: Abnormal    Collection Time: 07/25/21  4:43 AM  Result Value Ref Range    WBC 15.1 (H) 4.0 - 10.5 K/uL    RBC 3.36 (L) 4.22 - 5.81 MIL/uL    Hemoglobin 10.7 (L) 13.0 - 17.0 g/dL    HCT 82.9 (L) 56.2 - 52.0 %    MCV 94.3 80.0 - 100.0 fL    MCH 31.8 26.0 - 34.0 pg    MCHC 33.8 30.0 - 36.0 g/dL    RDW 13.0 86.5 - 78.4 %    Platelets 214 150 - 400 K/uL    nRBC 0.0 0.0 - 0.2 %      Comment: Performed at [facility], [address] [place], [place] [address]  Basic metabolic panel     Status: Abnormal    Collection Time: 07/25/21  4:43 AM  Result Value Ref Range    Sodium 132 (L) 135 - 145 mmol/L    Potassium 3.7 3.5 - 5.1 mmol/L    Chloride 102 98 - 111 mmol/L    CO2 19 (L) 22 - 32 mmol/L    Glucose, Bld 101 (H) 70 - 99 mg/dL      Comment: Glucose reference range applies only to samples taken after fasting for at least 8 hours.    BUN 25 (H) 8 - 23 mg/dL    Creatinine, Ser 5.28 (H) 0.61 - 1.24 mg/dL    Calcium 7.9 (L) 8.9 - 10.3 mg/dL    GFR, Estimated 46 (L) >60 mL/min      Comment: (NOTE) Calculated using the CKD-EPI Creatinine Equation  (2021)      Anion gap 11 5 - 15      Comment: Performed at [facility], [address] [place], [place] [address]  Protime-INR     Status: Abnormal    Collection Time: 07/26/21  1:31 AM  Result Value Ref Range    Prothrombin Time 20.3 (H) 11.4 - 15.2 seconds    INR 1.7 (H) 0.8 - 1.2      Comment: (NOTE) INR goal varies based on device and disease states. Performed at [facility], [address] [place], [place] [address]    CBC     Status: Abnormal    Collection Time: 07/26/21  1:31 AM  Result Value Ref Range    WBC 12.4 (H) 4.0 - 10.5 K/uL    RBC 3.31 (L) 4.22 - 5.81 MIL/uL    Hemoglobin 10.6 (L) 13.0 - 17.0 g/dL    HCT 72.5 (L) 36.6 - 52.0 %    MCV 93.1 80.0 - 100.0 fL    MCH 32.0 26.0 - 34.0 pg    MCHC 34.4 30.0 - 36.0 g/dL    RDW 44.0 34.7 - 42.5 %    Platelets 254 150 - 400 K/uL    nRBC 0.0 0.0 - 0.2 %      Comment: Performed  at [facility], [address], [place], [place] [address]  Basic metabolic panel     Status: Abnormal    Collection Time: 07/26/21  1:31 AM  Result Value Ref Range    Sodium 132 (L) 135 - 145 mmol/L    Potassium 3.8 3.5 - 5.1 mmol/L    Chloride 103 98 - 111 mmol/L    CO2 21 (L) 22 - 32 mmol/L    Glucose, Bld 105 (H) 70 - 99 mg/dL      Comment: Glucose reference range applies only to samples taken after fasting for at least 8 hours.    BUN 29 (H) 8 - 23 mg/dL    Creatinine, Ser 2.58 (H) 0.61 - 1.24 mg/dL    Calcium 7.7 (L) 8.9 - 10.3 mg/dL    GFR, Estimated 43 (L) >60 mL/min      Comment: (NOTE) Calculated using the CKD-EPI Creatinine Equation (2021)      Anion gap 8 5 - 15      Comment: Performed at [facility], [address] [place], [place] [address]      Imaging Results (Last 48 hours)  No results found.           Medical Problem List and Plan: 1.  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  LV  thrombusAntithrombotics: -DVT/anticoagulation:  Pharmaceutical: Lovenox treatment dose bridge to coumadin.              -antiplatelet therapy: N/A 3. Pain Management: tylenol prn for thigh/groin pain.  4. Mood: LCSW to follow for evaluation and support.              -antipsychotic agents: N/A 5. Neuropsych: This patient may be capable of making decisions on his own behalf. 6. Skin/Wound Care: Routine pressure relief measures.  7. Fluids/Electrolytes/Nutrition: Monitor I/O. Check lytes in am.  8. HTN: Monitor BP TID--was taking BP meds daily (has online provider) --Continue metoprolol 9. Acute on chronic renal failure?: Likely due to untreated HTN.             --BUN/SCr trending up--> 25/1.53 at admission -->29/1.63             --monitor weekly. Recheck BMET in am.             --Encourage fluid intake.  10. Leucocytosis: Likely due to pyuria (UCS negative)  --Treated with Rocephin D#4/5 11. Dyslipidemia: LDL-132. On Lipitor.  12. Hyponatremia:  Na trending down-->recheck in am.  13. Hypocalcemia: Ionized calcium 1.11 -->add calcium supplement.  14. Pre-diabetes: Hgb A1C-5.9.  13. Anorexia/Constipation-  Has not had a BM in 8 days. Ducolax tabs tonight (used PTA) with enema in am if no results.  --Needs assistance with meals.   14. BLE spasms? RLS?: Reports that legs "jump at any moment"- since mainly RLE- could be minimal signs of spasticity?             --monitor for now.  15. ABLA/Right groin hematoma: Continue to trend H/H with AC on board --Admission hgb 15.8-->10.6.        Jacquelynn Cree, PA-C 07/26/2021    I have personally performed a face to face diagnostic evaluation of this patient and formulated the key components of the plan.  Additionally, I have  personally reviewed laboratory data, imaging studies, as well as relevant notes and concur with the physician assistant's documentation above.   The patient's status has not changed from the original H&P.  Any changes in  documentation from the acute care chart have been noted above.

## 2021-07-27 NOTE — Plan of Care (Signed)
Problem: RH Balance Goal: LTG: Patient will maintain dynamic sitting balance (OT) Description: LTG:  Patient will maintain dynamic sitting balance with assistance during activities of daily living (OT) Flowsheets (Taken 07/27/2021 0928) LTG: Pt will maintain dynamic sitting balance during ADLs with: Supervision/Verbal cueing Goal: LTG Patient will maintain dynamic standing with ADLs (OT) Description: LTG:  Patient will maintain dynamic standing balance with assist during activities of daily living (OT)  Flowsheets (Taken 07/27/2021 0928) LTG: Pt will maintain dynamic standing balance during ADLs with: Contact Guard/Touching assist   Problem: Sit to Stand Goal: LTG:  Patient will perform sit to stand in prep for activites of daily living with assistance level (OT) Description: LTG:  Patient will perform sit to stand in prep for activites of daily living with assistance level (OT) Flowsheets (Taken 07/27/2021 0928) LTG: PT will perform sit to stand in prep for activites of daily living with assistance level: Contact Guard/Touching assist   Problem: RH Eating Goal: LTG Patient will perform eating w/assist, cues/equip (OT) Description: LTG: Patient will perform eating with assist, with/without cues using equipment (OT) Flowsheets (Taken 07/27/2021 0928) LTG: Pt will perform eating with assistance level of: Independent with assistive device    Problem: RH Grooming Goal: LTG Patient will perform grooming w/assist,cues/equip (OT) Description: LTG: Patient will perform grooming with assist, with/without cues using equipment (OT) Flowsheets (Taken 07/27/2021 0928) LTG: Pt will perform grooming with assistance level of: Supervision/Verbal cueing   Problem: RH Bathing Goal: LTG Patient will bathe all body parts with assist levels (OT) Description: LTG: Patient will bathe all body parts with assist levels (OT) Flowsheets (Taken 07/27/2021 0928) LTG: Pt will perform bathing with assistance  level/cueing: Minimal Assistance - Patient > 75%   Problem: RH Dressing Goal: LTG Patient will perform upper body dressing (OT) Description: LTG Patient will perform upper body dressing with assist, with/without cues (OT). Flowsheets (Taken 07/27/2021 [phone]) LTG: Pt will perform upper body dressing with assistance level of: Supervision/Verbal cueing Goal: LTG Patient will perform lower body dressing w/assist (OT) Description: LTG: Patient will perform lower body dressing with assist, with/without cues in positioning using equipment (OT) Flowsheets (Taken 07/27/2021 0928) LTG: Pt will perform lower body dressing with assistance level of: Minimal Assistance - Patient > 75%   Problem: RH Toileting Goal: LTG Patient will perform toileting task (3/3 steps) with assistance level (OT) Description: LTG: Patient will perform toileting task (3/3 steps) with assistance level (OT)  Flowsheets (Taken 07/27/2021 0928) LTG: Pt will perform toileting task (3/3 steps) with assistance level: Minimal Assistance - Patient > 75%   Problem: RH Functional Use of Upper Extremity Goal: LTG Patient will use RT/LT upper extremity as a (OT) Description: LTG: Patient will use right/left upper extremity as a stabilizer/gross assist/diminished/nondominant/dominant level with assist, with/without cues during functional activity (OT) Flowsheets (Taken 07/27/2021 0928) LTG: Use of upper extremity in functional activities: RUE as diminished level LTG: Pt will use upper extremity in functional activity with assistance level of: Supervision/Verbal cueing   Problem: RH Toilet Transfers Goal: LTG Patient will perform toilet transfers w/assist (OT) Description: LTG: Patient will perform toilet transfers with assist, with/without cues using equipment (OT) Flowsheets (Taken 07/27/2021 0928) LTG: Pt will perform toilet transfers with assistance level of: Contact Guard/Touching assist   Problem: RH Tub/Shower Transfers Goal: LTG  Patient will perform tub/shower transfers w/assist (OT) Description: LTG: Patient will perform tub/shower transfers with assist, with/without cues using equipment (OT) Flowsheets (Taken 07/27/2021 0928) LTG: Pt will perform tub/shower stall transfers with assistance  level of: Contact Guard/Touching assist

## 2021-07-27 NOTE — Discharge Instructions (Addendum)
Inpatient Rehab Discharge Instructions  Roberto Dickson Discharge date and time:  08/10/21  Activities/Precautions/ Functional Status: Activity: no lifting, driving, or strenuous exercise till cleared by MD Diet: cardiac diet and diabetic diet Wound Care: keep wound clean and dry   Functional status:  ___ No restrictions     ___ Walk up steps independently _X__ 24/7 supervision/assistance   ___ Walk up steps with assistance ___ Intermittent supervision/assistance  ___ Bathe/dress independently ___ Walk with walker     __X_ Bathe/dress with assistance ___ Walk Independently    ___ Shower independently ___ Walk with assistance    ___ Shower with assistance _X__ No alcohol     ___ Return to work/school ________    COMMUNITY REFERRALS UPON DISCHARGE:    Home Health:   PT     OT                    Agency: [facility] Phone: [phone]   Medical Equipment/Items Ordered:                                                 Agency/Supplier: [employer], Bedside Commode, Shower Chair   Special Instructions: New Patient Visit with [name], MD on Wednesday November 09, 2021 2:00 PM      STROKE/TIA DISCHARGE INSTRUCTIONS SMOKING Cigarette smoking nearly doubles your risk of having a stroke & is the single most alterable risk factor  If you smoke or have smoked in the last 12 months, you are advised to quit smoking for your health. Most of the excess cardiovascular risk related to smoking disappears within a year of stopping. Ask you doctor about anti-smoking medications [place] Quit Line: 1-800-QUIT NOW Free Smoking Cessation Classes [phone]  CHOLESTEROL Know your levels; limit fat & cholesterol in your diet  Lipid Panel     Component Value Date/Time   CHOL 179 07/20/2021 0536   TRIG 89 07/21/2021 0432   HDL 30 (L) 07/20/2021 0536   CHOLHDL 6.0 07/20/2021 0536   VLDL 17 07/20/2021 0536   LDLCALC 132 (H) 07/20/2021 0536     Many patients benefit from treatment  even if their cholesterol is at goal. Goal: Total Cholesterol (CHOL) less than 160 Goal:  Triglycerides (TRIG) less than 150 Goal:  HDL greater than 40 Goal:  LDL (LDLCALC) less than 100   BLOOD PRESSURE American Stroke Association blood pressure target is less that 120/80 mm/Hg  Your discharge blood pressure is:  BP: 137/81 Monitor your blood pressure Limit your salt and alcohol intake Many individuals will require more than one medication for high blood pressure  DIABETES (A1c is a blood sugar average for last 3 months) Goal HGBA1c is under 7% (HBGA1c is blood sugar average for last 3 months)  Diabetes: Pre-diabetes    Lab Results  Component Value Date   HGBA1C 5.9 (H) 07/20/2021    Your HGBA1c can be lowered with medications, healthy diet, and exercise. Check your blood sugar as directed by your physician Call your physician if you experience unexplained or low blood sugars.  PHYSICAL ACTIVITY/REHABILITATION Goal is 30 minutes at least 4 days per week  Activity: Increase activity slowly, and No driving, Therapies: see above Return to work: N/A Activity decreases your risk of heart attack and stroke and makes your heart stronger.  It helps control your weight and  blood pressure; helps you relax and can improve your mood. Participate in a regular exercise program. Talk with your doctor about the best form of exercise for you (dancing, walking, swimming, cycling).  DIET/WEIGHT Goal is to maintain a healthy weight  Your discharge diet is:  Diet Order             Diet Heart Room service appropriate? Yes; Fluid consistency: Thin  Diet effective now                  thin liquids Your height is:  Height: 5\' 6"  (167.6 cm) Your current weight is: Weight: 87.7 kg Your Body Mass Index (BMI) is:  BMI (Calculated): 31.22 Following the type of diet specifically designed for you will help prevent another stroke. Your goal weight  is:  155 lbs Your goal Body Mass Index (BMI) is  19-24. Healthy food habits can help reduce 3 risk factors for stroke:  High cholesterol, hypertension, and excess weight.  RESOURCES Stroke/Support Group:  Call [phone]   STROKE EDUCATION PROVIDED/REVIEWED AND GIVEN TO PATIENT Stroke warning signs and symptoms How to activate emergency medical system (call 911). Medications prescribed at discharge. Need for follow-up after discharge. Personal risk factors for stroke. Pneumonia vaccine given:  Flu vaccine given:  My questions have been answered, the writing is legible, and I understand these instructions.  I will adhere to these goals & educational materials that have been provided to me after my discharge from the hospital.       My questions have been answered and I understand these instructions. I will adhere to these goals and the provided educational materials after my discharge from the hospital.  Patient/Caregiver Signature _______________________________ Date __________  Clinician Signature _______________________________________ Date __________  Please bring this form and your medication list with you to all your follow-up doctor's appointments.    Information on my medicine - Coumadin   (Warfarin)  Why was Coumadin prescribed for you? Coumadin was prescribed for you because you have a blood clot or a medical condition that can cause an increased risk of forming blood clots. Blood clots can cause serious health problems by blocking the flow of blood to the heart, lung, or brain. Coumadin can prevent harmful blood clots from forming. As a reminder your indication for Coumadin is:  Stroke Prevention because of Atrial Fibrillation  What test will check on my response to Coumadin? While on Coumadin (warfarin) you will need to have an INR test regularly to ensure that your dose is keeping you in the desired range. The INR (international normalized ratio) number is calculated from the result of the laboratory test called  prothrombin time (PT).  If an INR APPOINTMENT HAS NOT ALREADY BEEN MADE FOR YOU please schedule an appointment to have this lab work done by your health care provider within 7 days. Your INR goal is usually a number between:  2 to 3 or your provider may give you a more narrow range like 2-2.5.  Ask your health care provider during an office visit what your goal INR is.  What  do you need to  know  About  COUMADIN? Take Coumadin (warfarin) exactly as prescribed by your healthcare provider about the same time each day.  DO NOT stop taking without talking to the doctor who prescribed the medication.  Stopping without other blood clot prevention medication to take the place of Coumadin may increase your risk of developing a new clot or stroke.  Get refills before you  run out.  What do you do if you miss a dose? If you miss a dose, take it as soon as you remember on the same day then continue your regularly scheduled regimen the next day.  Do not take two doses of Coumadin at the same time.  Important Safety Information A possible side effect of Coumadin (Warfarin) is an increased risk of bleeding. You should call your healthcare provider right away if you experience any of the following: Bleeding from an injury or your nose that does not stop. Unusual colored urine (red or dark brown) or unusual colored stools (red or black). Unusual bruising for unknown reasons. A serious fall or if you hit your head (even if there is no bleeding).  Some foods or medicines interact with Coumadin (warfarin) and might alter your response to warfarin. To help avoid this: Eat a balanced diet, maintaining a consistent amount of Vitamin K. Notify your provider about major diet changes you plan to make. Avoid alcohol or limit your intake to 1 drink for women and 2 drinks for men per day. (1 drink is 5 oz. wine, 12 oz. beer, or 1.5 oz. liquor.)  Make sure that ANY health care provider who prescribes medication for you  knows that you are taking Coumadin (warfarin).  Also make sure the healthcare provider who is monitoring your Coumadin knows when you have started a new medication including herbals and non-prescription products.  Coumadin (Warfarin)  Major Drug Interactions  Increased Warfarin Effect Decreased Warfarin Effect  Alcohol (large quantities) Antibiotics (esp. Septra/Bactrim, Flagyl, Cipro) Amiodarone (Cordarone) Aspirin (ASA) Cimetidine (Tagamet) Megestrol (Megace) NSAIDs (ibuprofen, naproxen, etc.) Piroxicam (Feldene) Propafenone (Rythmol SR) Propranolol (Inderal) Isoniazid (INH) Posaconazole (Noxafil) Barbiturates (Phenobarbital) Carbamazepine (Tegretol) Chlordiazepoxide (Librium) Cholestyramine (Questran) Griseofulvin Oral Contraceptives Rifampin Sucralfate (Carafate) Vitamin K   Coumadin (Warfarin) Major Herbal Interactions  Increased Warfarin Effect Decreased Warfarin Effect  Garlic Ginseng Ginkgo biloba Coenzyme Q10 Green tea St. John's wort    Coumadin (Warfarin) FOOD Interactions  Eat a consistent number of servings per week of foods HIGH in Vitamin K (1 serving =  cup)  Collards (cooked, or boiled & drained) Kale (cooked, or boiled & drained) Mustard greens (cooked, or boiled & drained) Parsley *serving size only =  cup Spinach (cooked, or boiled & drained) Swiss chard (cooked, or boiled & drained) Turnip greens (cooked, or boiled & drained)  Eat a consistent number of servings per week of foods MEDIUM-HIGH in Vitamin K (1 serving = 1 cup)  Asparagus (cooked, or boiled & drained) Broccoli (cooked, boiled & drained, or raw & chopped) Brussel sprouts (cooked, or boiled & drained) *serving size only =  cup Lettuce, raw (green leaf, endive, romaine) Spinach, raw Turnip greens, raw & chopped   These websites have more information on Coumadin (warfarin):  http://www.king-russell.com/; https://www.hines.net/;

## 2021-07-27 NOTE — Patient Care Conference (Signed)
Inpatient RehabilitationTeam Conference and Plan of Care Update Date: 07/27/2021   Time: 10:17 AM    Patient Name: Roberto Dickson      Medical Record Number: [id number]  Date of Birth: 28-Jan-1941 Sex: Male         Room/Bed: 5C08C/5C08C-01 Payor Info: Payor: MEDICARE / Plan: MEDICARE PART A AND B / Product Type: *No Product type* /    Admit Date/Time:  07/26/2021 10:17 PM  Primary Diagnosis:  Acute ischemic left middle cerebral artery (MCA) stroke [facility])  Hospital Problems: Principal Problem:   Acute ischemic left middle cerebral artery (MCA) stroke [facility])    Expected Discharge Date: Expected Discharge Date: 08/18/21  Team Members Present: Physician leading conference: Dr. Sula Soda Social Worker Present: Cecile Sheerer, LCSWA Nurse Present: Chana Bode, RN PT Present: Grier Rocher, PT OT Present: Annye English, OT PPS Coordinator present : Fae Pippin, SLP     Current Status/Progress Goal Weekly Team Focus  Bowel/Bladder             Swallow/Nutrition/ Hydration             ADL's   eval pending  eval pending  eval pending   Mobility             Communication             Safety/Cognition/ Behavioral Observations            Pain             Skin               Discharge Planning:  To be assessed. Per EMR, pt to d/c to home with 24/7 care from his wife. Will confirm no barriers to discharge.   Team Discussion: Patient with right groin hematoma. Bil LE spasms/RLS addressed per MD along with constipation. Receiving Rocephin for UTI via IV through 07/29/21 then po.  Patient on target to meet rehab goals: yes, evals pending  *See Care Plan and progress notes for long and short-term goals.   Revisions to Treatment Plan:    Teaching Needs: Safety, medications, secondary risk management, transfers, etc  Current Barriers to Discharge: Decreased caregiver support, Home enviroment access/layout, and IV antibiotics  Possible Resolutions to  Barriers: Family education     Medical Summary Current Status: right groin hematoma with dropping hemoglobin, bilateral lower extremity spasms vs restless leg syndrome, constipation, hypocalcemia  Barriers to Discharge: Medical stability  Barriers to Discharge Comments: right groin hematoma with dropping hemoglobin, bilateral lower extremity spasms vs restless leg syndrome, constipation, hypocalcemia Possible Resolutions to [employer] Focus: continue to monitor hemoglobin, continue to monitor leg spasms, dulcolax suppository today, start supplement for hypocalcemia   Continued Need for Acute Rehabilitation Level of Care: The patient requires daily medical management by a physician with specialized training in physical medicine and rehabilitation for the following reasons: Direction of a multidisciplinary physical rehabilitation program to maximize functional independence : Yes Medical management of patient stability for increased activity during participation in an intensive rehabilitation regime.: Yes Analysis of laboratory values and/or radiology reports with any subsequent need for medication adjustment and/or medical intervention. : Yes   I attest that I was present, lead the team conference, and concur with the assessment and plan of the team.   Chana Bode B 07/27/2021, 2:09 PM

## 2021-07-27 NOTE — Plan of Care (Cosign Needed)
  Problem: RH Balance Goal: LTG Patient will maintain dynamic sitting balance (PT) Description: LTG:  Patient will maintain dynamic sitting balance with assistance during mobility activities (PT) Flowsheets (Taken 07/27/2021 2020) LTG: Pt will maintain dynamic sitting balance during mobility activities with:: Independent with assistive device  Goal: LTG Patient will maintain dynamic standing balance (PT) Description: LTG:  Patient will maintain dynamic standing balance with assistance during mobility activities (PT) Flowsheets (Taken 07/27/2021 2020) LTG: Pt will maintain dynamic standing balance during mobility activities with:: Supervision/Verbal cueing   Problem: Sit to Stand Goal: LTG:  Patient will perform sit to stand with assistance level (PT) Description: LTG:  Patient will perform sit to stand with assistance level (PT) Flowsheets (Taken 07/27/2021 2020) LTG: PT will perform sit to stand in preparation for functional mobility with assistance level: Supervision/Verbal cueing   Problem: RH Bed Mobility Goal: LTG Patient will perform bed mobility with assist (PT) Description: LTG: Patient will perform bed mobility with assistance, with/without cues (PT). Flowsheets (Taken 07/27/2021 2020) LTG: Pt will perform bed mobility with assistance level of: Supervision/Verbal cueing   Problem: RH Bed to Chair Transfers Goal: LTG Patient will perform bed/chair transfers w/assist (PT) Description: LTG: Patient will perform bed to chair transfers with assistance (PT). Flowsheets (Taken 07/27/2021 2020) LTG: Pt will perform Bed to Chair Transfers with assistance level: Supervision/Verbal cueing   Problem: RH Car Transfers Goal: LTG Patient will perform car transfers with assist (PT) Description: LTG: Patient will perform car transfers with assistance (PT). Flowsheets (Taken 07/27/2021 2020) LTG: Pt will perform car transfers with assist:: Supervision/Verbal cueing   Problem: RH Ambulation Goal:  LTG Patient will ambulate in controlled environment (PT) Description: LTG: Patient will ambulate in a controlled environment, # of feet with assistance (PT). Flowsheets (Taken 07/27/2021 2020) LTG: Pt will ambulate in controlled environ  assist needed:: Contact Guard/Touching assist LTG: Ambulation distance in controlled environment: 100 Goal: LTG Patient will ambulate in home environment (PT) Description: LTG: Patient will ambulate in home environment, # of feet with assistance (PT). Flowsheets (Taken 07/27/2021 2020) LTG: Pt will ambulate in home environ  assist needed:: Contact Guard/Touching assist LTG: Ambulation distance in home environment: 50   Problem: RH Wheelchair Mobility Goal: LTG Patient will propel w/c in controlled environment (PT) Description: LTG: Patient will propel wheelchair in controlled environment, # of feet with assist (PT) Flowsheets (Taken 07/27/2021 2020) LTG: Pt will propel w/c in controlled environ  assist needed:: Supervision/Verbal cueing LTG: Propel w/c distance in controlled environment: 150   Problem: RH Stairs Goal: LTG Patient will ambulate up and down stairs w/assist (PT) Description: LTG: Patient will ambulate up and down # of stairs with assistance (PT) Flowsheets (Taken 07/27/2021 2020) LTG: Pt will ambulate up/down stairs assist needed:: Contact Guard/Touching assist LTG: Pt will  ambulate up and down number of stairs: 4

## 2021-07-27 NOTE — Progress Notes (Addendum)
ANTICOAGULATION CONSULT NOTE   Pharmacy Consult for Heparin to Lovenox and Warfarin Indication:  Apical thrombus (recent stroke)  Labs: Recent Labs    07/24/21 1157 07/25/21 0443 07/25/21 0443 07/26/21 0131 07/27/21 0554  HGB  --  10.7*   < > 10.6* 10.5*  HCT  --  31.7*  --  30.8* 30.9*  PLT  --  214  --  254 272  LABPROT  --  16.6*  --  20.3*  --   INR  --  1.3*  --  1.7*  --   HEPARINUNFRC 0.45  --   --   --   --   CREATININE  --  1.54*  --  1.63* 1.49*   < > = values in this interval not displayed.   Assessment: 54 YOM who presented with an ischemic stroke s/p alteplase at 2000 on 8/16. 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 Currently on warfarin with Lovenox  INR trending up, 1.7 8/23 Scr up to 1.63 today - > watch Lovenox dose CBC stable   Goal of Therapy:  INR 2-3 Anti-Xa level 0.6-1 units/ml 4hrs after LMWH dose given Monitor platelets by anticoagulation protocol: Yes   Plan:  Lovenox 90 mg (1 mg/kg) twice daily)  Warfarin 5 mg po x 1 dose  Daily INR, CBC, s/s bleeding Will plan to stop Lovenox when INR is therapeutic   Thank you Okey Regal, PharmD  Please refer to [facility] for unit-specific pharmacist

## 2021-07-27 NOTE — Evaluation (Signed)
Physical Therapy Assessment and Plan  Patient Details  Name: Roberto Dickson MRN: [id number] Date of Birth: [birth year]  PT Diagnosis: Abnormal posture, Abnormality of gait, Coordination disorder, Difficulty walking, Hemiplegia dominant, Muscle spasms, and Muscle weakness Rehab Potential: Good ELOS: 21-24 days   Today's Date: 07/27/2021 PT Individual Time: 0900-1000; 1330-1430 PT Individual Time Calculation (min): 60 min; 60 mins  Hospital Problem: Principal Problem:   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[facility])   Past Medical History:  Past Medical History:  Diagnosis Date   Hypertension    Past Surgical History:  Past Surgical History:  Procedure Laterality Date   APPENDECTOMY     in his 25's   IR CT HEAD LTD  07/19/2021   IR FLUORO GUIDED NEEDLE PLC ASPIRATION/INJECTION LOC  07/21/2021   IR PERCUTANEOUS ART THROMBECTOMY/INFUSION INTRACRANIAL INC DIAG ANGIO  07/19/2021   RADIOLOGY WITH ANESTHESIA N/A 07/19/2021   Procedure: IR WITH ANESTHESIA;  Surgeon: Luanne Bras, MD;  Location: [facility];  Service: Radiology;  Laterality: N/A;    Assessment & Plan Clinical Impression: HPI:  Roberto Dickson is a 80 year old RH- male ([place]) with history of HTN otherwise in good health with mild HTN per pt, on meds; who was admitted on 07/19/21 with RUE weakness, left gaze deviation and inability to speak. 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 He  underwent cerebral angio with revascularization of L-ICA from proximal to distal terminus and origin of L-MCA by Dr. Estanislado Pandy. MRI/MRA brain showed scattered acute infarcts in left cerebellum and both cerebral hemispheres --largest in left posterior frontal region c/w cardiac or aortic source. 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 Follow up ultrasound showed pseudoaneurysm resolve with 2.3 X 3.0 right groin hematoma. He was noted to have rise in WBC to 17.2 with drop in H/H and low grade fevers as well as tachypnea with hypoxia requiring supplemental oxygen.  He was started on IV rocephin due to concerns of UTI.  Fevers have resolved but he continues to be limited by balance deficits with posterior lean, right knee instability, RUE weakness, has difficulty feeding self and performing ADL tasks. CIR recommended due to functional decline.  Patient transferred to CIR on 07/26/2021 .   Patient currently requires max with mobility secondary to muscle weakness, decreased cardiorespiratoy endurance and decreased oxygen support, impaired timing and sequencing, decreased coordination, and decreased motor planning, and decreased sitting balance, decreased standing balance, decreased postural control, hemiplegia, and decreased balance strategies.  Prior to hospitalization, patient was independent  with mobility and lived with Spouse in a [place] home.  Home access is  Elevator Nurse, children's up to apartment on 2nd floor).  Patient will benefit from skilled PT intervention to maximize safe functional mobility, minimize fall risk, and decrease caregiver burden for planned discharge home with 24 hour supervision.  Anticipate patient will benefit from follow up [place] at discharge.  PT - End of Session Activity Tolerance: Tolerates < 10 min activity with changes in vital signs Endurance Deficit: Yes Endurance Deficit Description: Pt required frequent rest breaks 2/2 fatigue and cuing for pursed lip breathing with nasal canual PT Assessment Rehab Potential (ACUTE/IP ONLY): Good PT Barriers to Discharge: Decreased caregiver  support;New oxygen PT Patient demonstrates impairments in the following area(s): Therapist, art;Behavior;Edema;Endurance;Motor;Nutrition;Pain;Perception;Safety;Sensory PT Transfers Functional Problem(s): Bed Mobility;Bed to Chair;Car;Furniture PT Locomotion Functional Problem(s): Ambulation;Wheelchair Mobility;Stairs PT Plan PT Intensity: Minimum of 1-2 x/day ,45 to 90 minutes PT Frequency: 5 out of 7 days PT Duration Estimated Length of Stay: 21-24 days PT Treatment/Interventions: Ambulation/gait training;Discharge planning;Psychosocial support;Functional mobility training;Therapeutic Activities;Visual/perceptual remediation/compensation;Balance/vestibular training;Disease management/prevention;Neuromuscular re-education;Skin care/wound management;Therapeutic Exercise;Wheelchair propulsion/positioning;Cognitive remediation/compensation;DME/adaptive equipment instruction;Pain management;Splinting/orthotics;UE/LE Strength taining/ROM;Community reintegration;Functional electrical stimulation;Patient/family education;Stair training;UE/LE Coordination activities PT Transfers Anticipated Outcome(s): Supervision PT Locomotion Anticipated Outcome(s): CGA with LRAD PT Recommendation Follow Up Recommendations: Home health PT Patient destination: Home Equipment Recommended: To be determined   PT Evaluation Precautions/Restrictions Precautions Precautions: Fall Precaution Comments: Right hemi, Spanish speaking, Right groin hematoma Restrictions Weight Bearing Restrictions: No General Chart Reviewed: Yes Family/Caregiver Present: No  Pain Pain Assessment Pain Scale: 0-10 Pain Score: 0-No pain Faces Pain Scale: Hurts a little bit Pain Type: Acute pain Pain Location: Leg Pain Orientation: Left;Right Pain Descriptors / Indicators: Aching;Sore (2/2 bruising in groin area) Multiple Pain Sites: Yes 2nd Pain Site Wong-Baker Pain Rating: Hurts a little bit Pain Type: Acute pain Pain Location: Coccyx Pain Descriptors / Indicators: Aching Pain Intervention(s): Repositioned;RN made  aware Pain Interference Pain Interference Pain Effect on Sleep: 2. Occasionally Pain Interference with Therapy Activities: 2. Occasionally Pain Interference with Day-to-Day Activities: 2. Occasionally Home Living/Prior Functioning Home Living Available Help at Discharge: Family;Available 24 hours/day Type of Home: Apartment Home Access: Elevator Nurse, children's up to apartment on 2nd floor) Home Layout: One level Bathroom Shower/Tub: Multimedia programmer: Handicapped height Bathroom Accessibility: Yes  Lives With: Spouse Prior Function Level of Independence: Independent with basic ADLs;Independent with gait;Independent with transfers;Independent with homemaking with ambulation Driving: Yes Vocation: Retired Vision/Perception  Vision - History Ability to See in Adequate Light: 0 Adequate Vision - Assessment Eye Alignment: Within Functional Limits Ocular Range of Motion: Restricted on the right Alignment/Gaze Preference: Other (comment) (mild exotropia of R eye) Tracking/Visual Pursuits: Decreased smoothness of eye movement to RIGHT superior field;Decreased smoothness of eye movement to RIGHT inferior field Saccades: Decreased speed of saccadic movement;Additional eye shifts occurred during testing Convergence: Impaired - to be further tested in functional context Additional Comments: Correctly able to read clock and near text. Perception Perception: Impaired Praxis Praxis: Intact  Cognition Overall Cognitive Status: Within Functional Limits for tasks assessed Arousal/Alertness: Awake/alert Orientation Level: Oriented X4 Year: 2022 Month: August Day of Week: Incorrect Problem Solving: Impaired Problem Solving Impairment: Verbal basic;Functional basic Safety/Judgment: Appears intact Sensation Sensation Light Touch: Impaired Detail Peripheral sensation comments: Pt requires increased time to respond to "when am I touching you" instructions when sensation provided  to RLE. Pt reporting softer feel on RLE. Denies N/T. Pt reports randomized spasm RLE Light Touch Impaired Details: Impaired RLE Proprioception: Impaired by gross assessment Coordination Gross Motor Movements are Fluid and Coordinated: No Fine Motor Movements are Fluid and Coordinated: No Coordination and Movement Description: R hemi Heel Shin Test: Pt unable to complete ROM in test of RLE 2/2 weakness Motor  Motor Motor: Hemiplegia Motor - Skilled Clinical Observations: Right Hemi   Trunk/Postural Assessment  Cervical Assessment Cervical Assessment: Exceptions to [facility] (forward head) Thoracic Assessment Thoracic Assessment: Exceptions to [facility] (rounded shoulders/kyphotic) Lumbar Assessment Lumbar Assessment: Exceptions to [facility] (pelvic tilt) Postural Control Postural Control: Deficits on evaluation (Posterior lean)  Balance Balance Balance Assessed: Yes Static Sitting Balance Static Sitting - Balance Support: Feet supported Static Sitting - Level of Assistance: 5: Stand by  assistance Static Sitting - Comment/# of Minutes: CGA Dynamic Sitting Balance Dynamic Sitting - Balance Support: Feet unsupported Dynamic Sitting - Level of Assistance: 4: Min assist;3: Mod assist Dynamic Sitting - Balance Activities: Forward lean/weight shifting;Reaching across midline;Reaching for objects Static Standing Balance Static Standing - Balance Support: Bilateral upper extremity supported Static Standing - Level of Assistance: 4: Min assist Static Standing - Comment/# of Minutes: Standing with RW BUE supported. MinA for safety/balance Extremity Assessment  RUE Assessment RUE Assessment: Exceptions to [facility] Active Range of Motion (AROM) Comments: 3/4 to full shoulder flexion General Strength Comments: 4-/5 in shoulder flexion, no active digit flexion RUE Body System: Neuro Brunstrum levels for arm and hand: Arm;Hand Brunstrum level for arm: Stage IV Movement is deviating from synergy Brunstrum level  for hand: Stage I Flaccidity;Stage II Synergy is developing LUE Assessment LUE Assessment: Within Functional Limits RLE Assessment RLE Assessment: Exceptions to [facility] RLE Strength Right Hip ABduction: 3-/5 Right Hip ADduction: 3+/5 Right Knee Flexion: 3/5 Right Knee Extension: 3-/5 Right Ankle Dorsiflexion: 3-/5 Right Ankle Plantar Flexion: 3-/5 LLE Assessment LLE Assessment: Exceptions to [facility] LLE Strength Left Hip Flexion: 4-/5 Left Hip ABduction: 4+/5 Left Hip ADduction: 4+/5 Left Knee Flexion: 4+/5 Left Knee Extension: 4+/5 Left Ankle Dorsiflexion: 4+/5 Left Ankle Plantar Flexion: 4+/5  Care Tool Care Tool Bed Mobility Roll left and right activity   Roll left and right assist level: Moderate Assistance - Patient 50 - 74%    Sit to lying activity   Sit to lying assist level: Minimal Assistance - Patient > 75%    Lying to sitting on side of bed activity   Lying to sitting on side of bed assist level: the ability to move from lying on the back to sitting on the side of the bed with no back support.: Moderate Assistance - Patient 50 - 74%     Care Tool Transfers Sit to stand transfer   Sit to stand assist level: 2 Helpers    Chair/bed transfer   Chair/bed transfer assist level: Dependent - mechanical lift Charlaine Dalton)     Toilet transfer   Assist Level: Dependent - Patient 0% (stedy)    Car transfer Car transfer activity did not occur: Safety/medical concerns        Care Tool Locomotion Ambulation   Assist level: Moderate Assistance - Patient 50 - 74% Assistive device: Walker-rolling Max distance: 15  Walk 10 feet activity   Assist level: Moderate Assistance - Patient - 50 - 74% Assistive device: Walker-rolling   Walk 50 feet with 2 turns activity Walk 50 feet with 2 turns activity did not occur: Safety/medical concerns      Walk 150 feet activity Walk 150 feet activity did not occur: Safety/medical concerns      Walk 10 feet on uneven surfaces activity Walk 10  feet on uneven surfaces activity did not occur: Safety/medical concerns      Stairs Stair activity did not occur: Safety/medical concerns        Walk up/down 1 step activity Walk up/down 1 step or curb (drop down) activity did not occur: Safety/medical concerns     Walk up/down 4 steps activity did not occuR: Safety/medical concerns  Walk up/down 4 steps activity      Walk up/down 12 steps activity Walk up/down 12 steps activity did not occur: Safety/medical concerns      Pick up small objects from floor Pick up small object from the floor (from standing position) activity did not occur: Safety/medical concerns  Wheelchair Is the patient using a wheelchair?: No   Wheelchair activity did not occur: N/A      Wheel 50 feet with 2 turns activity  Maximal Assistance - Patient 25 - 49%    Wheel 150 feet activity    Total Assistance - Patient < 25%    Refer to Care Plan for Long Term Goals  SHORT TERM GOAL WEEK 1 PT Short Term Goal 1 (Week 1): Pt will complete bed mobility with MinA PT Short Term Goal 2 (Week 1): Pt will ambulate 27f or greater with ModA to work towards LTG PT Short Term Goal 3 (Week 1): Pt will consistently complete STS with mod A of one PT Short Term Goal 4 (Week 1): Pt will complete transfers with ModA PT Short Term Goal 5 (Week 1): Pt will initiate stair training  Recommendations for other services: None   Skilled Therapeutic Intervention  Session One: Pt received in wheelchair and in person spanish interpreter in room. Pt agreeable to therapy. Pt on 4L via nasal canula, switched from wall source or portable tank. Close to empty O2 tank replaced once in therapy gym.  Evaluation completed (see details above) with patient education regarding purpose of PT evaluation, PT POC and goals, therapy schedule, weekly team meetings, and other CIR information including safety plan and fall risk safety.  Gait training 1x141fwith RW and ModA for steadying, RW  management assist 2/2 weakness and poor grip strength RUE, and verbal cuing for increased step length bilaterally/upright posture. Verbal cuing for pursed lip breathing during activity. +2 close wc follow and management of portable O2 tank.   Heavy ModA +2 with STS to RW and in steady 2/2 decreased motor planning and anterior weight shifting despite mutlimodal cuing.   Dynamic seated/reaching balance with cross body, lateral, and anterior reaching for weight shifting. Pt able to maintain reaching for cones x15 feet support and x10 feet unsupported CGA-intermittent MinA for returning to midline.  SpO2 maintained 96% or greater throughout session on 4L.   Pt in wc with brakes locked, call bell within reach and handoff to RN for seatbelt alarm.   Session Two: Pt received sitting in bed with HOB elevated and eating lunch. [employer] language services utilized in session. Pt requesting to eat lunch.  Therapist returned (start time 1330). Pt agreeable to therapy stating he is feeling fatigued but will try. 4L via nasal canula switched to portable O2 tank. Pt connected to IV.   Supine with HOB elevated to sitting EOB towards right side with Mod assist for trunk and terminal hip transitioning.  STS to RW from elevated surface with heavy ModA to power up 2/2 fatigue and decreased anterior weight shifting. Stand [facility] Heavy ModA for steadying, sequencing BLE and RW.   Once in wheelchair pt states he has had a BM and needs to use commode. ModA +2 to STS to stedy, therapist noted severe incontinence of bowel.  Stedy transfer to commode. Total A for doffing soiled clothing/brief. Pt provided with new brief and gown. Several instance pt needing to have BM bristol stool chart type 6/7 over the course of 1 hour and totalA for hygiene. Pt reports not having have BM in 9 days.  Heavy ModA and Verbal/tactile cuing for hand placement throughout multiple STS in Stedy while on commode  during hygiene.    Pt on commode for continued BM with handoff to RN at the end of session.   Mobility Bed Mobility  Bed Mobility: Rolling Left;Supine to Sit Rolling right: Moderate Assistance - Patient 50-74% Supine to Sit: Moderate Assistance - Patient 50-74% Transfers Transfers: Sit to Stand;Stand to Sit;Transfer via [place] to Stand: Moderate Assistance - Patient 50-74%;2 Helpers Stand to Sit: Moderate Assistance - Patient 50-74% Transfer via Lift Equipment: Probation officer Ambulation: Yes Gait Assistance: Moderate Assistance - Patient 50-74% Gait Distance (Feet): 15 Feet Assistive device: Rolling walker Gait Gait: Yes Gait Pattern: Impaired Gait Pattern: Decreased step length - right;Decreased stance time - right;Decreased dorsiflexion - right;Decreased step length - left Gait velocity: decreased Stairs / Additional Locomotion Stairs: No   Discharge Criteria: Patient will be discharged from PT if patient refuses treatment 3 consecutive times without medical reason, if treatment goals not met, if there is a change in medical status, if patient makes no progress towards goals or if patient is discharged from hospital.  The above assessment, treatment plan, treatment alternatives and goals were discussed and mutually agreed upon: by patient  Henrene Pastor, SPT 07/27/2021, 12:16 PM

## 2021-07-28 DIAGNOSIS — I63512 Cerebral infarction due to unspecified occlusion or stenosis of left middle cerebral artery: Secondary | ICD-10-CM

## 2021-07-28 DIAGNOSIS — I10 Essential (primary) hypertension: Secondary | ICD-10-CM

## 2021-07-28 DIAGNOSIS — D62 Acute posthemorrhagic anemia: Secondary | ICD-10-CM

## 2021-07-28 DIAGNOSIS — R7989 Other specified abnormal findings of blood chemistry: Secondary | ICD-10-CM

## 2021-07-28 LAB — CBC
HCT: 31.3 % — ABNORMAL LOW (ref 39.0–52.0)
Hemoglobin: 10.4 g/dL — ABNORMAL LOW (ref 13.0–17.0)
MCH: 31.3 pg (ref 26.0–34.0)
MCHC: 33.2 g/dL (ref 30.0–36.0)
MCV: 94.3 fL (ref 80.0–100.0)
Platelets: 293 10*3/uL (ref 150–400)
RBC: 3.32 MIL/uL — ABNORMAL LOW (ref 4.22–5.81)
RDW: 14 % (ref 11.5–15.5)
WBC: 11.7 10*3/uL — ABNORMAL HIGH (ref 4.0–10.5)
nRBC: 0 % (ref 0.0–0.2)

## 2021-07-28 LAB — PROTIME-INR
INR: 2.5 — ABNORMAL HIGH (ref 0.8–1.2)
Prothrombin Time: 27.3 seconds — ABNORMAL HIGH (ref 11.4–15.2)

## 2021-07-28 LAB — BASIC METABOLIC PANEL
Anion gap: 11 (ref 5–15)
BUN: 31 mg/dL — ABNORMAL HIGH (ref 8–23)
CO2: 21 mmol/L — ABNORMAL LOW (ref 22–32)
Calcium: 8.2 mg/dL — ABNORMAL LOW (ref 8.9–10.3)
Chloride: 101 mmol/L (ref 98–111)
Creatinine, Ser: 1.79 mg/dL — ABNORMAL HIGH (ref 0.61–1.24)
GFR, Estimated: 38 mL/min — ABNORMAL LOW (ref >60)
Glucose, Bld: 101 mg/dL — ABNORMAL HIGH (ref 70–99)
Potassium: 3.8 mmol/L (ref 3.5–5.1)
Sodium: 133 mmol/L — ABNORMAL LOW (ref 135–145)

## 2021-07-28 MED ORDER — WARFARIN SODIUM 2.5 MG PO TABS
2.5000 mg | ORAL_TABLET | Freq: Once | ORAL | Status: AC
  Administered 2021-07-28: 2.5 mg via ORAL
  Filled 2021-07-28: qty 1

## 2021-07-28 NOTE — Progress Notes (Signed)
ANTICOAGULATION CONSULT NOTE   Pharmacy Consult for Heparin to Lovenox and Warfarin Indication:  Apical thrombus (recent stroke)  Labs: Recent Labs    07/26/21 0131 07/27/21 0554 07/28/21 0453  HGB 10.6* 10.5* 10.4*  HCT 30.8* 30.9* 31.3*  PLT 254 272 293  LABPROT 20.3*  --  27.3*  INR 1.7*  --  2.5*  CREATININE 1.63* 1.49* 1.79*   Assessment: 11 YOM who presented with an ischemic stroke s/p alteplase at 2000 on 8/16. 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 Currently on warfarin with Lovenox  INR up to 2.5 today  Goal of Therapy:  INR 2-3 Anti-Xa level 0.6-1 units/ml 4hrs after LMWH dose given Monitor platelets by anticoagulation protocol: Yes   Plan:  DC Lovenox Warfarin 2.5 mg po x 1 dose  Daily INR, CBC, s/s bleeding  Thank you Okey Regal, PharmD  Please refer to [facility] for unit-specific pharmacist

## 2021-07-28 NOTE — IPOC Note (Signed)
Overall Plan of Care [facility]) Patient Details Name: Roberto Dickson MRN: [id number] DOB: 1941/10/20  Admitting Diagnosis: Acute ischemic left middle cerebral artery (MCA) stroke [facility])  Hospital Problems: Principal Problem:   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[facility])     Functional Problem List: Nursing Bladder, Medication Management, Endurance, Safety, Bowel, Pain  PT Balance, Skin Integrity, Behavior, Edema, Endurance, Motor, Nutrition, Pain, Perception, Safety, Sensory  OT Balance, Sensory, Endurance, Motor, Skin Integrity, Vision  SLP    TR         Basic ADL's: OT Grooming, Bathing, Eating, Dressing, Toileting     Advanced  ADL's: OT       Transfers: PT Bed Mobility, Bed to Chair, Car, Occupational psychologist, Research scientist (life sciences): PT Ambulation, Psychologist, prison and probation services, Stairs     Additional Impairments: OT Fuctional Use of Upper Extremity  SLP        TR      Anticipated Outcomes Item Anticipated Outcome  Self Feeding mod I  Swallowing      Basic self-care  S  Toileting  min A   Bathroom Transfers CGA  Bowel/Bladder  manage w mod I assist  Transfers  Supervision  Locomotion  CGA with LRAD  Communication     Cognition     Pain  at or below level 4  Safety/Judgment  maintain w cues   Therapy Plan: PT Intensity: Minimum of 1-2 x/day ,45 to 90 minutes PT Frequency: 5 out of 7 days PT Duration Estimated Length of Stay: 21-24 days OT Intensity: Minimum of 1-2 x/day, 45 to 90 minutes OT Frequency: 5 out of 7 days OT Duration/Estimated Length of Stay: 3 to 3.5 weeks     Due to the current state of emergency, patients may not be receiving their 3-hours of Medicare-mandated therapy.   Team Interventions: Nursing Interventions Bladder Management, Bowel Management, Patient/Family Education, Disease Management/Prevention, Medication Management, Discharge Planning, Pain Management  PT interventions Ambulation/gait training,  Discharge planning, Psychosocial support, Functional mobility training, Therapeutic Activities, Visual/perceptual remediation/compensation, Balance/vestibular training, Disease management/prevention, Neuromuscular re-education, Skin care/wound management, Therapeutic Exercise, Wheelchair propulsion/positioning, Cognitive remediation/compensation, DME/adaptive equipment instruction, Pain management, Splinting/orthotics, UE/LE Strength taining/ROM, Community reintegration, Development worker, international aid stimulation, Patient/family education, Museum/gallery curator, UE/LE Coordination activities  OT Interventions Warden/ranger, Disease mangement/prevention, Neuromuscular re-education, Self Care/advanced ADL retraining, Therapeutic Exercise, Wheelchair propulsion/positioning, UE/LE Strength taining/ROM, Skin care/wound managment, Pain management, DME/adaptive equipment instruction, Community reintegration, Functional electrical stimulation, Patient/family education, Splinting/orthotics, UE/LE Coordination activities, Visual/perceptual remediation/compensation, Therapeutic Activities, Psychosocial support, Functional mobility training, Discharge planning  SLP Interventions    TR Interventions    SW/CM Interventions Discharge Planning, Psychosocial Support, Patient/Family Education, Disease Management/Prevention   Barriers to Discharge MD  Medical stability  Nursing Decreased caregiver support apt w elevator w spouse  PT Decreased caregiver support, New oxygen    OT      SLP      SW Neurogenic Bowel & Bladder, Decreased caregiver support     Team Discharge Planning: Destination: PT-Home ,OT- Home , SLP-  Projected Follow-up: PT-Home health PT, OT-  Home health OT, 24 hour supervision/assistance, SLP-  Projected Equipment Needs: PT-To be determined, OT- To be determined, SLP-  Equipment Details: PT- , OT-  Patient/family involved in discharge planning: PT- Patient,  OT-Patient, SLP-   MD ELOS: 21-24  days Medical Rehab Prognosis:  Excellent Assessment: The patient has been admitted for CIR therapies with the diagnosis of left mca infarct. The team will be addressing functional  mobility, strength, stamina, balance, safety, adaptive techniques and equipment, self-care, bowel and bladder mgt, patient and caregiver education, NMR, cognition, communication, tone mgt, pain control. Goals have been set at supervision to contact guard assist with self-care, CGA to min assist with mobility, and supervision with cognition.   Due to the current state of emergency, patients may not be receiving their 3 hours per day of Medicare-mandated therapy.    Ranelle Oyster, MD, FAAPMR     See Team Conference Notes for weekly updates to the plan of care

## 2021-07-28 NOTE — Progress Notes (Signed)
Inpatient Rehabilitation Care Coordinator Assessment and Plan Patient Details  Name: Roberto Dickson MRN: [id number] Date of Birth: Feb 19, 1941  Today's Date: 07/28/2021  Hospital Problems: Principal Problem:   Acute ischemic left middle cerebral artery (MCA) stroke [facility])  Past Medical History:  Past Medical History:  Diagnosis Date   Hypertension    Past Surgical History:  Past Surgical History:  Procedure Laterality Date   APPENDECTOMY     in his 43's   IR CT HEAD LTD  07/19/2021   IR FLUORO GUIDED NEEDLE PLC ASPIRATION/INJECTION LOC  07/21/2021   IR PERCUTANEOUS ART THROMBECTOMY/INFUSION INTRACRANIAL INC DIAG ANGIO  07/19/2021   RADIOLOGY WITH ANESTHESIA N/A 07/19/2021   Procedure: IR WITH ANESTHESIA;  Surgeon: Luanne Bras, MD;  Location: [facility];  Service: Radiology;  Laterality: N/A;   Social History:  reports that he has never smoked. He has never used smokeless tobacco. He reports current alcohol use of about 1.0 standard drink per week. No history on file for drug use.  Family / Support Systems Marital Status: Married Patient Roles: Spouse Spouse/Significant Other: Nelly Children: Equities trader (Daughter) Anticipated Caregiver: Sandra Cockayne Ability/Limitations of Caregiver: none Caregiver Availability: 24/7  Social History Preferred language: Spanish Religion:  Health Literacy - How often do you need to have someone help you when you read instructions, pamphlets, or other written material from your doctor or pharmacy?: Often Legal History/Current Legal Issues: n/a Guardian/Conservator: n/a   Abuse/Neglect Abuse/Neglect Assessment Can Be Completed: Yes Physical Abuse: Denies Verbal Abuse: Denies Sexual Abuse: Denies Exploitation of patient/patient's resources: Denies Self-Neglect: Denies  Patient response to: Social Isolation - How often do you feel lonely or isolated from those around you?: Patient unable to respond  Emotional Status Pt's affect,  behavior and adjustment status: patient pleasant. requesting sw to speak with daughter Recent Psychosocial Issues: n/a Psychiatric History: n/a Substance Abuse History: n/a  Patient / Family Perceptions, Expectations & Goals Pt/Family understanding of illness & functional limitations: yes Premorbid pt/family roles/activities: Patient limited in the community Anticipated changes in roles/activities/participation: spouse and daughter able to provide care Pt/family expectations/goals: Min A  [employer]: None Premorbid Home Care/DME Agencies: Other (Comment) Psychologist, occupational) Transportation available at discharge: Family able to transport Is the patient able to respond to transportation needs?: Yes In the past 12 months, has lack of transportation kept you from medical appointments or from getting medications?: No In the past 12 months, has lack of transportation kept you from meetings, work, or from getting things needed for daily living?: No  Discharge Planning Living Arrangements: Spouse/significant other, Children Support Systems: Spouse/significant other, Children Type of Residence: Private residence (1 level home, has Media planner) Insurance Resources: Information systems manager, [employer] (specify county) Pensions consultant: [employer] Screen Referred: No Living Expenses: Medical laboratory scientific officer Management: Patient, Spouse Does the patient have any problems obtaining your medications?: No Home Management: Independent Patient/Family Preliminary Plans: Spouse/daughter able to assist with medication management Care Coordinator Barriers to Discharge: Neurogenic Bowel & Bladder, Decreased caregiver support Care Coordinator Anticipated Follow Up Needs: HH/OP, SNF Expected length of stay: 21/24 Days  Clinical Impression SW met with patient, introduced self, explained role and addressed questions and concerns. Pt prefers sw speak with daughter. SW made attempt to call daughter at bedside, no  answer. SW will continue to attempt. NO additional questions and concerns, sw will cont to follow up with pt.   Dyanne Iha 07/28/2021, 1:08 PM

## 2021-07-28 NOTE — Progress Notes (Signed)
PROGRESS NOTE   Subjective/Complaints: Had a pretty good night. Slept well. No problems breathing. Ready for therapy today. Right side still weak. Interpreter at bedside  ROS: Patient denies fever, rash, sore throat, blurred vision, nausea, vomiting, diarrhea, cough, shortness of breath or chest pain, joint or back pain, headache, or mood change.    Objective:   No results found. Recent Labs    07/27/21 0554 07/28/21 0453  WBC 11.4* 11.7*  HGB 10.5* 10.4*  HCT 30.9* 31.3*  PLT 272 293   Recent Labs    07/27/21 0554 07/28/21 0453  NA 136 133*  K 4.1 3.8  CL 101 101  CO2 24 21*  GLUCOSE 108* 101*  BUN 27* 31*  CREATININE 1.49* 1.79*  CALCIUM 8.3* 8.2*    Intake/Output Summary (Last 24 hours) at 07/28/2021 1047 Last data filed at 07/28/2021 0354 Gross per 24 hour  Intake 218 ml  Output 700 ml  Net -482 ml        Physical Exam: Vital Signs Blood pressure 134/70, pulse 88, temperature 98.6 F (37 C), resp. rate 17, weight 90.5 kg, SpO2 97 %.  General: Alert and oriented x 3, No apparent distress HEENT: Head is normocephalic, atraumatic, PERRLA, EOMI, sclera anicteric, oral mucosa pink and moist, dentition intact, ext ear canals clear,  Neck: Supple without JVD or lymphadenopathy Heart: Reg rate and rhythm. No murmurs rubs or gallops Chest:   a few scattered wheezes. No rales, or rhonchi; no distress. O2 via Spray Abdomen: Soft, non-tender, non-distended, bowel sounds positive. Extremities: No clubbing, cyanosis, or edema. Pulses are 2+ Psych: Pt's affect is appropriate. Pt is cooperative Skin: right flank hematoma Neuro:  Pt alert and oriented to person, place, month. Follows basic commands. Language pretty fluid per interpreter. Mild right central 7. Speech sl dysarthric. RUE 3-4/5 prox to 2-3/5 at wrist and hand. RLE 3- to 3/5 HF, KE and 1-2/5 ADF/PF. LUE and LLE 5/5 grossly. No sensory deficits appreciated.   Musculoskeletal: right knee tender, mild effusion    Assessment/Plan: 1. Functional deficits which require 3+ hours per day of interdisciplinary therapy in a comprehensive inpatient rehab setting. Physiatrist is providing close team supervision and 24 hour management of active medical problems listed below. Physiatrist and rehab team continue to assess barriers to discharge/monitor patient progress toward functional and medical goals  Care Tool:  Bathing    Body parts bathed by patient: Right arm, Face, Chest, Abdomen, Front perineal area, Right upper leg, Left upper leg   Body parts bathed by helper: Left arm, Buttocks, Right lower leg, Left lower leg     Bathing assist Assist Level: Dependent - Patient 0% (stedy for STS)     Upper Body Dressing/Undressing Upper body dressing   What is the patient wearing?: Pull over shirt    Upper body assist Assist Level: Moderate Assistance - Patient 50 - 74%    Lower Body Dressing/Undressing Lower body dressing    Lower body dressing activity did not occur: Safety/medical concerns What is the patient wearing?: Incontinence brief, Pants     Lower body assist Assist for lower body dressing: Dependent - Patient 0% (stedy)     Toileting Toileting  Toileting assist Assist for toileting: Dependent - Patient 0% (stedy)     Transfers Chair/bed transfer  Transfers assist  Chair/bed transfer activity did not occur: Safety/medical concerns  Chair/bed transfer assist level: Dependent - mechanical lift (Stedy)     Locomotion Ambulation   Ambulation assist   Ambulation activity did not occur: Safety/medical concerns  Assist level: Moderate Assistance - Patient 50 - 74% Assistive device: Walker-rolling Max distance: 15   Walk 10 feet activity   Assist     Assist level: Moderate Assistance - Patient - 50 - 74% Assistive device: Walker-rolling   Walk 50 feet activity   Assist Walk 50 feet with 2 turns activity did  not occur: Safety/medical concerns         Walk 150 feet activity   Assist Walk 150 feet activity did not occur: Safety/medical concerns         Walk 10 feet on uneven surface  activity   Assist Walk 10 feet on uneven surfaces activity did not occur: Safety/medical concerns         Wheelchair     Assist Is the patient using a wheelchair?: No      Wheelchair assist level: Moderate Assistance - Patient 50 - 74% Max wheelchair distance: 50    Wheelchair 50 feet with 2 turns activity    Assist        Assist Level: Maximal Assistance - Patient 25 - 49%   Wheelchair 150 feet activity     Assist      Assist Level: Total Assistance - Patient < 25%   Blood pressure 134/70, pulse 88, temperature 98.6 F (37 C), resp. rate 17, weight 90.5 kg, SpO2 97 %.  Medical Problem List and Plan: 1.  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  LV thrombusAntithrombotics: -DVT/anticoagulation:  Pharmaceutical: Lovenox treatment dose bridge to coumadin.              -antiplatelet therapy: N/A 3. Pain Management: tylenol prn for thigh/groin pain.  4. Mood: LCSW to follow for evaluation and support.              -antipsychotic agents: N/A 5. Neuropsych: This patient may be capable of making decisions on his own behalf. 6. Skin/Wound Care: Routine pressure relief measures.  7. Fluids/Electrolytes/Nutrition: Monitor I/O. Check lytes in am.  8. HTN: Monitor BP TID--was taking BP meds daily (has online provider) --Continue metoprolol BP controlled 8/25 9. Acute on chronic renal failure?: Likely due to untreated HTN.             --BUN/SCr trending up--> 25/1.53 at admission -->29/1.63             --further upward drift 8/25, likely volume, intake related   -BP controlled. No obvious medication  causes   -push fluids   -recheck labs tomorrow 10. Leucocytosis: Likely due to pyuria (UCS negative)  --Treated with Rocephin D#5/5--dc 11. Dyslipidemia: LDL-132. On Lipitor.  12. Hyponatremia:  Na trending down-->recheck in am.  13. Hypocalcemia: Ionized calcium 1.11 -->add calcium supplement.  14. Pre-diabetes: Hgb A1C-5.9.  13. Anorexia/Constipation-  Has not had a BM in 8 days. Ducolax tabs tonight (used PTA) with enema in am if no results.  --  Needs assistance with meals.   14. BLE spasms? RLS?: Reports that legs "jump at any moment"- since mainly RLE- could be minimal signs of spasticity?             --stable today.  15. ABLA/Right groin hematoma: Continue to trend H/H with AC on board --Admission hgb 15.8-->10.6-->10.4.     LOS: 2 days A FACE TO FACE EVALUATION WAS PERFORMED  Ranelle Oyster 07/28/2021, 10:47 AM

## 2021-07-28 NOTE — Progress Notes (Signed)
Physical Therapy Session Note  Patient Details  Name: Roberto Dickson MRN: [id number] Date of Birth: 07-26-1941  Today's Date: 07/28/2021 PT Individual Time: 1321-1430 PT Individual Time Calculation (min): 69 min   Short Term Goals: Week 1:  PT Short Term Goal 1 (Week 1): Pt will complete bed mobility with MinA PT Short Term Goal 2 (Week 1): Pt will ambulate 67f or greater with ModA to work towards LTG PT Short Term Goal 3 (Week 1): Pt will consistently complete STS with mod A of one PT Short Term Goal 4 (Week 1): Pt will complete transfers with ModA PT Short Term Goal 5 (Week 1): Pt will initiate stair training  Skilled Therapeutic Interventions/Progress Updates:  Pt received in bed with HOB elevated. Pt agreeable to therapy. In person interpreter, DShanon Brow present throughout session. Pt on 4L O2 via nasal canula.  Supine> EOB with head of bed flat, ModA to for assist with roll and trunk transitioning. Pt reporting 8/10 pain and tingling sensation in RLE that started this morning. Notified RN and pt given pain medication. Screened sensation with no abnormal findings, pt responding to sensation testing the same as during evaluation yesterday.   STS to RW throughout session MSeminoleprogressing to light modA of one this session. Verbal cuing for feet placement, hand placement, and anterior lean. Pt benefits from slightly elevated seated surface 2/2 increased pain with hip flexion as pt has right groin hematoma.  Stand pivots throughout session with RW and MinA for steadying.   Gait training [id number] RW ModA for steadying, monitoring breathing, verbal cuing for posture, and manual assistance with RW management on R due to decreased grip strength on RUE. +2 for O2 management and wc follow for safety. Pt demonstrates slight forward flexed posture, decreased step length bilaterally R>L, decreased knee/hip flexion and DF RLE. Pt impulsively needing to sit for seated rest breaks as soon as  he expresses due to fatigue, pain, and weakness.  Seated balance with RUE reaching/object manipulation, cross body/lateral/anterior weight shifting x8 cones, supervision. Pt required increased time and therapist positioning cone in way that allowed pt to wiggle top of cone into slightly flexed fingers. Verbal encouragement given to continue to try and also to grab cone once in hand. 1x RUE/ 1x LUE(with increased difficulty of no LE support. Pt demonstrates good dynamic reaching balance with appropriate hip hike for stability with cross body movements and regaining midline with no extra assist.   Standing balance to build level 1 object with foam Legos with RUE, LUE support on RW. CGA for steadying, minA for assist with holding lego still for pt to get hand around it to grab/stabilizing building structure. Increased time for completion of activity. Pt demonstrated good shoulder AROM, limited ability to manipulate fingers, however, pt made improvements as activity progressed.   Monitored vitals throughout session: SpO2 maintained O2 above 94%  HR 100-113 with activity and rest. Suspected increased resting HR due to pain in RLE. Pt reporting he is unable to recall when the last time he requested pain medication. Discussed with RN options for improved pain control, RN states she will reach out to PA about option of scheduled pain medication.   Pt sitting in wheelchair, seat belt alarm in place, supplemental O2 transferred from tank to wall, 4L via nasal canula. Call bell within reach and water provided per request. All needs met at this time.   Therapy Documentation Precautions:  Precautions Precautions: Fall Precaution Comments: Right hemi, Spanish speaking, Right  groin hematoma Restrictions Weight Bearing Restrictions: No  Pain: Pain Assessment Pain Scale: 0-10 Pain Score: 8  Pain Type: Acute pain Pain Location: Leg Pain Orientation: Right Pain Descriptors / Indicators: Aching;Sore Pain  Intervention(s): Medication (See eMAR);RN made aware;Ambulation/increased activity;Repositioned   Therapy/Group: Individual Therapy  Savas Elvin, SPT 07/28/2021, 3:00 PM

## 2021-07-28 NOTE — Progress Notes (Signed)
Occupational Therapy Session Note  Patient Details  Name: Roberto Dickson MRN: [id number] Date of Birth: 08-12-1941  Today's Date: 07/28/2021 OT Individual Time: 5427-0623 OT Individual Time Calculation (min): 55 min    Short Term Goals: Week 1:  OT Short Term Goal 1 (Week 1): Pt will completed BSC/toilet transfer with max A via LRAD. OT Short Term Goal 2 (Week 1): Pt will don pants with max A + AE PRN. OT Short Term Goal 3 (Week 1): Pt will complete STS in prep for standing ADL with max A and no mechanical lift. OT Short Term Goal 4 (Week 1): Pt will completed seated grooming task with distant S.  Skilled Therapeutic Interventions/Progress Updates:    Pt greeted at time of session sitting up in wheelchair agreeable to OT session, interpreter present throughout session to provide interpretation. No pain reported but fatigued later in session. Focus of session on RUE NMR beginning with initial AAROM for RUE for shoulder flexion/extension, elbow flexion/extension, wrist flexion/extension and digits as well with therapist providing overpressure and PROM for full ROM with deficits more distal > proximal. Saebo utilized for RUE NMR for grasp/release of blocks multiple trials with Saebo applied to digit extensors to promote movement with good results but limited thumb abduction limiting. RUE NMR for building blocks copying therapist version for 5/5 trials with Mod A for thumb facilitation. Theraputty tan for the following for RUE: flattening, digit pulls/press for extension, squeezes, and pinches. Stedy back to bed Mod A for sit > stand d/t patient fatigue and request to lay down. Sit > supine Mod A. Pt on 4L of O2 throughout via [place]. Alarm on call bell in reach.  Therapy Documentation Precautions:  Precautions Precautions: Fall Precaution Comments: Right hemi, Spanish speaking, Right groin hematoma Restrictions Weight Bearing Restrictions: No     Therapy/Group: Individual Therapy  Erasmo Score 07/28/2021, 8:04 AM

## 2021-07-28 NOTE — Progress Notes (Signed)
Occupational Therapy Session Note  Patient Details  Name: Roberto Dickson MRN: [id number] Date of Birth: Jul 11, 1941  Today's Date: 07/28/2021 OT Individual Time: 3532-9924 OT Individual Time Calculation (min): 55 min    Short Term Goals: Week 1:  OT Short Term Goal 1 (Week 1): Pt will completed BSC/toilet transfer with max A via LRAD. OT Short Term Goal 2 (Week 1): Pt will don pants with max A + AE PRN. OT Short Term Goal 3 (Week 1): Pt will complete STS in prep for standing ADL with max A and no mechanical lift. OT Short Term Goal 4 (Week 1): Pt will completed seated grooming task with distant S.  Skilled Therapeutic Interventions/Progress Updates:    Pt received semi-reclined in bed, agreeable to therapy. In person interpreter [name] present. Session focus on self-care retraining, activity tolerance, RUE NMR, func transfers in prep for improved ADL/IADL/func mobility performance + decreased caregiver burden. Came to sitting EOB with light min A to lift trunk + increased time. Unable to achieve standing in stedy initially, with increased attempts able to achieve with mod A and then complete 5x STS with S with multimodal cuing. Stedy transfer > w/c. Completed UBB seated at sink with increase time and light min A to hold items with R hand, very motivated to incorporate hand functionally. Bathed BLE with min A to bathe feet. STS at sink with min A to power up and multimodal cuining. Able to stand and brush hair with CGA. Donned shirt with light min A to thread RUE, donned pants with max A. Provided soft resistive sponge and reviewed mental imagery practice as well. Pt demonstrated increased R digit flexion this date.   Sat02 at 91% on 4L via Bent post activity.    Pt left seated with w/c with interpreter present,  call bell in reach, and all immediate needs met.    Therapy Documentation Precautions:  Precautions Precautions: Fall Precaution Comments: Right hemi, Spanish speaking, Right groin  hematoma Restrictions Weight Bearing Restrictions: No  Pain: c/o R shoulder pain when reaching across midline, did not rate   ADL: See Care Tool for more details.  Therapy/Group: Individual Therapy  [name] MS, OTR/L  07/28/2021, 6:43 AM

## 2021-07-28 NOTE — Plan of Care (Signed)
  Problem: Consults Goal: RH STROKE PATIENT EDUCATION Description: See Patient Education module for education specifics  Outcome: Progressing   Problem: RH BOWEL ELIMINATION Goal: RH STG MANAGE BOWEL WITH ASSISTANCE Description: STG Manage Bowel with mod I Assistance. Outcome: Progressing Goal: RH STG MANAGE BOWEL W/MEDICATION W/ASSISTANCE Description: STG Manage Bowel with Medication with  mod I Assistance. Outcome: Progressing   Problem: RH BLADDER ELIMINATION Goal: RH STG MANAGE BLADDER WITH ASSISTANCE Description: STG Manage Bladder With mod I Assistance Outcome: Progressing Goal: RH STG MANAGE BLADDER WITH MEDICATION WITH ASSISTANCE Description: STG Manage Bladder With Medication With mod I  Assistance. Outcome: Progressing   Problem: RH SAFETY Goal: RH STG ADHERE TO SAFETY PRECAUTIONS W/ASSISTANCE/DEVICE Description: STG Adhere to Safety Precautions With cues Assistance/Device. Outcome: Progressing   Problem: RH PAIN MANAGEMENT Goal: RH STG PAIN MANAGED AT OR BELOW PT'S PAIN GOAL Description: At or below level 4 Outcome: Progressing   Problem: RH KNOWLEDGE DEFICIT Goal: RH STG INCREASE KNOWLEDGE OF DIABETES Description: Patient will be able to manage DM with medications and dietary modifications using handouts and educational resources independently Outcome: Progressing Goal: RH STG INCREASE KNOWLEDGE OF HYPERTENSION Description: Patient will be able to manage HTN with medications and dietary modifications using handouts and educational resources independently Outcome: Progressing Goal: RH STG INCREASE KNOWLEGDE OF HYPERLIPIDEMIA Description: Patient will be able to manage HLD with medications and dietary modifications using handouts and educational resources independently Outcome: Progressing Goal: RH STG INCREASE KNOWLEDGE OF STROKE PROPHYLAXIS Description: Patient will be able to manage secondary stroke risks with medications and dietary modifications using handouts  and educational resources independently Outcome: Progressing   

## 2021-07-28 NOTE — Progress Notes (Signed)
Inpatient Rehabilitation Center Individual Statement of Services  Patient Name:  Roberto Dickson  Date:  07/28/2021  Welcome to the Inpatient Rehabilitation Center.  Our goal is to provide you with an individualized program based on your diagnosis and situation, designed to meet your specific needs.  With this comprehensive rehabilitation program, you will be expected to participate in at least 3 hours of rehabilitation therapies Monday-Friday, with modified therapy programming on the weekends.  Your rehabilitation program will include the following services:  Physical Therapy (PT), Occupational Therapy (OT), Speech Therapy (ST), 24 hour per day rehabilitation nursing, Therapeutic Recreaction (TR), Neuropsychology, Care Coordinator, Rehabilitation Medicine, Nutrition Services, Pharmacy Services, and Other  Weekly team conferences will be held on Wednesdays to discuss your progress.  Your Inpatient Rehabilitation Care Coordinator will talk with you frequently to get your input and to update you on team discussions.  Team conferences with you and your family in attendance may also be held.  Expected length of stay:  21-24 Days  Overall anticipated outcome:  Min A  Depending on your progress and recovery, your program may change. Your Inpatient Rehabilitation Care Coordinator will coordinate services and will keep you informed of any changes. Your Inpatient Rehabilitation Care Coordinator's name and contact numbers are listed  below.  The following services may also be recommended but are not provided by the Inpatient Rehabilitation Center:   Home Health Rehabiltiation Services Outpatient Rehabilitation Services  Arrangements will be made to provide these services after discharge if needed.  Arrangements include referral to agencies that provide these services.  Your insurance has been verified to be: Medicare A & B Your primary doctor is:  NO PCP  Pertinent information will be shared with  your doctor and your insurance company.  Inpatient Rehabilitation Care Coordinator:  Lavera Guise, [place] [phone] or [phone]  Information discussed with and copy given to patient by: Andria Rhein, 07/28/2021, 11:10 AM

## 2021-07-29 DIAGNOSIS — I63512 Cerebral infarction due to unspecified occlusion or stenosis of left middle cerebral artery: Secondary | ICD-10-CM | POA: Diagnosis not present

## 2021-07-29 DIAGNOSIS — D62 Acute posthemorrhagic anemia: Secondary | ICD-10-CM | POA: Diagnosis not present

## 2021-07-29 DIAGNOSIS — I10 Essential (primary) hypertension: Secondary | ICD-10-CM | POA: Diagnosis not present

## 2021-07-29 DIAGNOSIS — R7989 Other specified abnormal findings of blood chemistry: Secondary | ICD-10-CM | POA: Diagnosis not present

## 2021-07-29 LAB — CBC
HCT: 30.3 % — ABNORMAL LOW (ref 39.0–52.0)
Hemoglobin: 10.1 g/dL — ABNORMAL LOW (ref 13.0–17.0)
MCH: 31.7 pg (ref 26.0–34.0)
MCHC: 33.3 g/dL (ref 30.0–36.0)
MCV: 95 fL (ref 80.0–100.0)
Platelets: 324 10*3/uL (ref 150–400)
RBC: 3.19 MIL/uL — ABNORMAL LOW (ref 4.22–5.81)
RDW: 14 % (ref 11.5–15.5)
WBC: 11.3 10*3/uL — ABNORMAL HIGH (ref 4.0–10.5)
nRBC: 0 % (ref 0.0–0.2)

## 2021-07-29 LAB — BASIC METABOLIC PANEL
Anion gap: 8 (ref 5–15)
BUN: 26 mg/dL — ABNORMAL HIGH (ref 8–23)
CO2: 24 mmol/L (ref 22–32)
Calcium: 8 mg/dL — ABNORMAL LOW (ref 8.9–10.3)
Chloride: 103 mmol/L (ref 98–111)
Creatinine, Ser: 1.53 mg/dL — ABNORMAL HIGH (ref 0.61–1.24)
GFR, Estimated: 46 mL/min — ABNORMAL LOW (ref >60)
Glucose, Bld: 103 mg/dL — ABNORMAL HIGH (ref 70–99)
Potassium: 4.2 mmol/L (ref 3.5–5.1)
Sodium: 135 mmol/L (ref 135–145)

## 2021-07-29 LAB — PROTIME-INR
INR: 2.5 — ABNORMAL HIGH (ref 0.8–1.2)
Prothrombin Time: 27.2 seconds — ABNORMAL HIGH (ref 11.4–15.2)

## 2021-07-29 MED ORDER — TRAZODONE HCL 50 MG PO TABS
50.0000 mg | ORAL_TABLET | Freq: Every evening | ORAL | Status: DC | PRN
  Administered 2021-07-29 – 2021-08-01 (×4): 50 mg via ORAL
  Filled 2021-07-29 (×4): qty 1

## 2021-07-29 MED ORDER — WARFARIN SODIUM 5 MG PO TABS
5.0000 mg | ORAL_TABLET | Freq: Every day | ORAL | Status: DC
  Administered 2021-07-29 – 2021-08-09 (×12): 5 mg via ORAL
  Filled 2021-07-29 (×12): qty 1

## 2021-07-29 NOTE — Progress Notes (Signed)
ANTICOAGULATION CONSULT NOTE   Pharmacy Consult for Heparin to Lovenox and Warfarin Indication:  Apical thrombus (recent stroke)  Labs: Recent Labs    07/27/21 0554 07/28/21 0453 07/29/21 0518  HGB 10.5* 10.4* 10.1*  HCT 30.9* 31.3* 30.3*  PLT 272 293 324  LABPROT  --  27.3* 27.2*  INR  --  2.5* 2.5*  CREATININE 1.49* 1.79* 1.53*   Assessment: 29 YOM who presented with an ischemic stroke s/p alteplase at 2000 on 8/16. 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  Currently on warfarin, Lovenox stopped 8/25  INR  2.5 today  Goal of Therapy:  INR goal 2 to 3 Monitor platelets by anticoagulation protocol: Yes   Plan:  Try Warfarin 5 mg po daily  Daily INR, CBC, s/s bleeding  Thank you Okey Regal, PharmD  Please refer to [facility] for unit-specific pharmacist

## 2021-07-29 NOTE — Progress Notes (Signed)
Writer received an email from Chana Bode RN case manager that stated patient was still having pain after Tylenol, and family requested to stay a couple night a week. Writer contacted Marissa Nestle PA who stated wife or daughter could spend the night a couple times a week. For additional pain relief to use ice packs or heat packs on for 15 minutes off for 15 minutes a couple times a day. Curt Bears RN was emailed back of P. Love PA decision.

## 2021-07-29 NOTE — Progress Notes (Signed)
Physical Therapy Session Note  Patient Details  Name: Roberto Dickson MRN: [id number] Date of Birth: [birth year]  Today's Date: 07/29/2021 PT Individual Time: 0803-0903 PT Individual Time Calculation (min): 60 min   Short Term Goals: Week 1:  PT Short Term Goal 1 (Week 1): Pt will complete bed mobility with MinA PT Short Term Goal 2 (Week 1): Pt will ambulate 57ft or greater with ModA to work towards LTG PT Short Term Goal 3 (Week 1): Pt will consistently complete STS with mod A of one PT Short Term Goal 4 (Week 1): Pt will complete transfers with ModA PT Short Term Goal 5 (Week 1): Pt will initiate stair training  Skilled Therapeutic Interventions/Progress Updates:  Patient supine in bed on entrance to room. Patient alert and agreeable to PT session. In-person interpreter, Onalee Hua, present for majority of session. Patient with no pain complaint throughout session until very end when he c/o of Bil foot pain. Requests medication if anything available. Denied need for repositioning. RN notified on completion of session. O2 checked throughout session with pt on 4L O2 and levels maintained at 99%. Palpitations in pulse noted throughout session with readings around 80 bpm and then dropping beats to half for brief periods.    Therapeutic Activity: Bed Mobility: Patient performed supine --> sit with heavy cueing for technique throughout only requiring min/ Mod A with push from bed to reach seated position on EOB. Sitting balance good with ability to raise feet in marching pattern and maintain balance as well as SpO2.   Transfers: Patient performed STS and SPVT transfers bed <> w/c with Mod A. Toilet transfer from bed using STEDY for time with very light Min A. Provided verbal cues for technique.  Neuromuscular Re-ed: NMR facilitated during session with focus on transitional balance/ weight shifting and muscle activation . Pt guided in STS performance. Min NDT technique employed along with instructions  for push from w/c armrests for improved power up. Blocked practice including anterior pelvic tilt, upright torso with forward lean over bil feet with heavy tc for forward lean. Once balance shifts over feet, vc to bring Bil hands to handrails for standing support. Pt improves from mod A to CGA performance throughout practice. NMR performed for improvements in motor control and coordination, balance, sequencing, judgement, and self confidence/ efficacy in performing all aspects of mobility at highest level of independence.   Patient seated upright  in w/c at end of session with brakes locked, no alarm set with handoff to OT for next session.      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

## 2021-07-29 NOTE — Progress Notes (Signed)
Occupational Therapy Session Note  Patient Details  Name: Roberto Dickson MRN: [id number] Date of Birth: [birth year]  Today's Date: 07/30/2021 OT Individual Time: [id number] and [id number] OT Individual Time Calculation (min): 62 min and 42 min   Short Term Goals: Week 1:  OT Short Term Goal 1 (Week 1): Pt will completed BSC/toilet transfer with max A via LRAD. OT Short Term Goal 2 (Week 1): Pt will don pants with max A + AE PRN. OT Short Term Goal 3 (Week 1): Pt will complete STS in prep for standing ADL with max A and no mechanical lift. OT Short Term Goal 4 (Week 1): Pt will completed seated grooming task with distant S.  Skilled Therapeutic Interventions/Progress Updates:    Pt greeted in bed, finishing up his fruit cup from breakfast. Stratus interpretor and in-person interpretor utilized during session. Pt with no c/o pain at rest, did c/o pain with slight pressure to feet when washing, noted B LE edema. Elevated feet at end of session to address therapeutically. Pt completed bathing/dressing tasks EOB at sit<stand level during tx. Min A for UB bathing with min HOH to incorporate the affected Rt UE functionally. Max A for LB bathing/dressing tasks, pt very limited with functional LE mobility and dynamic standing balance. Min A for sit<stands using RW for standing support. Worked on hemi dressing techniques as well with pt needing assistance and cuing. 02 sats throughout tx 94-97% on 2L, vcs throughout tx for mindful breathing, especially when standing due to pt visibly holding his breath. At end of tx pt opted to return to bed. He remained comfortably in bed at close of session after OT assisted him with urinal use. All needs within reach and bed alarm set. Tx focus placed on ADL retraining, sit<stands, and Rt NMR.   2nd Session 1:1 tx (42 min) Pt greeted via PT handoff, interpretor also present. No c/o pain. He was agreeable to work on a therapeutic activity focusing on [facility] of his Rt hand to  increase functional use of his affected UE during self care/daily activities. Set him up with the pegboard. Pt had a very difficult time manipulating the pegs in his hand, needed assistance to line peg up precisely on the board and also push hard enough to secure peg in place. Pt motivated to complete task at max level of independence. Cues for mindful breathing while participating and also for utilizing tenodesis grasping pattern, pt with active wrist extension against gravity. He needed a rest break after completing his first peg puzzle due to difficultly/exertion. Note that puzzles were downgraded to straight lines to maximize his success and independence. Pt remained sitting up at close of session, satting at 93-95% on 1L, all needs within reach.   Therapy Documentation Precautions:  Precautions Precautions: Fall Precaution Comments: Right hemi, Spanish speaking, Right groin hematoma Restrictions Weight Bearing Restrictions: No   Vital Signs: Therapy Vitals Temp: 99.3 F (37.4 C) Temp Source: Oral Pulse Rate: 80 Resp: 16 BP: 133/72 Patient Position (if appropriate): Lying Oxygen Therapy SpO2: 97 % O2 Device: Nasal Cannula Pain: Pain Assessment Pain Scale: 0-10 Pain Score: 0-No pain ADL: ADL Eating: Set up Where Assessed-Eating: Bed level Grooming: Supervision/safety Where Assessed-Grooming: Sitting at sink Upper Body Bathing: Minimal assistance Where Assessed-Upper Body Bathing: Sitting at sink Lower Body Bathing: Dependent (stedy) Where Assessed-Lower Body Bathing: Standing at sink Upper Body Dressing: Moderate assistance Where Assessed-Upper Body Dressing: Edge of bed Lower Body Dressing: Dependent Where Assessed-Lower Body Dressing: Bed level, Edge of  bed Toileting: Dependent Where Assessed-Toileting: Teacher, adult education: Dependent (stedy) Toilet Transfer Method: Other (comment) (stedy) Acupuncturist: Gaffer: Not  assessed Film/video editor: Not assessed          :  :     Therapy/Group: Individual Therapy  Tylia Ewell A Cristel Rail 07/30/2021, 12:28 PM

## 2021-07-29 NOTE — Progress Notes (Signed)
Occupational Therapy Session Note  Patient Details  Name: Roberto Dickson MRN: [id number] Date of Birth: 1941-05-11  Today's Date: 07/29/2021 OT Individual Time: [phone] OT Individual Time Calculation (min): 53 min + 72 min   Short Term Goals: Week 1:  OT Short Term Goal 1 (Week 1): Pt will completed BSC/toilet transfer with max A via LRAD. OT Short Term Goal 2 (Week 1): Pt will don pants with max A + AE PRN. OT Short Term Goal 3 (Week 1): Pt will complete STS in prep for standing ADL with max A and no mechanical lift. OT Short Term Goal 4 (Week 1): Pt will completed seated grooming task with distant S.  Skilled Therapeutic Interventions/Progress Updates:    Session 1 ([phone]): Pt received seated in w/c with pass off from PT, c/o B foot pain and RN present to administer pain rx, agreeable to therapy. Session focus on self-care retraining, activity tolerance, RUE NMR, func transfers in prep for improved ADL/IADL/func mobility performance + decreased caregiver burden. Pt requesting in-person interpreter Graciela vs use of stratus. Graciela unable to attend this session but will notify schedulers to schedule future sessions with pt. Pt completed oral care with set-up A seated at sink. With use of ace wrap to facilitate R grip, pt completed 2x10 of the following with 2 lb dowel rod: forward/backward circles, chest presss, B shoulder flexion. Therapist applied Saebo Stim One NMES to the R digit/wrist extensors at the below settings:     Saebo Stim One Intensity: 9 clicks Duration: 7 minutes 330 pulse width 35 Hz pulse rate On 8 sec/ off 8 sec Ramp up/ down 2 sec Symmetrical Biphasic wave form Max intensity 150m at 500 Ohm load  Pt denies pain and skin in intact.  Facilitate functional grasp/release on soft sponge, pt req extra assist to facilitate 2/2 of R thumb unable to abduct. Stand-pivot > bed with use of LUE on bed rail and mod A for balance/sequencing steps. Pt able to take a  drink from cup with L hand with assist to initially place cup. Returned to supine with mod  A to progress BLE onto bed.  SatO2 at 94-95% throughout session on 4L via [place]. Hooked back to wall O2.  Pt left semi-reclined in bed with bed alarm engaged, call bell in reach, and all immediate needs met.    Session 2 ([phone]: Pt received semi-reclined in bed, agreeable to therapy. Session focus on self-care retraining, activity tolerance, func transfers, RUE NMR in prep for improved ADL/IADL/func mobility performance + decreased caregiver burden. Came to sitting EOB with close S and use of bed features. Issues R saddle splint for RW to maintain R grasp. STS with min A and Stand-pivot with CGA > w/c x4 throughout session. Total A w/c transport to and from gym 2/2 time management and energy conservation. On mat table, completed massed practice of STS from elevated > lowest height, progressing from min A to CGA. Maintained standing to match playing card from various heights/outside BOS with LUE with CGA for balance. Practiced side-stepping up and down mat with RW + CGA, req seated rest break half way through. Issued universal hand cuff to hold crayon and provided coloring sheets as pt related enjoying paint-by-numbers. Therapist stabilized R shoulder/elbow to facilitate active wrist flexion/ext while pt colored in part of sheet. Short amb transfer back to bed with CGA + RW. Rreturned to supine with close S. C/o of R foot pain and requesting rx, LPN notified. Pt to tell daughter  to bring tennis shoes for better support as he only has slippers in room.  SatO2 at 94-95% throughout session on 4L via [place]. Hooked back to wall O2.  Pt left semi-reclined in bed with bed alarm engaged, call bell in reach, and all immediate needs met.    Therapy Documentation Precautions:  Precautions Precautions: Fall Precaution Comments: Right hemi, Spanish speaking, Right groin hematoma Restrictions Weight Bearing Restrictions:  No  Pain: ongoing B foot pain, did not rate   ADL: See Care Tool for more details.  Therapy/Group: Individual Therapy  [name] MS, OTR/L  07/29/2021, 6:36 AM

## 2021-07-29 NOTE — Progress Notes (Addendum)
PROGRESS NOTE   Subjective/Complaints: Says he only slept for a couple hours last night. No problems with breathing. Right knee sore, has spasms at times.   ROS: Patient denies fever, rash, sore throat, blurred vision, nausea, vomiting, diarrhea, cough, shortness of breath or chest pain, joint or back pain, headache, or mood change.   Objective:   No results found. Recent Labs    07/28/21 0453 07/29/21 0518  WBC 11.7* 11.3*  HGB 10.4* 10.1*  HCT 31.3* 30.3*  PLT 293 324   Recent Labs    07/28/21 0453 07/29/21 0518  NA 133* 135  K 3.8 4.2  CL 101 103  CO2 21* 24  GLUCOSE 101* 103*  BUN 31* 26*  CREATININE 1.79* 1.53*  CALCIUM 8.2* 8.0*    Intake/Output Summary (Last 24 hours) at 07/29/2021 0908 Last data filed at 07/29/2021 0600 Gross per 24 hour  Intake 236 ml  Output 1450 ml  Net -1214 ml        Physical Exam: Vital Signs Blood pressure 130/76, pulse 83, temperature 98.7 F (37.1 C), temperature source Oral, resp. rate 17, weight 90.5 kg, SpO2 99 %.  Constitutional: No distress . Vital signs reviewed. HEENT: NCAT, EOMI, oral membranes moist Neck: supple Cardiovascular: RRR without murmur. No JVD    Respiratory/Chest: CTA Bilaterally with occ wheezes or rales. Normal effort. O2 [place] 2L GI/Abdomen: BS +, non-tender, non-distended Ext: no clubbing, cyanosis, or edema Psych: pleasant and cooperative  Skin: right flank hematoma Neuro:  Pt alert and oriented to person, place, month. Follows basic commands. Language pretty fluid per interpreter. Mild right central 7. Speech dysarthric. RUE 3-4/5 prox to 2-3/5 at wrist and hand. RLE 3- to 3/5 HF, KE and 1-2/5 ADF/PF. LUE and LLE 5/5 grossly. No sensory deficits appreciated. A few beats of clonus RLE. DTR's brisk on right Musculoskeletal: right knee tender, tight. Had a hard time extending past -10 degrees. mild effusion. Right groin tender. No change in  appearance   Assessment/Plan: 1. Functional deficits which require 3+ hours per day of interdisciplinary therapy in a comprehensive inpatient rehab setting. Physiatrist is providing close team supervision and 24 hour management of active medical problems listed below. Physiatrist and rehab team continue to assess barriers to discharge/monitor patient progress toward functional and medical goals  Care Tool:  Bathing    Body parts bathed by patient: Right arm, Face, Chest, Abdomen, Front perineal area, Right upper leg, Left upper leg   Body parts bathed by helper: Left arm, Buttocks, Right lower leg, Left lower leg     Bathing assist Assist Level: Dependent - Patient 0% (stedy for STS)     Upper Body Dressing/Undressing Upper body dressing   What is the patient wearing?: Pull over shirt    Upper body assist Assist Level: Moderate Assistance - Patient 50 - 74%    Lower Body Dressing/Undressing Lower body dressing    Lower body dressing activity did not occur: Safety/medical concerns What is the patient wearing?: Incontinence brief, Pants     Lower body assist Assist for lower body dressing: Dependent - Patient 0%     Toileting Toileting    Toileting assist Assist for toileting:  Dependent - Patient 0%     Transfers Chair/bed transfer  Transfers assist  Chair/bed transfer activity did not occur: Safety/medical concerns  Chair/bed transfer assist level: Dependent - mechanical lift (Stedy)     Locomotion Ambulation   Ambulation assist   Ambulation activity did not occur: Safety/medical concerns  Assist level: Moderate Assistance - Patient 50 - 74% Assistive device: Walker-rolling Max distance: 15   Walk 10 feet activity   Assist     Assist level: Moderate Assistance - Patient - 50 - 74% Assistive device: Walker-rolling   Walk 50 feet activity   Assist Walk 50 feet with 2 turns activity did not occur: Safety/medical concerns         Walk 150  feet activity   Assist Walk 150 feet activity did not occur: Safety/medical concerns         Walk 10 feet on uneven surface  activity   Assist Walk 10 feet on uneven surfaces activity did not occur: Safety/medical concerns         Wheelchair     Assist Is the patient using a wheelchair?: No      Wheelchair assist level: Moderate Assistance - Patient 50 - 74% Max wheelchair distance: 50    Wheelchair 50 feet with 2 turns activity    Assist        Assist Level: Maximal Assistance - Patient 25 - 49%   Wheelchair 150 feet activity     Assist      Assist Level: Total Assistance - Patient < 25%   Blood pressure 130/76, pulse 83, temperature 98.7 F (37.1 C), temperature source Oral, resp. rate 17, weight 90.5 kg, SpO2 99 %.  Medical Problem List and Plan: 1.  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  LV thrombusAntithrombotics: -DVT/anticoagulation:  Pharmaceutical: Lovenox treatment dose bridge to coumadin.              -antiplatelet therapy: N/A 3. Pain Management: tylenol prn for thigh/groin pain.   -add voltaren gel for right knee 4. Mood/sleep: LCSW to follow for evaluation and support.   -will add prn trazodone for sleep             -antipsychotic agents: N/A 5. Neuropsych: This patient may be capable of making decisions on his own behalf. 6. Skin/Wound Care: Routine pressure relief measures.  7. Fluids/Electrolytes/Nutrition: Monitor I/O. Check lytes in am.  8. HTN: Monitor BP TID--was taking BP meds daily (has online provider) --Continue metoprolol BP controlled 8/26 9. Acute on chronic renal failure?:               -8/26 -BUN/SCr near admission numbers 26/1.53             --probably some fluctuation d/t prerenal/intake factors  -continue to encourage PO  -f/u labs Monday 10.  Leucocytosis: Likely due to pyuria (UCS negative)  --Treated with Rocephin D#5/5--dc 11. Oxygen need: pt has no history of lung disease that I can fin and wasn't using oxygen at home -wean O2 to off to keep sats greater than 90%  12. Hyponatremia:  Na stable at 135  13. Hypocalcemia: Ionized calcium 1.11 -->add calcium supplement.  14. Pre-diabetes: Hgb A1C-5.9.  13. Anorexia/Constipation-  still no  bm as of 8/26   -sorbitol + SSE if needed today --Needs assistance with meals.   14. BLE spasms? RLS?: Right more affected than left.              --doesn't appear to be affecting therapy at this point so we'll continue to observe 15. ABLA/Right groin hematoma/pseudoaneurysm: Continue to trend H/H with AC on board --Admission hgb 15.8-->10.6-->10.4-->10.1 8/26----recheck Monday   -right leg hasn't changed in appearance nor has pain increased  LOS: 3 days A FACE TO FACE EVALUATION WAS PERFORMED  Ranelle Oyster 07/29/2021, 9:08 AM

## 2021-07-30 LAB — BASIC METABOLIC PANEL
Anion gap: 6 (ref 5–15)
BUN: 29 mg/dL — ABNORMAL HIGH (ref 8–23)
CO2: 22 mmol/L (ref 22–32)
Calcium: 7.8 mg/dL — ABNORMAL LOW (ref 8.9–10.3)
Chloride: 107 mmol/L (ref 98–111)
Creatinine, Ser: 1.47 mg/dL — ABNORMAL HIGH (ref 0.61–1.24)
GFR, Estimated: 49 mL/min — ABNORMAL LOW (ref >60)
Glucose, Bld: 102 mg/dL — ABNORMAL HIGH (ref 70–99)
Potassium: 4.3 mmol/L (ref 3.5–5.1)
Sodium: 135 mmol/L (ref 135–145)

## 2021-07-30 LAB — CBC
HCT: 30.7 % — ABNORMAL LOW (ref 39.0–52.0)
Hemoglobin: 10.2 g/dL — ABNORMAL LOW (ref 13.0–17.0)
MCH: 31.2 pg (ref 26.0–34.0)
MCHC: 33.2 g/dL (ref 30.0–36.0)
MCV: 93.9 fL (ref 80.0–100.0)
Platelets: 362 10*3/uL (ref 150–400)
RBC: 3.27 MIL/uL — ABNORMAL LOW (ref 4.22–5.81)
RDW: 14 % (ref 11.5–15.5)
WBC: 11.7 10*3/uL — ABNORMAL HIGH (ref 4.0–10.5)
nRBC: 0 % (ref 0.0–0.2)

## 2021-07-30 LAB — PROTIME-INR
INR: 2.4 — ABNORMAL HIGH (ref 0.8–1.2)
Prothrombin Time: 25.9 seconds — ABNORMAL HIGH (ref 11.4–15.2)

## 2021-07-30 LAB — GLUCOSE, CAPILLARY
Glucose-Capillary: 88 mg/dL (ref 70–99)
Glucose-Capillary: 101 mg/dL — ABNORMAL HIGH (ref 70–99)

## 2021-07-30 NOTE — Progress Notes (Signed)
ANTICOAGULATION CONSULT NOTE   Pharmacy Consult for Heparin to Lovenox and Warfarin Indication:  Apical thrombus (recent stroke)  Labs: Recent Labs    07/28/21 0453 07/29/21 0518 07/30/21 0513  HGB 10.4* 10.1* 10.2*  HCT 31.3* 30.3* 30.7*  PLT 293 324 362  LABPROT 27.3* 27.2* 25.9*  INR 2.5* 2.5* 2.4*  CREATININE 1.79* 1.53* 1.47*   Assessment: 89 YOM who presented with an ischemic stroke s/p alteplase at 2000 on 8/16. 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  Currently on warfarin, Lovenox stopped 8/25  INR 2.4 today. Total weekly dose in the last 7 days: 37.5 mg/week. CBC stable, no s/sx of bleeding noted. Patient is eating 80-100% of meals.   Goal of Therapy:  INR goal 2 to 3 Monitor platelets by anticoagulation protocol: Yes   Plan:  Continue Warfarin 5 mg PO daily Daily INR, CBC, s/s bleeding  Rushie Goltz, Pharm.D. PGY-1 Ambulatory Care Resident Phone:[phone] 07/30/2021 11:45 AM

## 2021-07-30 NOTE — Progress Notes (Signed)
Physical Therapy Session Note  Patient Details  Name: IFEOLUWA BELLER MRN: [id number] Date of Birth: 1941-01-01  Today's Date: 07/30/2021 PT Individual Time: 1300-1330 PT Individual Time Calculation (min): 30 min   Short Term Goals: Week 1:  PT Short Term Goal 1 (Week 1): Pt will complete bed mobility with MinA PT Short Term Goal 2 (Week 1): Pt will ambulate 61f or greater with ModA to work towards LTG PT Short Term Goal 3 (Week 1): Pt will consistently complete STS with mod A of one PT Short Term Goal 4 (Week 1): Pt will complete transfers with ModA PT Short Term Goal 5 (Week 1): Pt will initiate stair training  Skilled Therapeutic Interventions/Progress Updates:   Pt received supine in bed and agreeable to PT. Supine>sit transfer with min assist at trunk. [facility] and [facility] assist for trunkal anterior weight shift.   [facility] applied Ted hose to Bil feet due to doctor orders and pt report of bil LE edema. Sit<>stand x 3 with min assist from PT for weight shift. Noted to have improved anterior weight shift with repetition.   Gait training with RW x 1033fwith min assist for safety. Noted to have short step Bil with increasing antalgic gait with increased distance, but pt willing to continue gait training until 10025f  Patient returned to room and [facility] and all needs met.         Therapy Documentation Precautions:  Precautions Precautions: Fall Precaution Comments: Right hemi, Spanish speaking, Right groin hematoma Restrictions Weight Bearing Restrictions: No  Pain: 6/10 Bil LE Pt repositioned and TEDs applied.     Therapy/Group: Individual Therapy  AusLorie Phenix27/2022, 3:25 PM

## 2021-07-30 NOTE — Progress Notes (Signed)
Physical Therapy Session Note  Patient Details  Name: Roberto Dickson MRN: [id number] Date of Birth: 1941/11/22  Today's Date: 07/30/2021 PT Individual Time: 0905-1000 PT Individual Time Calculation (min): 55 min   Short Term Goals: Week 1:  PT Short Term Goal 1 (Week 1): Pt will complete bed mobility with MinA PT Short Term Goal 2 (Week 1): Pt will ambulate 43f or greater with ModA to work towards LTG PT Short Term Goal 3 (Week 1): Pt will consistently complete STS with mod A of one PT Short Term Goal 4 (Week 1): Pt will complete transfers with ModA PT Short Term Goal 5 (Week 1): Pt will initiate stair training Week 2:     Skilled Therapeutic Interventions/Progress Updates:   Pt received supine in bed and agreeable to PT. Supine>sit transfer with min assist and cues for use of RUE to push into sitting from side lying. Reciprocal scooting EOB with min assist for improved trunkal activation on the R side. [facility] and [facility] from elevated bed height.   [facility] session with min assist and min0-mod cues for improved anterior wiehgt shift.   Standing march with RW x x 6 BLE. Standing foot taps to 4 inch step x 8BLE and to target on 4 inch step x 6 bil. Lateral/cross body reaches to grab cup and place on opposite side. .   Gait training with RW on 2 L/min O2 x 365fwith min assist, SpO2 98%. O2 decreased to 1L/min and performed additional 3063fith SpO2 95-96%.   Forward/reverse gait in parallel bars 3x 6f55fch with min assist and min-mod cues for improved posture, step length on the RLE and hip/knee flexion for limb advancement.   Pt returned to room and performed stand pivot transfer to bed with RW and min assist for safety. Sit>supine completed with supervision assist, and left supine in bed with call bell in reach and all needs met. O2 at 1L/min RN aware.        Therapy  Documentation Precautions:  Precautions Precautions: Fall Precaution Comments: Right hemi, Spanish speaking, Right groin hematoma Restrictions Weight Bearing Restrictions: No    Vital Signs:   Pain: Pain Assessment Pain Scale: 0-10 Pain Score: 0-No pain4/5 Bil LE. Pt repositioned.     Therapy/Group: Individual Therapy  AustLorie Phenix7/2022, 10:09 AM

## 2021-07-31 LAB — CBC
HCT: 29.5 % — ABNORMAL LOW (ref 39.0–52.0)
Hemoglobin: 9.9 g/dL — ABNORMAL LOW (ref 13.0–17.0)
MCH: 31.7 pg (ref 26.0–34.0)
MCHC: 33.6 g/dL (ref 30.0–36.0)
MCV: 94.6 fL (ref 80.0–100.0)
Platelets: 386 10*3/uL (ref 150–400)
RBC: 3.12 MIL/uL — ABNORMAL LOW (ref 4.22–5.81)
RDW: 14.1 % (ref 11.5–15.5)
WBC: 11.9 10*3/uL — ABNORMAL HIGH (ref 4.0–10.5)
nRBC: 0 % (ref 0.0–0.2)

## 2021-07-31 LAB — BASIC METABOLIC PANEL
Anion gap: 6 (ref 5–15)
BUN: 24 mg/dL — ABNORMAL HIGH (ref 8–23)
CO2: 23 mmol/L (ref 22–32)
Calcium: 8 mg/dL — ABNORMAL LOW (ref 8.9–10.3)
Chloride: 105 mmol/L (ref 98–111)
Creatinine, Ser: 1.47 mg/dL — ABNORMAL HIGH (ref 0.61–1.24)
GFR, Estimated: 49 mL/min — ABNORMAL LOW (ref >60)
Glucose, Bld: 101 mg/dL — ABNORMAL HIGH (ref 70–99)
Potassium: 4.3 mmol/L (ref 3.5–5.1)
Sodium: 134 mmol/L — ABNORMAL LOW (ref 135–145)

## 2021-07-31 LAB — GLUCOSE, CAPILLARY: Glucose-Capillary: 28 mg/dL — CL (ref 70–99)

## 2021-07-31 LAB — PROTIME-INR
INR: 2.3 — ABNORMAL HIGH (ref 0.8–1.2)
Prothrombin Time: 25 seconds — ABNORMAL HIGH (ref 11.4–15.2)

## 2021-07-31 NOTE — Progress Notes (Addendum)
ANTICOAGULATION CONSULT NOTE   Pharmacy Consult for Warfarin Indication:  Apical thrombus (recent stroke)  Labs: Recent Labs    07/29/21 0518 07/30/21 0513 07/31/21 0610  HGB 10.1* 10.2* 9.9*  HCT 30.3* 30.7* 29.5*  PLT 324 362 386  LABPROT 27.2* 25.9* 25.0*  INR 2.5* 2.4* 2.3*  CREATININE 1.53* 1.47* 1.47*   Assessment: 60 YOM who presented with an ischemic stroke s/p alteplase at 2000 on 8/16. 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  Currently on warfarin, Lovenox stopped 8/25  INR has started trending down but is still therapeutic at 2.3 today. CBC stable, no s/sx of bleeding noted. % meals eaten not documented in chart. Average daily dose over the past 7 days is ~5 mg and patient has been at goal for 4 days.  Goal of Therapy:  INR goal 2 to 3 Monitor platelets by anticoagulation protocol: Yes   Plan:  Warfarin 5 mg PO daily  Monitor daily INR, CBC, s/sx bleeding  Rushie Goltz, Pharm.D. PGY-1 Ambulatory Care Resident Phone:[phone] 07/31/2021 9:50 AM

## 2021-07-31 NOTE — Progress Notes (Signed)
Occupational Therapy Session Note  Patient Details  Name: Roberto Dickson MRN: [id number] Date of Birth: [birth year]  Today's Date: 08/01/2021 OT Individual Time: 1300-1414 OT Individual Time Calculation (min): 74 min   Short Term Goals: Week 1:  OT Short Term Goal 1 (Week 1): Pt will completed BSC/toilet transfer with max A via LRAD. OT Short Term Goal 2 (Week 1): Pt will don pants with max A + AE PRN. OT Short Term Goal 3 (Week 1): Pt will complete STS in prep for standing ADL with max A and no mechanical lift. OT Short Term Goal 4 (Week 1): Pt will completed seated grooming task with distant S.  Skilled Therapeutic Interventions/Progress Updates:    Pt greeted in bed with interpretor present. No c/o pain, agreeable to shower with encouragement. Min A for sit<stand from elevated bed (removed orthosis from RW per pt request) and CGA for ambulatory transfer to elevated toilet first, note that pt takes very small shuffling steps. Able to lower clothing himself before sitting to void. Pt continent B+B. Posterior hygiene completed with Min balance assistance and Min A overall, pt using his Lt hand to meet task demands. Shower transfer completed with Min A via side-stepping over wide ledge, increased time and cuing for executing safely. Pt then bathed at seated level, door ajar for optimal ventilation. Assistance for pericare using lateral lean towards the Rt and also for washing both feet. Min A for sit<stand when exiting shower, CGA for ambulation back to EOB. Max A for LB dressing due to time constraints, note that pt was able to elevate pants fully over hips himself! Min A for donning overhead shirt when pulling shirt down over Rt hemibody with visual cues for how to doff canula. He needed assistance to reapply canula on the Rt side. During shower, pt was on 1L 02 and then .75L during dressing (hooked back to room 02). Sats when he returned to bed were 87% and increased to 93% after ~1.5 minutes of  mindful breathing. He remained in bed at close of session, in care of NT for routine vitals assessment. Tx focus placed on Rt NMR, functional transfers, sitting/standing balance, and activity tolerance.  Note Min A to incorporate the Rt hand when applying deodorant + body spray  Therapy Documentation Precautions:  Precautions Precautions: Fall Precaution Comments: Right hemi, Spanish speaking, Right groin hematoma Restrictions Weight Bearing Restrictions: No  Vital Signs: Therapy Vitals Temp: 97.7 F (36.5 C) Pulse Rate: (!) 104 Resp: 19 BP: 127/67 Patient Position (if appropriate): Lying Oxygen Therapy SpO2: 92 % O2 Device: Room Air Pain: Pain Assessment Pain Score: 0-No pain ADL: ADL Eating: Set up Where Assessed-Eating: Bed level Grooming: Supervision/safety Where Assessed-Grooming: Sitting at sink Upper Body Bathing: Minimal assistance Where Assessed-Upper Body Bathing: Sitting at sink Lower Body Bathing: Dependent (stedy) Where Assessed-Lower Body Bathing: Standing at sink Upper Body Dressing: Moderate assistance Where Assessed-Upper Body Dressing: Edge of bed Lower Body Dressing: Dependent Where Assessed-Lower Body Dressing: Bed level, Edge of bed Toileting: Dependent Where Assessed-Toileting: Teacher, adult education: Dependent (stedy) Toilet Transfer Method: Other (comment) (stedy) Acupuncturist: Gaffer: Not assessed Film/video editor: Not assessed     Therapy/Group: Individual Therapy  Love Milbourne A Baruc Tugwell 08/01/2021, 3:17 PM

## 2021-07-31 NOTE — Progress Notes (Signed)
PROGRESS NOTE   Subjective/Complaints:  Pt reports that he feels good- no issues- still on O2 2L- did finally reports that B/L ankles hurting- appear bruised/a little swollen/red per PT this afternoon.   Ordered TEDs to knees.  Will recheck labs in AM- might be developing cellulitis?  Spoke via tele-interpretor.  ROS:   Pt denies SOB, abd pain, CP, N/V/C/D, and vision changes    Objective:   No results found. Recent Labs    07/30/21 0513 07/31/21 0610  WBC 11.7* 11.9*  HGB 10.2* 9.9*  HCT 30.7* 29.5*  PLT 362 386   Recent Labs    07/30/21 0513 07/31/21 0610  NA 135 134*  K 4.3 4.3  CL 107 105  CO2 22 23  GLUCOSE 102* 101*  BUN 29* 24*  CREATININE 1.47* 1.47*  CALCIUM 7.8* 8.0*    Intake/Output Summary (Last 24 hours) at 07/31/2021 1604 Last data filed at 07/31/2021 1057 Gross per 24 hour  Intake --  Output 1475 ml  Net -1475 ml        Physical Exam: Vital Signs Blood pressure 134/72, pulse 89, temperature 99.4 F (37.4 C), temperature source Oral, resp. rate 18, height 5\' 6"  (1.676 m), weight 90.5 kg, SpO2 95 %.   Tc-Tm- 99.4   General: awake, alert, appropriate, sitting up in bed; using tele-interpretor to communicate; NAD HENT: conjugate gaze; oropharynx moist CV: regular rate; no JVD Pulmonary: CTA B/L; no W/R/R- good air movement; On O2 2L by [place] GI: soft, NT, ND, (+)BS Psychiatric: appropriate- quiet, but cordial  Neurological: alert  Ext: no clubbing, cyanosis, or edema Psych: pleasant and cooperative  Skin: right flank hematoma Neuro:  Pt alert and oriented to person, place, month. Follows basic commands. Language pretty fluid per interpreter. Mild right central 7. Speech dysarthric. RUE 3-4/5 prox to 2-3/5 at wrist and hand. RLE 3- to 3/5 HF, KE and 1-2/5 ADF/PF. LUE and LLE 5/5 grossly. No sensory deficits appreciated. A few beats of clonus RLE. DTR's brisk on  right Musculoskeletal: right knee tender, tight. Had a hard time extending past -10 degrees. mild effusion. Right groin tender. No change in appearance   Assessment/Plan: 1. Functional deficits which require 3+ hours per day of interdisciplinary therapy in a comprehensive inpatient rehab setting. Physiatrist is providing close team supervision and 24 hour management of active medical problems listed below. Physiatrist and rehab team continue to assess barriers to discharge/monitor patient progress toward functional and medical goals  Care Tool:  Bathing    Body parts bathed by patient: Right arm, Face, Chest, Abdomen, Front perineal area, Right upper leg, Left upper leg   Body parts bathed by helper: Left arm, Buttocks, Right lower leg, Left lower leg     Bathing assist Assist Level: Moderate Assistance - Patient 50 - 74%     Upper Body Dressing/Undressing Upper body dressing   What is the patient wearing?: Pull over shirt    Upper body assist Assist Level: Moderate Assistance - Patient 50 - 74%    Lower Body Dressing/Undressing Lower body dressing      What is the patient wearing?: Incontinence brief, Pants     Lower body assist  Assist for lower body dressing: Maximal Assistance - Patient 25 - 49%     Toileting Toileting    Toileting assist Assist for toileting: Dependent - Patient 0%     Transfers Chair/bed transfer  Transfers assist  Chair/bed transfer activity did not occur: Safety/medical concerns  Chair/bed transfer assist level: Dependent - mechanical lift (Stedy)     Locomotion Ambulation   Ambulation assist      Assist level: Moderate Assistance - Patient 50 - 74% Assistive device: Walker-rolling Max distance: 15   Walk 10 feet activity   Assist     Assist level: Moderate Assistance - Patient - 50 - 74% Assistive device: Walker-rolling   Walk 50 feet activity   Assist Walk 50 feet with 2 turns activity did not occur: Safety/medical  concerns         Walk 150 feet activity   Assist Walk 150 feet activity did not occur: Safety/medical concerns         Walk 10 feet on uneven surface  activity   Assist Walk 10 feet on uneven surfaces activity did not occur: Safety/medical concerns         Wheelchair     Assist Is the patient using a wheelchair?: Yes Type of Wheelchair: Manual    Wheelchair assist level: Moderate Assistance - Patient 50 - 74% Max wheelchair distance: 50    Wheelchair 50 feet with 2 turns activity    Assist        Assist Level: Maximal Assistance - Patient 25 - 49%   Wheelchair 150 feet activity     Assist      Assist Level: Total Assistance - Patient < 25%   Blood pressure 134/72, pulse 89, temperature 99.4 F (37.4 C), temperature source Oral, resp. rate 18, height 5\' 6"  (1.676 m), weight 90.5 kg, SpO2 95 %.  Medical Problem List and Plan: 1.  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  LV thrombusAntithrombotics: -DVT/anticoagulation:  Pharmaceutical: Lovenox treatment dose bridge to coumadin.   8/28- INR 2.3-              -antiplatelet therapy: N/A 3. Pain Management: tylenol prn for thigh/groin pain.   -add voltaren gel for right knee 4. Mood/sleep: LCSW to follow for evaluation and support.   -will add prn trazodone for sleep             -antipsychotic agents: N/A 5. Neuropsych: This patient may be capable of making decisions on his own behalf. 6. Skin/Wound Care: Routine pressure relief measures.  7. Fluids/Electrolytes/Nutrition: Monitor I/O. Check lytes in am.  8. HTN: Monitor BP TID--was taking BP meds daily (has online provider) --Continue metoprolol BP controlled 8/26 9. Acute on chronic renal failure?:               -8/26 -BUN/SCr near admission numbers 26/1.53             --probably some fluctuation d/t  prerenal/intake factors  -continue to encourage PO  -f/u labs Monday  8/28- Cr stable at 1.47- will recheck in AM 10. Leucocytosis: Likely due to pyuria (UCS negative)  --Treated with Rocephin D#5/5--dc 11. Oxygen need: pt has no history of lung disease that I can fin and wasn't using  oxygen at home -wean O2 to off to keep sats greater than 90%  8/28- still on O2- will d/w nursing to wean 12. Hyponatremia:  Na stable at 135  13. Hypocalcemia: Ionized calcium 1.11 -->add calcium supplement.  14. Pre-diabetes: Hgb A1C-5.9.  13. Anorexia/Constipation-  still no bm as of 8/26   -sorbitol + SSE if needed today --Needs assistance with meals.   14. BLE spasms? RLS?: Right more affected than left.              --doesn't appear to be affecting therapy at this point so we'll continue to observe 15. ABLA/Right groin hematoma/pseudoaneurysm: Continue to trend H/H with AC on board --Admission hgb 15.8-->10.6-->10.4-->10.1 8/26----recheck Monday   -right leg hasn't changed in appearance nor has pain increased 15. B/L LE edema  8/28- will order TEDs and recheck labs in AM- cellulitis vs groin hematoma?   LOS: 5 days A FACE TO FACE EVALUATION WAS PERFORMED  Roberto Dickson 07/31/2021, 4:04 PM

## 2021-08-01 LAB — BASIC METABOLIC PANEL
Anion gap: 6 (ref 5–15)
BUN: 30 mg/dL — ABNORMAL HIGH (ref 8–23)
CO2: 24 mmol/L (ref 22–32)
Calcium: 8 mg/dL — ABNORMAL LOW (ref 8.9–10.3)
Chloride: 104 mmol/L (ref 98–111)
Creatinine, Ser: 1.89 mg/dL — ABNORMAL HIGH (ref 0.61–1.24)
GFR, Estimated: 36 mL/min — ABNORMAL LOW (ref >60)
Glucose, Bld: 102 mg/dL — ABNORMAL HIGH (ref 70–99)
Potassium: 4.5 mmol/L (ref 3.5–5.1)
Sodium: 134 mmol/L — ABNORMAL LOW (ref 135–145)

## 2021-08-01 LAB — CBC
HCT: 30.2 % — ABNORMAL LOW (ref 39.0–52.0)
Hemoglobin: 10 g/dL — ABNORMAL LOW (ref 13.0–17.0)
MCH: 31 pg (ref 26.0–34.0)
MCHC: 33.1 g/dL (ref 30.0–36.0)
MCV: 93.5 fL (ref 80.0–100.0)
Platelets: 430 10*3/uL — ABNORMAL HIGH (ref 150–400)
RBC: 3.23 MIL/uL — ABNORMAL LOW (ref 4.22–5.81)
RDW: 14.2 % (ref 11.5–15.5)
WBC: 11 10*3/uL — ABNORMAL HIGH (ref 4.0–10.5)
nRBC: 0 % (ref 0.0–0.2)

## 2021-08-01 LAB — PROTIME-INR
INR: 2.2 — ABNORMAL HIGH (ref 0.8–1.2)
Prothrombin Time: 24.2 seconds — ABNORMAL HIGH (ref 11.4–15.2)

## 2021-08-01 MED ORDER — TIZANIDINE HCL 2 MG PO TABS
2.0000 mg | ORAL_TABLET | Freq: Every day | ORAL | Status: DC
  Administered 2021-08-01 – 2021-08-09 (×9): 2 mg via ORAL
  Filled 2021-08-01 (×9): qty 1

## 2021-08-01 NOTE — Progress Notes (Signed)
Physical Therapy Session Note  Patient Details  Name: Roberto Dickson MRN: [id number] Date of Birth: 1941/09/03  Today's Date: 08/01/2021 PT Individual Time: 0830-0900 PT Individual Time Calculation (min): 30 min   Short Term Goals: Week 1:  PT Short Term Goal 1 (Week 1): Pt will complete bed mobility with MinA PT Short Term Goal 2 (Week 1): Pt will ambulate 32ft or greater with ModA to work towards LTG PT Short Term Goal 3 (Week 1): Pt will consistently complete STS with mod A of one PT Short Term Goal 4 (Week 1): Pt will complete transfers with ModA PT Short Term Goal 5 (Week 1): Pt will initiate stair training  Skilled Therapeutic Interventions/Progress Updates:  Patient seated EOB on entrance to room and completing breakfast with MD present for morning rounds. Pt relating trouble with sleeping through each night. Patient alert and agreeable to cotreat PT/OT session. MD also relates attempts to wean pt from )2 with continued improvements in SpO2. During sitting balance activity on EOB of dressing for day, pt's O2 removed at start of session and SpO2 level at 93% following dressing. Checked throughout session with levels >92% until return to room with level at 87-88% and O2 donned at 2L via [place]. Patient denied pain during session.  Therapeutic Activity: Transfers: Patient performed STS and SPVT transfers throughout session with MinA/ CGA. Provided verbal cues for throughout for hand/foot placement, forward lean. Pt improves to CGA performance by end of session.   Gait Training:  Patient ambulated 10' x1 across room with Min A and guard to R knee to prevent buckling. Pt guided in ambulation of 75' x1/ 20' x1 using RW with R hand AE grip. Provided Min A to complete and improved RLE progression and hold into extension with no buckling noted. Provided vc/ tc for upright posture, level gaze, increased RLE step height/ length.  Neuromuscular Re-ed: NMR facilitated during session with focus on  RLE muscle activation, motor control/ planning, coordination, standing balance. Pt guided in toe touches to 6" step, then to 1st and 2nd then 1st step of stairs. Performed RLE then LLE. Progressed to smaller target of red cups on steps for RLE. Then progressed to firm plant of foot on step with associated lunge on step to increase quadriceps eccentric and concentric activation. Pt able to perform well throughout with most difficulty with coordinated and controlled  movement of toe to smaller target of red cup. Performed on NMR performed for improvements in motor control and coordination, balance, sequencing, judgement, and self confidence/ efficacy in performing all aspects of mobility at highest level of independence.   Patient seated upright  in w/c at end of session with brakes locked, belt alarm set, and all needs within reach. Oriented to current time and OT session to start within 5-10 min. In-person interpreter, Onalee Hua, remaining in room for next session.      Therapy Documentation Precautions:  Precautions Precautions: Fall Precaution Comments: Right hemi, Spanish speaking, Right groin hematoma Restrictions Weight Bearing Restrictions: No  Therapy/Group: Individual Therapy  Loel Dubonnet PT, DPT 08/01/2021, 12:23 PM

## 2021-08-01 NOTE — Progress Notes (Signed)
PROGRESS NOTE   Subjective/Complaints:  Right leg still sore at thigh, has spasms in lower leg  ROS: Patient denies fever, rash, sore throat, blurred vision, nausea, vomiting, diarrhea, cough, shortness of breath or chest pain, joint or back pain, headache, or mood change.     Objective:   No results found. Recent Labs    07/31/21 0610 08/01/21 0554  WBC 11.9* 11.0*  HGB 9.9* 10.0*  HCT 29.5* 30.2*  PLT 386 430*   Recent Labs    07/31/21 0610 08/01/21 0554  NA 134* 134*  K 4.3 4.5  CL 105 104  CO2 23 24  GLUCOSE 101* 102*  BUN 24* 30*  CREATININE 1.47* 1.89*  CALCIUM 8.0* 8.0*    Intake/Output Summary (Last 24 hours) at 08/01/2021 1422 Last data filed at 08/01/2021 1256 Gross per 24 hour  Intake 580 ml  Output 1925 ml  Net -1345 ml        Physical Exam: Vital Signs Blood pressure 127/67, pulse (!) 104, temperature 97.7 F (36.5 C), resp. rate 19, height 5\' 6"  (1.676 m), weight 90.5 kg, SpO2 92 %.   Tc-Tm- 99.4   Constitutional: No distress . Vital signs reviewed. HEENT: NCAT, EOMI, oral membranes moist Neck: supple Cardiovascular: RRR without murmur. No JVD    Respiratory/Chest: CTA Bilaterally without wheezes or rales. Normal effort    GI/Abdomen: BS +, non-tender, non-distended Ext: no clubbing, cyanosis, or edema Psych: pleasant and cooperative  Ext: no clubbing, cyanosis, or edema Psych: pleasant and cooperative  Skin: right flank hematoma Neuro:  Pt alert and oriented to person, place, month. Follows basic commands. Language pretty fluid per interpreter. Mild right central 7. Speech dysarthric. RUE 3-4/5 prox to 2-3/5 at wrist and hand. RLE 3- to 3/5 HF, KE and 1-2/5 ADF/PF. LUE and LLE 5/5 grossly. No sensory deficits appreciated. A few beats of clonus RLE. DTR's brisk on right--stable Musculoskeletal: right knee tender, tight. Had a hard time extending past -10 degrees. mild effusion.  Right groin tender with indurated area/mass palpaple   Assessment/Plan: 1. Functional deficits which require 3+ hours per day of interdisciplinary therapy in a comprehensive inpatient rehab setting. Physiatrist is providing close team supervision and 24 hour management of active medical problems listed below. Physiatrist and rehab team continue to assess barriers to discharge/monitor patient progress toward functional and medical goals  Care Tool:  Bathing    Body parts bathed by patient: Right arm, Face, Chest, Abdomen, Front perineal area, Right upper leg, Left upper leg   Body parts bathed by helper: Left arm, Buttocks, Right lower leg, Left lower leg     Bathing assist Assist Level: Moderate Assistance - Patient 50 - 74%     Upper Body Dressing/Undressing Upper body dressing   What is the patient wearing?: Pull over shirt    Upper body assist Assist Level: Moderate Assistance - Patient 50 - 74%    Lower Body Dressing/Undressing Lower body dressing      What is the patient wearing?: Incontinence brief, Pants     Lower body assist Assist for lower body dressing: Maximal Assistance - Patient 25 - 49%     Toileting Toileting  Toileting assist Assist for toileting: Dependent - Patient 0%     Transfers Chair/bed transfer  Transfers assist  Chair/bed transfer activity did not occur: Safety/medical concerns  Chair/bed transfer assist level: Minimal Assistance - Patient > 75%     Locomotion Ambulation   Ambulation assist      Assist level: Moderate Assistance - Patient 50 - 74% Assistive device: Walker-rolling Max distance: 15   Walk 10 feet activity   Assist     Assist level: Moderate Assistance - Patient - 50 - 74% Assistive device: Walker-rolling   Walk 50 feet activity   Assist Walk 50 feet with 2 turns activity did not occur: Safety/medical concerns         Walk 150 feet activity   Assist Walk 150 feet activity did not occur:  Safety/medical concerns         Walk 10 feet on uneven surface  activity   Assist Walk 10 feet on uneven surfaces activity did not occur: Safety/medical concerns         Wheelchair     Assist Is the patient using a wheelchair?: Yes Type of Wheelchair: Manual    Wheelchair assist level: Moderate Assistance - Patient 50 - 74% Max wheelchair distance: 50    Wheelchair 50 feet with 2 turns activity    Assist        Assist Level: Maximal Assistance - Patient 25 - 49%   Wheelchair 150 feet activity     Assist      Assist Level: Total Assistance - Patient < 25%   Blood pressure 127/67, pulse (!) 104, temperature 97.7 F (36.5 C), resp. rate 19, height 5\' 6"  (1.676 m), weight 90.5 kg, SpO2 92 %.  Medical Problem List and Plan: 1.  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  LV thrombusAntithrombotics: -DVT/anticoagulation:  Pharmaceutical: Lovenox treatment dose bridge to coumadin.   8/28- INR 2.3-              -antiplatelet therapy: N/A 3. Pain Management: tylenol prn for thigh/groin pain.   -add voltaren gel for right knee 4. Mood/sleep: LCSW to follow for evaluation and support.   -will add prn trazodone for sleep             -antipsychotic agents: N/A  -add tizanidine 2mg  qhs for spasms 5. Neuropsych: This patient may be capable of making decisions on his own behalf. 6. Skin/Wound Care: Routine pressure relief measures.  7. Fluids/Electrolytes/Nutrition: Monitor I/O. Check lytes in am.  8. HTN: Monitor BP TID--was taking BP meds daily (has online provider) --Continue metoprolol BP controlled 8/26 9. Acute on chronic renal failure?:               -8/26 -BUN/SCr near admission numbers 26/1.53             --probably some fluctuation d/t prerenal/intake factors  -continue to encourage PO  -f/u labs  Monday  8/29 Cr up to 1.89 BUN to 30---encourage fluids 10. Leucocytosis: Likely due to pyuria (UCS negative)  --Treated with Rocephin D#5/5--dc 11. Oxygen need: pt has no history of lung disease that I can fin and wasn't using oxygen at home -wean O2 to off to keep sats greater than 90%  8/29- wean off 12. Hyponatremia:  Na stable at 135  13. Hypocalcemia: Ionized calcium 1.11 -->add calcium supplement.  14. Pre-diabetes: Hgb A1C-5.9.  13. Anorexia/Constipation-  moved bowels this weekend   -sorbitol + SSE  with results   14. BLE spasms? RLS?: Right more affected than left.              --tizanidine 15. ABLA/Right groin hematoma/pseudoaneurysm: Continue to trend H/H with AC on board --Admission hgb 15.8-->10.6-->10.4-->10.0 8/29----serial labs       LOS: 6 days A FACE TO FACE EVALUATION WAS PERFORMED  Ranelle Oyster 08/01/2021, 2:22 PM

## 2021-08-01 NOTE — Progress Notes (Signed)
Occupational Therapy Session Note  Patient Details  Name: Roberto Dickson MRN: [id number] Date of Birth: Jul 20, 1941  Today's Date: 08/01/2021 OT Individual Time: 0900-1000 OT Individual Time Calculation (min): 60 min    Short Term Goals: Week 1:  OT Short Term Goal 1 (Week 1): Pt will completed BSC/toilet transfer with max A via LRAD. OT Short Term Goal 2 (Week 1): Pt will don pants with max A + AE PRN. OT Short Term Goal 3 (Week 1): Pt will complete STS in prep for standing ADL with max A and no mechanical lift. OT Short Term Goal 4 (Week 1): Pt will completed seated grooming task with distant S.  Skilled Therapeutic Interventions/Progress Updates:    Pt received in wc with interpretor present.  Pt on 2L of oxygen but pt stated he was not on oxygen yesterday. Trialed part of session on room air and very difficult to assess his levels as a wide range of 85-98% O2 sats on monitor. Pt stated he felt fine and not short of breath. Had pt stay on 1 L of O2.   Pt already dressed for the day and declined a shower, but he was agreeable to practicing a "dry run" so he will feel comfortable doing the transfer later in the week.   Asked pt to show me how he stands up. Pt initially put R hand up on walker and started to push up with L hand but had his feet in front of his knees.  Had pt start over with cues to place feet back under knees and to only push up with L hand (wait to place hand on walker).  Pt did well with this at a CGA level. He then ambulated to bathroom with RW with CGA to practice sitting on regular BSC over toilet versus bariatic wide BSC. He did well with this.  Pt then practiced stepping into shower stall with min A and mod cues for stepping in as he has to take "scooting steps" as shower ledge very wide and difficult to step over.    Pt then ambulated back to wc and did well with stand to sit.   In chair worked on shoulder AROM which pt did well. He has a very light grasp but it was  strong enough to reach back to wc arm rest to sit and to push up on and also hold onto grab bar.  Very weak finger pinch strength. Worked on tip pinch with super soft theraputty with hand over hand assist as this was difficult. Tried some pencil manipulation but unable to hold pencil in writing position.  Used foam block which pt was able to do more easily.  Pt resting in wc with all needs met and belt alarm on.   Therapy Documentation Precautions:  Precautions Precautions: Fall Precaution Comments: Right hemi, Spanish speaking, Right groin hematoma Restrictions Weight Bearing Restrictions: No    Vital Signs:   Pain: Pain Assessment Pain Score: 0-No pain    Therapy/Group: Individual Therapy  [place] 08/01/2021, 12:19 PM

## 2021-08-01 NOTE — Progress Notes (Signed)
ANTICOAGULATION CONSULT NOTE - Follow Up Consult  Pharmacy Consult for Coumadin  Indication:  Apical thrombus  No Known Allergies  Patient Measurements: Height: 5\' 6"  (167.6 cm) Weight: 90.5 kg (199 lb 8.3 oz) IBW/kg (Calculated) : 63.8  Vital Signs: Temp: 98.3 F (36.8 C) (08/29 0548) Temp Source: Oral (08/29 0548) BP: 131/76 (08/29 0548) Pulse Rate: 76 (08/29 0548)  Labs: Recent Labs    07/30/21 0513 07/31/21 0610 08/01/21 0554  HGB 10.2* 9.9* 10.0*  HCT 30.7* 29.5* 30.2*  PLT 362 386 430*  LABPROT 25.9* 25.0* 24.2*  INR 2.4* 2.3* 2.2*  CREATININE 1.47* 1.47* 1.89*    Estimated Creatinine Clearance: 33.9 mL/min (A) (by C-G formula based on SCr of 1.89 mg/dL (H)).   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 No AC pta, CBC stable INR 2.2.  Goal of Therapy:  INR 2-3 Monitor platelets by anticoagulation protocol: Yes   Plan:  Warfarin 5 mg po daily Change INR to MWF   Alicia Seib S. 08/03/21, PharmD, BCPS Clinical Staff Pharmacist [email or web address]  Merilynn Finland, Jaylynne Birkhead Stillinger 08/01/2021,10:09 AM

## 2021-08-01 NOTE — Progress Notes (Addendum)
Occupational Therapy Session Note  Patient Details  Name: Roberto Dickson MRN: [id number] Date of Birth: 11-28-1941  Today's Date: 08/01/2021 OT Co-Treatment Time:  -      Short Term Goals: Week 1:  OT Short Term Goal 1 (Week 1): Pt will completed BSC/toilet transfer with max A via LRAD. OT Short Term Goal 2 (Week 1): Pt will don pants with max A + AE PRN. OT Short Term Goal 3 (Week 1): Pt will complete STS in prep for standing ADL with max A and no mechanical lift. OT Short Term Goal 4 (Week 1): Pt will completed seated grooming task with distant S.  Skilled Therapeutic Interventions/Progress Updates:  Pt greeted EOB finishing breakfast agreeable to PT/OT co treatment. [employer] interpreter and in person interpreter utilized during session. Session focus on BADL reeducation, functional mobility and functional reach with RUE to facilitate independence with BADLs. Pt currently requires MOD A for UB dressing from EOB with OH shirt and MAX A for LB Dressing with pants and brief. MIN A progressing to CGA for sit<>stands with Rw to pull pants up to waist line. Pt completed functional mobility to sink with RW and MIN A +2 for safety, pt stood for oral care with CGA. Pt transported to gym with total A for time mgmt. Pt completed functional mobility with Rw ( ~ 66ft) and MIN A +2 for safety. Pt completed dynamic reaching task where pt instructed to reach across midline to retrieve cones with RUE and return to L side. Pt completed task with CGA +2 for safety. Pt additionally completed toe taps on steps with LLE to increase strength and endurance for higher level functional mobility tasks. Pt return to room in similar fashion as previously indicated where pt left seated in w/c with safety belt activated and all needs within reach.   Of note pt initially on 2L [place] upon arrival, doffed O2 for ADLs with pt SpO2 >90%. Checked O2 at end of session with SpO2 reaching 87% reapplied O2 at 2L at end of session. No s/s  of SOB during session.   Therapy Documentation Precautions:  Precautions Precautions: Fall Precaution Comments: Right hemi, Spanish speaking, Right groin hematoma Restrictions Weight Bearing Restrictions: No    Pain: Pt reports mild pain in feet, decided not to don shoes d/t pain, proivded rest breaks and repositioning as pain mgmt strategies    Therapy/Group: Co-Treatment  Barron Schmid 08/01/2021, 12:04 PM

## 2021-08-02 MED ORDER — TRAZODONE HCL 100 MG PO TABS
100.0000 mg | ORAL_TABLET | Freq: Every evening | ORAL | Status: DC | PRN
  Administered 2021-08-04 – 2021-08-09 (×6): 100 mg via ORAL
  Filled 2021-08-02 (×6): qty 1

## 2021-08-02 NOTE — Progress Notes (Signed)
PROGRESS NOTE   Subjective/Complaints: C/o insomnia- agreeable to trying higher dose of medication Denies pain  ROS: Patient denies fever, rash, sore throat, blurred vision, nausea, vomiting, diarrhea, cough, shortness of breath or chest pain, joint or back pain, headache, or mood change. +insomnia    Objective:   No results found. Recent Labs    07/31/21 0610 08/01/21 0554  WBC 11.9* 11.0*  HGB 9.9* 10.0*  HCT 29.5* 30.2*  PLT 386 430*   Recent Labs    07/31/21 0610 08/01/21 0554  NA 134* 134*  K 4.3 4.5  CL 105 104  CO2 23 24  GLUCOSE 101* 102*  BUN 24* 30*  CREATININE 1.47* 1.89*  CALCIUM 8.0* 8.0*    Intake/Output Summary (Last 24 hours) at 08/02/2021 1236 Last data filed at 08/02/2021 0710 Gross per 24 hour  Intake 458 ml  Output 1800 ml  Net -1342 ml        Physical Exam: Vital Signs Blood pressure 135/79, pulse 84, temperature 98.3 F (36.8 C), temperature source Oral, resp. rate 16, height 5\' 6"  (1.676 m), weight 90.5 kg, SpO2 94 %. Gen: no distress, normal appearing HEENT: oral mucosa pink and moist, NCAT Cardio: Reg rate Chest: normal effort, normal rate of breathing Abd: soft, non-distended Ext: no edema Psych: pleasant, normal affect Skin: right flank hematoma Neuro:  Pt alert and oriented to person, place, month. Follows basic commands. Language pretty fluid per interpreter. Mild right central 7. Speech dysarthric. RUE 3-4/5 prox to 2-3/5 at wrist and hand. RLE 3- to 3/5 HF, KE and 1-2/5 ADF/PF. LUE and LLE 5/5 grossly. No sensory deficits appreciated. A few beats of clonus RLE. DTR's brisk on right--stable Musculoskeletal: right knee tender, tight. Had a hard time extending past -10 degrees. mild effusion. Right groin tender with indurated area/mass palpaple   Assessment/Plan: 1. Functional deficits which require 3+ hours per day of interdisciplinary therapy in a comprehensive  inpatient rehab setting. Physiatrist is providing close team supervision and 24 hour management of active medical problems listed below. Physiatrist and rehab team continue to assess barriers to discharge/monitor patient progress toward functional and medical goals  Care Tool:  Bathing    Body parts bathed by patient: Right arm, Face, Chest, Abdomen, Front perineal area, Right upper leg, Left upper leg   Body parts bathed by helper: Buttocks, Right lower leg, Left lower leg     Bathing assist Assist Level: Moderate Assistance - Patient 50 - 74%     Upper Body Dressing/Undressing Upper body dressing   What is the patient wearing?: Pull over shirt    Upper body assist Assist Level: Minimal Assistance - Patient > 75%    Lower Body Dressing/Undressing Lower body dressing      What is the patient wearing?: Incontinence brief, Pants     Lower body assist Assist for lower body dressing: Maximal Assistance - Patient 25 - 49%     Toileting Toileting    Toileting assist Assist for toileting: Moderate Assistance - Patient 50 - 74%     Transfers Chair/bed transfer  Transfers assist  Chair/bed transfer activity did not occur: Safety/medical concerns  Chair/bed transfer assist level: Minimal Assistance -  Patient > 75%     Locomotion Ambulation   Ambulation assist      Assist level: Moderate Assistance - Patient 50 - 74% Assistive device: Walker-rolling Max distance: 15   Walk 10 feet activity   Assist     Assist level: Moderate Assistance - Patient - 50 - 74% Assistive device: Walker-rolling   Walk 50 feet activity   Assist Walk 50 feet with 2 turns activity did not occur: Safety/medical concerns         Walk 150 feet activity   Assist Walk 150 feet activity did not occur: Safety/medical concerns         Walk 10 feet on uneven surface  activity   Assist Walk 10 feet on uneven surfaces activity did not occur: Safety/medical concerns          Wheelchair     Assist Is the patient using a wheelchair?: Yes Type of Wheelchair: Manual    Wheelchair assist level: Moderate Assistance - Patient 50 - 74% Max wheelchair distance: 50    Wheelchair 50 feet with 2 turns activity    Assist        Assist Level: Maximal Assistance - Patient 25 - 49%   Wheelchair 150 feet activity     Assist      Assist Level: Total Assistance - Patient < 25%   Blood pressure 135/79, pulse 84, temperature 98.3 F (36.8 C), temperature source Oral, resp. rate 16, height 5\' 6"  (1.676 m), weight 90.5 kg, SpO2 94 %.  Medical Problem List and Plan: 1.  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  LV thrombusAntithrombotics: -DVT/anticoagulation:  Pharmaceutical: Continue Lovenox treatment dose bridge to coumadin.   8/30 INR 2.2             -antiplatelet therapy: N/A 3. Thigh/groin pain: continue tylenol prn for thigh/groin pain.  4. Mood/sleep: LCSW to follow for evaluation and support.   -will add prn trazodone for sleep             -antipsychotic agents: N/A  -add tizanidine 2mg  qhs for spasms 5. Neuropsych: This patient may be capable of making decisions on his own behalf. 6. Skin/Wound Care: Routine pressure relief measures.  7. Fluids/Electrolytes/Nutrition: Monitor I/O. Check lytes in am.  8. HTN: Monitor BP TID--was taking BP meds daily (has online provider) --Continue metoprolol BP controlled 8/26 9. Acute on chronic renal failure?:               -8/26 -BUN/SCr near admission numbers 26/1.53             --probably some fluctuation d/t prerenal/intake factors  -continue to encourage PO  -f/u labs Monday  8/29 Cr up to 1.89 BUN to 30---encourage fluids 10. Leucocytosis: Likely due to pyuria (UCS negative)  --Treated with Rocephin D#5/5--dc 11. Oxygen need: pt has no history  of lung disease that I can fin and wasn't using oxygen at home -wean O2 to off to keep sats greater than 90%  8/29- wean off 12. Hyponatremia:  Na stable at 135  13. Hypocalcemia: Ionized calcium 1.11 -->add calcium supplement.  14. Pre-diabetes: Hgb A1C-5.9.  13. Anorexia/Constipation-  moved bowels this weekend   -sorbitol + SSE  with results  14. BLE spasms? RLS?: Right more affected than left.              --tizanidine 15. ABLA/Right groin hematoma/pseudoaneurysm: Continue to trend H/H with AC on board --Admission hgb 15.8-->10.6-->10.4-->10.0 8/29----serial labs 16. Right knee pain: continue voltaren gel 17. Insomnia: increase trazodone to 100mg        LOS: 7 days A FACE TO FACE EVALUATION WAS PERFORMED  08/02/2021, 12:36 PM

## 2021-08-02 NOTE — Progress Notes (Signed)
Physical Therapy Session Note  Patient Details  Name: Roberto Dickson MRN: [id number] Date of Birth: [birth year]  Today's Date: 08/02/2021 PT Individual Time: 0906-1003 PT Individual Time Calculation (min): 57 min   Short Term Goals: Week 1:  PT Short Term Goal 1 (Week 1): Pt will complete bed mobility with MinA PT Short Term Goal 1 - Progress (Week 1): Met PT Short Term Goal 2 (Week 1): Pt will ambulate 57f or greater with ModA to work towards LTG PT Short Term Goal 2 - Progress (Week 1): Met PT Short Term Goal 3 (Week 1): Pt will consistently complete STS with mod A of one PT Short Term Goal 3 - Progress (Week 1): Met PT Short Term Goal 4 (Week 1): Pt will complete transfers with ModA PT Short Term Goal 4 - Progress (Week 1): Met PT Short Term Goal 5 (Week 1): Pt will initiate stair training PT Short Term Goal 5 - Progress (Week 1): Met Week 2:  PT Short Term Goal 1 (Week 2): STG=LTG due to ELOS  Skilled Therapeutic Interventions/Progress Updates:  Session 1: Patient supine in bed on entrance to room. Patient alert and agreeable to PT session. Patient denied pain during session. In person interpreter, DShanon Brow present for session. O2 on 1L at start of session with SpO2 at 93% at rest and rising to 99% with activity. Continued session on RA with SpO2 maintained >91% throughout.   Therapeutic Activity: Bed Mobility: Patient performed supine <> sit with supervision requiring no vc for technique or effort. Discussion with pt re: bed room setup and height of bed at home. Bed adjusted to pt's related home environment and pt performed sit <> supine with supervision and vc for safe posterior scoot prior to return to supine. Requires use of bedrail to complete. Disc with pt re: further training to complete without use of bedrail prior to d/c.  Transfers: Patient performed STS transfers with and without use of armrest for powerup. Initially completes with supervision and no UE support. With  fatigue requires use of UE for assist in powerup to continue supervision LOA. SPVT transfers completed with CGA/ supervision and vc for using feel of seat on back o f BLE to figure proximity to seat. Provided verbal cues throughout for effort and technique.  Gait Training:  Patient ambulated 120' x2/ 874 x1 using RW with good grasp to AE grip on RW. Completes with CGA throughout. Demonstrated slow pace with vc provided for upright posture and level gaze. Good foot clearance but vc provided for leading RLE advancement with knee flexion as pt advances LE with reduced flexion. Gait speed = 0.18 m/s  Neuromuscular Re-ed: NMR facilitated during session with focus on standing balance and muscle activation. Pt guided in static standing balance and proprioception of upright posture and forward reach. BLE equal activation in rise to stand in midline. Dynamic stepping to reach with forward lunge to each LE. NMR performed for improvements in motor control and coordination, balance, sequencing, judgement, and self confidence/ efficacy in performing all aspects of mobility at highest level of independence.   Patient supine  in bed at end of session with brakes locked, bed alarm set, and all needs within reach. Oriented to current time and time of next therapy session following lunch with this therapist.    Session 2: Patient supine in bed on entrance to room. Patient alert and agreeable to PT session. Patient relates increase in pain in Bil feet following extended time in stance. Addressed with change  in positioning. In-person interpreter present for entire session. Pt on RA on entrance to room. Maintained on RA throughout with SpO2 92-98% throughout session.   Therapeutic Activity: Bed Mobility: Patient performed supine <> sit with supervision. Use of bedrail required. Transfers: Patient performed STS and SPVT transfers with supervision/ CGA throughout session. Provided verbal cues for foot positioning, adequate  forward lean.  Gait Training:  Patient ambulated 210 feet using RW with CGA. Demonstrated ability to manage RW over threshold into elevator with good unweighting of walker. Provided vc/ tc for maintaining upright posture and level gaze with increased knee flexion when advancing LE.  Neuromuscular Re-ed: NMR facilitated during session with focus on standing balance, RUE muscle activation, problem solving/ compensation. Pt guided in use of BITS to complete dot finding task, number sequencing, and maintaining drawing of shapes within borders. Progressed distance from dot finding task with good ability to reach targets with RUE except to far L and pt performs reach with LUE and no UE support with good balance maintained. 1-15 numbers sequenced well with 55% accuracy of RUE d/t multiple touches with other parts of R hand. Pt assisted with positioning of R hand to maintain finger point. Guided in performance of shape drawing with pt demonstrating ability to problem solve ability to utilize different parts of knuckles and  wrist to complete with decreased touches outside of borders. NMR performed for improvements in motor control and coordination, balance, sequencing, judgement, and self confidence/ efficacy in performing all aspects of mobility at highest level of independence.   Patient supine  in bed at end of session with brakes locked, bed alarm set, and all needs within reach. NT present to take vitals.    Therapy Documentation Precautions:  Precautions Precautions: Fall Precaution Comments: Right hemi, Spanish speaking, Right groin hematoma Restrictions Weight Bearing Restrictions: No  Therapy/Group: Individual Therapy  Alger Simons Pt, DPT 08/02/2021, 7:04 PM

## 2021-08-02 NOTE — Progress Notes (Signed)
Occupational Therapy Session Note  Patient Details  Name: Roberto Dickson MRN: [id number] Date of Birth: August 10, 1941  Today's Date: 08/02/2021 OT Individual Time: [phone] OT Individual Time Calculation (min): 55 min    Short Term Goals: Week 1:  OT Short Term Goal 1 (Week 1): Pt will completed BSC/toilet transfer with max A via LRAD. OT Short Term Goal 2 (Week 1): Pt will don pants with max A + AE PRN. OT Short Term Goal 3 (Week 1): Pt will complete STS in prep for standing ADL with max A and no mechanical lift. OT Short Term Goal 4 (Week 1): Pt will completed seated grooming task with distant S.  Skilled Therapeutic Interventions/Progress Updates:    Pt received semi-reclined in bed, reports improved B foot pain, agreeable to therapy. In-person interpreter [name] present throughout session. Session focus on self-care retraining, activity tolerance, func transfers in prep for improved ADL/IADL/func mobility performance + decreased caregiver burden. Agreeable to shower. Came to sitting EOB with close S + elevated HOB. STS with CGA + RW from elevated surface and amb > shower with CGA. Side-stepped into shower with CGA for balance + use of grab /bar RW. Ind recalled technique from previous session. Bathed UB with close S seated, LB with min A to bathe buttocks/B feet. Will benefit from [facility] sponge. Exited shower same manner as above. Donned shirt S for hemi-technique. Donned brief/pants with mod A to thread BLE and cues for pulling up over hips with LUE. CGA for STS at sink. Completed oral care seated with S, pt able to set-up toothbrush with one-handed technique. Total A to don B teds/socks. Reviewed R hand exercises + PLB for improved satO2. Denies SOB throughout session. Updated NT/ safety plan on rec to amb with RW to and from toilet.   SatO2 at 91% on 1L via [place] pre activity, 94% on 1L via [place] post activity.   Pt left seated in w/c awaiting following PT session, call bell in reach, and all  immediate needs met.    Therapy Documentation Precautions:  Precautions Precautions: Fall Precaution Comments: Right hemi, Spanish speaking, Right groin hematoma Restrictions Weight Bearing Restrictions: No  Pain: see session note   ADL: See Care Tool for more details.   Therapy/Group: Individual Therapy  [name] MS, OTR/L  08/02/2021, 6:36 AM

## 2021-08-03 DIAGNOSIS — D62 Acute posthemorrhagic anemia: Secondary | ICD-10-CM

## 2021-08-03 DIAGNOSIS — I10 Essential (primary) hypertension: Secondary | ICD-10-CM

## 2021-08-03 DIAGNOSIS — G479 Sleep disorder, unspecified: Secondary | ICD-10-CM

## 2021-08-03 LAB — PROTIME-INR
INR: 2.3 — ABNORMAL HIGH (ref 0.8–1.2)
Prothrombin Time: 25 seconds — ABNORMAL HIGH (ref 11.4–15.2)

## 2021-08-03 NOTE — Progress Notes (Signed)
Patient ID: Roberto Dickson, male   DOB: 1941-06-04, 80 y.o.   MRN: [id number]  Roberto Dickson, Bedside Commode and shower seat ordered through [employer]  Lavera Guise, [place] [phone]

## 2021-08-03 NOTE — Progress Notes (Signed)
Physical Therapy Weekly Progress Note  Patient Details  Name: Roberto Dickson MRN: [id number] Date of Birth: 05/22/1941  Beginning of progress report period: July 27, 2021 End of progress report period: August 03, 2021  Today's Date: 08/03/2021 PT Individual Time: 0906-1003 [id number] PT Individual Time Calculation (min): 57 min and 42 min   Patient has met 5 of 5 short term goals.  Pt is making beyond expected progress towards LTG. He has improved to supervision assist bed mobility and min assist transfers and gait ove rthe past week with improved body awareness and sequencing allowing such significant functional gains.   Patient continues to demonstrate the following deficits muscle weakness, muscle joint tightness, and muscle paralysis, decreased cardiorespiratoy endurance, unbalanced muscle activation, motor apraxia, and decreased coordination, decreased attention to right, and decreased sitting balance, decreased standing balance, decreased postural control, hemiplegia, and decreased balance strategies and therefore will continue to benefit from skilled PT intervention to increase functional independence with mobility.  Patient progressing toward long term goals..  Continue plan of care.  PT Short Term Goals Week 1:  PT Short Term Goal 1 (Week 1): Pt will complete bed mobility with MinA PT Short Term Goal 1 - Progress (Week 1): Met PT Short Term Goal 2 (Week 1): Pt will ambulate 24f or greater with ModA to work towards LTG PT Short Term Goal 2 - Progress (Week 1): Met PT Short Term Goal 3 (Week 1): Pt will consistently complete STS with mod A of one PT Short Term Goal 3 - Progress (Week 1): Met PT Short Term Goal 4 (Week 1): Pt will complete transfers with ModA PT Short Term Goal 4 - Progress (Week 1): Met PT Short Term Goal 5 (Week 1): Pt will initiate stair training PT Short Term Goal 5 - Progress (Week 1): Met Week 2:  PT Short Term Goal 1 (Week 2): STG=LTG due to  ELOS  Skilled Therapeutic Interventions/Progress Updates:  Session 1  Pt received sitting in WC and agreeable to PT. Pt transported to day room.   Stand pivot transfer to [place] with RW and supervision assist. [place] reciprocal movement training x4 min +2 min with therapeutic rest break. Min assist to stabilize the RUE.   Gait training with RW x 1826fand CGA. For safety.  Dynamic gait training for forward/reverse 3 x 1069fith RW and min assist; cues for increased step length on the RLE in reverse.  Gait training without AD x 65f42fth min assist. Noted to have increased Bil ankle pain R>L following gait without AD. Performed reciprocal stepping over 1 inch obstacle on floor to encourage increased step length and weight shift on BLE with BUE support on RW.   Pt returned to room and performed stand pivot transfer to bed with min assist for safety. Sit>supine completed with supervision assist, and left supine in bed with call bell in reach and all needs met.    Session 2.   Pt received sitting in WC and agreeable to PT. Pt [facility] to rehab gym [facility] reverse with assist to maintain grasp on the RUE. Seated reaching with Bits for visual scanning 1 min . Standing foot tap on 4inch step x 6 BLE with UE support on RW. Step up x 3 with RW with LLE x 3 and min assist for safety. Gait training with RW up/down ramp with min assist for safety and cues for AD management and improved step length on the RLE. Pt returned  to room and performed stand pivot transfer to bed with RW and min assist. Sit>supine completed with supervision assist, and left supine in bed with call bell in reach and all needs met.         Therapy Documentation Precautions:  Precautions Precautions: Fall Precaution Comments: Right hemi, Spanish speaking, Right groin hematoma Restrictions Weight Bearing Restrictions: No    Pain: Pain Assessment Pain Scale: 0-10 Pain Score: 0-No  pain    Therapy/Group: Individual Therapy  Lorie Phenix 08/03/2021, 9:54 AM

## 2021-08-03 NOTE — Plan of Care (Signed)
  Problem: Consults Goal: RH STROKE PATIENT EDUCATION Description: See Patient Education module for education specifics  Outcome: Progressing   Problem: RH BOWEL ELIMINATION Goal: RH STG MANAGE BOWEL WITH ASSISTANCE Description: STG Manage Bowel with mod I Assistance. Outcome: Progressing Goal: RH STG MANAGE BOWEL W/MEDICATION W/ASSISTANCE Description: STG Manage Bowel with Medication with  mod I Assistance. Outcome: Progressing   Problem: RH BLADDER ELIMINATION Goal: RH STG MANAGE BLADDER WITH ASSISTANCE Description: STG Manage Bladder With mod I Assistance Outcome: Progressing Goal: RH STG MANAGE BLADDER WITH MEDICATION WITH ASSISTANCE Description: STG Manage Bladder With Medication With mod I  Assistance. Outcome: Progressing   Problem: RH SAFETY Goal: RH STG ADHERE TO SAFETY PRECAUTIONS W/ASSISTANCE/DEVICE Description: STG Adhere to Safety Precautions With cues Assistance/Device. Outcome: Progressing   Problem: RH PAIN MANAGEMENT Goal: RH STG PAIN MANAGED AT OR BELOW PT'S PAIN GOAL Description: At or below level 4 Outcome: Progressing   Problem: RH KNOWLEDGE DEFICIT Goal: RH STG INCREASE KNOWLEDGE OF DIABETES Description: Patient will be able to manage DM with medications and dietary modifications using handouts and educational resources independently Outcome: Progressing Goal: RH STG INCREASE KNOWLEDGE OF HYPERTENSION Description: Patient will be able to manage HTN with medications and dietary modifications using handouts and educational resources independently Outcome: Progressing Goal: RH STG INCREASE KNOWLEGDE OF HYPERLIPIDEMIA Description: Patient will be able to manage HLD with medications and dietary modifications using handouts and educational resources independently Outcome: Progressing Goal: RH STG INCREASE KNOWLEDGE OF STROKE PROPHYLAXIS Description: Patient will be able to manage secondary stroke risks with medications and dietary modifications using handouts  and educational resources independently Outcome: Progressing   

## 2021-08-03 NOTE — Plan of Care (Signed)
  Problem: RH Ambulation Goal: LTG Patient will ambulate in controlled environment (PT) Description: LTG: Patient will ambulate in a controlled environment, # of feet with assistance (PT). Flowsheets (Taken 08/03/2021 0939) LTG: Pt will ambulate in controlled environ  assist needed:: Supervision/Verbal cueing LTG: Ambulation distance in controlled environment: 192ft with LRAD Goal: LTG Patient will ambulate in home environment (PT) Description: LTG: Patient will ambulate in home environment, # of feet with assistance (PT). Flowsheets (Taken 08/03/2021 0939) LTG: Pt will ambulate in home environ  assist needed:: Supervision/Verbal cueing LTG: Ambulation distance in home environment: 44ft with LRAD

## 2021-08-03 NOTE — Progress Notes (Signed)
Occupational Therapy Weekly Progress Note  Patient Details  Name: Roberto Dickson MRN: [id number] Date of Birth: March 16, 1941  Beginning of progress report period: July 27, 2021 End of progress report period: August 03, 2021  Today's Date: 08/03/2021 OT Individual Time: 1448-1856 OT Individual Time Calculation (min): 57 min + 39 min   Patient has met 4 of 4 short term goals.  Pt has made excellent progress this week in OT. Pt has demonstrated improved dynamic standing balance, func use of RUE, improved activity tolerance  to presently complete UB ADL at S, LB ADL at mod level. Pt cont to be primarily limited by ongoing B foot pain and fatigue. R hand currently assessed at [facility] level III. Anticipate intermittent S and CGA physical assist required upon DC.   Patient continues to demonstrate the following deficits: muscle weakness, decreased cardiorespiratoy endurance, impaired timing and sequencing, abnormal tone, unbalanced muscle activation, decreased coordination, and decreased motor planning, and decreased sitting balance, decreased standing balance, decreased postural control, hemiplegia, and decreased balance strategies and therefore will continue to benefit from skilled OT intervention to enhance overall performance with BADL, iADL, and Reduce care partner burden.  Patient progressing toward long term goals..  Continue plan of care.  OT Short Term Goals Week 2:  OT Short Term Goal 1 (Week 2): Pt will don pants with min A + AE PRN. OT Short Term Goal 2 (Week 2): Pt will complete LBB with CGA + AE PRN. OT Short Term Goal 3 (Week 2): Pt will tolerate standing >8 min in prep for standing ADL with CGA. OT Short Term Goal 4 (Week 2): Pt will ind recall >3 theraputty exercises for improved R hand NMR.  Skilled Therapeutic Interventions/Progress Updates:    Session 1 [phone]): Pt received semi-reclined in bed, finishing breakfast with LPN present, agreeable to therapy. Session focus  on self-care retraining, activity tolerance, RUE NMR, func transfers, standing activity tolerance in prep for improved ADL/IADL/func mobility performance + decreased caregiver burden. Endorses ongoing B foot pain, but reports it is much improved. Nicole Kindred in-person interpreter present. Came to sitting EOB with close S + use of elevated HOB. STS with CGA and short distance amb with RW + R saddle splint > sink with CGA. With encouragement, remained standing to complete oral care with close S and set-up of toothbrush. Total A w/c transport to and from gym total A 2/2 time management and energy conservation. Seated at [facility], completed single target game + bell cancellation task with RUE with the following results:  Single Target: 75.61% accuracy, 3.8 sec RT, 31 hits Bell Cancellation: 4 min 31 secs, 5 distractors hit  Unable to isolate individual digits this date, but demonstrates improved active wrist and digit flexion/ extension.  Therapist applied Saebo Stim One NMES to the R digit/wrist extensors at the below settings: Pt able to grasp/release water cup with min A from L hand to facilitate grasp.    Saebo Stim One Intensity: 13 clicks Duration: 10 min 330 pulse width 35 Hz pulse rate On 8 sec/ off 8 sec Ramp up/ down 2 sec Symmetrical Biphasic wave form Max intensity 154m at 500 Ohm load  Pt denies pain and skin in intact.   SatO2 at 91% on RA prior to activity, O2 remained doffed with SatO2 at 93% on RA post activity.   Pt left seated in w/c with safety belt alarm engaged, call bell in reach, and all immediate needs met.    Session 2 ([phone]: Pt received semi-reclined  in bed, agreeable to therapy. Session focus on self-care retraining, activity tolerance, DME edu, func transfers, IADL retraining in prep for improved ADL/IADL/func mobility performance + decreased caregiver burden. In person interpreter [name] present. Came to sitting EOB with CGA + increased time + use of bed features.  Stand-pivot with CGA + RW + R saddle splint > w/c. Discussed DME recs + follow up recs. Has walk-in shower, but reports he is unsure if a shower chair will allow for him to side-step in as he does here. Demonstrated posterior method and rec that family bring a picture of his bathroom. Pt is unsure at this time if his daughter will be able to transport him to [facility], will discuss with family during family edu. Total A w/c transport to and from gym 2/2 time management and energy conservation. Verbally given a list of 5 items to locate in gift shop, able to navigate gift shop with CGA + RW + min A to locate 1 item. Reports increased R ankle pain and fatigue post activity.   SatO2 at 93% on RA prior to activity, O2 remained doffed with SatO2 at 91% on RA post activity. Guided through PLB.   Pt left in w/c with interpreter present awaiting following PT session , call bell in reach, and all immediate needs met.    Therapy Documentation Precautions:  Precautions Precautions: Fall Precaution Comments: Right hemi, Spanish speaking, Right groin hematoma Restrictions Weight Bearing Restrictions: No  Pain: see session notes ADL: See Care Tool for more details.   Therapy/Group: Individual Therapy  [name] MS, OTR/L  08/03/2021, 6:34 AM

## 2021-08-03 NOTE — Progress Notes (Signed)
Patient ID: Roberto Dickson, male   DOB: 04-04-1941, 80 y.o.   MRN: [id number] Team Conference Report to Patient/Family  Team Conference discussion was reviewed with the patient and caregiver, including goals, any changes in plan of care and target discharge date.  Patient and caregiver express understanding and are in agreement.  The patient has a target discharge date of 08/10/21.  SW called pt daughter to provide conference updates. BP improving, kidney function improving, d/c 02. Encouraging pt to hydrate. Sleep improving. Patient doing well and potentially will have goals upgraded. Pt can be limited by foot pain, daughter reports both of patients feet are swollen. Family education scheduled 9/3 1-3 PM  Andria Rhein 08/03/2021, 1:43 PM

## 2021-08-03 NOTE — Progress Notes (Signed)
Patient ID: Roberto Dickson, male   DOB: 1941-05-14, 80 y.o.   MRN: [id number]  Pt daughter has no current preference of HH selection.   [place], [place] [phone]

## 2021-08-03 NOTE — Progress Notes (Signed)
Patient ID: Roberto Dickson, male   DOB: Sep 14, 1941, 80 y.o.   MRN: [id number]  Patient referral sent to [facility] [place], [place] [phone]

## 2021-08-03 NOTE — Progress Notes (Signed)
PROGRESS NOTE   Subjective/Complaints: Patient seen sitting up, working with therapy this morning.  Interpreter present.  He states he slept well overnight.  He denies any complaints  ROS: Denies CP, SOB, N/V/D   Objective:   No results found. Recent Labs    08/01/21 0554  WBC 11.0*  HGB 10.0*  HCT 30.2*  PLT 430*    Recent Labs    08/01/21 0554  NA 134*  K 4.5  CL 104  CO2 24  GLUCOSE 102*  BUN 30*  CREATININE 1.89*  CALCIUM 8.0*     Intake/Output Summary (Last 24 hours) at 08/03/2021 1104 Last data filed at 08/03/2021 0400 Gross per 24 hour  Intake 354 ml  Output 1205 ml  Net -851 ml         Physical Exam: Vital Signs Blood pressure 128/75, pulse 76, temperature 98.4 F (36.9 C), temperature source Oral, resp. rate 18, height 5\' 6"  (1.676 m), weight 87.7 kg, SpO2 97 %. Constitutional: No distress . Vital signs reviewed. HENT: Normocephalic.  Atraumatic. Eyes: EOMI. No discharge. Cardiovascular: No JVD.  RRR. Respiratory: Normal effort.  No stridor.  Bilateral clear to auscultation. GI: Non-distended.  BS +. Skin: Warm and dry.  Intact. Psych: Normal mood.  Normal behavior. Musc: Lower extremity edema.  No tenderness in extremities. Neuro: Alert Mild dysarthria Motor: RUE 3-4/5 prox to 2-3/5 at wrist and hand. RLE 3- to 3/5 HF, KE and 1-2/5 ADF/PF. LUE and LLE 5/5 grossly. No sensory deficits appreciated. A few beats of clonus RLE. DTR's brisk on right--stable  Assessment/Plan: 1. Functional deficits which require 3+ hours per day of interdisciplinary therapy in a comprehensive inpatient rehab setting. Physiatrist is providing close team supervision and 24 hour management of active medical problems listed below. Physiatrist and rehab team continue to assess barriers to discharge/monitor patient progress toward functional and medical goals  Care Tool:  Bathing    Body parts bathed by  patient: Right arm, Face, Chest, Abdomen, Front perineal area, Right upper leg, Left upper leg   Body parts bathed by helper: Buttocks, Right lower leg, Left lower leg     Bathing assist Assist Level: Moderate Assistance - Patient 50 - 74%     Upper Body Dressing/Undressing Upper body dressing   What is the patient wearing?: Pull over shirt    Upper body assist Assist Level: Supervision/Verbal cueing    Lower Body Dressing/Undressing Lower body dressing      What is the patient wearing?: Incontinence brief, Pants     Lower body assist Assist for lower body dressing: Moderate Assistance - Patient 50 - 74%     Toileting Toileting    Toileting assist Assist for toileting: Moderate Assistance - Patient 50 - 74%     Transfers Chair/bed transfer  Transfers assist  Chair/bed transfer activity did not occur: Safety/medical concerns  Chair/bed transfer assist level: Minimal Assistance - Patient > 75%     Locomotion Ambulation   Ambulation assist      Assist level: Moderate Assistance - Patient 50 - 74% Assistive device: Walker-rolling Max distance: 15   Walk 10 feet activity   Assist     Assist level:  Moderate Assistance - Patient - 50 - 74% Assistive device: Walker-rolling   Walk 50 feet activity   Assist Walk 50 feet with 2 turns activity did not occur: Safety/medical concerns         Walk 150 feet activity   Assist Walk 150 feet activity did not occur: Safety/medical concerns         Walk 10 feet on uneven surface  activity   Assist Walk 10 feet on uneven surfaces activity did not occur: Safety/medical concerns         Wheelchair     Assist Is the patient using a wheelchair?: Yes Type of Wheelchair: Manual    Wheelchair assist level: Moderate Assistance - Patient 50 - 74% Max wheelchair distance: 50    Wheelchair 50 feet with 2 turns activity    Assist        Assist Level: Maximal Assistance - Patient 25 - 49%    Wheelchair 150 feet activity     Assist      Assist Level: Total Assistance - Patient < 25%   Blood pressure 128/75, pulse 76, temperature 98.4 F (36.9 C), temperature source Oral, resp. rate 18, height 5\' 6"  (1.676 m), weight 87.7 kg, SpO2 97 %.  Medical Problem List and Plan: 1.  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  LV thrombusAntithrombotics: -DVT/anticoagulation:  Pharmaceutical: Continue Lovenox treatment dose bridge to coumadin.   8/30 INR 2.3 on 8/31             -antiplatelet therapy: N/A 3. Thigh/groin pain: continue tylenol prn for thigh/groin pain.  4. Mood/sleep: LCSW to follow for evaluation and support.   -Added prn trazodone for sleep, improving             -antipsychotic agents: N/A  -add tizanidine 2mg  qhs for spasms 5. Neuropsych: This patient may be capable of making decisions on his own behalf. 6. Skin/Wound Care: Routine pressure relief measures.  7. Fluids/Electrolytes/Nutrition: Monitor I/O. Check lytes in am.  8. HTN: Monitor BP TID--was taking BP meds daily (has online provider) --Continue metoprolol BP controlled on 8/31 9. Acute on chronic renal failure?:               Creatinine 1.89 on 8/29, labs ordered for tomorrow  Encourage fluids 10. Leucocytosis: Likely due to pyuria (UCS negative)  --Treated with Rocephin D#5/5--dc 11. Oxygen need: pt has no history of lung disease that I can fin and wasn't using oxygen at home -wean O2 to off to keep sats greater than 90%  8/29- wean off 12. Hyponatremia:  Na stable at 135  13. Hypocalcemia: Ionized calcium 1.11 -->add calcium supplement.  14. Pre-diabetes: Hgb A1C-5.9.  13. Anorexia/Constipation-  moved bowels this weekend   -sorbitol + SSE  with results   14. BLE spasms? RLS?: Right more affected than left.              --tizanidine 15. ABLA/Right groin hematoma/pseudoaneurysm: Continue to trend H/H with AC on board Hemoglobin 10.0 on 8/29 16. Right knee pain:  continue voltaren gel 17.  Sleep disturbance: increased trazodone to 100mg   Improving       LOS: 8 days A FACE TO FACE EVALUATION WAS PERFORMED  Choice Kleinsasser 9/29 08/03/2021, 11:04 AM

## 2021-08-03 NOTE — Progress Notes (Signed)
ANTICOAGULATION CONSULT NOTE - Follow Up Consult  Pharmacy Consult for Coumadin  Indication:  Apical thrombus  No Known Allergies  Patient Measurements: Height: 5\' 6"  (167.6 cm) Weight: 87.7 kg (193 lb 5.5 oz) IBW/kg (Calculated) : 63.8  Vital Signs: Temp: 98.4 F (36.9 C) (08/31 0503) Temp Source: Oral (08/31 0503) BP: 128/75 (08/31 0503) Pulse Rate: 76 (08/31 0503)  Labs: Recent Labs    08/01/21 0554 08/03/21 0538  HGB 10.0*  --   HCT 30.2*  --   PLT 430*  --   LABPROT 24.2* 25.0*  INR 2.2* 2.3*  CREATININE 1.89*  --      Estimated Creatinine Clearance: 33.4 mL/min (A) (by C-G formula based on SCr of 1.89 mg/dL (H)).   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 No AC pta, CBC stable INR 2.3 remains in goal range.  Goal of Therapy:  INR 2-3 Monitor platelets by anticoagulation protocol: Yes   Plan:  Warfarin 5 mg po daily Change INR to MWF   Nira Visscher S. 08/05/21, PharmD, BCPS Clinical Staff Pharmacist [email or web address]  Merilynn Finland, Dre Gamino Stillinger 08/03/2021,9:42 AM

## 2021-08-03 NOTE — Patient Care Conference (Signed)
Inpatient RehabilitationTeam Conference and Plan of Care Update Date: 08/03/2021   Time: 10:59 AM    Patient Name: Roberto Dickson      Medical Record Number: [id number]  Date of Birth: [birth year] Sex: Male         Room/Bed: 5C08C/5C08C-01 Payor Info: Payor: MEDICARE / Plan: MEDICARE PART A AND B / Product Type: *No Product type* /    Admit Date/Time:  07/26/2021 10:17 PM  Primary Diagnosis:  Acute ischemic left middle cerebral artery (MCA) stroke [facility])  Hospital Problems: Principal Problem:   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HCC) Active Problems:   Sleep disturbance   Acute blood loss anemia   Essential hypertension    Expected Discharge Date: Expected Discharge Date: 08/10/21  Team Members Present: Physician leading conference: Dr. Maryla Morrow Social Worker Present: Lavera Guise, BSW Nurse Present: Chana Bode, RN PT Present: Grier Rocher, PT OT Present: Annye English, OT SLP Present: Eilene Ghazi, SLP PPS Coordinator present : Fae Pippin, SLP     Current Status/Progress Goal Weekly Team Focus  Bowel/Bladder             Swallow/Nutrition/ Hydration             ADL's   set-up for seated grooming, self-feeding; S for UBB + UBD, CGA to min A for ambulatory toilet/shower transfer, toileting tasks, mod A for LBB/LBD, primarily limited by B foot pain/fatigue; R hand brunstrum level II to III  currently set at min A for LBD ADL, CGA for transfers; will upgrade to S for transfers/ADL  DME/AE education, balance, RUE NMR, self-care/transfer retraining, pt/family education,   Mobility   bed mobility = CGA, transfers = CGA/ Min A, gait using RW with CGA  overall CGA  continued NMR for RUE and RLE, standing balance, improved LOA in transfers, initiate stair training, continued gait training, family education   Communication             Safety/Cognition/ Behavioral Observations            Pain             Skin                Discharge Planning:  Patient discharging home with spouse and daughter to provide care. Min A, 24/7   Team Discussion: BP controlled post left MCA CVA. Acute/Chronic kidney failure monitored; enc fluids. Completed Rocephin for pyemia with [place] follow up scheduled. ABLA stabilizing. Ongoing foot pain improved and hand discomfort is better.   Patient on target to meet rehab goals: yes, currently requires set up for grooming and self feeding. Needs supervision for standing and upper body bathing and dressing. Needs CGA for shower transfer with a RW. Requires mod assist for lower body care. Needs CGA for transfers and ambulates with a RW up to 200' with CGA - Min assist occasional supervision assist is needed.   *See Care Plan and progress notes for long and short-term goals.   Revisions to Treatment Plan:  Goals upgraded to supervision for transfers and lower body ADLs   Teaching Needs: Safety, medications, secondary stroke risk management, etc  Current Barriers to Discharge: Decreased caregiver support and Home enviroment access/layout  Possible Resolutions to Barriers: Family education     Medical Summary               I attest that I was present, lead the team conference, and concur with the assessment and plan of the team.  Chana Bode B 08/03/2021, 1:38 PM

## 2021-08-04 LAB — BASIC METABOLIC PANEL
Anion gap: 7 (ref 5–15)
BUN: 29 mg/dL — ABNORMAL HIGH (ref 8–23)
CO2: 23 mmol/L (ref 22–32)
Calcium: 8.3 mg/dL — ABNORMAL LOW (ref 8.9–10.3)
Chloride: 104 mmol/L (ref 98–111)
Creatinine, Ser: 1.8 mg/dL — ABNORMAL HIGH (ref 0.61–1.24)
GFR, Estimated: 38 mL/min — ABNORMAL LOW (ref >60)
Glucose, Bld: 96 mg/dL (ref 70–99)
Potassium: 4.2 mmol/L (ref 3.5–5.1)
Sodium: 134 mmol/L — ABNORMAL LOW (ref 135–145)

## 2021-08-04 NOTE — Progress Notes (Signed)
Occupational Therapy Session Note  Patient Details  Name: Roberto Dickson MRN: [id number] Date of Birth: December 11, 1940  Today's Date: 08/04/2021 OT Individual Time: 4580-9983 OT Individual Time Calculation (min): 53 min + 39 min   Short Term Goals: Week 2:  OT Short Term Goal 1 (Week 2): Pt will don pants with min A + AE PRN. OT Short Term Goal 2 (Week 2): Pt will complete LBB with CGA + AE PRN. OT Short Term Goal 3 (Week 2): Pt will tolerate standing >8 min in prep for standing ADL with CGA. OT Short Term Goal 4 (Week 2): Pt will ind recall >3 theraputty exercises for improved R hand NMR.  Skilled Therapeutic Interventions/Progress Updates:    Session 1 [phone]): Pt received semi-reclined in bed, reports improved B foot pain, agreeable to therapy after finishing breakfast. In person interpreter [name] present throughout. Session focus on self-care retraining, activity tolerance, AE education, func transfers in prep for improved ADL/IADL/func mobility performance + decreased caregiver burden. Came to sitting EOB with close S and increased time + use of elevated bed features. STS and amb > toilet with CGA + RW. Toilet transfer with CGA . Cont void of b/b. Min A for posterior pericare throughness. Entered shower via side-stepping with mod Vcs to recall technique and CGA for balance + use of grab bars/RW. Doffed pants with use of reacher after demonstration and CGA. Req mod A to remove R sock with use of reacher. Doffed shirt with S. Completed full-body bathing with min A to bathe buttocks upon exiting shower, utilized LH sponge to reacher lower BLE. Completed UBD with S, LBD with min A + use of reacher to thread LLE. Completed oral care/grooming tasks in standing with S and assist to set-up materials.  SatO2 at 93% on RA prior to activity, O2 remained doffed with SatO2 at 91% on RA post activity.   Pt left seated in w/c awaiting following PT session with interpreter call bell in reach, and all  immediate needs met.    Session 2 [phone]): Pt received semi-reclined in bed, no c/o pain, agreeable to therapy. Session focus on activity tolerance, RUE NMR, dynamic standing balance in prep for improved ADL/IADL/func mobility performance + decreased caregiver burden. Came to sitting EOB with close S and increased time. Short amb transfer > w/c with CGA + RW + R saddle splint. Total A w/c transport to and from gym 2/2 time management and energy conservation. Seated + in standing, completed 4 total round of cornhole using R hand to toss bag. Pt able to score 61 points in four rounds, keeping track of points with overall min A. CGA for balance throughout. Req min A from L hand to initially secure grasp on bag with R hand. Finally, practiced picking up/stacking soft legs with R hand. Able to stack 6 blocks with increased time, reports task is easier than in previous attempts. SatO2 at 92% on RA post activity. In person interpreter [name] present. Pt left seated in w/c with interpreter present, call bell in reach, and all immediate needs met.    Therapy Documentation Precautions:  Precautions Precautions: Fall Precaution Comments: Right hemi, Spanish speaking, Right groin hematoma Restrictions Weight Bearing Restrictions: No  Pain:  Ongoing B foot pain, did not rate ADL: See Care Tool for more details. Therapy/Group: Individual Therapy  [name] MS, OTR/L  08/04/2021, 6:43 AM

## 2021-08-04 NOTE — Plan of Care (Signed)
  Problem: Consults Goal: RH STROKE PATIENT EDUCATION Description: See Patient Education module for education specifics  Outcome: Progressing   Problem: RH BOWEL ELIMINATION Goal: RH STG MANAGE BOWEL WITH ASSISTANCE Description: STG Manage Bowel with mod I Assistance. Outcome: Progressing Goal: RH STG MANAGE BOWEL W/MEDICATION W/ASSISTANCE Description: STG Manage Bowel with Medication with  mod I Assistance. Outcome: Progressing   Problem: RH BLADDER ELIMINATION Goal: RH STG MANAGE BLADDER WITH ASSISTANCE Description: STG Manage Bladder With mod I Assistance Outcome: Progressing Goal: RH STG MANAGE BLADDER WITH MEDICATION WITH ASSISTANCE Description: STG Manage Bladder With Medication With mod I  Assistance. Outcome: Progressing   Problem: RH SAFETY Goal: RH STG ADHERE TO SAFETY PRECAUTIONS W/ASSISTANCE/DEVICE Description: STG Adhere to Safety Precautions With cues Assistance/Device. Outcome: Progressing   Problem: RH PAIN MANAGEMENT Goal: RH STG PAIN MANAGED AT OR BELOW PT'S PAIN GOAL Description: At or below level 4 Outcome: Progressing   Problem: RH KNOWLEDGE DEFICIT Goal: RH STG INCREASE KNOWLEDGE OF DIABETES Description: Patient will be able to manage DM with medications and dietary modifications using handouts and educational resources independently Outcome: Progressing Goal: RH STG INCREASE KNOWLEDGE OF HYPERTENSION Description: Patient will be able to manage HTN with medications and dietary modifications using handouts and educational resources independently Outcome: Progressing Goal: RH STG INCREASE KNOWLEGDE OF HYPERLIPIDEMIA Description: Patient will be able to manage HLD with medications and dietary modifications using handouts and educational resources independently Outcome: Progressing Goal: RH STG INCREASE KNOWLEDGE OF STROKE PROPHYLAXIS Description: Patient will be able to manage secondary stroke risks with medications and dietary modifications using handouts  and educational resources independently Outcome: Progressing   

## 2021-08-04 NOTE — Progress Notes (Signed)
Patient ID: Roberto Dickson, male   DOB: 09/20/1941, 80 y.o.   MRN: [id number]  New Patient Visit with [name], MD on Wednesday November 09, 2021 2:00 PM   [employer] at [facility] [address] [place] [address] [phone]  Lavera Guise, [place] [phone]

## 2021-08-04 NOTE — Progress Notes (Signed)
Physical Therapy Session Note  Patient Details  Name: Roberto Dickson MRN: [id number] Date of Birth: 1941-11-06  Today's Date: 08/04/2021 PT Individual Time: 0905-1000 and 1350-1415 PT Individual Time Calculation (min): 55 min and 25 min   Short Term Goals: Week 1:  PT Short Term Goal 1 (Week 1): Pt will complete bed mobility with MinA PT Short Term Goal 1 - Progress (Week 1): Met PT Short Term Goal 2 (Week 1): Pt will ambulate 72f or greater with ModA to work towards LTG PT Short Term Goal 2 - Progress (Week 1): Met PT Short Term Goal 3 (Week 1): Pt will consistently complete STS with mod A of one PT Short Term Goal 3 - Progress (Week 1): Met PT Short Term Goal 4 (Week 1): Pt will complete transfers with ModA PT Short Term Goal 4 - Progress (Week 1): Met PT Short Term Goal 5 (Week 1): Pt will initiate stair training PT Short Term Goal 5 - Progress (Week 1): Met Week 2:  PT Short Term Goal 1 (Week 2): STG=LTG due to ELOS  Skilled Therapeutic Interventions/Progress Updates:  Session 1.  Pt received sitting in WC and agreeable to PT. [place]   Ambulatory transfer to nustep with RW and supervision assist. Nustep reciprocal movement training x 6 min cues for attention to the RLE.   Stnading balance with prolonged heel cord stretch from stnading on red wedge while engaged in peg board puzzle. Completed x 2  with min assist for balance. Completed first bout with use of RUE, and desessembled 2nd bout with the RUE. Hand over hand assist to facilitate improved open/close of grasp. Reports only mild tightness in the [place] upon completion.   Gait training in hall with RW and Hand splint x 1552fwith CGA-supervision assist from PT for safety and cues for increased step length and improved posture. Pt noted to have very poor toe off on BLE.   Seated NMR/therex.  Ankle PF with level 2 tband x 15. With over pressure into DF between reps.  Ankle eversion/hip ER x 12 with  resistance to lateral aspect of foot. With level 1 tband.  Hip abduction x 12 with level 2 tband.   Pt returned to room and performed stand pivot transfer to bed with min assist and no AD. Sit>supine completed with supervision assist and left supine in bed with call bell in reach and all needs met.   Session 2.  Pt received sitting in WC and agreeable to PT. Pt [place] with RW over cement sidewalk x 18076fith supervision assist/CGA for clothing management.  Pt performed 5xSTS with UE support on RW and arm rest of WC 23 and 18 sec. Supervision assist from PT.  Pt returned to room and performed stand pivot transfer to bed with no AD and CGA. Sit>supine completed with supervision assist and left supine in bed with call bell in reach and all needs met.         Therapy Documentation Precautions:  Precautions Precautions: Fall Precaution Comments: Right hemi, Spanish speaking, Right groin hematoma Restrictions Weight Bearing Restrictions: No  Pain: Pain Assessment Pain Scale: 0-10 Pain Score: 0-No pain    Therapy/Group: Individual Therapy  AusLorie Phenix1/2022, 10:05 AM

## 2021-08-05 DIAGNOSIS — N1832 Chronic kidney disease, stage 3b: Secondary | ICD-10-CM

## 2021-08-05 DIAGNOSIS — N179 Acute kidney failure, unspecified: Secondary | ICD-10-CM

## 2021-08-05 LAB — PROTIME-INR
INR: 2.1 — ABNORMAL HIGH (ref 0.8–1.2)
Prothrombin Time: 23.4 seconds — ABNORMAL HIGH (ref 11.4–15.2)

## 2021-08-05 MED ORDER — MELATONIN 3 MG PO TABS
1.5000 mg | ORAL_TABLET | Freq: Every day | ORAL | Status: DC
  Administered 2021-08-05 – 2021-08-09 (×5): 1.5 mg via ORAL
  Filled 2021-08-05 (×5): qty 1

## 2021-08-05 MED ORDER — SODIUM CHLORIDE 0.9 % IV SOLN: INTRAVENOUS | Status: AC

## 2021-08-05 NOTE — Plan of Care (Signed)
  Problem: Consults Goal: RH STROKE PATIENT EDUCATION Description: See Patient Education module for education specifics  Outcome: Progressing   Problem: RH BOWEL ELIMINATION Goal: RH STG MANAGE BOWEL WITH ASSISTANCE Description: STG Manage Bowel with mod I Assistance. Outcome: Progressing Goal: RH STG MANAGE BOWEL W/MEDICATION W/ASSISTANCE Description: STG Manage Bowel with Medication with  mod I Assistance. Outcome: Progressing   Problem: RH BLADDER ELIMINATION Goal: RH STG MANAGE BLADDER WITH ASSISTANCE Description: STG Manage Bladder With mod I Assistance Outcome: Progressing Goal: RH STG MANAGE BLADDER WITH MEDICATION WITH ASSISTANCE Description: STG Manage Bladder With Medication With mod I  Assistance. Outcome: Progressing   Problem: RH SAFETY Goal: RH STG ADHERE TO SAFETY PRECAUTIONS W/ASSISTANCE/DEVICE Description: STG Adhere to Safety Precautions With cues Assistance/Device. Outcome: Progressing   Problem: RH PAIN MANAGEMENT Goal: RH STG PAIN MANAGED AT OR BELOW PT'S PAIN GOAL Description: At or below level 4 Outcome: Progressing   Problem: RH KNOWLEDGE DEFICIT Goal: RH STG INCREASE KNOWLEDGE OF DIABETES Description: Patient will be able to manage DM with medications and dietary modifications using handouts and educational resources independently Outcome: Progressing Goal: RH STG INCREASE KNOWLEDGE OF HYPERTENSION Description: Patient will be able to manage HTN with medications and dietary modifications using handouts and educational resources independently Outcome: Progressing Goal: RH STG INCREASE KNOWLEGDE OF HYPERLIPIDEMIA Description: Patient will be able to manage HLD with medications and dietary modifications using handouts and educational resources independently Outcome: Progressing Goal: RH STG INCREASE KNOWLEDGE OF STROKE PROPHYLAXIS Description: Patient will be able to manage secondary stroke risks with medications and dietary modifications using handouts  and educational resources independently Outcome: Progressing

## 2021-08-05 NOTE — Progress Notes (Signed)
Occupational Therapy Session Note  Patient Details  Name: Roberto Dickson MRN: [id number] Date of Birth: 04/25/1941  Today's Date: 08/05/2021 OT Individual Time: 3845-3646 OT Individual Time Calculation (min): 68 min    Short Term Goals: Week 2:  OT Short Term Goal 1 (Week 2): Pt will don pants with min A + AE PRN. OT Short Term Goal 2 (Week 2): Pt will complete LBB with CGA + AE PRN. OT Short Term Goal 3 (Week 2): Pt will tolerate standing >8 min in prep for standing ADL with CGA. OT Short Term Goal 4 (Week 2): Pt will ind recall >3 theraputty exercises for improved R hand NMR.  Skilled Therapeutic Interventions/Progress Updates:    Pt received semi-reclined in bed, no c/o pain, agreeable to therapy. Session focus on self-care retraining, activity tolerance, R NMR, func transfers in prep for improved ADL/IADL/func mobility performance + decreased caregiver burden. Req to shower. Came to sitting EOB with close S and use of bed rail. Total A to don B socks. STS and amb > toilet with RW + close S. Pt able to ind place R hand in saddle splint. S and cues for initiation during LBD clothing management. In-person interpreter [name] present. Light min A for posterior pericare thoroughness post continent void of b/b. Completed shower transfer via side-stepping + use of grab bar and RW, mod Vcs for safety/sequencing + CGA for balance. Bathed full-body with CGA for balance during posterior pericare at STS level, able to utilize LH sponge to bathe B feet. Completed UBD and seated grooming with set-up A. Completed LBD with light min A to thread LLE into brief, able to otherwise thread BLE into pants. In standing, completed oral care with assist to set-up tooth brush and S for balance. Pt completed 1x5 putty squeezes, rolling shapes, digit flexion, pinch and pull exercises with least resistive theraputty using R hand. Issued built up handle for utensils/pencil. Pt able to write his first name 5 times, increasing  from ~50% to 100% legibility.    SatO2 at 94% on RA prior to activity, read at 91% on RA post activity.   Pt left seated in w/c awaiting following PT session with MD/ interpreter present, call bell in reach, and all immediate needs met.    Therapy Documentation Precautions:  Precautions Precautions: Fall Precaution Comments: Right hemi, Spanish speaking, Right groin hematoma Restrictions Weight Bearing Restrictions: No  Pain: no c/o ADL: See Care Tool for more details.  Therapy/Group: Individual Therapy  [name] MS, OTR/L  08/05/2021, 6:41 AM

## 2021-08-05 NOTE — Progress Notes (Signed)
ANTICOAGULATION CONSULT NOTE - Follow Up Consult  Pharmacy Consult for Coumadin  Indication:  Apical thrombus  No Known Allergies  Patient Measurements: Height: 5\' 6"  (167.6 cm) Weight: 87.7 kg (193 lb 5.5 oz) IBW/kg (Calculated) : 63.8  Vital Signs: Temp: 97.9 F (36.6 C) (09/02 0523) Temp Source: Oral (09/02 0523) BP: 136/84 (09/02 0523) Pulse Rate: 89 (09/02 0523)  Labs: Recent Labs    08/03/21 0538 08/04/21 0538 08/05/21 0546  LABPROT 25.0*  --  23.4*  INR 2.3*  --  2.1*  CREATININE  --  1.80*  --      Estimated Creatinine Clearance: 35.1 mL/min (A) (by C-G formula based on SCr of 1.8 mg/dL (H)).   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 No AC pta, CBC stable INR 2.1 remains in goal range.  Goal of Therapy:  INR 2-3 Monitor platelets by anticoagulation protocol: Yes   Plan:  Warfarin 5 mg po daily  Weekly BMET to watch elevated Scr Change INR to twice weekly, Mon/Thurs Target discharge date of 08/10/21.   Greely Atiyeh S. 10/10/21, PharmD, BCPS Clinical Staff Pharmacist [email or web address]  Merilynn Finland, Mitsugi Schrader Stillinger 08/05/2021,7:43 AM

## 2021-08-05 NOTE — Progress Notes (Signed)
PROGRESS NOTE   Subjective/Complaints: Patient seen sitting up in a chair this morning, after working with therapies.  Therapies note good bowel movement this morning.  Interpreter present.  Patient states initially that he slept well overnight, "thank the Lord".  Later states he did not sleep well and wanted to increase his medications.  Sleep chart not updated.  ROS: Denies CP, SOB, N/V/D   Objective:   No results found. No results for input(s): WBC, HGB, HCT, PLT in the last 72 hours.  Recent Labs    08/04/21 0538  NA 134*  K 4.2  CL 104  CO2 23  GLUCOSE 96  BUN 29*  CREATININE 1.80*  CALCIUM 8.3*     Intake/Output Summary (Last 24 hours) at 08/05/2021 1132 Last data filed at 08/05/2021 0805 Gross per 24 hour  Intake 118 ml  Output 1950 ml  Net -1832 ml         Physical Exam: Vital Signs Blood pressure 136/84, pulse 89, temperature 97.9 F (36.6 C), temperature source Oral, resp. rate 16, height 5\' 6"  (1.676 m), weight 87.7 kg, SpO2 94 %. Constitutional: No distress . Vital signs reviewed. HENT: Normocephalic.  Atraumatic. Eyes: EOMI. No discharge. Cardiovascular: No JVD.  RRR. Respiratory: Normal effort.  No stridor.  Bilateral clear to auscultation. GI: Non-distended.  BS +. Skin: Warm and dry.  Intact. Psych: Normal mood.  Normal behavior. Musc: LE edema.  No tenderness in extremities. Neuro: Alert Mild dysarthria, stable Motor: RUE 3-4/5 prox to 2-3/5 at wrist and hand. RLE 3- to 3/5 HF, KE and 1-2/5 ADF/PF. LUE and LLE 5/5 grossly.   Assessment/Plan: 1. Functional deficits which require 3+ hours per day of interdisciplinary therapy in a comprehensive inpatient rehab setting. Physiatrist is providing close team supervision and 24 hour management of active medical problems listed below. Physiatrist and rehab team continue to assess barriers to discharge/monitor patient progress toward functional and  medical goals  Care Tool:  Bathing    Body parts bathed by patient: Right arm, Face, Chest, Abdomen, Front perineal area, Right upper leg, Left upper leg   Body parts bathed by helper: Buttocks, Right lower leg, Left lower leg     Bathing assist Assist Level: Moderate Assistance - Patient 50 - 74%     Upper Body Dressing/Undressing Upper body dressing   What is the patient wearing?: Pull over shirt    Upper body assist Assist Level: Supervision/Verbal cueing    Lower Body Dressing/Undressing Lower body dressing      What is the patient wearing?: Incontinence brief, Pants     Lower body assist Assist for lower body dressing: Moderate Assistance - Patient 50 - 74%     Toileting Toileting    Toileting assist Assist for toileting: Moderate Assistance - Patient 50 - 74%     Transfers Chair/bed transfer  Transfers assist  Chair/bed transfer activity did not occur: Safety/medical concerns  Chair/bed transfer assist level: Minimal Assistance - Patient > 75%     Locomotion Ambulation   Ambulation assist      Assist level: Moderate Assistance - Patient 50 - 74% Assistive device: Walker-rolling Max distance: 15   Walk 10 feet  activity   Assist     Assist level: Moderate Assistance - Patient - 50 - 74% Assistive device: Walker-rolling   Walk 50 feet activity   Assist Walk 50 feet with 2 turns activity did not occur: Safety/medical concerns         Walk 150 feet activity   Assist Walk 150 feet activity did not occur: Safety/medical concerns         Walk 10 feet on uneven surface  activity   Assist Walk 10 feet on uneven surfaces activity did not occur: Safety/medical concerns         Wheelchair     Assist Is the patient using a wheelchair?: Yes Type of Wheelchair: Manual    Wheelchair assist level: Moderate Assistance - Patient 50 - 74% Max wheelchair distance: 50    Wheelchair 50 feet with 2 turns  activity    Assist        Assist Level: Maximal Assistance - Patient 25 - 49%   Wheelchair 150 feet activity     Assist      Assist Level: Total Assistance - Patient < 25%   Blood pressure 136/84, pulse 89, temperature 97.9 F (36.6 C), temperature source Oral, resp. rate 16, height 5\' 6"  (1.676 m), weight 87.7 kg, SpO2 94 %.  Medical Problem List and Plan: 1.  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  LV thrombusAntithrombotics: -DVT/anticoagulation:  Pharmaceutical: Continue Lovenox treatment dose bridge to coumadin.   INR 2.1 on 9/2             -antiplatelet therapy: N/A 3. Thigh/groin pain: continue tylenol prn for thigh/groin pain.  4. Mood/sleep: LCSW to follow for evaluation and support.   -Added prn trazodone for sleep, improving             -antipsychotic agents: N/A  -add tizanidine 2mg  qhs for spasms  Controlled on 9/2 5. Neuropsych: This patient may be capable of making decisions on his own behalf. 6. Skin/Wound Care: Routine pressure relief measures.  7. Fluids/Electrolytes/Nutrition: Monitor I/O. Check lytes in am.  8. HTN: Monitor BP TID--was taking BP meds daily (has online provider) --Continue metoprolol BP controlled on 1/2 9. Acute on chronic renal failure?:               Creatinine 1.80 on 9/1, labs ordered for Monday  IVF x2 nights ordered on 9/2, echo reviewed EF 50-55%  Encourage fluids 10. Leucocytosis: Likely due to pyuria (UCS negative)  --Treated with Rocephin D#5/5--dc 11. Oxygen need: pt has no history of lung disease that I can fin and wasn't using oxygen at home -wean O2 to off to keep sats greater than 90%  8/29- wean off 12. Hyponatremia:  Na stable at 135  13. Hypocalcemia: Ionized calcium 1.11 -->add calcium supplement.  14. Pre-diabetes: Hgb A1C-5.9.  13. Anorexia/Constipation-  moved bowels this weekend   -sorbitol + SSE  with results 14. BLE spasms? RLS?: Right more affected than left.               --tizanidine 15. ABLA/Right groin hematoma/pseudoaneurysm: Continue to trend H/H with AC on board Hemoglobin 10.0 on 8/29, labs 16. Right knee pain: continue voltaren gel 17.  Sleep disturbance: increased trazodone to 100mg   Melatonin started on 9/2   LOS: 10 days A FACE TO FACE EVALUATION WAS PERFORMED  Roberto Dickson 9/29 08/05/2021, 11:32 AM
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
## 2021-08-06 NOTE — Plan of Care (Signed)
  Problem: RH Balance Goal: LTG Patient will maintain dynamic standing with ADLs (OT) Description: LTG:  Patient will maintain dynamic standing balance with assist during activities of daily living (OT)  Flowsheets (Taken 08/06/2021 1237) LTG: Pt will maintain dynamic standing balance during ADLs with: (Goal upgraded 2/2 progress.) Supervision/Verbal cueing Note: Goal upgraded 2/2 progress.   Problem: Sit to Stand Goal: LTG:  Patient will perform sit to stand in prep for activites of daily living with assistance level (OT) Description: LTG:  Patient will perform sit to stand in prep for activites of daily living with assistance level (OT) Flowsheets (Taken 08/06/2021 1237) LTG: PT will perform sit to stand in prep for activites of daily living with assistance level: (Goal upgraded 2/2 progress.) Supervision/Verbal cueing Note: Goal upgraded 2/2 progress.   Problem: RH Toilet Transfers Goal: LTG Patient will perform toilet transfers w/assist (OT) Description: LTG: Patient will perform toilet transfers with assist, with/without cues using equipment (OT) Flowsheets (Taken 08/06/2021 1237) LTG: Pt will perform toilet transfers with assistance level of: (Goal upgraded 2/2 progress.) Supervision/Verbal cueing Note: Goal upgraded 2/2 progress.

## 2021-08-06 NOTE — Progress Notes (Signed)
Occupational Therapy Discharge Summary  Patient Details  Name: Roberto Dickson MRN: [id number] Date of Birth: 10/26/1941   Patient has met 32 of 12 long term goals due to improved activity tolerance, improved balance, postural control, ability to compensate for deficits, functional use of  RIGHT upper extremity, improved awareness, and improved coordination.  Patient to discharge at overall Supervision level.  Patient's care partner is independent to provide the necessary physical and cognitive assistance at discharge.    Reasons goals not met: Pt cont to require assist to open meal items 2/2 decreased functional use of R hand.  Recommendation:  Patient will benefit from ongoing skilled OT services in home health setting to continue to advance functional skills in the area of BADL, iADL, and Reduce care partner burden.  Equipment: 3in1, shower chair  Reasons for discharge: treatment goals met and discharge from hospital  Patient/family agrees with progress made and goals achieved: Yes  OT Discharge Precautions/Restrictions  Precautions Precautions: Fall Precaution Comments: Right hemi, Spanish speaking, Right groin hematoma Restrictions Weight Bearing Restrictions: No  ADL ADL Eating: Set up Where Assessed-Eating: Bed level, Wheelchair Grooming: Modified independent Where Assessed-Grooming: Sitting at sink Upper Body Bathing: Setup Where Assessed-Upper Body Bathing: Shower Lower Body Bathing: Minimal assistance Where Assessed-Lower Body Bathing: Shower Upper Body Dressing: Modified independent (Device) Where Assessed-Upper Body Dressing: Sitting at sink Lower Body Dressing: Minimal assistance Where Assessed-Lower Body Dressing: Sitting at sink Toileting: Minimal assistance Where Assessed-Toileting: Glass blower/designer: Close supervision Toilet Transfer Method: Counselling psychologist: Grab bars, Bedside commode, Raised toilet seat Tub/Shower Transfer:  Not assessed Social research officer, government: Close supervision Social research officer, government Method: Heritage manager: [employer], Civil engineer, contracting with back Vision Baseline Vision/History: 1 Wears glasses Wears Glasses: Reading only Patient Visual Report: No change from baseline Vision Assessment?: No apparent visual deficits Visual Fields: No apparent deficits Perception  Perception: Within Functional Limits Praxis Praxis: Intact Cognition Overall Cognitive Status: Within Functional Limits for tasks assessed Arousal/Alertness: Awake/alert Orientation Level: Oriented X4 Year: 2022 Month: September Day of Week: Correct Memory: Appears intact Immediate Memory Recall: Sock;Blue;Bed Memory Recall Sock: Without Cue Memory Recall Blue: Without Cue Memory Recall Bed: Without Cue Problem Solving: Appears intact Safety/Judgment: Appears intact Comments: Demonstrates safe RW use during ADL Sensation Sensation Light Touch: Appears Intact Hot/Cold: Appears Intact Proprioception: Appears Intact Stereognosis: Impaired by gross assessment Additional Comments: R hemi RUE>RLE Coordination Gross Motor Movements are Fluid and Coordinated: No Fine Motor Movements are Fluid and Coordinated: No Coordination and Movement Description: R hemi Finger Nose Finger Test: Able to complete at slowed speed with use of R fist, unable to isolate digits to complete Motor  Motor Motor: Hemiplegia Motor - Discharge Observations: R hemi Mobility  Bed Mobility Bed Mobility: Sit to Supine;Supine to Sit Supine to Sit: Supervision/Verbal cueing Sit to Supine: Supervision/Verbal cueing Transfers Sit to Stand: Independent with assistive device Stand to Sit: Independent with assistive device  Trunk/Postural Assessment  Cervical Assessment Cervical Assessment: Exceptions to [facility] (forward head) Thoracic Assessment Thoracic Assessment: Exceptions to [facility] (rounded shoulders/kyphotic) Lumbar  Assessment Lumbar Assessment: Exceptions to [facility] (pelvic tilt) Postural Control Postural Control: Deficits on evaluation (decreased with dynamic standing tasks, improved from eval)  Balance Balance Balance Assessed: Yes Static Sitting Balance Static Sitting - Balance Support: Feet supported Static Sitting - Level of Assistance: 6: Modified independent (Device/Increase time) Dynamic Sitting Balance Dynamic Sitting - Balance Support: Feet supported Dynamic Sitting - Level of Assistance: 5: Stand by assistance Dynamic Sitting Balance -  Compensations: S Theatre stage manager Standing - Balance Support: Bilateral upper extremity supported Static Standing - Level of Assistance: 5: Stand by assistance Static Standing - Comment/# of Minutes: S Dynamic Standing Balance Dynamic Standing - Balance Support: Bilateral upper extremity supported Dynamic Standing - Level of Assistance: 5: Stand by assistance Dynamic Standing - Comments: S Extremity/Trunk Assessment RUE Assessment RUE Assessment: Exceptions to [facility] Active Range of Motion (AROM) Comments: 3/4 to full shoulder flexion General Strength Comments: 4-/5 in shoulder flexion, 3/5 grip strength RUE Body System: Neuro Brunstrum levels for arm and hand: Arm;Hand Brunstrum level for arm: Stage V Relative Independence from Synergy Brunstrum level for hand: Stage III Synergies performed voluntarily LUE Assessment LUE Assessment: Within Functional Limits   [name] MS, OTR/L  08/06/2021, 3:15 PM

## 2021-08-06 NOTE — Progress Notes (Signed)
Physical Therapy Session Note  Patient Details  Name: Roberto Dickson MRN: [id number] Date of Birth: [birth year]  Today's Date: 08/06/2021 PT Individual Time: 1402-1500 PT Individual Time Calculation (min): 58 min   Short Term Goals: Week 2:  PT Short Term Goal 1 (Week 2): STG=LTG due to ELOS   Skilled Therapeutic Interventions/Progress Updates:   Pt received sitting in WC following OT treatment, and agreeable to PT. PT treatment session focused on family education.   Gait training with RW x 181ft with supervision assist and cues for safety and AD management in turns to the R.   Stair management with 1 rail and BUE x 4 steps. Cues for step width wit lateral ascent and descent.  Car transfers training with RW and min assist from PT and then from daughter. Cues for sit>pivot technique as well as education for use of "car cane" to have UE support when pushing into standing.   PT instructed pt in HEP of modified [place] level A with hand out provided. UE support on counter throughout standing exercises. Pt reports need for urination. Stand pivot transfer to [facility] over toilet with supervision assist and RW.  Pt received supine in bed and agreeable to PT. Supine>sit transfer with supervision assist and cues for decreased use of rail.          Therapy Documentation Precautions:  Precautions Precautions: Fall Precaution Comments: Right hemi, Spanish speaking, Right groin hematoma Restrictions Weight Bearing Restrictions: No  Vital Signs: Therapy Vitals Temp: 98 F (36.7 C) Temp Source: Oral Pulse Rate: 85 Resp: 16 BP: 136/81 Patient Position (if appropriate): Lying Oxygen Therapy SpO2: 96 % O2 Device: Room Air Pain:  denies   Therapy/Group: Individual Therapy  [name] 08/06/2021, 3:43 PM

## 2021-08-06 NOTE — Progress Notes (Signed)
Occupational Therapy Session Note  Patient Details  Name: Roberto Dickson MRN: [id number] Date of Birth: Jun 01, 1941  Today's Date: 08/06/2021 OT Individual Time: 1301-1400 OT Individual Time Calculation (min): 59 min    Short Term Goals: Week 2:  OT Short Term Goal 1 (Week 2): Pt will don pants with min A + AE PRN. OT Short Term Goal 2 (Week 2): Pt will complete LBB with CGA + AE PRN. OT Short Term Goal 3 (Week 2): Pt will tolerate standing >8 min in prep for standing ADL with CGA. OT Short Term Goal 4 (Week 2): Pt will ind recall >3 theraputty exercises for improved R hand NMR.  Skilled Therapeutic Interventions/Progress Updates:    Pt received seated EOB finishing lunch, no c/o pain, wife and step daughter present for family ed, agreeable to therapy. Session focus on self-care retraining, activity tolerance, func transfers, family education, CVA/falls prevention education  in prep for improved ADL/IADL/func mobility performance + decreased caregiver burden. STS with RW and amb > w/c at S level, pt ind in placing R hand in saddle splint. [employer] interpreter Harriett Sine used throughout. Total A w/c transport to and from gym 2/2 time management and energy conservation. Pt amb at S level with RW in ADL apartment, completed 1 toilet transfer, and simulated walk-in shower transfer via posterior entry with close S. Educated family on need for close S for mobility and safe RW use. Wife with concerns about not being able to catch patient if he falls, emphasized falls prevention strategies and maintaining safety as a caregiver, as well. Issued packet including energy conservation strategies, reducing falls risk, CVA signs/prevention, and the following theraputty exercises:   Exercises Putty Squeezes - 1 x daily - 7 x weekly - 3 sets - 10 reps Tip Pinch with Putty - 1 x daily - 7 x weekly - 3 sets - 10 reps Key Pinch with Putty - 1 x daily - 7 x weekly - 3 sets - 10 reps Rolling Putty on Table - 1 x daily  - 7 x weekly - 3 sets - 10 reps Finger Pinch and Pull with Putty - 1 x daily - 7 x weekly - 3 sets - 10 reps Marble Pick Up from Putty - 1 x daily - 7 x weekly - 3 sets - 10 reps Seated Finger Extension with Putty - 1 x daily - 7 x weekly - 3 sets - 10 reps  Family and pt with no further questions at this time.  Pt left seated in w/c with family present in ADL apartment awaiting following PT session.   Therapy Documentation Precautions:  Precautions Precautions: Fall Precaution Comments: Right hemi, Spanish speaking, Right groin hematoma Restrictions Weight Bearing Restrictions: No  Pain: no c/o   ADL: See Care Tool for more details.  Therapy/Group: Individual Therapy  Claudie Revering MS, OTR/L  08/06/2021, 6:42 AM

## 2021-08-06 NOTE — Progress Notes (Signed)
PROGRESS NOTE   Subjective/Complaints: No complaints this morning  ROS: Denies CP, SOB, N/V/D   Objective:   No results found. No results for input(s): WBC, HGB, HCT, PLT in the last 72 hours.  Recent Labs    08/04/21 0538  NA 134*  K 4.2  CL 104  CO2 23  GLUCOSE 96  BUN 29*  CREATININE 1.80*  CALCIUM 8.3*    Intake/Output Summary (Last 24 hours) at 08/06/2021 1606 Last data filed at 08/06/2021 1325 Gross per 24 hour  Intake 726 ml  Output 1500 ml  Net -774 ml        Physical Exam: Vital Signs Blood pressure 136/81, pulse 85, temperature 98 F (36.7 C), temperature source Oral, resp. rate 16, height 5\' 6"  (1.676 m), weight 87.7 kg, SpO2 96 %. Gen: no distress, normal appearing HEENT: oral mucosa pink and moist, NCAT Cardio: Reg rate Chest: normal effort, normal rate of breathing Abd: soft, non-distended Ext: no edema Psych: pleasant, normal affect Skin: Warm and dry.  Intact. Psych: Normal mood.  Normal behavior. Musc: LE edema.  No tenderness in extremities. Neuro: Alert Mild dysarthria, stable Motor: RUE 3-4/5 prox to 2-3/5 at wrist and hand. RLE 3- to 3/5 HF, KE and 1-2/5 ADF/PF. LUE and LLE 5/5 grossly.   Assessment/Plan: 1. Functional deficits which require 3+ hours per day of interdisciplinary therapy in a comprehensive inpatient rehab setting. Physiatrist is providing close team supervision and 24 hour management of active medical problems listed below. Physiatrist and rehab team continue to assess barriers to discharge/monitor patient progress toward functional and medical goals  Care Tool:  Bathing    Body parts bathed by patient: Right arm, Face, Chest, Abdomen, Front perineal area, Right upper leg, Left upper leg   Body parts bathed by helper: Buttocks, Right lower leg, Left lower leg     Bathing assist Assist Level: Moderate Assistance - Patient 50 - 74%     Upper Body  Dressing/Undressing Upper body dressing   What is the patient wearing?: Pull over shirt    Upper body assist Assist Level: Supervision/Verbal cueing    Lower Body Dressing/Undressing Lower body dressing      What is the patient wearing?: Incontinence brief, Pants     Lower body assist Assist for lower body dressing: Moderate Assistance - Patient 50 - 74%     Toileting Toileting    Toileting assist Assist for toileting: Moderate Assistance - Patient 50 - 74%     Transfers Chair/bed transfer  Transfers assist  Chair/bed transfer activity did not occur: Safety/medical concerns  Chair/bed transfer assist level: Minimal Assistance - Patient > 75%     Locomotion Ambulation   Ambulation assist      Assist level: Moderate Assistance - Patient 50 - 74% Assistive device: Walker-rolling Max distance: 15   Walk 10 feet activity   Assist     Assist level: Moderate Assistance - Patient - 50 - 74% Assistive device: Walker-rolling   Walk 50 feet activity   Assist Walk 50 feet with 2 turns activity did not occur: Safety/medical concerns         Walk 150 feet activity   Assist  Walk 150 feet activity did not occur: Safety/medical concerns         Walk 10 feet on uneven surface  activity   Assist Walk 10 feet on uneven surfaces activity did not occur: Safety/medical concerns         Wheelchair     Assist Is the patient using a wheelchair?: Yes Type of Wheelchair: Manual    Wheelchair assist level: Moderate Assistance - Patient 50 - 74% Max wheelchair distance: 50    Wheelchair 50 feet with 2 turns activity    Assist        Assist Level: Maximal Assistance - Patient 25 - 49%   Wheelchair 150 feet activity     Assist      Assist Level: Total Assistance - Patient < 25%   Blood pressure 136/81, pulse 85, temperature 98 F (36.7 C), temperature source Oral, resp. rate 16, height 5\' 6"  (1.676 m), weight 87.7 kg, SpO2 96  %.  Medical Problem List and Plan: 1.  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  LV thrombusAntithrombotics: continue coumadin             -antiplatelet therapy: N/A 3. Thigh/groin pain: continue tylenol prn for thigh/groin pain.  4. Spasms:  -continue tizanidine 2mg  qhs for spasms  Controlled on 9/3 5. Neuropsych: This patient may be capable of making decisions on his own behalf. 6. Skin/Wound Care: Routine pressure relief measures.  7. Fluids/Electrolytes/Nutrition: Monitor I/O. Check lytes in am.  8. HTN: Monitor BP TID--was taking BP meds daily (has online provider) --Continue metoprolol BP controlled on 1/2 9. Acute on chronic renal failure?:               Creatinine 1.80 on 9/1, labs ordered for Monday  IVF x2 nights ordered on 9/2, echo reviewed EF 50-55%  Encourage fluids 10. Leucocytosis: Likely due to pyuria (UCS negative)  --Treated with Rocephin D#5/5--dc 11. Oxygen need: pt has no history of lung disease that I can fin and wasn't using oxygen at home -wean O2 to off to keep sats greater than 90%  8/29- wean off 12. Hyponatremia:  Na stable at 135  13. Hypocalcemia: Ionized calcium 1.11 -->add calcium supplement.  14. Pre-diabetes: Hgb A1C-5.9.  13. Anorexia/Constipation-  moved bowels this weekend   -sorbitol + SSE  with results 14. BLE spasms? RLS?: Right more affected than left.              --tizanidine 15. ABLA/Right groin hematoma/pseudoaneurysm: Continue to trend H/H with AC on board Hemoglobin 10.0 on 8/29, labs 16. Right knee pain: continue voltaren gel 17.  Sleep disturbance: increased trazodone to 100mg   Melatonin started on 9/2   LOS: 11 days A FACE TO FACE EVALUATION WAS PERFORMED  9/29 P Caramia Boutin 08/06/2021, 4:06 PM

## 2021-08-06 NOTE — Plan of Care (Signed)
  Problem: Consults Goal: RH STROKE PATIENT EDUCATION Description: See Patient Education module for education specifics  Outcome: Progressing   Problem: RH BOWEL ELIMINATION Goal: RH STG MANAGE BOWEL WITH ASSISTANCE Description: STG Manage Bowel with mod I Assistance. Outcome: Progressing Goal: RH STG MANAGE BOWEL W/MEDICATION W/ASSISTANCE Description: STG Manage Bowel with Medication with  mod I Assistance. Outcome: Progressing   Problem: RH BLADDER ELIMINATION Goal: RH STG MANAGE BLADDER WITH ASSISTANCE Description: STG Manage Bladder With mod I Assistance Outcome: Progressing Goal: RH STG MANAGE BLADDER WITH MEDICATION WITH ASSISTANCE Description: STG Manage Bladder With Medication With mod I  Assistance. Outcome: Progressing   Problem: RH SAFETY Goal: RH STG ADHERE TO SAFETY PRECAUTIONS W/ASSISTANCE/DEVICE Description: STG Adhere to Safety Precautions With cues Assistance/Device. Outcome: Progressing   Problem: RH PAIN MANAGEMENT Goal: RH STG PAIN MANAGED AT OR BELOW PT'S PAIN GOAL Description: At or below level 4 Outcome: Progressing   Problem: RH KNOWLEDGE DEFICIT Goal: RH STG INCREASE KNOWLEDGE OF DIABETES Description: Patient will be able to manage DM with medications and dietary modifications using handouts and educational resources independently Outcome: Progressing Goal: RH STG INCREASE KNOWLEDGE OF HYPERTENSION Description: Patient will be able to manage HTN with medications and dietary modifications using handouts and educational resources independently Outcome: Progressing Goal: RH STG INCREASE KNOWLEGDE OF HYPERLIPIDEMIA Description: Patient will be able to manage HLD with medications and dietary modifications using handouts and educational resources independently Outcome: Progressing Goal: RH STG INCREASE KNOWLEDGE OF STROKE PROPHYLAXIS Description: Patient will be able to manage secondary stroke risks with medications and dietary modifications using handouts  and educational resources independently Outcome: Progressing   

## 2021-08-07 NOTE — Plan of Care (Signed)
  Problem: Consults Goal: RH STROKE PATIENT EDUCATION Description: See Patient Education module for education specifics  Outcome: Progressing   Problem: RH BOWEL ELIMINATION Goal: RH STG MANAGE BOWEL WITH ASSISTANCE Description: STG Manage Bowel with mod I Assistance. Outcome: Progressing Goal: RH STG MANAGE BOWEL W/MEDICATION W/ASSISTANCE Description: STG Manage Bowel with Medication with  mod I Assistance. Outcome: Progressing   Problem: RH BLADDER ELIMINATION Goal: RH STG MANAGE BLADDER WITH ASSISTANCE Description: STG Manage Bladder With mod I Assistance Outcome: Progressing Goal: RH STG MANAGE BLADDER WITH MEDICATION WITH ASSISTANCE Description: STG Manage Bladder With Medication With mod I  Assistance. Outcome: Progressing   Problem: RH SAFETY Goal: RH STG ADHERE TO SAFETY PRECAUTIONS W/ASSISTANCE/DEVICE Description: STG Adhere to Safety Precautions With cues Assistance/Device. Outcome: Progressing   Problem: RH PAIN MANAGEMENT Goal: RH STG PAIN MANAGED AT OR BELOW PT'S PAIN GOAL Description: At or below level 4 Outcome: Progressing   Problem: RH KNOWLEDGE DEFICIT Goal: RH STG INCREASE KNOWLEDGE OF DIABETES Description: Patient will be able to manage DM with medications and dietary modifications using handouts and educational resources independently Outcome: Progressing Goal: RH STG INCREASE KNOWLEDGE OF HYPERTENSION Description: Patient will be able to manage HTN with medications and dietary modifications using handouts and educational resources independently Outcome: Progressing Goal: RH STG INCREASE KNOWLEGDE OF HYPERLIPIDEMIA Description: Patient will be able to manage HLD with medications and dietary modifications using handouts and educational resources independently Outcome: Progressing Goal: RH STG INCREASE KNOWLEDGE OF STROKE PROPHYLAXIS Description: Patient will be able to manage secondary stroke risks with medications and dietary modifications using handouts  and educational resources independently Outcome: Progressing   

## 2021-08-08 LAB — BASIC METABOLIC PANEL
Anion gap: 11 (ref 5–15)
BUN: 22 mg/dL (ref 8–23)
CO2: 22 mmol/L (ref 22–32)
Calcium: 8.8 mg/dL — ABNORMAL LOW (ref 8.9–10.3)
Chloride: 103 mmol/L (ref 98–111)
Creatinine, Ser: 1.76 mg/dL — ABNORMAL HIGH (ref 0.61–1.24)
GFR, Estimated: 39 mL/min — ABNORMAL LOW (ref >60)
Glucose, Bld: 89 mg/dL (ref 70–99)
Potassium: 4.2 mmol/L (ref 3.5–5.1)
Sodium: 136 mmol/L (ref 135–145)

## 2021-08-08 LAB — CBC
HCT: 31.2 % — ABNORMAL LOW (ref 39.0–52.0)
Hemoglobin: 10.1 g/dL — ABNORMAL LOW (ref 13.0–17.0)
MCH: 30.4 pg (ref 26.0–34.0)
MCHC: 32.4 g/dL (ref 30.0–36.0)
MCV: 94 fL (ref 80.0–100.0)
Platelets: 476 10*3/uL — ABNORMAL HIGH (ref 150–400)
RBC: 3.32 MIL/uL — ABNORMAL LOW (ref 4.22–5.81)
RDW: 14.1 % (ref 11.5–15.5)
WBC: 7.4 10*3/uL (ref 4.0–10.5)
nRBC: 0 % (ref 0.0–0.2)

## 2021-08-08 LAB — PROTIME-INR
INR: 2 — ABNORMAL HIGH (ref 0.8–1.2)
Prothrombin Time: 22.8 seconds — ABNORMAL HIGH (ref 11.4–15.2)

## 2021-08-08 MED ORDER — METOPROLOL SUCCINATE ER 50 MG PO TB24
50.0000 mg | ORAL_TABLET | Freq: Every day | ORAL | Status: DC
  Administered 2021-08-08 – 2021-08-09 (×2): 50 mg via ORAL
  Filled 2021-08-08 (×2): qty 1

## 2021-08-08 NOTE — Progress Notes (Signed)
Increasing Physical Therapy Session Note  Patient Details  Name: Roberto Dickson MRN: [id number] Date of Birth: [birth year]  Today's Date: 08/08/2021 PT Individual Time: 0802-0902 and 1403-1450 PT Individual Time Calculation (min): 60 min and 47 min  Short Term Goals: Week 2:  PT Short Term Goal 1 (Week 2): STG=LTG due to ELOS  Skilled Therapeutic Interventions/Progress Updates:  Session 1: Patient supine in bed on entrance to room. Patient alert and agreeable to PT session. Pt initially expresses concern over return home and being a burden on his wife. Discussion with pt on CLOF compared with LOF from [facility] as well as overall CLOF. Pt is currently more of a supervision level with intermittent need for physical assist and mainly in the morning but with continued work with [facility] therapies, pt will continue to strengthen and build endurance. For now, pt will need assist to reach far community distances but he does not have a need for much of that at this time.   Pt asked re: wife's understanding of pt's current level and pt relates that wife was very happy to see his progress when she was here for pt education this past Saturday. Wife will also be here this afternoon with pt's dtr. Related to pt that if any of them have questions this afternoon, they will be addressed in afternoon session.   Patient with mild pain complaint at R ankle following longer amb bout but no numerical pain score provided. Related to MD during morning rounding.   Therapeutic Activity: Bed Mobility: Patient performed supine --> sit with supervision/ Mod I requiring extra time and use of bedrail to complete. No cueing required to complete. Transfers: Patient performed sit<>stand and stand pivot transfers throughout session with supervision and use of BUE to assist with power up from w/c. Noted weight shift over LLE in return to sit and addressed with NMR detailed below.   Gait Training:  Patient ambulated 250 ft using RW with  close supervision. Demonstrated slow pace with reduced knee flexion and R ankle. Provided vc/ tc for increasing R knee flexion and heel strike. [name]  Neuromuscular Re-ed: NMR facilitated during session with focus on muscle activation/ motor control, standing balance. Pt guided in minisquats with R sided weight bias in order to increase RLE muscle activation. Blocked practice of Sit <> Stands with focus on no UE assist for powerup and descent to sit with BLE only. NMR performed for improvements in motor control and coordination, balance, sequencing, judgement, and self confidence/ efficacy in performing all aspects of mobility at highest level of independence.   Patient seated  in w/c at end of session with brakes locked, no alarm set with interpreter in room and handoff to next PT.    Session 2: Patient supine in bed on entrance to room. Patient alert and agreeable to PT session. DME has arrived and RW set to height of current RW in prep for ambulation. Addressed pt questions re: logistics of d/c home. Wife and dtr enter room. Wife and dtr with add'l questions re: any upcoming appointments, MyChart setup for pt, DME, HH, and other d/c related questions. Current appointments related from [facility] and new email sent to dtr for setup of pt MyChart in order to better track for pt since she will be the main transporter for all appointments. Demonstrated setup of 3-in-1 for over toilet use. Recommended 2-hr toileting schedule for bladder retraining once at home. Dtr concerned that she has not heard from [facility] agency yet. Related to dtr  that she will hear from them closer to actual d/c date - usually cannot take over care until d/c'd from CIR and will call day of d/c if not the day before. Addressed more concerns from wife and dtr however, overall, they are pleased with pt's progress and ability to mobilize without need for physical assistance as wife will not be able to provide. Related to family that they will be able to  take DME home with them today and just to request a cart from [facility] to assist with transport back to car.   Patient supine  in bed at end of session with brakes locked, bed alarm set, and all needs within reach.   Therapy Documentation Precautions:  Precautions Precautions: Fall Precaution Comments: Right hemi, Spanish speaking, Right groin hematoma Restrictions Weight Bearing Restrictions: No General:   Pain: Mild pain complaint at R ankle in morning session. Addressed with cessation of weight bearing and change in position.  Patient with no pain complaint through second session.  Therapy/Group: Individual Therapy  Loel Dubonnet PT, DPT 08/08/2021, 8:12 AM

## 2021-08-08 NOTE — Progress Notes (Signed)
Occupational Therapy Session Note  Patient Details  Name: Roberto Dickson MRN: [id number] Date of Birth: [birth year]  Today's Date: 08/08/2021 OT Individual Time: 1300-1355 OT Individual Time Calculation (min): 55 min    Short Term Goals: Week 2:  OT Short Term Goal 1 (Week 2): Pt will don pants with min A + AE PRN. OT Short Term Goal 2 (Week 2): Pt will complete LBB with CGA + AE PRN. OT Short Term Goal 3 (Week 2): Pt will tolerate standing >8 min in prep for standing ADL with CGA. OT Short Term Goal 4 (Week 2): Pt will ind recall >3 theraputty exercises for improved R hand NMR.  Skilled Therapeutic Interventions/Progress Updates:    Pt resting in bed upon arrival with interpreter present. OT intervention with focus on bed mobility, functional amb with RW, standing balance, toleting, functional tranfsers, bathing at shower level, and dressing with sit<>stand from set. Supine<>sit EOB with supervision using bed functions. Amb with RW to bathroom with CGA. Toilet transfer and toileting with CGA. Bathing at shower level with min A. UB dressing with supervision. LB dressing with min A. Sit<>stand with CGA. Pt amb with RW into room and stood at sink to comb hair before returned to bed. Pt remained in bed with all needs within reach and bed alarm activated.   Therapy Documentation Precautions:  Precautions Precautions: Fall Precaution Comments: Right hemi, Spanish speaking, Right groin hematoma Restrictions Weight Bearing Restrictions: No  Pain:  Pt denies pain this afternoon   Therapy/Group: Individual Therapy  Rich Brave 08/08/2021, 2:37 PM

## 2021-08-08 NOTE — Progress Notes (Signed)
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 Recent Labs    08/08/21 0618  WBC 7.4  HGB 10.1*  HCT 31.2*  PLT 476*    Recent Labs    08/08/21 0618  NA 136  K 4.2  CL 103  CO2 22  GLUCOSE 89  BUN 22  CREATININE 1.76*  CALCIUM 8.8*     Intake/Output Summary (Last 24 hours) at 08/08/2021 0831 Last data filed at 08/08/2021 0804 Gross per 24 hour  Intake 598 ml  Output 1950 ml  Net -1352 ml         Physical Exam: Vital Signs Blood pressure (!) 146/90, pulse 81, temperature 98.5 F (36.9 C), resp. rate 17, height 5\' 6"  (1.676 m), weight 87.7 kg, SpO2 93 %.  General: No acute distress Mood and affect are appropriate Heart: Regular rate and rhythm no rubs murmurs or extra sounds Lungs: Clear to auscultation, breathing unlabored, no rales or wheezes Abdomen: Positive bowel sounds, soft nontender to palpation, nondistended Extremities: No clubbing, cyanosis, or edema Skin: No evidence of breakdown, no evidence of rash  Musc: RIght ankle without erythema or effusion no pain with ROM but limited ankle DF, inversion and eversion , mild valgus deformity  Neuro: Alert Mild dysarthria, stable Motor: RUE 3-4/5 prox to 2-3/5 at wrist and hand. RLE 3- to 3/5 HF, KE and 1-2/5 ADF/PF. LUE and LLE 5/5 grossly.   Assessment/Plan: 1. Functional deficits which require 3+ hours per day of interdisciplinary therapy in a comprehensive inpatient rehab setting. Physiatrist is providing close team supervision and 24 hour management of active medical problems listed below. Physiatrist and rehab team continue to assess barriers to discharge/monitor patient progress toward functional and medical goals  Care Tool:  Bathing    Body parts bathed by  patient: Right arm, Face, Chest, Abdomen, Front perineal area, Right upper leg, Left upper leg   Body parts bathed by helper: Buttocks, Right lower leg, Left lower leg     Bathing assist Assist Level: Moderate Assistance - Patient 50 - 74%     Upper Body Dressing/Undressing Upper body dressing   What is the patient wearing?: Pull over shirt    Upper body assist Assist Level: Supervision/Verbal cueing    Lower Body Dressing/Undressing Lower body dressing      What is the patient wearing?: Incontinence brief, Pants     Lower body assist Assist for lower body dressing: Moderate Assistance - Patient 50 - 74%     Toileting Toileting    Toileting assist Assist for toileting: Moderate Assistance - Patient 50 - 74%     Transfers Chair/bed transfer  Transfers assist  Chair/bed transfer activity did not occur: Safety/medical concerns  Chair/bed transfer assist level: Minimal Assistance - Patient > 75%     Locomotion Ambulation   Ambulation assist      Assist level: Moderate Assistance - Patient 50 - 74% Assistive device: Walker-rolling Max distance: 15   Walk 10 feet activity  Assist     Assist level: Moderate Assistance - Patient - 50 - 74% Assistive device: Walker-rolling   Walk 50 feet activity   Assist Walk 50 feet with 2 turns activity did not occur: Safety/medical concerns         Walk 150 feet activity   Assist Walk 150 feet activity did not occur: Safety/medical concerns         Walk 10 feet on uneven surface  activity   Assist Walk 10 feet on uneven surfaces activity did not occur: Safety/medical concerns         Wheelchair     Assist Is the patient using a wheelchair?: Yes Type of Wheelchair: Manual    Wheelchair assist level: Moderate Assistance - Patient 50 - 74% Max wheelchair distance: 50    Wheelchair 50 feet with 2 turns activity    Assist        Assist Level: Maximal Assistance - Patient 25 - 49%    Wheelchair 150 feet activity     Assist      Assist Level: Total Assistance - Patient < 25%   Blood pressure (!) 146/90, pulse 81, temperature 98.5 F (36.9 C), resp. rate 17, height 5\' 6"  (1.676 m), weight 87.7 kg, SpO2 93 %.  Medical Problem List and Plan: 1.  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  LV thrombusAntithrombotics: continue coumadin             -antiplatelet therapy: N/A 3. Thigh/groin pain: continue tylenol prn for thigh/groin pain.  4. Spasms:  -continue tizanidine 2mg  qhs for spasms  Controlled on 9/3 5. Neuropsych: This patient may be capable of making decisions on his own behalf. 6. Skin/Wound Care: Routine pressure relief measures.  7. Fluids/Electrolytes/Nutrition: Monitor I/O. Check lytes in am.  8. HTN: Monitor BP TID--was taking BP meds daily (has online provider) --Continue metoprolol Vitals:   08/07/21 1910 08/08/21 0448  BP: (!) 151/89 (!) 146/90  Pulse: 84 81  Resp: 18 17  Temp: 98 F (36.7 C) 98.5 F (36.9 C)  SpO2: 97% 93%   Mildly elevated increase toprol XL to 50mg  9. Acute on chronic renal failure?:               Creatinine 1.80 on 9/1, labs ordered for Monday  IVF x2 nights ordered on 9/2, echo reviewed EF 50-55% Creat 1.76, baseline on admit 1.53 , no other Labs in Epic, may have CKD, will need f/u with PCP 10. Leucocytosis: Likely due to pyuria (UCS negative)  --Treated with Rocephin D#5/5--dc 11. Oxygen need: pt has no history of lung disease that I can fin and wasn't using oxygen at home -wean O2 to off to keep sats greater than 90%  8/29- wean off 12. Hyponatremia:  Na stable at 135  13. Hypocalcemia: Ionized calcium 1.11 -->add calcium supplement.  14. Pre-diabetes: Hgb A1C-5.9.  13. Anorexia/Constipation-  moved bowels this weekend   -sorbitol + SSE  with results 14. RIght ankle pain resolved , hx of "arthritis" may be gout , no sign of acute inflammation  15. ABLA/Right groin  hematoma/pseudoaneurysm: Continue to trend H/H with AC on board Hemoglobin 10.0 on 8/29, labs stable 9/5 16. Right knee pain: continue voltaren gel 17.  Sleep disturbance: increased trazodone to 100mg   Melatonin started on 9/2   LOS: 13 days A FACE TO FACE EVALUATION WAS PERFORMED  9/29 08/08/2021, 8:31 AM

## 2021-08-08 NOTE — Progress Notes (Addendum)
ANTICOAGULATION CONSULT NOTE - Follow Up Consult  Pharmacy Consult for Warfarin Indication:  Apical thrombus  No Known Allergies  Patient Measurements: Height: 5\' 6"  (167.6 cm) Weight: 87.7 kg (193 lb 5.5 oz) IBW/kg (Calculated) : 63.8  Vital Signs: Temp: 98.5 F (36.9 C) (09/05 0448) BP: 146/90 (09/05 0448) Pulse Rate: 81 (09/05 0448)  Labs: Recent Labs    08/08/21 0618  HGB 10.1*  HCT 31.2*  PLT 476*  LABPROT 22.8*  INR 2.0*  CREATININE 1.76*    Estimated Creatinine Clearance: 35.9 mL/min (A) (by C-G formula based on SCr of 1.76 mg/dL (H)).   Medications:  Scheduled:   atorvastatin  40 mg Oral Daily   docusate sodium  100 mg Oral BID   melatonin  1.5 mg Oral QHS   metoprolol succinate  50 mg Oral QHS   pantoprazole  40 mg Oral QHS   polyethylene glycol  17 g Oral Daily   tamsulosin  0.4 mg Oral Daily   tiZANidine  2 mg Oral QHS   warfarin  5 mg Oral q1600   Warfarin - Pharmacist Dosing Inpatient   Does not apply q1600    Assessment: 80 yo male presenting with apical thrombus, no AC PTA.  Pharmacy has been consulted for warfarin dosing.   INR this morning therapeutic at 2.0.  CBC stable.  Scr continuing to trend down.  No signs/symptoms of bleeding noted per RN.  Goal of Therapy:  INR 2-3 Monitor platelets by anticoagulation protocol: Yes   Plan:  Warfarin 5 mg PO daily. Weekly BMET to watch elevated Scr. Check INR twice weekly, Mon/Thurs. Target discharge date of 9/7. Upon discharge, home regimen likely warfarin 5 mg PO daily.    11/7, PharmD PGY1 Pharmacy Resident Phone [phone] 08/08/2021 10:25 AM   Please check AMION for all [facility] Pharmacy phone numbers After 10:00 PM, call Main Pharmacy [phone]

## 2021-08-08 NOTE — Progress Notes (Signed)
Physical Therapy Session Note  Patient Details  Name: Roberto Dickson MRN: [id number] Date of Birth: 1941/07/04  Today's Date: 08/08/2021 PT Individual Time: 0905-1010 PT Individual Time Calculation (min): 65 min   Short Term Goals: Week 1:  PT Short Term Goal 1 (Week 1): Pt will complete bed mobility with MinA PT Short Term Goal 1 - Progress (Week 1): Met PT Short Term Goal 2 (Week 1): Pt will ambulate 84f or greater with ModA to work towards LTG PT Short Term Goal 2 - Progress (Week 1): Met PT Short Term Goal 3 (Week 1): Pt will consistently complete STS with mod A of one PT Short Term Goal 3 - Progress (Week 1): Met PT Short Term Goal 4 (Week 1): Pt will complete transfers with ModA PT Short Term Goal 4 - Progress (Week 1): Met PT Short Term Goal 5 (Week 1): Pt will initiate stair training PT Short Term Goal 5 - Progress (Week 1): Met Week 2:  PT Short Term Goal 1 (Week 2): STG=LTG due to ELOS  Skilled Therapeutic Interventions/Progress Updates:  [facility] handoff w/primary PT. Pt denied pain and was agreeable to PT. Pt transported to main gym w/total A for time management. Sit <>stand from Wc throughout session w/CGA. In gym, pt ascended/descended 8 6" steps w/BUE support on rails, step-to pattern, CGA for safety. Noted pt began descending steps w/LLE, provided correctional cues to lead w/RLE. While pt resting, provided multimodal cues and education regarding which foot to ascend/descend w/and why, pt verbalized understanding. Pt ascended/descended 4 steps w/BUE support on rail and demonstrated correct LE pattern. Pt transported to ortho gym w/total A for time management and performed car transfer w/CGA, verbal cues for safe hand placement. Pt reported high level of fatigue and requested to go back to room, transported w/total A. Once in room, pt reported need to have BM. [facility] <>[facility] and [facility]. Pt  voided continently but BSC was too far forward and pt urinated in floor. Doffed pt's pants and socks and donned new socks/pants w/max A. [facility] and therapist performed peri care w/total A. Pt ambulated 8' from bathroom to sink and stood at sink without UE support for 5 minutes for handwashing, facewashing and brushing hair. Pt ambulated 10' from sink to bed w/RW and CGA and pt performed bed mobility w/supervision. Pt was left supine in bed, all needs in reach.   Therapy Documentation Precautions:  Precautions Precautions: Fall Precaution Comments: Right hemi, Spanish speaking, Right groin hematoma Restrictions Weight Bearing Restrictions: No   Therapy/Group: Individual Therapy JCruzita LedererPlaster, PT, DPT  08/08/2021, 7:43 AM

## 2021-08-08 NOTE — IPOC Note (Signed)
Overall Plan of Care [facility]) Patient Details Name: Roberto Dickson MRN: [id number] DOB: September 14, 1941  Admitting Diagnosis: Acute ischemic left middle cerebral artery (MCA) stroke [facility])  Hospital Problems: Principal Problem:   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HCC) Active Problems:   Sleep disturbance   Acute blood loss anemia   Essential hypertension   AKI (acute kidney injury) (HCC)     Functional Problem List: Nursing Bladder, Medication Management, Endurance, Safety, Bowel, Pain  PT Balance, Skin Integrity, Behavior, Edema, Endurance, Motor, Nutrition, Pain, Perception, Safety, Sensory  OT Balance, Sensory, Endurance, Motor, Skin Integrity, Vision  SLP    TR         Basic ADL's: OT Grooming, Bathing, Eating, Dressing, Toileting     Advanced  ADL's: OT       Transfers: PT Bed Mobility, Bed to Chair, Car, Occupational psychologist, Research scientist (life sciences): PT Ambulation, Psychologist, prison and probation services, Stairs     Additional Impairments: OT Fuctional Use of Upper Extremity  SLP        TR      Anticipated Outcomes Item Anticipated Outcome  Self Feeding mod I  Swallowing      Basic self-care  S  Toileting  min A   Bathroom Transfers CGA  Bowel/Bladder  manage w mod I assist  Transfers  Supervision  Locomotion  CGA with LRAD  Communication     Cognition     Pain  at or below level 4  Safety/Judgment  maintain w cues   Therapy Plan: PT Intensity: Minimum of 1-2 x/day ,45 to 90 minutes PT Frequency: 5 out of 7 days PT Duration Estimated Length of Stay: 21-24 days OT Intensity: Minimum of 1-2 x/day, 45 to 90 minutes OT Frequency: 5 out of 7 days OT Duration/Estimated Length of Stay: 3 to 3.5 weeks     Due to the current state of emergency, patients may not be receiving their 3-hours of Medicare-mandated therapy.   Team Interventions: Nursing Interventions Bladder Management, Bowel Management, Patient/Family Education, Disease  Management/Prevention, Medication Management, Discharge Planning, Pain Management  PT interventions Ambulation/gait training, Discharge planning, Psychosocial support, Functional mobility training, Therapeutic Activities, Visual/perceptual remediation/compensation, Balance/vestibular training, Disease management/prevention, Neuromuscular re-education, Skin care/wound management, Therapeutic Exercise, Wheelchair propulsion/positioning, Cognitive remediation/compensation, DME/adaptive equipment instruction, Pain management, Splinting/orthotics, UE/LE Strength taining/ROM, Community reintegration, Development worker, international aid stimulation, Patient/family education, Museum/gallery curator, UE/LE Coordination activities  OT Interventions Warden/ranger, Disease mangement/prevention, Neuromuscular re-education, Self Care/advanced ADL retraining, Therapeutic Exercise, Wheelchair propulsion/positioning, UE/LE Strength taining/ROM, Skin care/wound managment, Pain management, DME/adaptive equipment instruction, Community reintegration, Functional electrical stimulation, Patient/family education, Splinting/orthotics, UE/LE Coordination activities, Visual/perceptual remediation/compensation, Therapeutic Activities, Psychosocial support, Functional mobility training, Discharge planning  SLP Interventions    TR Interventions    SW/CM Interventions Discharge Planning, Psychosocial Support, Patient/Family Education, Disease Management/Prevention   Barriers to Discharge MD  Medical stability  Nursing Decreased caregiver support apt w elevator w spouse  PT Decreased caregiver support, New oxygen    OT      SLP      SW Neurogenic Bowel & Bladder, Decreased caregiver support     Team Discharge Planning: Destination: PT-Home ,OT- Home , SLP-  Projected Follow-up: PT-Home health PT, OT-  Home health OT, 24 hour supervision/assistance, SLP-  Projected Equipment Needs: PT-To be determined, OT- To be determined, SLP-   Equipment Details: PT- , OT-  Patient/family involved in discharge planning: PT- Patient,  OT-Patient, SLP-   MD ELOS: 14-16d Medical Rehab Prognosis:  Excellent  Assessment: 80 year old RH- male ([place]) with history of HTN otherwise in good health with mild HTN per pt, on meds; who was admitted on 07/19/21 with RUE weakness, left gaze deviation and inability to speak. 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 He  underwent cerebral angio with revascularization of L-ICA from proximal to distal terminus and origin of L-MCA by Dr. Corliss Skains. MRI/MRA brain showed scattered acute infarcts in left cerebellum and both cerebral hemispheres --largest in left posterior frontal region c/w cardiac or aortic source. 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 Follow up ultrasound showed pseudoaneurysm resolve with 2.3 X 3.0 right groin hematoma. He was noted to have rise in WBC to 17.2 with drop in H/H and low grade fevers as well as tachypnea with hypoxia requiring supplemental oxygen.  He was started on IV rocephin due to concerns of UTI.  Fevers have resolved but he continues to be limited by balance deficits with posterior lean, right knee instability, RUE weakness, has difficulty feeding self and performing ADL tasks. CIR recommended due to functional decline.       Pt reports LBM was 8 days ago- denies SOB or aphasia. Poor appetite.     See Team Conference Notes for weekly updates to the plan of care

## 2021-08-09 ENCOUNTER — Ambulatory Visit (HOSPITAL_BASED_OUTPATIENT_CLINIC_OR_DEPARTMENT_OTHER): Payer: Medicare Other | Admitting: Family

## 2021-08-09 NOTE — Plan of Care (Signed)
  Problem: RH Eating Goal: LTG Patient will perform eating w/assist, cues/equip (OT) Description: LTG: Patient will perform eating with assist, with/without cues using equipment (OT) Outcome: Not Met (add Reason) Flowsheets (Taken 08/09/2021 1443) LTG: Pt will perform eating with assistance level of: (Pt cont to req assist to open drink containers 2/2 R hemi.) -- Note: Pt cont to req assist to open drink containers 2/2 R hemi.   Problem: RH Balance Goal: LTG: Patient will maintain dynamic sitting balance (OT) Description: LTG:  Patient will maintain dynamic sitting balance with assistance during activities of daily living (OT) Outcome: Completed/Met Goal: LTG Patient will maintain dynamic standing with ADLs (OT) Description: LTG:  Patient will maintain dynamic standing balance with assist during activities of daily living (OT)  Outcome: Completed/Met   Problem: Sit to Stand Goal: LTG:  Patient will perform sit to stand in prep for activites of daily living with assistance level (OT) Description: LTG:  Patient will perform sit to stand in prep for activites of daily living with assistance level (OT) Outcome: Completed/Met   Problem: RH Grooming Goal: LTG Patient will perform grooming w/assist,cues/equip (OT) Description: LTG: Patient will perform grooming with assist, with/without cues using equipment (OT) Outcome: Completed/Met   Problem: RH Bathing Goal: LTG Patient will bathe all body parts with assist levels (OT) Description: LTG: Patient will bathe all body parts with assist levels (OT) Outcome: Completed/Met   Problem: RH Dressing Goal: LTG Patient will perform upper body dressing (OT) Description: LTG Patient will perform upper body dressing with assist, with/without cues (OT). Outcome: Completed/Met Goal: LTG Patient will perform lower body dressing w/assist (OT) Description: LTG: Patient will perform lower body dressing with assist, with/without cues in positioning using  equipment (OT) Outcome: Completed/Met   Problem: RH Toileting Goal: LTG Patient will perform toileting task (3/3 steps) with assistance level (OT) Description: LTG: Patient will perform toileting task (3/3 steps) with assistance level (OT)  Outcome: Completed/Met   Problem: RH Functional Use of Upper Extremity Goal: LTG Patient will use RT/LT upper extremity as a (OT) Description: LTG: Patient will use right/left upper extremity as a stabilizer/gross assist/diminished/nondominant/dominant level with assist, with/without cues during functional activity (OT) Outcome: Completed/Met   Problem: RH Toilet Transfers Goal: LTG Patient will perform toilet transfers w/assist (OT) Description: LTG: Patient will perform toilet transfers with assist, with/without cues using equipment (OT) Outcome: Completed/Met   Problem: RH Tub/Shower Transfers Goal: LTG Patient will perform tub/shower transfers w/assist (OT) Description: LTG: Patient will perform tub/shower transfers with assist, with/without cues using equipment (OT) Outcome: Completed/Met

## 2021-08-09 NOTE — Progress Notes (Signed)
Patient ID: Roberto Dickson, male   DOB: 1941/02/01, 80 y.o.   MRN: [id number]  Patient declined by [facility], referral center to [facility]. Waiting on follow up  Lavera Guise, [place] [phone]

## 2021-08-09 NOTE — Progress Notes (Signed)
Occupational Therapy Session Note  Patient Details  Name: Roberto Dickson MRN: [id number] Date of Birth: 10/20/1941  Today's Date: 08/09/2021 OT Individual Time: 0801-0856 OT Individual Time Calculation (min): 55 min + 42 min   Short Term Goals: Week 2:  OT Short Term Goal 1 (Week 2): Pt will don pants with min A + AE PRN. OT Short Term Goal 2 (Week 2): Pt will complete LBB with CGA + AE PRN. OT Short Term Goal 3 (Week 2): Pt will tolerate standing >8 min in prep for standing ADL with CGA. OT Short Term Goal 4 (Week 2): Pt will ind recall >3 theraputty exercises for improved R hand NMR.  Skilled Therapeutic Interventions/Progress Updates:    Session 1 (0801-0856): Pt received supine in bed, denies pain, agreeable to therapy. Interpreter David present. Session focus on self-care retraining, activity tolerance, RUE NMR, AE education, func transfers, dynamic standing balance in prep for improved ADL/IADL/func mobility performance + decreased caregiver burden. Req to shower. Came to sitting EOB with mod I + use of bed features/increased time. STS and amb to shower with close S + RW + mod cues for set-up prior to shower transfer. Completed side-step shower transfer with close S. Doffed shirt with ind, doffed brief/pants with S and with use of reacher to remove from ankles. Total A to don/doff socks 2/2 time constraints. Bathed full body with min A to bathe buttocks thoroughly. Donned shirt with ind, ind in recalling hemi-technique. Donned brief with mod A to adjust sizing, light min A to don pants to thread LLE. Completed oral care/grooming while seated with set-up A.   Wrote full name 5x with built-up handle and pencil. Handwriting 95% legible, but pt reports it being at 30/100 % compared to his handwriting before the stroke. Encouraged to cont to practice writing in prep for signing documents, return to painting, etc.    Pt left seated in w/c with interpreter present awaiting following PT sessio,  call bell in reach, and all immediate needs met.     Session 2 (1301-1343): Pt received semi-reclined in bed, no c/o pain, agreeable to therapy. Session focus on func transfers, activity tolerance, RUE NMR, leisure participation in prep for improved ADL/IADL/func mobility performance + decreased caregiver burden. In person interpreter present. Reports lunch having yet to be delivered, requesting for therapy in room in case it comes. Completed bed mobility with mod I. Sit-to-stand and stand-pivot > w/c with close S + use of RW + R saddle splint. To facilitate improved leisure participation + functional use of R hand, pt painted 1/4 of painting with R hand as pt having previously reported enjoying paint-by-numbers. Elected to not use built-up handle. Req consistent cues to stabilize forearm on table to prevent overcompensation of shoulder vs AROM of wrist/digits. Pt interested in continuing painting and purchasing type of paints as he reports his paint set at home will be more challenging (2/2 thicker paints).    Pt left seated in w/c with interpreter present awaiting following PT session, call bell in reach, and all immediate needs met.    Therapy Documentation Precautions:  Precautions Precautions: Fall Precaution Comments: Right hemi, Spanish speaking, Right groin hematoma Restrictions Weight Bearing Restrictions: No  Pain: see sessio notes ADL: See Care Tool for more details.  Therapy/Group: Individual Therapy  Rachel A Stevenson MS, OTR/L  08/09/2021, 6:44 AM 

## 2021-08-09 NOTE — Progress Notes (Signed)
PROGRESS NOTE   Subjective/Complaints: Had left leg pain relieved by tylenol at ~4am.  No swelling no recent trauma  ROS: Denies CP, SOB, N/V/D   Objective:   No results found. Recent Labs    08/08/21 0618  WBC 7.4  HGB 10.1*  HCT 31.2*  PLT 476*     Recent Labs    08/08/21 0618  NA 136  K 4.2  CL 103  CO2 22  GLUCOSE 89  BUN 22  CREATININE 1.76*  CALCIUM 8.8*     Intake/Output Summary (Last 24 hours) at 08/09/2021 0731 Last data filed at 08/09/2021 0728 Gross per 24 hour  Intake 956 ml  Output 1300 ml  Net -344 ml         Physical Exam: Vital Signs Blood pressure 128/79, pulse (!) 57, temperature 98 F (36.7 C), resp. rate 17, height 5\' 6"  (1.676 m), weight 87.7 kg, SpO2 93 %.  General: No acute distress Mood and affect are appropriate Heart: Regular rate and rhythm no rubs murmurs or extra sounds Lungs: Clear to auscultation, breathing unlabored, no rales or wheezes Abdomen: Positive bowel sounds, soft nontender to palpation, nondistended Extremities: No clubbing, cyanosis, or edema Skin: No evidence of breakdown, no evidence of rash   Musc: left leg no swelling erythema or dtenderness in calf , mild pain with ROM in ankle but no swelling or erythema, no TTP  Neuro: Alert Mild dysarthria, stable Motor: RUE 3-4/5 prox to 3/5 at wrist and hand. RLE 3- to 3/5 HF, KE and 3/5 ADF/PF. LUE and LLE 5/5 grossly.   Assessment/Plan: 1. Functional deficits which require 3+ hours per day of interdisciplinary therapy in a comprehensive inpatient rehab setting. Physiatrist is providing close team supervision and 24 hour management of active medical problems listed below. Physiatrist and rehab team continue to assess barriers to discharge/monitor patient progress toward functional and medical goals  Care Tool:  Bathing    Body parts bathed by patient: Right arm, Face, Chest, Abdomen, Front perineal area,  Right upper leg, Left upper leg, Left arm, Buttocks   Body parts bathed by helper: Right lower leg, Left lower leg     Bathing assist Assist Level: Minimal Assistance - Patient > 75%     Upper Body Dressing/Undressing Upper body dressing   What is the patient wearing?: Pull over shirt    Upper body assist Assist Level: Supervision/Verbal cueing    Lower Body Dressing/Undressing Lower body dressing      What is the patient wearing?: Incontinence brief, Pants     Lower body assist Assist for lower body dressing: Minimal Assistance - Patient > 75%     Toileting Toileting    Toileting assist Assist for toileting: Contact Guard/Touching assist     Transfers Chair/bed transfer  Transfers assist  Chair/bed transfer activity did not occur: Safety/medical concerns  Chair/bed transfer assist level: Contact Guard/Touching assist     Locomotion Ambulation   Ambulation assist      Assist level: Contact Guard/Touching assist Assistive device: Walker-rolling Max distance: 250   Walk 10 feet activity   Assist     Assist level: Contact Guard/Touching assist Assistive device: Walker-rolling  Walk 50 feet activity   Assist Walk 50 feet with 2 turns activity did not occur: Safety/medical concerns  Assist level: Supervision/Verbal cueing Assistive device: Walker-rolling    Walk 150 feet activity   Assist Walk 150 feet activity did not occur: Safety/medical concerns  Assist level: Supervision/Verbal cueing Assistive device: Walker-rolling    Walk 10 feet on uneven surface  activity   Assist Walk 10 feet on uneven surfaces activity did not occur: Safety/medical concerns   Assist level: Contact Guard/Touching assist Assistive device: Walker-rolling   Wheelchair     Assist Is the patient using a wheelchair?: No Type of Wheelchair: Manual    Wheelchair assist level: Moderate Assistance - Patient 50 - 74% Max wheelchair distance: 50     Wheelchair 50 feet with 2 turns activity    Assist        Assist Level: Maximal Assistance - Patient 25 - 49%   Wheelchair 150 feet activity     Assist      Assist Level: Total Assistance - Patient < 25%   Blood pressure 128/79, pulse (!) 57, temperature 98 F (36.7 C), resp. rate 17, height 5\' 6"  (1.676 m), weight 87.7 kg, SpO2 93 %.  Medical Problem List and Plan: 1.  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  LV thrombusAntithrombotics: continue coumadin             -antiplatelet therapy: N/A 3. Thigh/groin pain: continue tylenol prn for thigh/groin pain.  4. Spasms:  -continue tizanidine 2mg  qhs for spasms  Controlled on 9/3 5. Neuropsych: This patient may be capable of making decisions on his own behalf. 6. Skin/Wound Care: Routine pressure relief measures.  7. Fluids/Electrolytes/Nutrition: Monitor I/O. Labs ok d/c IV 8. HTN: Monitor BP TID--was taking BP meds daily (has online provider) --Continue metoprolol Vitals:   08/08/21 1912 08/09/21 0505  BP: (!) 143/94 128/79  Pulse: 88 (!) 57  Resp: 16 17  Temp: 98.2 F (36.8 C) 98 F (36.7 C)  SpO2: 96% 93%   Mildly elevated increase toprol XL to 50mg , HR in 50s this am monitor for sx 9. Acute on chronic renal failure?:               Creatinine 1.80 on 9/1, labs ordered for Monday  IVF x2 nights ordered on 9/2, echo reviewed EF 50-55% Creat 1.76, baseline on admit 1.53 , no other Labs in Epic, may have CKD, will need f/u with PCP 10. Leucocytosis: Likely due to pyuria (UCS negative)  --Treated with Rocephin D#5/5--dc 11. Oxygen need: pt has no history of lung disease that I can fin and wasn't using oxygen at home -wean O2 to off to keep sats greater than 90%  8/29- wean off 12. Hyponatremia:  Na stable at 135  13. Hypocalcemia: Ionized calcium 1.11 -->add calcium supplement.  14. Pre-diabetes: Hgb A1C-5.9.  13. Anorexia/Constipation-  moved bowels this  weekend   -sorbitol + SSE  with results 14. RIght ankle pain resolved , hx of "arthritis" may be gout , no sign of acute inflammation  Left leg pain sounds like muscular pain in calf but resolved and exam unremarkable no further w/u needed  15. ABLA/Right groin hematoma/pseudoaneurysm: Continue to trend H/H with AC on board Hemoglobin 10.0 on 8/29, labs stable 9/5 16. Right knee pain: continue voltaren gel 17.  Sleep disturbance: increased trazodone to 100mg   Melatonin started on 9/2   LOS: 14 days A FACE  TO FACE EVALUATION WAS PERFORMED  Erick Colace 08/09/2021, 7:31 AM

## 2021-08-09 NOTE — Plan of Care (Signed)
Nutrition Education Note  RD consulted for nutrition education regarding uncontrolled HTN and HLD.  RD provided "Low Sodium Nutrition Therapy" handout from the Academy [employer] in Spanish. Reviewed patient's dietary recall. Provided examples on ways to decrease sodium intake in diet. Discouraged intake of processed foods and use of salt shaker. Encouraged fresh fruits and vegetables as well as whole grain sources of carbohydrates to maximize fiber intake.   Patient is Spanish-speaking, used interpreter, Gaffer [phone]) via iPad. Pt already familiar with diet to some degree from discussions with other providers. Family (wife and daughter) are handling most of the education and information regarding his diet. Pt very appreciative of information and Spanish handout for family to read later.  RD discussed why it is important for patient to adhere to diet recommendations, and emphasized the role of fluids, foods to avoid, and importance of weighing self daily. Teach back method used.  Expect fair compliance.  Body mass index is 31.21 kg/m. Pt meets criteria for Obesity Class 1 based on current BMI.  Current diet order is Heart Healthy, patient is consuming approximately 40-100% of meals at this time. Labs and medications reviewed.   No further nutrition interventions warranted at this time. RD contact information provided. If additional nutrition issues arise, please re-consult RD.   Vertell Limber, RD, LDN (she/her/hers) Registered Dietitian I After-Hours/Weekend Pager # in [place]

## 2021-08-10 ENCOUNTER — Other Ambulatory Visit: Payer: Self-pay

## 2021-08-10 DIAGNOSIS — I63512 Cerebral infarction due to unspecified occlusion or stenosis of left middle cerebral artery: Secondary | ICD-10-CM

## 2021-08-10 MED ORDER — POLYETHYLENE GLYCOL 3350 17 G PO PACK: 17.0000 g | PACK | Freq: Every day | ORAL | 0 refills | Status: DC

## 2021-08-10 MED ORDER — WARFARIN SODIUM 6 MG PO TABS: 6.0000 mg | ORAL_TABLET | Freq: Every day | ORAL | 0 refills | Status: DC

## 2021-08-10 MED ORDER — TAMSULOSIN HCL 0.4 MG PO CAPS: 0.4000 mg | ORAL_CAPSULE | Freq: Every day | ORAL | 0 refills | Status: DC

## 2021-08-10 MED ORDER — METOPROLOL SUCCINATE ER 50 MG PO TB24: 50.0000 mg | ORAL_TABLET | Freq: Every day | ORAL | 0 refills | Status: DC

## 2021-08-10 MED ORDER — PANTOPRAZOLE SODIUM 40 MG PO TBEC: 40.0000 mg | DELAYED_RELEASE_TABLET | Freq: Every day | ORAL | 0 refills | Status: DC

## 2021-08-10 MED ORDER — MELATONIN 3 MG PO TABS: 1.5000 mg | ORAL_TABLET | Freq: Every day | ORAL | 0 refills | Status: DC

## 2021-08-10 MED ORDER — TRAZODONE HCL 100 MG PO TABS: 100.0000 mg | ORAL_TABLET | Freq: Every evening | ORAL | 0 refills | Status: DC | PRN

## 2021-08-10 MED ORDER — TIZANIDINE HCL 2 MG PO TABS
2.0000 mg | ORAL_TABLET | Freq: Every day | ORAL | 0 refills | Status: DC
Start: 2021-08-10 — End: 2021-08-25

## 2021-08-10 MED ORDER — ATORVASTATIN CALCIUM 40 MG PO TABS: 40.0000 mg | ORAL_TABLET | Freq: Every day | ORAL | 0 refills | Status: DC

## 2021-08-10 MED ORDER — DOCUSATE SODIUM 100 MG PO CAPS: 100.0000 mg | ORAL_CAPSULE | Freq: Two times a day (BID) | ORAL | 0 refills | Status: DC

## 2021-08-10 MED ORDER — ACETAMINOPHEN 325 MG PO TABS: 325.0000 mg | ORAL_TABLET | ORAL | Status: DC | PRN

## 2021-08-10 NOTE — Progress Notes (Signed)
Inpatient Rehabilitation Care Coordinator Discharge Note   Patient Details  Name: Roberto Dickson MRN: [id number] Date of Birth: 07/20/1941   Discharge location: Home  Length of Stay: 15 Days  Discharge activity level: Supervision  Home/community participation: Daughter able to provide assistance in the home  Patient response ON:GEXBMW Literacy - How often do you need to have someone help you when you read instructions, pamphlets, or other written material from your doctor or pharmacy?: Always  Patient response UX:LKGMWN Isolation - How often do you feel lonely or isolated from those around you?: Rarely  Services provided included: MD, RD, PT, OT, SLP, RN, CM, TR, Pharmacy, Neuropsych, SW  Financial Services:  Field seismologist Utilized: Medicare    Choices offered to/list presented to: pt daughter  Follow-up services arranged:  Home Health Home Health Agency: [facility]         Patient response to transportation need: Is the patient able to respond to transportation needs?: Yes In the past 12 months, has lack of transportation kept you from medical appointments or from getting medications?: No In the past 12 months, has lack of transportation kept you from meetings, work, or from getting things needed for daily living?: No    Comments (or additional information):  Patient/Family verbalized understanding of follow-up arrangements:  Yes  Individual responsible for coordination of the follow-up plan: Pt daughter: Byrd Hesselbach [phone]  Confirmed correct DME delivered: Andria Rhein 08/10/2021    Andria Rhein

## 2021-08-10 NOTE — Progress Notes (Signed)
PROGRESS NOTE   Subjective/Complaints: Seen with RN Fabiola who interpreted spanish No LE pains today , no other c/os ROS: Denies CP, SOB, N/V/D   Objective:   No results found. Recent Labs    08/08/21 0618  WBC 7.4  HGB 10.1*  HCT 31.2*  PLT 476*     Recent Labs    08/08/21 0618  NA 136  K 4.2  CL 103  CO2 22  GLUCOSE 89  BUN 22  CREATININE 1.76*  CALCIUM 8.8*     Intake/Output Summary (Last 24 hours) at 08/10/2021 0805 Last data filed at 08/10/2021 0801 Gross per 24 hour  Intake 240 ml  Output 1125 ml  Net -885 ml         Physical Exam: Vital Signs Blood pressure 137/81, pulse 68, temperature 98 F (36.7 C), temperature source Oral, resp. rate 18, height 5\' 6"  (1.676 m), weight 87.7 kg, SpO2 98 %.  General: No acute distress Mood and affect are appropriate Heart: Regular rate and rhythm no rubs murmurs or extra sounds Lungs: Clear to auscultation, breathing unlabored, no rales or wheezes Abdomen: Positive bowel sounds, soft nontender to palpation, nondistended Extremities: No clubbing, cyanosis, or edema Skin: No evidence of breakdown, no evidence of rash     Musc: left leg no swelling erythema or dtenderness in calf , mild pain with ROM in ankle but no swelling or erythema, no TTP  Neuro: Alert Mild dysarthria, stable Motor: RUE 3-4/5 prox to 3/5 at wrist and hand. RLE 3- to 3/5 HF, KE and 3/5 ADF/PF. LUE and LLE 5/5 grossly.   Assessment/Plan: 1. 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  Stable for D/C today F/u PCP in 3-4 weeks F/u PM&R 2 weeks F/u neuro 1-2 mo See D/C summary See D/C instructions   Care Tool:  Bathing    Body parts bathed by patient: Right arm, Face, Chest, Abdomen, Front perineal area, Right upper leg, Left upper leg, Left arm, Buttocks   Body parts bathed by helper: Buttocks, Right lower leg, Left lower leg     Bathing assist Assist Level:  Minimal Assistance - Patient > 75%     Upper Body Dressing/Undressing Upper body dressing   What is the patient wearing?: Pull over shirt    Upper body assist Assist Level: Independent    Lower Body Dressing/Undressing Lower body dressing      What is the patient wearing?: Incontinence brief, Pants     Lower body assist Assist for lower body dressing: Minimal Assistance - Patient > 75%     Toileting Toileting    Toileting assist Assist for toileting: Minimal Assistance - Patient > 75%     Transfers Chair/bed transfer  Transfers assist  Chair/bed transfer activity did not occur: Safety/medical concerns  Chair/bed transfer assist level: Supervision/Verbal cueing     Locomotion Ambulation   Ambulation assist      Assist level: Supervision/Verbal cueing Assistive device: Walker-rolling Max distance: 250 ft   Walk 10 feet activity   Assist     Assist level: Supervision/Verbal cueing Assistive device: Walker-rolling   Walk 50 feet activity   Assist Walk 50 feet with 2  turns activity did not occur: Safety/medical concerns  Assist level: Supervision/Verbal cueing Assistive device: Walker-rolling    Walk 150 feet activity   Assist Walk 150 feet activity did not occur: Safety/medical concerns  Assist level: Supervision/Verbal cueing Assistive device: Walker-rolling    Walk 10 feet on uneven surface  activity   Assist Walk 10 feet on uneven surfaces activity did not occur: Safety/medical concerns   Assist level: Contact Guard/Touching assist Assistive device: Walker-rolling   Wheelchair     Assist Is the patient using a wheelchair?: No Type of Wheelchair: Manual    Wheelchair assist level: Moderate Assistance - Patient 50 - 74%, Contact Guard/Touching assist Max wheelchair distance: 50    Wheelchair 50 feet with 2 turns activity    Assist        Assist Level: Minimal Assistance - Patient > 75%   Wheelchair 150 feet  activity     Assist      Assist Level: Moderate Assistance - Patient 50 - 74%   Blood pressure 137/81, pulse 68, temperature 98 F (36.7 C), temperature source Oral, resp. rate 18, height 5\' 6"  (1.676 m), weight 87.7 kg, SpO2 98 %.  Medical Problem List and Plan: 1.  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  LV thrombusAntithrombotics: continue coumadin             -antiplatelet therapy: N/A 3. Thigh/groin pain: continue tylenol prn for thigh/groin pain.  4. Spasms:  -continue tizanidine 2mg  qhs for spasms  Controlled on 9/3 5. Neuropsych: This patient may be capable of making decisions on his own behalf. 6. Skin/Wound Care: Routine pressure relief measures.  7. Fluids/Electrolytes/Nutrition: Monitor I/O. Labs ok d/c IV 8. HTN: Monitor BP TID--was taking BP meds daily (has online provider) --Continue metoprolol Vitals:   08/09/21 2012 08/10/21 0511  BP: 136/80 137/81  Pulse: (!) 104 68  Resp: 18 18  Temp: 97.8 F (36.6 C) 98 F (36.7 C)  SpO2: 94% 98%   Mildly elevated increase toprol XL to 50mg , HR variable but no bradycardia  9. Acute on chronic renal failure?:               Creatinine 1.80 on 9/1, labs ordered for Monday  IVF x2 nights ordered on 9/2, echo reviewed EF 50-55% Creat 1.76, baseline on admit 1.53 , no other Labs in Epic, may have CKD, will need f/u with PCP 10. Leucocytosis: Likely due to pyuria (UCS negative)  --Treated with Rocephin D#5/5--dc 11. Oxygen need: pt has no history of lung disease that I can fin and wasn't using oxygen at home -wean O2 to off to keep sats greater than 90%  8/29- wean off 12. Hyponatremia:  Na stable at 135  13. Hypocalcemia: Ionized calcium 1.11 -->add calcium supplement.  14. Pre-diabetes: Hgb A1C-5.9.  13. Anorexia/Constipation-  moved bowels this weekend   -sorbitol + SSE  with results 14. RIght ankle pain resolved , hx of "arthritis" may be gout , no sign of acute inflammation  Left leg  pain sounds like muscular pain in calf but resolved and exam unremarkable no further w/u needed  15. ABLA/Right groin hematoma/pseudoaneurysm: Continue to trend H/H with AC on board Hemoglobin 10.0 on 8/29, labs stable 9/5 16. Right knee pain: continue voltaren gel 17.  Sleep disturbance: increased trazodone to 100mg   Melatonin started on 9/2   LOS: 15 days A FACE TO FACE EVALUATION WAS PERFORMED  9/29 08/10/2021, 8:05 AM

## 2021-08-10 NOTE — Progress Notes (Signed)
Physical Therapy Discharge Summary  Patient Details  Name: Roberto Dickson MRN: [id number] Date of Birth: 1941-02-03  Today's Date: 08/09/2021 PT Individual Time: 0902-1000 and 1346-1420  PT Individual Time Calculation (min): 58 min and 34 min     Patient has met 9 of 10 long term goals due to improved activity tolerance, improved balance, improved postural control, increased strength, decreased pain, ability to compensate for deficits, functional use of  right upper extremity and right lower extremity, improved awareness, and improved coordination.  Patient to discharge at an ambulatory level Supervision.   Patient's care partner is independent to provide the necessary  supervisory  assistance at discharge.  Reasons goals not met: W/c mobility goal deemed inappropriate as pt is discharging home at ambulatory level.   Recommendation:  Patient will benefit from ongoing skilled PT services in home health setting to continue to advance safe functional mobility, address ongoing impairments in strength, coordination, balance, activity tolerance, cognition, safety awareness, and to minimize fall risk.  Equipment: RW  Reasons for discharge: treatment goals met and discharge from hospital  Patient/family agrees with progress made and goals achieved: Yes  PT Discharge Precautions/Restrictions Precautions Precautions: Fall Precaution Comments: Right hemi, Spanish speaking, Right groin hematoma Vital Signs Therapy Vitals Temp: 98 F (36.7 C) Temp Source: Oral Pulse Rate: 68 Resp: 18 BP: 137/81 Patient Position (if appropriate): Lying Oxygen Therapy SpO2: 98 % O2 Device: Room Air Pain Pain Assessment Pain Scale: 0-10 Pain Score: 0-No pain Pain Interference Pain Interference Pain Effect on Sleep: 2. Occasionally Pain Interference with Therapy Activities: 1. Rarely or not at all;2. Occasionally Pain Interference with Day-to-Day Activities: 2. Occasionally Vision/Perception   Vision - History Ability to See in Adequate Light: 0 Adequate Vision - Assessment Eye Alignment: Within Functional Limits Ocular Range of Motion: Within Functional Limits Perception Perception: Within Functional Limits Praxis Praxis: Intact  Cognition Overall Cognitive Status: Within Functional Limits for tasks assessed Arousal/Alertness: Awake/alert Orientation Level: Oriented X4 Memory: Appears intact Safety/Judgment: Appears intact Sensation Sensation Light Touch: Appears Intact Additional Comments: R hemi RUE>RLE Coordination Gross Motor Movements are Fluid and Coordinated: No Fine Motor Movements are Fluid and Coordinated: No Coordination and Movement Description: R hemipareisis Heel Shin Test: Requires extra time and focus for completion Motor  Motor Motor: Other (comment) (Hemipareisis) Motor - Discharge Observations: R hemipareisis with RUE weaker than RLE  Mobility Bed Mobility Bed Mobility: Sit to Supine;Supine to Sit Rolling Right: Independent with assistive device Rolling Left: Independent with assistive device Supine to Sit: Independent with assistive device Sit to Supine: Independent with assistive device Transfers Transfers: Sit to Stand;Stand Pivot Transfers;Stand to Sit Sit to Stand: Independent with assistive device Stand to Sit: Independent with assistive device Stand Pivot Transfers: Supervision/Verbal cueing;Independent with assistive device Transfer (Assistive device): Rolling walker Locomotion  Gait Ambulation: Yes Gait Assistance: Supervision/Verbal cueing Gait Distance (Feet): 250 Feet Assistive device: Rolling walker Gait Assistance Details: Verbal cues for gait pattern Gait Gait: Yes Gait Pattern: Impaired Gait Pattern: Decreased dorsiflexion - right;Decreased hip/knee flexion - right Gait velocity: decreased Stairs / Additional Locomotion Stairs: Yes Stairs Assistance: Supervision/Verbal cueing Stair Management Technique: Two  rails Number of Stairs: 8 Height of Stairs: 6 Ramp: Supervision/Verbal cueing Curb: Supervision/Verbal cueing Wheelchair Mobility Wheelchair Mobility: No  Trunk/Postural Assessment  Cervical Assessment Cervical Assessment: Exceptions to [facility] (forward head) Thoracic Assessment Thoracic Assessment: Exceptions to [facility] (rounded shoulders with min kyphosis) Lumbar Assessment Lumbar Assessment: Exceptions to [facility] (posterior pelvic tilt) Postural Control Postural Control: Within Functional Limits  Balance Balance Balance Assessed: Yes Standardized Balance Assessment Standardized Balance Assessment: Berg Balance Test;Timed Up and Go Test Berg Balance Test Sit to Stand: Able to stand  independently using hands Standing Unsupported: Able to stand safely 2 minutes Sitting with Back Unsupported but Feet Supported on Floor or Stool: Able to sit safely and securely 2 minutes Stand to Sit: Controls descent by using hands Transfers: Able to transfer safely, definite need of hands Standing Unsupported with Eyes Closed: Able to stand 10 seconds with supervision Standing Ubsupported with Feet Together: Able to place feet together independently and stand for 1 minute with supervision From Standing, Reach Forward with Outstretched Arm: Can reach confidently >25 cm (10") From Standing Position, Pick up Object from Floor: Able to pick up shoe, needs supervision From Standing Position, Turn to Look Behind Over each Shoulder: Turn sideways only but maintains balance Turn 360 Degrees: Able to turn 360 degrees safely but slowly Standing Unsupported, Alternately Place Feet on Step/Stool: Able to complete >2 steps/needs minimal assist Standing Unsupported, One Foot in Front: Able to take small step independently and hold 30 seconds Standing on One Leg: Tries to lift leg/unable to hold 3 seconds but remains standing independently Total Score: 38 Timed Up and Go Test TUG: Normal TUG Normal TUG (seconds):  32.68 Static Sitting Balance Static Sitting - Balance Support: Feet supported Static Sitting - Level of Assistance: 6: Modified independent (Device/Increase time) Dynamic Sitting Balance Dynamic Sitting - Balance Support: Feet supported Dynamic Sitting - Level of Assistance: 5: Stand by assistance Dynamic Sitting - Balance Activities: Forward lean/weight shifting;Reaching across midline;Reaching for objects;Lateral lean/weight shifting;Reaching for weighted objects Static Standing Balance Static Standing - Balance Support: Bilateral upper extremity supported Static Standing - Level of Assistance: 6: Modified independent (Device/Increase time) Dynamic Standing Balance Dynamic Standing - Balance Support: Bilateral upper extremity supported Dynamic Standing - Level of Assistance: 5: Stand by assistance Dynamic Standing - Balance Activities: Lateral lean/weight shifting;Forward lean/weight shifting;Reaching for objects;Reaching for weighted objects;Reaching across midline;Compliant surfaces Extremity Assessment      RLE Strength Right Hip Flexion: 4/5 Right Hip Extension: 4-/5 Right Hip ABduction: 4/5 Right Hip ADduction: 4/5 Right Knee Flexion: 4-/5 Right Knee Extension: 4/5 Right Ankle Dorsiflexion: 4-/5 Right Ankle Plantar Flexion: 4-/5 LLE Assessment LLE Assessment: Exceptions to [facility] LLE Strength Left Hip Flexion: 4+/5 Left Hip Extension: 4/5 Left Hip ABduction: 4+/5 Left Hip ADduction: 4+/5 Left Knee Flexion: 4+/5 Left Knee Extension: 4+/5 Left Ankle Dorsiflexion: 4+/5 Left Ankle Plantar Flexion: 4+/5  PT instructed pt in Grad day assessment to measure progress toward goals. See above/below for details.  Session 1: Patient supine in bed on entrance to room. Patient alert and agreeable to PT session. In person interpreter, [name], present for entire session. Pt working R hand with molding clay on tray table. Pt asks what other things he can do with the clay in order to  continue to work on his hand dexterity on return home. Pt guided in pinch of clay down rolled tube, push with fingertips to flatten with push from fingertips rather than whole arm, grasping of rolled tube to twist into shapes.   Patient with no pain complaint throughout session.  Therapeutic Activity: Bed Mobility: Patient performed supine <> sit with Mod I requiring extra time and use of bedrail.  Transfers: Patient performed sit<>stand and stand pivot transfers throughout session with supervision/ Mod I. Provided verbal cues for forward lean in rise to stand.  Gait Training:  Patient ambulated 125 ft using RW with  close supervision and w/c follow for fatigue. Provided vc/ tc for increased Bil hip/ knee flexion for improved step height as well as to increase heel strike bilaterally.  Neuromuscular Re-ed: NMR facilitated during session with focus on standing balance and fall risk assessment. Pt guided in completion of Berg Balance testing and TUG performance. Patient demonstrates increased fall risk as noted by score of  38 /56 on Berg Balance Scale.  (<36= high risk for falls, close to 100%; 37-45 significant >80%; 46-51 moderate >50%; 52-55 lower >25%).   TUG completed in 32.68 seconds (A score of >13.5 seconds indicates patient is at a high fall risk. Pt educated on interpretation of their score) .  NMR performed for improvements in motor control and coordination, balance, sequencing, judgement, and self confidence/ efficacy in performing all aspects of mobility at highest level of independence.   Patient seated upright  in w/c at end of session with brakes locked, belt alarm set, and all needs within reach.  Session 2: Patient seated upright in w/c on entrance to room. Pt using RUE to paint picture he was working on with OT. Pt demonstrating good stability with RUE and improved grasp and coordination. Patient alert and agreeable to PT session.   Patient with no pain complaint throughout  session.  Therapeutic Activity: Transfers: Focus during session on pt's ability to mobilize in room using RW under distant supervision/ Mod I in order to build confidence prior to return home. Patient provided with RW to his side as it would be in home environment. He is able to bring walker to appropriate placement prior to initiating transfers. Performed sit<>stand and stand pivot transfers w/c <> RW  and RW <> loveseat in room with good technique and no cues required. Pt able to demonstrate good technique from lower surface with forward scoot prior to attempt to rise to stand. Educated in improved technique with push of UB forward instead of leaning over UE that is pushing from seat for power up. Pt understands difference in performance and performs improved technique x2 following education.   Gait Training:  Patient ambulated 30-35 ft x2 in room using RW under distant supervision/ Mod I in order for pt to demo ability to mobilize in home without wife nearby. Demonstrated adequate quality of gait for safe in home ambulation along with appropriate mgmt of Rw throughout.    Patient seated upright  in w/c at end of session with brakes locked, belt alarm set, and all needs within reach. New lunch tray with correct lunch available.    Alger Simons 08/09/2021, 8:23 PM

## 2021-08-10 NOTE — Plan of Care (Signed)
  Problem: RH Balance Goal: LTG Patient will maintain dynamic sitting balance (PT) Description: LTG:  Patient will maintain dynamic sitting balance with assistance during mobility activities (PT) Outcome: Completed/Met Flowsheets (Taken 07/27/2021 2020 by Henrene Pastor, Student-PT) LTG: Pt will maintain dynamic sitting balance during mobility activities with:: Independent with assistive device  Goal: LTG Patient will maintain dynamic standing balance (PT) Description: LTG:  Patient will maintain dynamic standing balance with assistance during mobility activities (PT) Outcome: Completed/Met Flowsheets (Taken 07/27/2021 2020 by Henrene Pastor, Student-PT) LTG: Pt will maintain dynamic standing balance during mobility activities with:: Supervision/Verbal cueing   Problem: Sit to Stand Goal: LTG:  Patient will perform sit to stand with assistance level (PT) Description: LTG:  Patient will perform sit to stand with assistance level (PT) Outcome: Completed/Met Flowsheets (Taken 07/27/2021 2020 by Henrene Pastor, Student-PT) LTG: PT will perform sit to stand in preparation for functional mobility with assistance level: Supervision/Verbal cueing   Problem: RH Bed Mobility Goal: LTG Patient will perform bed mobility with assist (PT) Description: LTG: Patient will perform bed mobility with assistance, with/without cues (PT). Outcome: Completed/Met Flowsheets (Taken 08/09/2021 1744) LTG: Pt will perform bed mobility with assistance level of: Set up assist    Problem: RH Bed to Chair Transfers Goal: LTG Patient will perform bed/chair transfers w/assist (PT) Description: LTG: Patient will perform bed to chair transfers with assistance (PT). Outcome: Completed/Met Flowsheets (Taken 07/27/2021 2020 by Henrene Pastor, Student-PT) LTG: Pt will perform Bed to Chair Transfers with assistance level: Supervision/Verbal cueing   Problem: RH Car Transfers Goal: LTG Patient will perform car transfers  with assist (PT) Description: LTG: Patient will perform car transfers with assistance (PT). Outcome: Completed/Met Flowsheets (Taken 07/27/2021 2020 by Henrene Pastor, Student-PT) LTG: Pt will perform car transfers with assist:: Supervision/Verbal cueing   Problem: RH Ambulation Goal: LTG Patient will ambulate in controlled environment (PT) Description: LTG: Patient will ambulate in a controlled environment, # of feet with assistance (PT). Outcome: Completed/Met Flowsheets (Taken 08/03/2021 0939 by Lorie Phenix, PT) LTG: Pt will ambulate in controlled environ  assist needed:: Supervision/Verbal cueing LTG: Ambulation distance in controlled environment: 128ft with LRAD Goal: LTG Patient will ambulate in home environment (PT) Description: LTG: Patient will ambulate in home environment, # of feet with assistance (PT). Outcome: Completed/Met Flowsheets (Taken 08/03/2021 0939 by Lorie Phenix, PT) LTG: Pt will ambulate in home environ  assist needed:: Supervision/Verbal cueing LTG: Ambulation distance in home environment: 21ft with LRAD   Problem: RH Stairs Goal: LTG Patient will ambulate up and down stairs w/assist (PT) Description: LTG: Patient will ambulate up and down # of stairs with assistance (PT) Outcome: Completed/Met Flowsheets (Taken 07/27/2021 2020 by Henrene Pastor, Student-PT) LTG: Pt will  ambulate up and down number of stairs: 4

## 2021-08-10 NOTE — Progress Notes (Signed)
ANTICOAGULATION CONSULT NOTE - Follow Up Consult  Pharmacy Consult for Warfarin Indication:  Apical thrombus  No Known Allergies  Patient Measurements: Height: 5\' 6"  (167.6 cm) Weight: 87.7 kg (193 lb 5.5 oz) IBW/kg (Calculated) : 63.8  Vital Signs: Temp: 98 F (36.7 C) (09/07 0511) Temp Source: Oral (09/07 0511) BP: 137/81 (09/07 0511) Pulse Rate: 68 (09/07 0511)  Labs: Recent Labs    08/08/21 0618  HGB 10.1*  HCT 31.2*  PLT 476*  LABPROT 22.8*  INR 2.0*  CREATININE 1.76*     Estimated Creatinine Clearance: 35.9 mL/min (A) (by C-G formula based on SCr of 1.76 mg/dL (H)).   Medications:  Scheduled:   atorvastatin  40 mg Oral Daily   docusate sodium  100 mg Oral BID   melatonin  1.5 mg Oral QHS   metoprolol succinate  50 mg Oral QHS   pantoprazole  40 mg Oral QHS   polyethylene glycol  17 g Oral Daily   tamsulosin  0.4 mg Oral Daily   tiZANidine  2 mg Oral QHS   warfarin  5 mg Oral q1600   Warfarin - Pharmacist Dosing Inpatient   Does not apply q1600    Assessment: 80 yo male presenting with apical thrombus, no AC PTA.  Pharmacy has been consulted for warfarin dosing.   Last INR 9/5 was therapeutic at 2.0 but trending down slowly.  CBC stable.  Scr continuing to trend down.  Eating well. No signs/symptoms of bleeding noted per RN. Discharging today.  Goal of Therapy:  INR 2-3 Monitor platelets by anticoagulation protocol: Yes   Plan:  For discharge, suggest Warfarin 6 mg PO daily. Suggest INR follow up by 9/12  11/12, PharmD, BCPS, [facility] Clinical Pharmacist  Please check AMION for all [facility] Pharm[facility] phone numbers After 10:00 PM, call Main Pharmacy [phone]

## 2021-08-10 NOTE — Progress Notes (Signed)
INPATIENT REHABILITATION DISCHARGE NOTE   Discharge instructions by: Delle Reining  Verbalized understanding: by patient and patient's daughter  Skin care/Wound care:  Pain: no pain  IV's: no IV's  Tubes/Drains: none  Safety instructions: reviewed   Patient belongings: sent home with daughter  Discharged to: home  Discharged via: private car  Notes:

## 2021-08-10 NOTE — Discharge Summary (Signed)
Physician Discharge Summary  Patient ID: Roberto Dickson MRN: [id number] DOB/AGE: Aug 14, 1941 80 y.o.  Admit date: 07/26/2021 Discharge date: 08/10/2021  Discharge Diagnoses:  Principal Problem:   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HCC) Active Problems:   Sleep disturbance   Acute blood loss anemia   Essential hypertension   AKI (acute kidney injury) [facility])   Discharged Condition: good  Significant Diagnostic Studies: N/A   Labs:  Basic Metabolic Panel: BMP Latest Ref Rng & Units 08/08/2021 08/04/2021 08/01/2021  Glucose 70 - 99 mg/dL 89 96 032(Z)  BUN 8 - 23 mg/dL 22 22(Q) 82(N)  Creatinine 0.61 - 1.24 mg/dL 0.03(B) 0.48(G) 8.91(Q)  Sodium 135 - 145 mmol/L 136 134(L) 134(L)  Potassium 3.5 - 5.1 mmol/L 4.2 4.2 4.5  Chloride 98 - 111 mmol/L 103 104 104  CO2 22 - 32 mmol/L 22 23 24   Calcium 8.9 - 10.3 mg/dL ) 8.3(L) 8.0(L)     CBC: CBC Latest Ref Rng & Units 08/08/2021 08/01/2021 07/31/2021  WBC 4.0 - 10.5 K/uL 7.4 11.0(H) 11.9(H)  Hemoglobin 13.0 - 17.0 g/dL 10.1(L) 10.0(L) 9.9(L)  Hematocrit 39.0 - 52.0 % 31.2(L) 30.2(L) 29.5(L)  Platelets 150 - 400 K/uL 476(H) 430(H) 386     CBG: No results for input(s): GLUCAP in the last 168 hours.  Brief HPI:   Roberto Dickson is a 80 y.o. male with history of hypertension who was admitted on 07/19/2021 with RUE weakness, left gaze deviation and inability to speak.  He received tPA and CTA head/neck showed occlusion of left ICA at origin with occlusion to carotid terminus.  He underwent cerebral angio with revascularization of left ICA from proximal to distal terminus and origin of left MCA by Dr. 07/21/2021.  MRI/MRA brain showed scattered acute infarcts in left cerebellum and both cerebral hemispheres largest in left posterior frontal region compatible with cardiac or aortic source.  2D echo done revealing EF of 50 to 55% with apical thrombus.   Postprocedure she was noted to have ecchymosis right groin due to  partially thrombosed pseudoaneurysm which was treated with thrombin.  Follow-up ultrasound showed pseudoaneurysm to have resolved with 2.3x3.0 right groin hematoma.  He was also noted to have leukocytosis with low-grade fevers as well as tachypnea and was started on IV rest Rocephin due to concerns of UTI.  Fevers resolved however he continued to have issues with posterior lean, right knee instability, right upper extremity weakness affecting ADLs and mobility.  CIR was recommended due to functional decline.   Hospital Course: Roberto Dickson was admitted to rehab 07/26/2021 for inpatient therapies to consist of PT and OT at least three hours five days a week. Past admission physiatrist, therapy team and rehab RN have worked together to provide customized collaborative inpatient rehab.  He was maintained on treatment dose Lovenox bridge to Coumadin until INR was therapeutic.  Pharmacy has assisted with management of Coumadin min assistant with dosing.  INR is therapeutic at discharge and patient was discharged on 6 mg a day with repeat levels to be checked on 09/09.  Patient and family have been educated on low vitamin K diet as well as importance of Coumadin monitoring after discharge.    Acute on chronic renal failure was treated with IV fluids x2 nights with serial labs showing overall improvement.  Hyponatremia has resolved.  LE spasms have resolved with addition of tizanidine. His respiratory status has improved and he was weaned off oxygen by 08/29.  Bowel program has been  augmented with good results. His blood pressures were monitored on TID basis and Toprol was titrated upward for better control. Voltaren gel was added to help with right knee pain. Sleep disturbance has improved with addition of trazodone to melatonin. He has made good gains and is modified independent to supervision level at discharge. He will continue to receive follow up Home Health by [facility] after discharge.     Rehab course: During patient's stay in rehab weekly team conferences were held to monitor patient's progress, set goals and discuss barriers to discharge. At admission, patient required max assist with ADL tasks and with mobility. He  has had improvement in activity tolerance, balance, postural control as well as ability to compensate for deficits. He has had improvement in functional use RUE  and RLE as well as improvement in awareness. He requires min assist with ADL tasks. He is independent for transfers and requires supervision to ambulate 250' with RW. Family education was completed.     Disposition: Home  Diet: Heart Healthy.   Special Instructions: Next protime to be drawn on 09/09 10:30 am at [facility] coumadin clinic.  2. Recommend repeat CBC to monitor H/H for improvement.   Discharge Instructions     Ambulatory referral to Neurology   Complete by: As directed    Follow up with stroke clinic NP (Jessica Vanschaick or Darrol Angel, if both not available, consider Manson Allan, or Ahern) at [facility] in about 4 weeks. Thanks.      Allergies as of 08/10/2021   No Known Allergies      Medication List     STOP taking these medications    cefTRIAXone 1 g in sodium chloride 0.9 % 100 mL   enoxaparin 100 MG/ML injection Commonly known as: LOVENOX       TAKE these medications    acetaminophen 325 MG tablet Commonly known as: TYLENOL Take 1-2 tablets (325-650 mg total) by mouth every 4 (four) hours as needed for mild pain.   atorvastatin 40 MG tablet Commonly known as: LIPITOR Take 1 tablet (40 mg total) by mouth daily.   docusate sodium 100 MG capsule Commonly known as: COLACE Take 1 capsule (100 mg total) by mouth 2 (two) times daily.   melatonin 3 MG Tabs tablet Take 0.5 tablets (1.5 mg total) by mouth at bedtime. OVER THE COUNTER   metoprolol succinate 50 MG 24 hr tablet Commonly known as: TOPROL-XL Take 1 tablet (50 mg total) by mouth at  bedtime. What changed:  medication strength how much to take   pantoprazole 40 MG tablet Commonly known as: PROTONIX Take 1 tablet (40 mg total) by mouth at bedtime.   polyethylene glycol 17 g packet Commonly known as: MIRALAX / GLYCOLAX Take 17 g by mouth daily.   tamsulosin 0.4 MG Caps capsule Commonly known as: FLOMAX Take 1 capsule (0.4 mg total) by mouth daily.   tiZANidine 2 MG tablet Commonly known as: ZANAFLEX Take 1 tablet (2 mg total) by mouth at bedtime.   traZODone 100 MG tablet Commonly known as: DESYREL Take 1 tablet (100 mg total) by mouth at bedtime as needed for sleep.   warfarin 6 MG tablet Commonly known as: COUMADIN Take as directed. If you are unsure how to take this medication, talk to your nurse or doctor. Original instructions: Take 1 tablet (6 mg total) by mouth daily with supper. What changed:  medication strength how much to take when to take this  Follow-up Information     Kirsteins, Victorino Sparrow, MD Follow up.   Specialty: Physical Medicine and Rehabilitation Contact information: [address] [place] [address] [phone]         Horton Chin, MD Follow up.   Specialty: Physical Medicine and Rehabilitation Contact information: [address] [place] [address] [phone]         Jodelle Red, MD Follow up.   Specialty: Cardiology Contact information: [address] [place] [place] [address] [phone]         [facility]. Call in 1 day(s).   Why: for stroke follow up Contact information: [address]     [address] [place] [address] [phone]                Signed: Jacquelynn Cree 08/16/2021, 12:23 AM

## 2021-08-12 ENCOUNTER — Other Ambulatory Visit: Payer: Self-pay

## 2021-08-12 ENCOUNTER — Ambulatory Visit (INDEPENDENT_AMBULATORY_CARE_PROVIDER_SITE_OTHER): Payer: Medicare Other | Admitting: *Deleted

## 2021-08-12 DIAGNOSIS — I513 Intracardiac thrombosis, not elsewhere classified: Secondary | ICD-10-CM

## 2021-08-12 DIAGNOSIS — Z5181 Encounter for therapeutic drug level monitoring: Secondary | ICD-10-CM | POA: Diagnosis not present

## 2021-08-12 DIAGNOSIS — I63512 Cerebral infarction due to unspecified occlusion or stenosis of left middle cerebral artery: Secondary | ICD-10-CM

## 2021-08-12 HISTORY — DX: Intracardiac thrombosis, not elsewhere classified: I51.3

## 2021-08-12 NOTE — Patient Instructions (Addendum)
  A full discussion of the nature of anticoagulants has been carried out.  A benefit risk analysis has been presented to the patient, so that they understand the justification for choosing anticoagulation at this time. The need for frequent and regular monitoring, precise dosage adjustment and compliance is stressed.  Side effects of potential bleeding are discussed.  The patient should avoid any OTC items containing aspirin or ibuprofen, and should avoid great swings in general diet.  Avoid alcohol consumption.  Call if any signs of abnormal bleeding.    Description   Continue to take warfarin 1 tablet daily. Recheck INR in 1 week. Coumadin Clinic [phone]

## 2021-08-15 ENCOUNTER — Telehealth: Payer: Self-pay | Admitting: Emergency Medicine

## 2021-08-15 NOTE — Telephone Encounter (Signed)
HH ORDERS   Caller Name: [facility] Agency Name: [facility] [employer] Phone #: [phone] Service Requested: Pt Frequency of Visits: 1 week 9  Okay to LVM if no answer

## 2021-08-16 NOTE — Telephone Encounter (Signed)
Not a patient of mine.  Do not give any verbal orders.  I do not know this person. ???

## 2021-08-17 ENCOUNTER — Ambulatory Visit (INDEPENDENT_AMBULATORY_CARE_PROVIDER_SITE_OTHER): Payer: Medicare Other

## 2021-08-17 ENCOUNTER — Other Ambulatory Visit: Payer: Self-pay

## 2021-08-17 ENCOUNTER — Telehealth: Payer: Self-pay | Admitting: Physical Medicine & Rehabilitation

## 2021-08-17 DIAGNOSIS — I63512 Cerebral infarction due to unspecified occlusion or stenosis of left middle cerebral artery: Secondary | ICD-10-CM

## 2021-08-17 DIAGNOSIS — I513 Intracardiac thrombosis, not elsewhere classified: Secondary | ICD-10-CM

## 2021-08-17 DIAGNOSIS — Z5181 Encounter for therapeutic drug level monitoring: Secondary | ICD-10-CM | POA: Diagnosis not present

## 2021-08-17 LAB — POCT INR: INR: 2 (ref 2.0–3.0)

## 2021-08-17 NOTE — Telephone Encounter (Signed)
Patient scheduled new patient appt through MyChart for 12.07.22  Advised [facility] PT that patient is not currently under provider care & order was not approved

## 2021-08-17 NOTE — Telephone Encounter (Signed)
Hospital notes reviewed per protocol: In-home Physical Therapy verbal approval given. Visit to be once a week for 9 weeks.

## 2021-08-17 NOTE — Telephone Encounter (Signed)
Gordan PT with [facility] would like verbal orders for 1W9.  Please call him at [phone].

## 2021-08-17 NOTE — Patient Instructions (Signed)
Continue to take warfarin 1 tablet daily. Recheck INR in 2 weeks. Coumadin Clinic [phone]

## 2021-08-25 ENCOUNTER — Other Ambulatory Visit: Payer: Self-pay

## 2021-08-25 ENCOUNTER — Encounter: Payer: Self-pay | Admitting: Physical Medicine & Rehabilitation

## 2021-08-25 ENCOUNTER — Encounter: Payer: Medicare Other | Attending: Physical Medicine & Rehabilitation | Admitting: Physical Medicine & Rehabilitation

## 2021-08-25 VITALS — BP 135/90 | HR 88 | Temp 98.3°F | Ht 66.0 in | Wt 185.0 lb

## 2021-08-25 DIAGNOSIS — I6930 Unspecified sequelae of cerebral infarction: Secondary | ICD-10-CM

## 2021-08-25 DIAGNOSIS — N179 Acute kidney failure, unspecified: Secondary | ICD-10-CM | POA: Diagnosis not present

## 2021-08-25 DIAGNOSIS — R269 Unspecified abnormalities of gait and mobility: Secondary | ICD-10-CM | POA: Diagnosis not present

## 2021-08-25 DIAGNOSIS — D62 Acute posthemorrhagic anemia: Secondary | ICD-10-CM

## 2021-08-25 MED ORDER — TIZANIDINE HCL 2 MG PO TABS: 2.0000 mg | ORAL_TABLET | Freq: Every day | ORAL | 0 refills | Status: DC

## 2021-08-25 NOTE — Progress Notes (Signed)
Subjective:    Patient ID: Roberto Dickson, male    DOB: 12-Sep-1941, 80 y.o.   MRN: [id number]  HPI 80 y.o. male with history of hypertension presents for transitional care management after receiving CIR for L MCA/L ICA stroke  Admit date: 07/26/2021 Discharge date: 08/10/2021  Daughter supplements history. At discharge he was instructed to follow up for INR, which has been checked, but CBC has not.  He is scheduled to sees Neuro and Cards. Pain in thigh has resolved. Muscle spasms are mild and he is taking muscle relaxer.  Sleep has improved and he is not taking Melatonin.  BP is controlled. He has an appointment with PCP.  Bowel movements are regular.  Right knee pain improved. Denies falls.  Therapies: 2/week Mobility: Walker at all times DME: Bedside commode, shower chair  Pain Inventory Average Pain 6 Pain Right Now 1 My pain is intermittent and tingling  LOCATION OF PAIN  Both Ankles & Feet  BOWEL Number of stools per week: 4 Oral laxative use Yes  Type of laxative Miralax when need & stool softner Enema or suppository use No  History of colostomy No  Incontinent No   BLADDER Normal  Mobility use a walker ability to climb steps?  no do you drive?  no Do you have any goals in this area?  yes  Function retired I need assistance with the following:  dressing, bathing, toileting, and meal prep Do you have any goals in this area?  yes  Neuro/Psych tremor tingling trouble walking  Prior Studies Any changes since last visit?  no  Physicians involved in your care Any changes since last visit?  no   Family History  Problem Relation Age of Onset   Cancer - Prostate Father    Social History   Socioeconomic History   Marital status: Married    Spouse name: Not on file   Number of children: Not on file   Years of education: Not on file   Highest education level: Not on file  Occupational History   Not on file  Tobacco Use   Smoking status: Never    Smokeless tobacco: Never  Vaping Use   Vaping Use: Never used  Substance and Sexual Activity   Alcohol use: Not Currently    Alcohol/week: 1.0 standard drink    Types: 1 Cans of beer per week   Drug use: Never   Sexual activity: Not on file  Other Topics Concern   Not on file  Social History Narrative   Not on file   Social Determinants of Health   Financial Resource Strain: Not on file  Food Insecurity: Not on file  Transportation Needs: Not on file  Physical Activity: Not on file  Stress: Not on file  Social Connections: Not on file   Past Surgical History:  Procedure Laterality Date   APPENDECTOMY     in his 73's   IR CT HEAD LTD  07/19/2021   IR FLUORO GUIDED NEEDLE PLC ASPIRATION/INJECTION LOC  07/21/2021   IR PERCUTANEOUS ART THROMBECTOMY/INFUSION INTRACRANIAL INC DIAG ANGIO  07/19/2021   RADIOLOGY WITH ANESTHESIA N/A 07/19/2021   Procedure: IR WITH ANESTHESIA;  Surgeon: Julieanne Cotton, MD;  Location: [facility] OR;  Service: Radiology;  Laterality: N/A;   Past Medical History:  Diagnosis Date   Hypertension    BP 135/90   Pulse 88   Temp 98.3 F (36.8 C)   Ht 5\' 6"  (1.676 m)   Wt 83.9 kg  SpO2 91%   BMI 29.86 kg/m   Opioid Risk Score:   Fall Risk Score:  `1  Depression screen PHQ 2/9  Depression screen PHQ 2/9 08/25/2021  Decreased Interest 0  Down, Depressed, Hopeless 0  PHQ - 2 Score 0  Altered sleeping 0  Tired, decreased energy 0  Change in appetite 0  Feeling bad or failure about yourself  1  Trouble concentrating 1  Moving slowly or fidgety/restless 0  Suicidal thoughts 0  PHQ-9 Score 2    Review of Systems  Constitutional:  Positive for appetite change.       Eating less  Cardiovascular:        Swelling in feet, ankles, right hand  Musculoskeletal:  Positive for gait problem.  Neurological:  Positive for tremors. Negative for weakness.       Tingling  All other systems reviewed and are negative.     Objective:   Physical  Exam Constitutional: No distress . Vital signs reviewed. HENT: Normocephalic.  Atraumatic. Eyes: EOMI. No discharge. Cardiovascular: No JVD.  [name] Respiratory: Normal effort.  No stridor.   GI: Non-distended.   Skin: Warm and dry.  Intact. Psych: Normal mood.  Normal behavior. Musc: LE edema.  No tenderness in extremities. Slow cadence Kyphotic posture Neuro: Alert Motor: RUE 4-/5 prox to distal  RLE HF 4-/5, 4/5 KE and 4/5 ADF/PF.  LUE/LLE 5/5 grossly.     Assessment & Plan:  80 y.o. male with history of hypertension presents for transitional care management after receiving CIR for L MCA/L ICA stroke  1. 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  LV thrombusAntithrombotics:  Continue Coumadin Follow up with Cards  3. . Spasms:             Continue tizanidine 2mg  qhs for spasms  4. HTN:  Continue metoprolol  5. Acute on chronic renal failure?:    Will order repeat BMP  6. ABLA/Right groin hematoma/pseudoaneurysm: Repeat CBC ordered  7. Gait abnormality  Cont walker for safety  Cont therapies  Meds reviewed Referrals reviewed All questions answered

## 2021-08-26 LAB — CBC WITH DIFFERENTIAL/PLATELET
Basophils Absolute: 0.1 10*3/uL (ref 0.0–0.2)
Basos: 2 %
EOS (ABSOLUTE): 0.3 10*3/uL (ref 0.0–0.4)
Eos: 5 %
Hematocrit: 38.2 % (ref 37.5–51.0)
Hemoglobin: 12.3 g/dL — ABNORMAL LOW (ref 13.0–17.7)
Immature Grans (Abs): 0 10*3/uL (ref 0.0–0.1)
Immature Granulocytes: 0 %
Lymphocytes Absolute: 1.2 10*3/uL (ref 0.7–3.1)
Lymphs: 19 %
MCH: 29.6 pg (ref 26.6–33.0)
MCHC: 32.2 g/dL (ref 31.5–35.7)
MCV: 92 fL (ref 79–97)
Monocytes Absolute: 1.1 10*3/uL — ABNORMAL HIGH (ref 0.1–0.9)
Monocytes: 17 %
Neutrophils Absolute: 3.9 10*3/uL (ref 1.4–7.0)
Neutrophils: 57 %
Platelets: 286 10*3/uL (ref 150–450)
RBC: 4.15 x10E6/uL (ref 4.14–5.80)
RDW: 13.9 % (ref 11.6–15.4)
WBC: 6.7 10*3/uL (ref 3.4–10.8)

## 2021-08-26 LAB — BASIC METABOLIC PANEL
BUN/Creatinine Ratio: 16 (ref 10–24)
BUN: 27 mg/dL (ref 8–27)
CO2: 20 mmol/L (ref 20–29)
Calcium: 9 mg/dL (ref 8.6–10.2)
Chloride: 105 mmol/L (ref 96–106)
Creatinine, Ser: 1.69 mg/dL — ABNORMAL HIGH (ref 0.76–1.27)
Glucose: 97 mg/dL (ref 65–99)
Potassium: 4.2 mmol/L (ref 3.5–5.2)
Sodium: 140 mmol/L (ref 134–144)
eGFR: 41 mL/min/{1.73_m2} — ABNORMAL LOW (ref >59)

## 2021-08-26 NOTE — Progress Notes (Signed)
Subjective:    Patient ID: Roberto Dickson, male    DOB: September 22, 1941, 80 y.o.   MRN: [id number]  HPI 80 y.o. male with history of hypertension presents for transitional care management after receiving CIR for L MCA/L ICA stroke  Admit date: 07/26/2021 Discharge date: 08/10/2021  Daughter supplements history. At discharge he was instructed to follow up for INR, which has been checked, but CBC has not.  He is scheduled to sees Neuro and Cards. Pain in thigh has resolved. Muscle spasms are mild and he is taking muscle relaxer.  Sleep has improved and he is not taking Melatonin.  BP is controlled. He has an appointment with PCP.  Bowel movements are regular.  Right knee pain improved. Denies falls.  Therapies: 2/week Mobility: Walker at all times DME: Bedside commode, shower chair  Pain Inventory Average Pain 6 Pain Right Now 1 My pain is intermittent and tingling  LOCATION OF PAIN  Both Ankles & Feet  BOWEL Number of stools per week: 4 Oral laxative use Yes  Type of laxative Miralax when need & stool softner Enema or suppository use No  History of colostomy No  Incontinent No   BLADDER Normal  Mobility use a walker ability to climb steps?  no do you drive?  no Do you have any goals in this area?  yes  Function retired I need assistance with the following:  dressing, bathing, toileting, and meal prep Do you have any goals in this area?  yes  Neuro/Psych tremor tingling trouble walking  Prior Studies Any changes since last visit?  no  Physicians involved in your care Any changes since last visit?  no   Family History  Problem Relation Age of Onset   Cancer - Prostate Father    Social History   Socioeconomic History   Marital status: Married    Spouse name: Not on file   Number of children: Not on file   Years of education: Not on file   Highest education level: Not on file  Occupational History   Not on file  Tobacco Use   Smoking status: Never    Smokeless tobacco: Never  Vaping Use   Vaping Use: Never used  Substance and Sexual Activity   Alcohol use: Not Currently    Alcohol/week: 1.0 standard drink    Types: 1 Cans of beer per week   Drug use: Never   Sexual activity: Not on file  Other Topics Concern   Not on file  Social History Narrative   Not on file   Social Determinants of Health   Financial Resource Strain: Not on file  Food Insecurity: Not on file  Transportation Needs: Not on file  Physical Activity: Not on file  Stress: Not on file  Social Connections: Not on file   Past Surgical History:  Procedure Laterality Date   APPENDECTOMY     in his 90's   IR CT HEAD LTD  07/19/2021   IR FLUORO GUIDED NEEDLE PLC ASPIRATION/INJECTION LOC  07/21/2021   IR PERCUTANEOUS ART THROMBECTOMY/INFUSION INTRACRANIAL INC DIAG ANGIO  07/19/2021   RADIOLOGY WITH ANESTHESIA N/A 07/19/2021   Procedure: IR WITH ANESTHESIA;  Surgeon: Julieanne Cotton, MD;  Location: [facility] OR;  Service: Radiology;  Laterality: N/A;   Past Medical History:  Diagnosis Date   Hypertension    BP 135/90   Pulse 88   Temp 98.3 F (36.8 C)   Ht 5\' 6"  (1.676 m)   Wt 83.9 kg  SpO2 91%   BMI 29.86 kg/m   Opioid Risk Score:   Fall Risk Score:  `1  Depression screen PHQ 2/9  Depression screen PHQ 2/9 08/25/2021  Decreased Interest 0  Down, Depressed, Hopeless 0  PHQ - 2 Score 0  Altered sleeping 0  Tired, decreased energy 0  Change in appetite 0  Feeling bad or failure about yourself  1  Trouble concentrating 1  Moving slowly or fidgety/restless 0  Suicidal thoughts 0  PHQ-9 Score 2    Review of Systems  Constitutional:  Positive for appetite change.       Eating less  Cardiovascular:        Swelling in feet, ankles, right hand  Musculoskeletal:  Positive for gait problem.  Neurological:  Positive for tremors. Negative for weakness.       Tingling  All other systems reviewed and are negative.     Objective:   Physical  Exam Constitutional: No distress . Vital signs reviewed. HENT: Normocephalic.  Atraumatic. Eyes: EOMI. No discharge. Cardiovascular: No JVD.  [name] Respiratory: Normal effort.  No stridor.   GI: Non-distended.   Skin: Warm and dry.  Intact. Psych: Normal mood.  Normal behavior. Musc: LE edema.  No tenderness in extremities. Slow cadence Kyphotic posture Neuro: Alert Motor: RUE 4-/5 prox to distal  RLE HF 4-/5, 4/5 KE and 4/5 ADF/PF.  LUE/LLE 5/5 grossly.     Assessment & Plan:  80 y.o. male with history of hypertension presents for transitional care management after receiving CIR for L MCA/L ICA stroke  1. 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  LV thrombusAntithrombotics:  Continue Coumadin Follow up with Cards  3. . Spasms:             Continue tizanidine 2mg  qhs for spasms  4. HTN:  Continue metoprolol  5. Acute on chronic renal failure?:    Will order repeat BMP  6. ABLA/Right groin hematoma/pseudoaneurysm: Repeat CBC ordered  7. Gait abnormality  Cont walker for safety  Cont therapies  Meds reviewed Referrals reviewed All questions answered

## 2021-08-31 ENCOUNTER — Ambulatory Visit (INDEPENDENT_AMBULATORY_CARE_PROVIDER_SITE_OTHER): Payer: Medicare Other | Admitting: Family

## 2021-08-31 ENCOUNTER — Encounter (HOSPITAL_BASED_OUTPATIENT_CLINIC_OR_DEPARTMENT_OTHER): Payer: Self-pay | Admitting: Family

## 2021-08-31 ENCOUNTER — Ambulatory Visit (INDEPENDENT_AMBULATORY_CARE_PROVIDER_SITE_OTHER): Payer: Medicare Other

## 2021-08-31 ENCOUNTER — Other Ambulatory Visit: Payer: Self-pay

## 2021-08-31 ENCOUNTER — Encounter (HOSPITAL_BASED_OUTPATIENT_CLINIC_OR_DEPARTMENT_OTHER): Payer: Self-pay

## 2021-08-31 VITALS — BP 120/60 | HR 64 | Ht 66.0 in | Wt 185.0 lb

## 2021-08-31 DIAGNOSIS — R072 Precordial pain: Secondary | ICD-10-CM

## 2021-08-31 DIAGNOSIS — I63512 Cerebral infarction due to unspecified occlusion or stenosis of left middle cerebral artery: Secondary | ICD-10-CM | POA: Diagnosis not present

## 2021-08-31 DIAGNOSIS — R931 Abnormal findings on diagnostic imaging of heart and coronary circulation: Secondary | ICD-10-CM

## 2021-08-31 DIAGNOSIS — E785 Hyperlipidemia, unspecified: Secondary | ICD-10-CM

## 2021-08-31 DIAGNOSIS — I10 Essential (primary) hypertension: Secondary | ICD-10-CM

## 2021-08-31 DIAGNOSIS — I513 Intracardiac thrombosis, not elsewhere classified: Secondary | ICD-10-CM

## 2021-08-31 DIAGNOSIS — Z5181 Encounter for therapeutic drug level monitoring: Secondary | ICD-10-CM

## 2021-08-31 DIAGNOSIS — Z8673 Personal history of transient ischemic attack (TIA), and cerebral infarction without residual deficits: Secondary | ICD-10-CM

## 2021-08-31 LAB — POCT INR: INR: 2.8 (ref 2.0–3.0)

## 2021-08-31 MED ORDER — ATORVASTATIN CALCIUM 40 MG PO TABS: 40.0000 mg | ORAL_TABLET | Freq: Every day | ORAL | 3 refills | Status: DC

## 2021-08-31 MED ORDER — METOPROLOL SUCCINATE ER 50 MG PO TB24: 50.0000 mg | ORAL_TABLET | Freq: Every day | ORAL | 3 refills | Status: DC

## 2021-08-31 NOTE — Patient Instructions (Addendum)
Medication Instructions:  Continue current medications.    *If you need a refill on your cardiac medications before your next appointment, please call your pharmacy*   Lab Work: Your physician recommends that you return for lab work in about 6 weeks for fasting lipid panel. Roberto Sorrow, NP will coordinate with your next Coumadin Clinic appointment and we will reach out to you regarding the date. You will need to be fasting for these labs.   If you have labs (blood work) drawn today and your tests are completely normal, you will receive your results only by: MyChart Message (if you have MyChart) OR A paper copy in the mail If you have any lab test that is abnormal or we need to change your treatment, we will call you to review the results.   Testing/Procedures: Your physician has requested that you have an echocardiogram in 3 months. Echocardiography is a painless test that uses sound waves to create images of your heart. It provides your doctor with information about the size and shape of your heart and how well your heart's chambers and valves are working. This procedure takes approximately one hour. There are no restrictions for this procedure.   Your physician has requested that you have a lexiscan myoview (soon). For further information please visit [email or web address] Please follow instruction sheet, as given.    Follow-Up: At [facility], you and your health needs are our priority.  As part of our continuing mission to provide you with exceptional heart care, we have created designated Provider Care Teams.  These Care Teams include your primary Cardiologist (physician) and Advanced Practice Providers (APPs -  Physician Assistants and Nurse Practitioners) who all work together to provide you with the care you need, when you need it.  We recommend signing up for the patient portal called "MyChart".  Sign up information is provided on this After Visit Summary.  MyChart is used  to connect with patients for Virtual Visits (Telemedicine).  Patients are able to view lab/test results, encounter notes, upcoming appointments, etc.  Non-urgent messages can be sent to your provider as well.   To learn more about what you can do with MyChart, go to [email or web address].    Your next appointment:   3 month(s) after echo  The format for your next appointment:   In Person  Provider:   Alver Sorrow, NP will route a note to request to transition care to a cardiologist at our [facility] office. We will reach out to you for an appointment.   Other Instructions   Roberto Shields, NP has ordered a Lexiscan Myocardial Perfusion Imaging Study [facility] Myoview).  Please arrive 15 minutes prior to your appointment time for registration and insurance purposes.   The test will take approximately 3 to 4 hours to complete; you may bring reading material.  If someone comes with you to your appointment, they will need to remain in the main lobby due to limited space in the testing area. **If you are pregnant or breastfeeding, please notify the nuclear lab prior to your appointment**   How to prepare for your Myocardial Perfusion Test: Do not eat or drink 3 hours prior to your test, except you may have water. Do not consume products containing caffeine (regular or decaffeinated) 12 hours prior to your test. (ex: coffee, chocolate, sodas, tea). Do wear comfortable clothes (no dresses or overalls) and walking shoes, tennis shoes preferred (No heels or open toe shoes are allowed). Do NOT wear cologne, perfume,  aftershave, or lotions (deodorant is allowed). If you use an inhaler, use it the AM of your test and bring it with you.  If you use a nebulizer, use it the AM of your test.  If these instructions are not followed, your test will have to be rescheduled.

## 2021-08-31 NOTE — Progress Notes (Signed)
Office Visit    Patient Name: Roberto Dickson Date of Encounter: 08/31/2021  PCP:  Horald Pollen, [place]  Cardiologist:  Buford Dresser, MD  Advanced Practice Provider:  No care team member to display Electrophysiologist:  None      Chief Complaint    Roberto Dickson is a 80 y.o. male with a hx of CVA, HTN, HLD, LV thrombus presents today for hospital follow-up  Past Medical History    Past Medical History:  Diagnosis Date   Hypertension    Past Surgical History:  Procedure Laterality Date   APPENDECTOMY     in his 74's   IR CT HEAD LTD  07/19/2021   IR FLUORO GUIDED NEEDLE PLC ASPIRATION/INJECTION LOC  07/21/2021   IR PERCUTANEOUS ART THROMBECTOMY/INFUSION INTRACRANIAL INC DIAG ANGIO  07/19/2021   RADIOLOGY WITH ANESTHESIA N/A 07/19/2021   Procedure: IR WITH ANESTHESIA;  Surgeon: Luanne Bras, MD;  Location: [place];  Service: Radiology;  Laterality: N/A;    Allergies  No Known Allergies  History of Present Illness    Roberto Dickson is a 80 y.o. male with a hx of CVA, HTN, HLD, LV thrombus last seen hospitalized.  He was admitted 07/26/2021 - 08/10/2021 with acute ischemic left middle cerebral artery (MCA) stroke.  He received tPA and additionally underwent cerebral angiography with revascularization of left ICA from proximal to distal terminus and origin of left MCA.  Echocardiogram with LVEF 50 to 55% with apical thrombus and Coumadin started.  There were wall motion abnormalities noted by echocardiogram but this was recommended for outpatient duration.[name]  He was recommended for CIR.  Presents today for follow-up with his daughter.  Visit assisted by interpreter.  Notes he is continue to work with home health physical therapy and is gaining strength in his right hand and right leg.  Not yet back to baseline and still has quite a bit of work to do to get to that point. Reports no shortness of breath nor dyspnea  on exertion. Reports no chest pain, pressure, or tightness. No edema, orthopnea, PND. Reports no palpitations.  We reviewed hospital records in detail.  EKGs/Labs/Other Studies Reviewed:   The following studies were reviewed today:  Echo 07/20/21  1. 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 Apical, apical anterior,  and apical septal akinesis. Inferoseptal hypokinesis. Left ventricular  ejection fraction, by estimation, is 50 to 55%. The left ventricle has low  normal function. The left  ventricle demonstrates regional wall motion abnormalities (see scoring  diagram/findings for description). Left ventricular diastolic parameters  are consistent with Grade I diastolic dysfunction (impaired relaxation).   2. Right ventricular systolic function is normal. The right ventricular  size is normal.   3. The mitral valve is normal in structure. Trivial mitral valve  regurgitation. No evidence of mitral stenosis.   4. The aortic valve is normal in structure. Aortic valve regurgitation is  not visualized. No aortic stenosis is present.   5. The inferior vena cava is normal in size with greater than 50%  respiratory variability, suggesting right atrial pressure of 3 mmHg.   EKG: No EKG today  Recent Labs: 07/21/2021: B Natriuretic Peptide 82.3 07/27/2021: ALT 29 08/25/2021: BUN 27; Creatinine, Ser 1.69; Hemoglobin 12.3; Platelets 286; Potassium 4.2; Sodium 140  Recent Lipid Panel    Component Value Date/Time   CHOL 179 07/20/2021 0536   TRIG 89 07/21/2021 0432   HDL 30 (L) 07/20/2021  0536   CHOLHDL 6.0 07/20/2021 0536   VLDL 17 07/20/2021 0536   LDLCALC 132 (H) 07/20/2021 0536   Home Medications   Current Meds  Medication Sig   acetaminophen (TYLENOL) 325 MG tablet Take 1-2 tablets (325-650 mg total) by mouth every 4 (four) hours as needed for mild pain.   docusate sodium (COLACE) 100 MG capsule Take 1 capsule (100 mg total) by mouth 2 (two) times daily.   melatonin 3 MG TABS tablet  Take 0.5 tablets (1.5 mg total) by mouth at bedtime. OVER THE COUNTER   pantoprazole (PROTONIX) 40 MG tablet Take 1 tablet (40 mg total) by mouth at bedtime.   polyethylene glycol (MIRALAX / GLYCOLAX) 17 g packet Take 17 g by mouth daily.   tamsulosin (FLOMAX) 0.4 MG CAPS capsule Take 1 capsule (0.4 mg total) by mouth daily.   tiZANidine (ZANAFLEX) 2 MG tablet Take 1 tablet (2 mg total) by mouth at bedtime.   traZODone (DESYREL) 100 MG tablet Take 1 tablet (100 mg total) by mouth at bedtime as needed for sleep.   warfarin (COUMADIN) 6 MG tablet Take 1 tablet (6 mg total) by mouth daily with supper.   [DISCONTINUED] atorvastatin (LIPITOR) 40 MG tablet Take 1 tablet (40 mg total) by mouth daily.   [DISCONTINUED] metoprolol succinate (TOPROL-XL) 50 MG 24 hr tablet Take 1 tablet (50 mg total) by mouth at bedtime.     Review of Systems    All other systems reviewed and are otherwise negative except as noted above.  Physical Exam    VS:  BP 120/60 (BP Location: Left Arm, Patient Position: Sitting, Cuff Size: Normal)   Pulse 64   Ht _0  (1.676 m)   Wt 185 lb (83.9 kg)   SpO2 96%   BMI 29.86 kg/m  , BMI Body mass index is 29.86 kg/m.  Wt Readings from Last 3 Encounters:  08/31/21 185 lb (83.9 kg)  08/25/21 185 lb (83.9 kg)  08/02/21 193 lb 5.5 oz (87.7 kg)    GEN: Well nourished, well developed, in no acute distress. HEENT: normal. Neck: Supple, no JVD, carotid bruits, or masses. Cardiac: RRR, no murmurs, rubs, or gallops. No clubbing, cyanosis, edema.  Radials/PT 2+ and equal bilaterally.  Respiratory:  Respirations regular and unlabored, clear to auscultation bilaterally. GI: Soft, nontender, nondistended. MS: No deformity or atrophy. Skin: Warm and dry, no rash. Neuro:  Strength and sensation are intact. Psych: Normal affect.  Assessment & Plan    History of CVA-continue to follow with neurology.  Continue atorvastatin.  No aspirin due to chronic anticoagulation.  LV  thrombus/anticoagulation-tolerating Coumadin without bleeding complications.  We will plan for repeat echocardiogram in 3 months to reassess LV thrombus.  Educated to report hematuria, melena.  Repeat CBC at time of next Coumadin clinic appointment for monitoring.  HTN- BP well controlled. Continue current antihypertensive regimen.    HLD, LDL goal less than 70-07/10/2021 cholesterol 179, HDL 30, LDL 32, triglycerides 84.  Started on statin during his admission.  C-Met, lipid panel on 10/04/2021.  Motion abnormalities-wall motion abnormalities noted by recent echocardiogram.  Given risk factors of hypertension, hyperlipidemia, obesity will plan for ischemic evaluation with Lexiscan Myoview.  Shared Decision Making/Informed Consent{  The risks [chest pain, shortness of breath, cardiac arrhythmias, dizziness, blood pressure fluctuations, myocardial infarction, stroke/transient ischemic attack, nausea, vomiting, allergic reaction, radiation exposure, metallic taste sensation and life-threatening complications (estimated to be 1 in 10,000)], benefits (risk stratification, diagnosing coronary artery disease, treatment guidance) and  alternatives of a nuclear stress test were discussed in detail with Mr. Delph and he agrees to proceed.   Disposition: Follow up in 3 month(s) with Dr. Harrell Gave or APP.  Signed, Loel Dubonnet, NP 08/31/2021, 5:00 PM [place]

## 2021-08-31 NOTE — Patient Instructions (Signed)
Continue to take warfarin 1 tablet daily. Recheck INR in 5 weeks. [facility] [phone]

## 2021-09-05 ENCOUNTER — Telehealth (HOSPITAL_COMMUNITY): Payer: Self-pay

## 2021-09-05 ENCOUNTER — Other Ambulatory Visit: Payer: Self-pay | Admitting: Physical Medicine and Rehabilitation

## 2021-09-05 ENCOUNTER — Other Ambulatory Visit: Payer: Self-pay | Admitting: Cardiology

## 2021-09-05 ENCOUNTER — Other Ambulatory Visit: Payer: Self-pay

## 2021-09-05 ENCOUNTER — Telehealth: Payer: Self-pay | Admitting: Cardiology

## 2021-09-05 MED ORDER — WARFARIN SODIUM 6 MG PO TABS: 6.0000 mg | ORAL_TABLET | Freq: Every day | ORAL | 0 refills | Status: DC

## 2021-09-05 MED ORDER — WARFARIN SODIUM 6 MG PO TABS: ORAL_TABLET | ORAL | 0 refills | Status: DC

## 2021-09-05 NOTE — Telephone Encounter (Signed)
Spoke with the patient, detailed instructions given to the patient's daughter per DPR. Asked to call back with any questions. She stated that she would have him here for his test. S.Navraj Dreibelbis EMTP

## 2021-09-05 NOTE — Telephone Encounter (Signed)
*  STAT* If patient is at the pharmacy, call can be transferred to refill team.   1. Which medications need to be refilled? (please list name of each medication and dose if known) warfarin (COUMADIN) 6 MG tablet  2. Which pharmacy/location (including street and city if local pharmacy) is medication to be sent to? [id number] - [place], [place] - [address]  3. Do they need a 30 day or 90 day supply? 90

## 2021-09-05 NOTE — Telephone Encounter (Signed)
Please review for refill. Thank you! 

## 2021-09-06 ENCOUNTER — Ambulatory Visit (HOSPITAL_COMMUNITY): Payer: Medicare Other | Attending: Family

## 2021-09-06 ENCOUNTER — Other Ambulatory Visit (HOSPITAL_COMMUNITY): Payer: Self-pay | Admitting: Family

## 2021-09-06 ENCOUNTER — Other Ambulatory Visit: Payer: Self-pay | Admitting: Physical Medicine & Rehabilitation

## 2021-09-06 ENCOUNTER — Other Ambulatory Visit: Payer: Self-pay | Admitting: Physical Medicine and Rehabilitation

## 2021-09-06 ENCOUNTER — Other Ambulatory Visit: Payer: Self-pay

## 2021-09-06 DIAGNOSIS — R072 Precordial pain: Secondary | ICD-10-CM | POA: Insufficient documentation

## 2021-09-06 DIAGNOSIS — R931 Abnormal findings on diagnostic imaging of heart and coronary circulation: Secondary | ICD-10-CM | POA: Diagnosis present

## 2021-09-06 DIAGNOSIS — E785 Hyperlipidemia, unspecified: Secondary | ICD-10-CM

## 2021-09-06 LAB — MYOCARDIAL PERFUSION IMAGING
LV dias vol: 136 mL (ref 62–150)
LV sys vol: 96 mL
Nuc Stress EF: 29 %
Peak HR: 93 {beats}/min
Rest HR: 85 {beats}/min
Rest Nuclear Isotope Dose: 10.4 mCi
SDS: 5
SRS: 21
SSS: 26
ST Depression (mm): 0 mm
Stress Nuclear Isotope Dose: 31.7 mCi
TID: 1

## 2021-09-06 MED ORDER — REGADENOSON 0.4 MG/5ML IV SOLN
0.4000 mg | Freq: Once | INTRAVENOUS | Status: AC
  Administered 2021-09-06: 0.4 mg via INTRAVENOUS

## 2021-09-06 MED ORDER — TECHNETIUM TC 99M TETROFOSMIN IV KIT
10.4000 | PACK | Freq: Once | INTRAVENOUS | Status: AC | PRN
  Administered 2021-09-06: 10.4 via INTRAVENOUS
  Filled 2021-09-06: qty 11

## 2021-09-06 MED ORDER — TECHNETIUM TC 99M TETROFOSMIN IV KIT
31.7000 | PACK | Freq: Once | INTRAVENOUS | Status: AC | PRN
  Administered 2021-09-06: 31.7 via INTRAVENOUS
  Filled 2021-09-06: qty 32

## 2021-09-07 ENCOUNTER — Other Ambulatory Visit: Payer: Self-pay | Admitting: Cardiology

## 2021-09-07 NOTE — Progress Notes (Signed)
Office Visit    Patient Name: Roberto Dickson Date of Encounter: 09/07/2021  PCP:  Horald Pollen, [place]  Cardiologist:  Buford Dresser, MD  Advanced Practice Provider:  No care team member to display Electrophysiologist:  None      Chief Complaint    Roberto Dickson is a 80 y.o. male with a hx of CVA, HTN, HLD, LV thrombus presents today for follow-up after abnormal stress test.  Past Medical History    Past Medical History:  Diagnosis Date   Hypertension    Past Surgical History:  Procedure Laterality Date   APPENDECTOMY     in his 73's   IR CT HEAD LTD  07/19/2021   IR FLUORO GUIDED NEEDLE PLC ASPIRATION/INJECTION LOC  07/21/2021   IR PERCUTANEOUS ART THROMBECTOMY/INFUSION INTRACRANIAL INC DIAG ANGIO  07/19/2021   RADIOLOGY WITH ANESTHESIA N/A 07/19/2021   Procedure: IR WITH ANESTHESIA;  Surgeon: Luanne Bras, MD;  Location: [place];  Service: Radiology;  Laterality: N/A;    Allergies  No Known Allergies  History of Present Illness    Roberto Dickson is a 80 y.o. male with a hx of CVA, HTN, HLD, LV thrombus last 08/31/21.  He was admitted 07/26/2021 - 08/10/2021 with acute ischemic left middle cerebral artery (MCA) stroke.  He received tPA and additionally underwent cerebral angiography with revascularization of left ICA from proximal to distal terminus and origin of left MCA.  Echocardiogram with LVEF 50 to 55% with apical thrombus and Coumadin started.  There were wall motion abnormalities noted by echocardiogram but this was recommended for outpatient duration.[name]  He was recommended for CIR.  Seen in follow up 08/31/21 with his daughter. He was working with [facility] PT with improvement in strength in right hand and leg but still fair ways to go to get back to baseline. He reported no chest pain nor dyspnea. Stress test was recommended due to wall motion abnormalities by echo. Stress test 09/06/21 with large size,  severe severity mid to apical inferior mostly fixed perfusion defect suggestive of large RCA territory scar with peri-infarct ischemia. LVEF read as 29% but suspected to be falsely low.  Presents today for follow-up with his daughter. Reports no shortness of breath nor dyspnea on exertion. Reports no chest pain, pressure, or tightness. No  orthopnea, PND. Trace pedal edema which is overall improving since last seen. Reports no palpitations.  Continues to work his [facility] PT. We discussed that reduced LVEF on recent stress test likely false reading, no signs of new onset heart failure. Discussed region of scarring and plan for further ischemic evaluation.   EKGs/Labs/Other Studies Reviewed:   The following studies were reviewed today:  Myoview 09/07/21   Findings are consistent with prior myocardial infarction with peri-infarct ischemia. The study is high risk.   No ST deviation was noted.   LV perfusion is abnormal. There is no evidence of ischemia. There is evidence of infarction. Defect 1: There is a large defect with severe reduction in uptake present in the apical to mid inferior location(s) that is partially reversible. Viability is present. There is abnormal wall motion in the defect area. Consistent with infarction and peri-infarct ischemia.   Left ventricular function is abnormal. Global function is severely reduced. There was a single regional abnormality. Nuclear stress EF: 29 %. The left ventricular ejection fraction is severely decreased (<30%). End diastolic cavity size is mildly enlarged. End systolic cavity size is mildly enlarged.  Prior study not available for comparison.   Large size, severe severity mid to apical inferior mostly fixed perfusion defect, suggestive of large RCA territory scar with peri-infarct ischemia (SDS 5). LVEF 29% with inferior and apical akinesis (suspect LVEF is falsely low, recommend echo correlation). This is a high risk study. No prior for comparison.  Echo  07/20/21  1. 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 Apical, apical anterior,  and apical septal akinesis. Inferoseptal hypokinesis. Left ventricular  ejection fraction, by estimation, is 50 to 55%. The left ventricle has low  normal function. The left  ventricle demonstrates regional wall motion abnormalities (see scoring  diagram/findings for description). Left ventricular diastolic parameters  are consistent with Grade I diastolic dysfunction (impaired relaxation).   2. Right ventricular systolic function is normal. The right ventricular  size is normal.   3. The mitral valve is normal in structure. Trivial mitral valve  regurgitation. No evidence of mitral stenosis.   4. The aortic valve is normal in structure. Aortic valve regurgitation is  not visualized. No aortic stenosis is present.   5. The inferior vena cava is normal in size with greater than 50%  respiratory variability, suggesting right atrial pressure of 3 mmHg.   EKG:EKG ordered today. The EKG performed today demonstrates  NSR 89 bpm with overall flat T-waves in lateral leads. No acute St/T wave changes.   Recent Labs: 07/21/2021: B Natriuretic Peptide 82.3 07/27/2021: ALT 29 08/25/2021: BUN 27; Creatinine, Ser 1.69; Hemoglobin 12.3; Platelets 286; Potassium 4.2; Sodium 140  Recent Lipid Panel    Component Value Date/Time   CHOL 179 07/20/2021 0536   TRIG 89 07/21/2021 0432   HDL 30 (L) 07/20/2021 0536   CHOLHDL 6.0 07/20/2021 0536   VLDL 17 07/20/2021 0536   LDLCALC 132 (H) 07/20/2021 0536   Home Medications   No outpatient medications have been marked as taking for the 09/08/21 encounter (Appointment) with Loel Dubonnet, NP.     Review of Systems    All other systems reviewed and are otherwise negative except as noted above.  Physical Exam    VS:  There were no vitals taken for this visit. , BMI There is no height or weight on file to calculate BMI.  Wt Readings from Last 3 Encounters:  09/06/21  185 lb (83.9 kg)  08/31/21 185 lb (83.9 kg)  08/25/21 185 lb (83.9 kg)    GEN: Well nourished, well developed, in no acute distress. HEENT: normal. Neck: Supple, no JVD, carotid bruits, or masses. Cardiac: RRR, no murmurs, rubs, or gallops. No clubbing, cyanosis, edema.  Radials/PT 2+ and equal bilaterally.  Respiratory:  Respirations regular and unlabored, clear to auscultation bilaterally. GI: Soft, nontender, nondistended. MS: No deformity or atrophy. Skin: Warm and dry, no rash. Neuro:  Strength and sensation are intact. Psych: Normal affect.  Assessment & Plan    Wall motion abnormalities on echocardiogram / High risk myoview - Echo 07/20/21 with normal LVEF and wall motion abnormalities. Lexiscan 09/06/21 with large size, severe severity mid to apical inferior mostly fixed perfusion defect suggestive of large RCA territory scar with peri infarcy ischemia. LVEF 29% though suspected falsely low. Discussed risk and benefit of cardiac cath given recent CVA and LV thrombus with potential for Lovenox bridging. Ultimately to further evaluate and avoid invasive procedures if at all possible, plan for cardiac CTA. As he has no new HF symptoms will defer repeat echocardiogram at this time. May consider pending results of cardiac CTA as  if will require catheterization could reassess LVEF at that time.  History of CVA-continue to follow with neurology.  Continue atorvastatin.  No aspirin due to chronic anticoagulation. Continues to work with [facility] on R hand and RLE defecits.   LV thrombus/anticoagulation-tolerating Coumadin without bleeding complications.  We will plan for repeat echocardiogram in 3 months to reassess LV thrombus scheduled for 11/29/21.  Educated to report hematuria, melena.  Repeat CBC at time of next Coumadin clinic appointment 10/04/21 for monitoring.  HTN- BP well controlled. Continue current antihypertensive regimen.    HLD, LDL goal less than 70-07/10/2021 cholesterol 179, HDL 30,  LDL 32, triglycerides 84.  Started on statin during his admission.  C-Met, lipid panel on 10/04/2021.   Disposition: Follow up as scheduled 12/2020 with Loel Dubonnet, NP   Signed, Loel Dubonnet, NP 09/07/2021, 8:55 PM [place]

## 2021-09-08 ENCOUNTER — Encounter (HOSPITAL_BASED_OUTPATIENT_CLINIC_OR_DEPARTMENT_OTHER): Payer: Self-pay | Admitting: Family

## 2021-09-08 ENCOUNTER — Ambulatory Visit (INDEPENDENT_AMBULATORY_CARE_PROVIDER_SITE_OTHER): Payer: Medicare Other | Admitting: Family

## 2021-09-08 ENCOUNTER — Other Ambulatory Visit: Payer: Self-pay

## 2021-09-08 VITALS — BP 128/80 | HR 89 | Ht 68.0 in | Wt 182.3 lb

## 2021-09-08 DIAGNOSIS — E785 Hyperlipidemia, unspecified: Secondary | ICD-10-CM

## 2021-09-08 DIAGNOSIS — I252 Old myocardial infarction: Secondary | ICD-10-CM | POA: Diagnosis not present

## 2021-09-08 DIAGNOSIS — R9439 Abnormal result of other cardiovascular function study: Secondary | ICD-10-CM

## 2021-09-08 DIAGNOSIS — Z8673 Personal history of transient ischemic attack (TIA), and cerebral infarction without residual deficits: Secondary | ICD-10-CM

## 2021-09-08 MED ORDER — METOPROLOL TARTRATE 100 MG PO TABS: ORAL_TABLET | ORAL | 0 refills | Status: DC

## 2021-09-08 NOTE — Patient Instructions (Addendum)
Medication Instructions:  Continue your current medications.   *If you need a refill on your cardiac medications before your next appointment, please call your pharmacy*  Lab Work: Your physician recommends that you return for lab work 3-5 days prior to cardiac CTA for BMP  If you have labs (blood work) drawn today and your tests are completely normal, you will receive your results only by: MyChart Message (if you have MyChart) OR A paper copy in the mail If you have any lab test that is abnormal or we need to change your treatment, we will call you to review the results.   Testing/Procedures: Your physician has requested that you have cardiac CT. Cardiac computed tomography (CT) is a painless test that uses an x-ray machine to take clear, detailed pictures of your heart.Please follow instruction sheet as given.  Follow-Up: At [facility], you and your health needs are our priority.  As part of our continuing mission to provide you with exceptional heart care, we have created designated Provider Care Teams.  These Care Teams include your primary Cardiologist (physician) and Advanced Practice Providers (APPs -  Physician Assistants and Nurse Practitioners) who all work together to provide you with the care you need, when you need it.  We recommend signing up for the patient portal called "MyChart".  Sign up information is provided on this After Visit Summary.  MyChart is used to connect with patients for Virtual Visits (Telemedicine).  Patients are able to view lab/test results, encounter notes, upcoming appointments, etc.  Non-urgent messages can be sent to your provider as well.   To learn more about what you can do with MyChart, go to [email or web address].    Your next appointment:   As scheduled with Alver Sorrow, NP    Other Instructions    Your cardiac CT will be scheduled at one of the below locations:   [facility] [address] [place], [place]  [address] [phone]  If scheduled at [facility], please arrive at the [facility] main entrance (entrance A) of [facility] 30 minutes prior to test start time. Proceed to the [facility] Radiology Department (first floor) to check-in and test prep.  Please follow these instructions carefully (unless otherwise directed):  Hold all erectile dysfunction medications at least 3 days (72 hrs) prior to test.  On the Night Before the Test: Be sure to Drink plenty of water. Do not consume any caffeinated/decaffeinated beverages or chocolate 12 hours prior to your test. Do not take any antihistamines 12 hours prior to your test.  On the Day of the Test: Drink plenty of water until 1 hour prior to the test. Do not eat any food 4 hours prior to the test. You may take your regular medications prior to the test.  Take metoprolol (Lopressor) two hours prior to test. HOLD Furosemide/Hydrochlorothiazide morning of the test.       After the Test: Drink plenty of water. After receiving IV contrast, you may experience a mild flushed feeling. This is normal. On occasion, you may experience a mild rash up to 24 hours after the test. This is not dangerous. If this occurs, you can take Benadryl 25 mg and increase your fluid intake. If you experience trouble breathing, this can be serious. If it is severe call 911 IMMEDIATELY. If it is mild, please call our office. If you take any of these medications: Glipizide/Metformin, Avandament, Glucavance, please do not take 48 hours after completing test unless  otherwise instructed.  Please allow 2-4 weeks for scheduling of routine cardiac CTs. Some insurance companies require a pre-authorization which may delay scheduling of this test.   For non-scheduling related questions, please contact the cardiac imaging nurse navigator should you have any questions/concerns: Rockwell [place], Cardiac Imaging Nurse Navigator Larey Brick, Cardiac Imaging Nurse  Navigator [facility] Direct Office Dial: [phone]   For scheduling needs, including cancellations and rescheduling, please call [place], [phone].

## 2021-09-09 ENCOUNTER — Other Ambulatory Visit (HOSPITAL_BASED_OUTPATIENT_CLINIC_OR_DEPARTMENT_OTHER): Payer: Self-pay | Admitting: Family

## 2021-09-09 DIAGNOSIS — R9439 Abnormal result of other cardiovascular function study: Secondary | ICD-10-CM

## 2021-09-21 ENCOUNTER — Other Ambulatory Visit (HOSPITAL_BASED_OUTPATIENT_CLINIC_OR_DEPARTMENT_OTHER): Payer: Self-pay | Admitting: Family

## 2021-09-21 DIAGNOSIS — R9439 Abnormal result of other cardiovascular function study: Secondary | ICD-10-CM

## 2021-09-21 MED ORDER — METOPROLOL TARTRATE 100 MG PO TABS: ORAL_TABLET | ORAL | 0 refills | Status: DC

## 2021-09-22 ENCOUNTER — Other Ambulatory Visit (HOSPITAL_BASED_OUTPATIENT_CLINIC_OR_DEPARTMENT_OTHER): Payer: Self-pay | Admitting: Family

## 2021-09-22 DIAGNOSIS — R9439 Abnormal result of other cardiovascular function study: Secondary | ICD-10-CM

## 2021-09-26 LAB — BASIC METABOLIC PANEL
BUN/Creatinine Ratio: 14 (ref 10–24)
BUN: 22 mg/dL (ref 8–27)
CO2: 24 mmol/L (ref 20–29)
Calcium: 8.9 mg/dL (ref 8.6–10.2)
Chloride: 106 mmol/L (ref 96–106)
Creatinine, Ser: 1.56 mg/dL — ABNORMAL HIGH (ref 0.76–1.27)
Glucose: 85 mg/dL (ref 70–99)
Potassium: 4.5 mmol/L (ref 3.5–5.2)
Sodium: 141 mmol/L (ref 134–144)
eGFR: 45 mL/min/{1.73_m2} — ABNORMAL LOW (ref >59)

## 2021-09-28 ENCOUNTER — Telehealth (HOSPITAL_COMMUNITY): Payer: Self-pay | Admitting: *Deleted

## 2021-09-28 NOTE — Telephone Encounter (Signed)
Reaching out to patient to offer assistance regarding upcoming cardiac imaging study; pt's daughter verbalizes understanding of appt date/time, parking situation and where to check in, pre-test NPO status and medications ordered, and verified current allergies; name and call back number provided for further questions should they arise  Larey Brick RN Navigator Cardiac Imaging [facility] [phone] office [phone] cell  Patient to take 100mg  metoprolol tartrate two hours prior to cardiac CT scan.

## 2021-09-29 ENCOUNTER — Other Ambulatory Visit: Payer: Self-pay

## 2021-09-29 ENCOUNTER — Ambulatory Visit (HOSPITAL_COMMUNITY)
Admission: RE | Admit: 2021-09-29 | Discharge: 2021-09-29 | Disposition: A | Discharge status: Home / Self Care | Payer: Medicare Other | Source: Ambulatory Visit | Attending: Family | Admitting: Family

## 2021-09-29 ENCOUNTER — Encounter: Payer: Self-pay | Admitting: Adult Health

## 2021-09-29 ENCOUNTER — Ambulatory Visit (INDEPENDENT_AMBULATORY_CARE_PROVIDER_SITE_OTHER): Payer: Medicare Other | Admitting: Adult Health

## 2021-09-29 VITALS — BP 138/88 | HR 60 | Ht 67.0 in | Wt 180.0 lb

## 2021-09-29 DIAGNOSIS — I63512 Cerebral infarction due to unspecified occlusion or stenosis of left middle cerebral artery: Secondary | ICD-10-CM

## 2021-09-29 DIAGNOSIS — I513 Intracardiac thrombosis, not elsewhere classified: Secondary | ICD-10-CM | POA: Diagnosis not present

## 2021-09-29 DIAGNOSIS — I252 Old myocardial infarction: Secondary | ICD-10-CM | POA: Insufficient documentation

## 2021-09-29 DIAGNOSIS — E785 Hyperlipidemia, unspecified: Secondary | ICD-10-CM

## 2021-09-29 DIAGNOSIS — I2510 Atherosclerotic heart disease of native coronary artery without angina pectoris: Secondary | ICD-10-CM | POA: Diagnosis not present

## 2021-09-29 DIAGNOSIS — I10 Essential (primary) hypertension: Secondary | ICD-10-CM

## 2021-09-29 DIAGNOSIS — R9439 Abnormal result of other cardiovascular function study: Secondary | ICD-10-CM | POA: Diagnosis present

## 2021-09-29 MED ORDER — NITROGLYCERIN 0.4 MG SL SUBL
0.8000 mg | SUBLINGUAL_TABLET | Freq: Once | SUBLINGUAL | Status: AC
  Administered 2021-09-29: 0.8 mg via SUBLINGUAL

## 2021-09-29 MED ORDER — NITROGLYCERIN 0.4 MG SL SUBL
SUBLINGUAL_TABLET | SUBLINGUAL | Status: AC
  Filled 2021-09-29: qty 2

## 2021-09-29 MED ORDER — IOHEXOL 350 MG/ML SOLN
95.0000 mL | Freq: Once | INTRAVENOUS | Status: AC | PRN
  Administered 2021-09-29: 95 mL via INTRAVENOUS

## 2021-09-29 NOTE — Patient Instructions (Addendum)
Continue warfarin daily  and atorvastatin 40mg  daily  for secondary stroke prevention  Continue to follow up with PCP regarding cholesterol and blood pressure management  Maintain strict control of hypertension with blood pressure goal below 130/90 and cholesterol with LDL cholesterol (bad cholesterol) goal below 70 mg/dL.   Continue working with therapies for hopeful ongoing recovery - continue to follow with Dr.  Continue to follow with cardiology for warfarin monitoring and management     Followup in the future with me in 4-5 months or call earlier if needed      Thank you for coming to see Roberto Dickson at [facility]. I hope we have been able to provide you high quality care today.  You may receive a patient satisfaction survey over the next few weeks. We would appreciate your feedback and comments so that we may continue to improve ourselves and the health of our patients.

## 2021-09-29 NOTE — Progress Notes (Signed)
[facility] [address] [place]. [place] [address] [phone]       HOSPITAL FOLLOW UP NOTE  [name] Date of Birth:  [birth year] Medical Record Number:  [id number]   Reason for Referral:  hospital stroke follow up    SUBJECTIVE:   CHIEF COMPLAINT:  Chief Complaint  Patient presents with   Follow-up    RM 2 with daughter Byrd Hesselbach (& cone interpreter) Pt is well, has some R hand weakness/shaking but overall stable     HPI:   [name] is a 80 y.o. male w/pmh of HTN who presented with 07/19/2021 with left gaze deviation, R facial droop and RUE weakness and aphasia.  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 Rt groin pseudoaneurysm post procedure s/p thrombin injection by VVS with repeat US showing resolution of pseudoaneurysm.  EF 50 to 55% with evidence of LV thrombus.  LDL 132.  A1c 5.9.  Initiated Coumadin and Lovenox bridge with INR goal 2-3 per cardiology recommendations.  Initiated atorvastatin 40 mg daily. Hx of HTN not on medical therapy. Therapy evals recommended CIR for posterior lean, right knee instability, and RUE weakness.   Today, 09/29/2021, being seen for initial hospital stroke follow up accompanied by daughter and [place] interpreter. Overall doing well since discharge. Currently working with [facility] PT - reports some improvement of right hand weakness - denies residual leg weakness.  Initially using RW but now no AD -denies any recent falls.  Reports hand tremor but this has been gradually improving - on tizanidine managed by PMR. Remains on warfarin without bleeding or bruising with prior INR 2.8 - has f/u scheduled 11/1 for repeat levels. Blood pressure today initially 160/84 (has not yet taken BP meds yet) and on  recheck 138/88 - does not routinely monitor at home. Has since had f/u with cardiology - stress test 10/4 with reduced ER 29% though suspected falsely low - plans on completing cardiac CTA this afternoon and may need to proceed with cardiac cath if indicated.  No further concerns at this time.        ROS:   14 system review of systems performed and negative with exception of those listed in HPI  PMH:  Past Medical History:  Diagnosis Date   Hypertension     PSH:  Past Surgical History:  Procedure Laterality Date   APPENDECTOMY     in his 19's   IR CT HEAD LTD  07/19/2021   IR FLUORO GUIDED NEEDLE PLC ASPIRATION/INJECTION LOC  07/21/2021   IR PERCUTANEOUS ART THROMBECTOMY/INFUSION INTRACRANIAL INC DIAG ANGIO  07/19/2021   RADIOLOGY WITH ANESTHESIA N/A 07/19/2021   Procedure: IR WITH ANESTHESIA;  Surgeon: Julieanne Cotton, MD;  Location: [facility] OR;  Service: Radiology;  Laterality: N/A;    Social History:  Social History   Socioeconomic History   Marital status: Married    Spouse name: Not on file   Number of children: Not on file   Years of education: Not on file   Highest education level: Not on file  Occupational History   Not on file  Tobacco Use   Smoking status: Never   Smokeless tobacco: Never  Vaping Use   Vaping Use: Never used  Substance and Sexual Activity   Alcohol use: Not Currently    Alcohol/week:  1.0 standard drink    Types: 1 Cans of beer per week   Drug use: Never   Sexual activity: Not on file  Other Topics Concern   Not on file  Social History Narrative   Not on file   Social Determinants of Health   Financial Resource Strain: Not on file  Food Insecurity: Not on file  Transportation Needs: Not on file  Physical Activity: Not on file  Stress: Not on file  Social Connections: Not on file  Intimate Partner Violence: Not on file    Family History:  Family History  Problem Relation Age of Onset   Cancer - Prostate Father      Medications:   Current Outpatient Medications on File Prior to Visit  Medication Sig Dispense Refill   acetaminophen (TYLENOL) 325 MG tablet Take 1-2 tablets (325-650 mg total) by mouth every 4 (four) hours as needed for mild pain.     atorvastatin (LIPITOR) 40 MG tablet Take 1 tablet (40 mg total) by mouth daily. 90 tablet 3   docusate sodium (COLACE) 100 MG capsule Take 1 capsule (100 mg total) by mouth 2 (two) times daily. 60 capsule 0   melatonin 3 MG TABS tablet Take 0.5 tablets (1.5 mg total) by mouth at bedtime. OVER THE COUNTER 30 tablet 0   metoprolol succinate (TOPROL-XL) 50 MG 24 hr tablet Take 1 tablet (50 mg total) by mouth at bedtime. 90 tablet 3   metoprolol tartrate (LOPRESSOR) 100 MG tablet TAKE 1 TABLET BY MOUTH 2 HOURS PRIOR TO CARDIAC CTA 1 tablet 0   pantoprazole (PROTONIX) 40 MG tablet TAKE 1 TABLET(40 MG) BY MOUTH AT BEDTIME 30 tablet 1   polyethylene glycol (MIRALAX / GLYCOLAX) 17 g packet Take 17 g by mouth daily. 30 each 0   tamsulosin (FLOMAX) 0.4 MG CAPS capsule TAKE 1 CAPSULE(0.4 MG) BY MOUTH DAILY 30 capsule 1   tiZANidine (ZANAFLEX) 2 MG tablet Take 1 tablet (2 mg total) by mouth at bedtime. 30 tablet 0   traZODone (DESYREL) 100 MG tablet TAKE 1 TABLET(100 MG) BY MOUTH AT BEDTIME AS NEEDED FOR SLEEP 30 tablet 1   warfarin (COUMADIN) 6 MG tablet TAKE 1 TABLET(6 MG) BY MOUTH DAILY WITH SUPPER 45 tablet 0   No current facility-administered medications on file prior to visit.    Allergies:  No Known Allergies    OBJECTIVE:  Physical Exam  Vitals:   09/29/21 0819  BP: 138/88  Pulse: 60  Weight: 180 lb (81.6 kg)  Height: 5\' 7"  (1.702 m)   Body mass index is 28.19 kg/m. No results found.  Post stroke PHQ 2/9 Depression screen [facility] 2/9 08/25/2021  Decreased Interest 0  Down, Depressed, Hopeless 0  PHQ - 2 Score 0  Altered sleeping 0  Tired, decreased energy 0  Change in appetite 0  Feeling bad or failure about yourself  1  Trouble  concentrating 1  Moving slowly or fidgety/restless 0  Suicidal thoughts 0  PHQ-9 Score 2     General: well developed, well nourished, very pleasant elderly Hispanic male, seated, in no evident distress Head: head normocephalic and atraumatic.   Neck: supple with no carotid or supraclavicular bruits Cardiovascular: regular rate and rhythm, no murmurs Musculoskeletal: no deformity Skin:  no rash/petichiae Vascular:  Normal pulses all extremities   Neurologic Exam Mental Status: Awake and fully alert.  Primarily Spanish-speaking -denies dysarthria or aphasia.  Oriented to place and time. Recent and remote memory intact. Attention span, concentration and  fund of knowledge appropriate. Mood and affect appropriate.  Cranial Nerves: Fundoscopic exam reveals sharp disc margins. Pupils equal, briskly reactive to light. Extraocular movements full without nystagmus. Visual fields full to confrontation. Hearing intact. Facial sensation intact. Face, tongue, palate moves normally and symmetrically.  Motor: Normal bulk and tone. Normal strength in all tested extremity muscles except mild weakness right hand grip strength and dexterity Sensory.:  Slight decreased light touch and vibratory sensation RUE; decreased sensory BLE.  Coordination: Rapid alternating movements normal in all extremities except decreased right hand. Finger-to-nose and heel-to-shin performed accurately bilaterally.  Orbits left arm over right arm.  Mild action tremor RUE with outstretched arms Gait and Station: Arises from chair without difficulty. Stance is normal. Gait demonstrates normal stride length and mild imbalance without use of assistive device. Tandem walk and heel toe with mild difficulty.  Reflexes: 1+ and symmetric. Toes downgoing.     NIHSS  2 Modified Rankin  2-3      ASSESSMENT: Roberto Dickson is a 80 y.o. 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 Vascular risk factors include HTN and HLD.      PLAN:  Left MCA strokes:  Residual deficit: mild right hand weakness with mild tremor. Continue working with [facility] PT for likely ongoing recovery.  Educated on tremor likely in setting of weakness.  Routinely followed by Dr. Allena Katz PMR Continue warfarin daily  and atorvastatin for secondary stroke prevention.   Discussed secondary stroke prevention measures and importance of close PCP follow up for aggressive stroke risk factor management. I have gone over the pathophysiology of stroke, warning signs and symptoms, risk factors and their management in some detail with instructions to go to the closest emergency room for symptoms of concern. LV thrombus: Continue warfarin with INR goal 2-3.  Routinely followed by cardiology with plans on repeating 2D echo in 2 months for surveillance monitoring HTN: BP goal <130/90.  Stable on current regimen per PCP HLD: LDL goal <70. Recent LDL 132.  Continue atorvastatin 40mg  daily. Request management/monitoring per PCP    Follow up in 4 to 5 months or call earlier if needed   CC:  [facility] provider: Dr. PCP: Pearlean Brownie, MD    I spent 58 minutes of face-to-face and non-face-to-face time with patient and daughter assisted by interpreter.  This included previsit chart review including review of recent hospitalization, lab review, study review, electronic health record documentation, patient and daughter education regarding recent stroke including etiology, secondary stroke prevention measures and importance of managing stroke risk factors, residual deficits and typical recovery time and answered all other questions to patient and daughters satisfaction  [name], AGNP-BC  [facility] [address] [place], [place]  Phone [phone] Fax [phone] Note:  This document was prepared with digital dictation and possible smart phrase technology. Any transcriptional errors that result from this process are unintentional.

## 2021-10-01 ENCOUNTER — Other Ambulatory Visit: Payer: Self-pay | Admitting: Physical Medicine & Rehabilitation

## 2021-10-04 ENCOUNTER — Ambulatory Visit (INDEPENDENT_AMBULATORY_CARE_PROVIDER_SITE_OTHER): Payer: Medicare Other

## 2021-10-04 ENCOUNTER — Other Ambulatory Visit: Payer: Medicare Other

## 2021-10-04 ENCOUNTER — Other Ambulatory Visit: Payer: Self-pay

## 2021-10-04 DIAGNOSIS — I63512 Cerebral infarction due to unspecified occlusion or stenosis of left middle cerebral artery: Secondary | ICD-10-CM | POA: Diagnosis not present

## 2021-10-04 DIAGNOSIS — Z5181 Encounter for therapeutic drug level monitoring: Secondary | ICD-10-CM | POA: Diagnosis not present

## 2021-10-04 DIAGNOSIS — I513 Intracardiac thrombosis, not elsewhere classified: Secondary | ICD-10-CM | POA: Diagnosis not present

## 2021-10-04 LAB — POCT INR: INR: 5.4 — AB (ref 2.0–3.0)

## 2021-10-04 NOTE — Patient Instructions (Signed)
Description   Skip today and tomorrow's dosage of Warfarin, then start taking Warfarin 1 tablet daily except 1/2 tablet on Sundays and Thursdays. Recheck INR in 1 week. Coumadin Clinic [phone]; pt would like Coumadin managed at [facility] site (vicinity).

## 2021-10-06 ENCOUNTER — Telehealth: Payer: Self-pay

## 2021-10-06 NOTE — Telephone Encounter (Signed)
Patient's daughter is calling to get refills on Pantoprazole and Tamsulosin. Please advise.

## 2021-10-06 NOTE — Telephone Encounter (Signed)
Left voicemail to return call to clinic to inform her that PCP should send in the Pantoprazole and Tamsulosin

## 2021-10-07 NOTE — Telephone Encounter (Signed)
Patient's daughter, Byrd Hesselbach, notified that PCP should send in these medications of Pantoprazole and Tamsulosin

## 2021-10-13 ENCOUNTER — Ambulatory Visit (INDEPENDENT_AMBULATORY_CARE_PROVIDER_SITE_OTHER): Payer: Medicare Other | Admitting: *Deleted

## 2021-10-13 ENCOUNTER — Other Ambulatory Visit: Payer: Self-pay | Admitting: Family

## 2021-10-13 ENCOUNTER — Encounter (HOSPITAL_BASED_OUTPATIENT_CLINIC_OR_DEPARTMENT_OTHER): Payer: Self-pay | Admitting: Family

## 2021-10-13 ENCOUNTER — Ambulatory Visit (INDEPENDENT_AMBULATORY_CARE_PROVIDER_SITE_OTHER): Payer: Medicare Other | Admitting: Family

## 2021-10-13 ENCOUNTER — Other Ambulatory Visit: Payer: Self-pay

## 2021-10-13 ENCOUNTER — Other Ambulatory Visit: Payer: Medicare Other | Admitting: *Deleted

## 2021-10-13 VITALS — BP 142/90 | HR 61 | Ht 67.0 in | Wt 179.0 lb

## 2021-10-13 DIAGNOSIS — I513 Intracardiac thrombosis, not elsewhere classified: Secondary | ICD-10-CM

## 2021-10-13 DIAGNOSIS — I5022 Chronic systolic (congestive) heart failure: Secondary | ICD-10-CM

## 2021-10-13 DIAGNOSIS — E785 Hyperlipidemia, unspecified: Secondary | ICD-10-CM

## 2021-10-13 DIAGNOSIS — I25118 Atherosclerotic heart disease of native coronary artery with other forms of angina pectoris: Secondary | ICD-10-CM | POA: Diagnosis not present

## 2021-10-13 DIAGNOSIS — I63512 Cerebral infarction due to unspecified occlusion or stenosis of left middle cerebral artery: Secondary | ICD-10-CM | POA: Diagnosis not present

## 2021-10-13 DIAGNOSIS — Z8673 Personal history of transient ischemic attack (TIA), and cerebral infarction without residual deficits: Secondary | ICD-10-CM

## 2021-10-13 DIAGNOSIS — I10 Essential (primary) hypertension: Secondary | ICD-10-CM | POA: Diagnosis not present

## 2021-10-13 DIAGNOSIS — Z5181 Encounter for therapeutic drug level monitoring: Secondary | ICD-10-CM

## 2021-10-13 LAB — POCT INR: INR: 2.4 (ref 2.0–3.0)

## 2021-10-13 MED ORDER — ENOXAPARIN SODIUM 120 MG/0.8ML IJ SOSY: 120.0000 mg | PREFILLED_SYRINGE | INTRAMUSCULAR | 1 refills | Status: DC

## 2021-10-13 NOTE — Patient Instructions (Signed)
Medication Instructions:  Continue your current medications.   *If you need a refill on your cardiac medications before your next appointment, please call your pharmacy*   Lab Work: Your physician recommends that you return for lab work today at [facility] for CBC, BMP.   If you have labs (blood work) drawn today and your tests are completely normal, you will receive your results only by: MyChart Message (if you have MyChart) OR A paper copy in the mail If you have any lab test that is abnormal or we need to change your treatment, we will call you to review the results.   Testing/Procedures: Your physician has requested that you have a cardiac catheterization. Cardiac catheterization is used to diagnose and/or treat various heart conditions. Doctors may recommend this procedure for a number of different reasons. The most common reason is to evaluate chest pain. Chest pain can be a symptom of coronary artery disease (CAD), and cardiac catheterization can show whether plaque is narrowing or blocking your heart's arteries. This procedure is also used to evaluate the valves, as well as measure the blood flow and oxygen levels in different parts of your heart.  Please follow instruction sheet, as given.   Follow-Up: At [facility], you and your health needs are our priority.  As part of our continuing mission to provide you with exceptional heart care, we have created designated Provider Care Teams.  These Care Teams include your primary Cardiologist (physician) and Advanced Practice Providers (APPs -  Physician Assistants and Nurse Practitioners) who all work together to provide you with the care you need, when you need it.  We recommend signing up for the patient portal called "MyChart".  Sign up information is provided on this After Visit Summary.  MyChart is used to connect with patients for Virtual Visits (Telemedicine).  Patients are able to view lab/test results, encounter notes,  upcoming appointments, etc.  Non-urgent messages can be sent to your provider as well.   To learn more about what you can do with MyChart, go to [email or web address].    Your next appointment:   In 3 weeks with Dr. Cristal Deer or APP    Other Instructions   [facility] MEDCENTER MEDCENTER [facility] CARDIOLOGY [address] [place] [address] Dept: [phone]  Roberto Dickson  10/13/2021  You are scheduled for a Cardiac Catheterization on Thursday, November 17 with Dr. Verne Carrow.  1. Please arrive at the [facility] (Main Entrance A) at [facility]: [address] [place], [place] [address] at 5:30 AM (This time is two hours before your procedure to ensure your preparation). Free valet parking service is available.   Special note: Every effort is made to have your procedure done on time. Please understand that emergencies sometimes delay scheduled procedures.  2. Diet: Do not eat solid foods after midnight.  The patient may have clear liquids until 5am upon the day of the procedure.  3. Labs: You will need to have blood drawn on 10/13/2021 at [facility]  4. Medication instructions in preparation for your procedure:   Contrast Allergy: No  HOLD COUMADIN AS DIRECTED BY COUMADIN CLINIC.   On the morning of your procedure, take your Aspirin and any morning medicines NOT listed above.  You may use sips of water.  5. Plan for one night stay--bring personal belongings. 6. Bring a current list of your medications and current insurance cards. 7. You MUST have a responsible person to drive you home.  8. Someone MUST be with you the first 24 hours after you arrive home or your discharge will be delayed. 9. Please wear clothes that are easy to get on and off and wear slip-on shoes.  Thank you for allowing [place] to care for you!   -- [facility]

## 2021-10-13 NOTE — Patient Instructions (Addendum)
Description   Continue taking Warfarin 1 tablet daily except 1/2 tablet on Sundays and Thursdays. Recheck INR in 1 week post procedure. Coumadin Clinic [phone]; pt would like Coumadin managed at [address] site (vicinity).     11 /11: Last dose of warfarin.  11/12: No warfarin or enoxaparin (Lovenox).  11/13: Inject enoxaparin 120mg  in the fatty abdominal tissue at least 2 inches from the belly button daily at 8pm, rotate sites. No warfarin.  11/14: Inject enoxaparin in the fatty tissue every 24 hours at 8pm. No warfarin.  11/15: Inject enoxaparin in the fatty tissue every 24 hours at 8pm. No warfarin.  11/16: No enoxaparin. No warfarin.  11/17: Procedure Day - No enoxaparin - Resume warfarin in the evening or as directed by doctor (take an extra half tablet with usual dose for 2 days then resume normal dose).  11/18: Resume enoxaparin inject in the fatty tissue every 24 hours at 8am and take warfarin  11/19: Inject enoxaparin in the fatty tissue every 24 hours at 8am and take warfarin  11/20: Inject enoxaparin in the fatty tissue every 24 hours at 8am and take warfarin  11/21: Inject enoxaparin in the fatty tissue every 24 hours at 8am and take warfarin  11/22: Inject enoxaparin in the fatty tissue every 24 hours at 8am and take warfarin  11/23: Inject enoxaparin in the fatty tissue every 24 hours at 8am and warfarin appt to check INR at 315pm.

## 2021-10-13 NOTE — Addendum Note (Signed)
Addended by: Alver Sorrow on: 10/13/2021 03:53 PM   Modules accepted: Orders, SmartSet

## 2021-10-13 NOTE — Progress Notes (Signed)
Office Visit    Patient Name: Roberto Dickson Date of Encounter: 10/13/2021  PCP:  [name], MD   [facility]  Cardiologist:  Jodelle Red, MD  Advanced Practice Provider:  No care team member to display Electrophysiologist:  None      Chief Complaint    Roberto Dickson is a 80 y.o. male with a hx of CVA, HTN, HLD, LV thrombus, CAD presents today for follow-up after abnormal cardiac CTA.   Past Medical History    Past Medical History:  Diagnosis Date   Hypertension    Past Surgical History:  Procedure Laterality Date   APPENDECTOMY     in his 65's   IR CT HEAD LTD  07/19/2021   IR FLUORO GUIDED NEEDLE PLC ASPIRATION/INJECTION LOC  07/21/2021   IR PERCUTANEOUS ART THROMBECTOMY/INFUSION INTRACRANIAL INC DIAG ANGIO  07/19/2021   RADIOLOGY WITH ANESTHESIA N/A 07/19/2021   Procedure: IR WITH ANESTHESIA;  Surgeon: Julieanne Cotton, MD;  Location: [facility] OR;  Service: Radiology;  Laterality: N/A;    Allergies  No Known Allergies  History of Present Illness    Roberto Dickson is a 80 y.o. male with a hx of CVA, HTN, HLD, LV thrombus, CAD last 08/31/21.  He was admitted 07/26/2021 - 08/10/2021 with acute ischemic left middle cerebral artery (MCA) stroke.  He received tPA and additionally underwent cerebral angiography with revascularization of left ICA from proximal to distal terminus and origin of left MCA.  Echocardiogram with LVEF 50 to 55% with apical thrombus and Coumadin started.  There were wall motion abnormalities noted by echocardiogram but this was recommended for outpatient duration.[name]  He was recommended for CIR.  Seen in follow up 08/31/21 with his daughter. He was working with [facility] PT with improvement in strength in right hand and leg but still fair ways to go to get back to baseline. He reported no chest pain nor dyspnea. Stress test was recommended due to wall motion abnormalities by echo. Stress test 09/06/21 with large  size, severe severity mid to apical inferior mostly fixed perfusion defect suggestive of large RCA territory scar with peri-infarct ischemia. LVEF read as 29% but suspected to be falsely low.  Cardiac CT 10/2022With calcium score 6053 and significant three vessel disease with likely CTO of LAD. Discussed with Dr. Cristal Deer and will require cardiac catheterization.   Presents today for follow up. Cardiac CT reviewed. Visit assisted by daughter and interpretor. Reports no chest pain nor dyspnea. Will plan for cardiac catheterization. Discussed that surgery is probable outcome given significant atherosclerosis.   EKGs/Labs/Other Studies Reviewed:   The following studies were reviewed today:  Cardiac CT 09/29/21 IMPRESSION: 1. Coronary calcium score of 6053. This was 99th percentile for age-, sex, and race-matched controls.   2. Normal coronary origin with right dominance.   3. 100% occlusion of the mid LAD that likely a chronic total occlusion with collateral flow filling the vessel distally.   4. Moderate (50-69%) mixed density plaque in the mid LCX.   5. Severe stenosis in the proximal RCA (70-99%) with 100% occlusion versus subtotal occlusion of the mid RCA.   6. The apical myocardium is thinned and aneurysmal consistent with prior LAD infarction.   7. There is a filling defect in the apex (measures 26 mm x 26 mm) consistent with LV thrombus.   RECOMMENDATIONS: 1. Cardiac catheterization is recommended due to concerns for 3-vessel CAD.   2. LV thrombus is known from prior imaging.  Myoview 09/07/21   Findings are consistent with prior myocardial infarction with peri-infarct ischemia. The study is high risk.   No ST deviation was noted.   LV perfusion is abnormal. There is no evidence of ischemia. There is evidence of infarction. Defect 1: There is a large defect with severe reduction in uptake present in the apical to mid inferior location(s) that is partially reversible.  Viability is present. There is abnormal wall motion in the defect area. Consistent with infarction and peri-infarct ischemia.   Left ventricular function is abnormal. Global function is severely reduced. There was a single regional abnormality. Nuclear stress EF: 29 %. The left ventricular ejection fraction is severely decreased (<30%). End diastolic cavity size is mildly enlarged. End systolic cavity size is mildly enlarged.   Prior study not available for comparison.   Large size, severe severity mid to apical inferior mostly fixed perfusion defect, suggestive of large RCA territory scar with peri-infarct ischemia (SDS 5). LVEF 29% with inferior and apical akinesis (suspect LVEF is falsely low, recommend echo correlation). This is a high risk study. No prior for comparison.  Echo 07/20/21  1. 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 Apical, apical anterior,  and apical septal akinesis. Inferoseptal hypokinesis. Left ventricular  ejection fraction, by estimation, is 50 to 55%. The left ventricle has low  normal function. The left  ventricle demonstrates regional wall motion abnormalities (see scoring  diagram/findings for description). Left ventricular diastolic parameters  are consistent with Grade I diastolic dysfunction (impaired relaxation).   2. Right ventricular systolic function is normal. The right ventricular  size is normal.   3. The mitral valve is normal in structure. Trivial mitral valve  regurgitation. No evidence of mitral stenosis.   4. The aortic valve is normal in structure. Aortic valve regurgitation is  not visualized. No aortic stenosis is present.   5. The inferior vena cava is normal in size with greater than 50%  respiratory variability, suggesting right atrial pressure of 3 mmHg.   EKG:EKG ordered today. The EKG performed today demonstrates  NSR 61 bpm with overall flat T-waves in lateral leads. TWI noted in lead III.  Recent Labs: 07/21/2021: B Natriuretic  Peptide 82.3 07/27/2021: ALT 29 08/25/2021: Hemoglobin 12.3; Platelets 286 09/26/2021: BUN 22; Creatinine, Ser 1.56; Potassium 4.5; Sodium 141  Recent Lipid Panel    Component Value Date/Time   CHOL 179 07/20/2021 0536   TRIG 89 07/21/2021 0432   HDL 30 (L) 07/20/2021 0536   CHOLHDL 6.0 07/20/2021 0536   VLDL 17 07/20/2021 0536   LDLCALC 132 (H) 07/20/2021 0536   Home Medications   Current Meds  Medication Sig   acetaminophen (TYLENOL) 325 MG tablet Take 1-2 tablets (325-650 mg total) by mouth every 4 (four) hours as needed for mild pain.   atorvastatin (LIPITOR) 40 MG tablet Take 1 tablet (40 mg total) by mouth daily.   docusate sodium (COLACE) 100 MG capsule Take 1 capsule (100 mg total) by mouth 2 (two) times daily.   melatonin 3 MG TABS tablet Take 0.5 tablets (1.5 mg total) by mouth at bedtime. OVER THE COUNTER   metoprolol succinate (TOPROL-XL) 50 MG 24 hr tablet Take 1 tablet (50 mg total) by mouth at bedtime.   metoprolol tartrate (LOPRESSOR) 100 MG tablet TAKE 1 TABLET BY MOUTH 2 HOURS PRIOR TO CARDIAC CTA   pantoprazole (PROTONIX) 40 MG tablet TAKE 1 TABLET(40 MG) BY MOUTH AT BEDTIME   polyethylene glycol (MIRALAX / GLYCOLAX) 17 g packet  Take 17 g by mouth daily.   tamsulosin (FLOMAX) 0.4 MG CAPS capsule TAKE 1 CAPSULE(0.4 MG) BY MOUTH DAILY   tiZANidine (ZANAFLEX) 2 MG tablet TAKE 1 TABLET(2 MG) BY MOUTH AT BEDTIME   warfarin (COUMADIN) 6 MG tablet TAKE 1 TABLET(6 MG) BY MOUTH DAILY WITH SUPPER     Review of Systems    All other systems reviewed and are otherwise negative except as noted above.  Physical Exam    VS:  BP (!) 142/90   Pulse 61   Ht 5\' 7"  (1.702 m)   Wt 179 lb (81.2 kg)   BMI 28.04 kg/m  , BMI Body mass index is 28.04 kg/m.  Wt Readings from Last 3 Encounters:  10/13/21 179 lb (81.2 kg)  09/29/21 180 lb (81.6 kg)  09/08/21 182 lb 4.8 oz (82.7 kg)    GEN: Well nourished, well developed, in no acute distress. HEENT: normal. Neck: Supple, no  JVD, carotid bruits, or masses. Cardiac: RRR, no murmurs, rubs, or gallops. No clubbing, cyanosis, edema.  Radials/PT 2+ and equal bilaterally.  Respiratory:  Respirations regular and unlabored, clear to auscultation bilaterally. GI: Soft, nontender, nondistended. MS: No deformity or atrophy. Skin: Warm and dry, no rash. Neuro:  Strength and sensation are intact. Psych: Normal affect.  Assessment & Plan   Wall motion abnormalities on echocardiogram / High risk myoview - Echo 07/20/21 with normal LVEF and wall motion abnormalities. Lexiscan 09/06/21 with large size, severe severity mid to apical inferior mostly fixed perfusion defect suggestive of large RCA territory scar with peri infarcy ischemia. LVEF 29% though suspected falsely low. Cardiac CTA wit significant 3 vessel disease detailed above. Surprisingly reports no chest pain nor dyspnea. Plan for cardiac catheterization. Case and plan discussed with Dr. Harrell Gave. CBC, BMP today. Will require Lovenox bridge due to LV thrombus.   Shared Decision Making/Informed Consent The risks [stroke (1 in 1000), death (1 in 1000), kidney failure [usually temporary] (1 in 500), bleeding (1 in 200), allergic reaction [possibly serious] (1 in 200)], benefits (diagnostic support and management of coronary artery disease) and alternatives of a cardiac catheterization were discussed in detail with Mr. Saber and he is willing to proceed.   History of CVA-Continue to follow with neurology.  Continue atorvastatin.  No aspirin due to chronic anticoagulation. Continues to work with [facility] on R hand and RLE defecits.   LV thrombus/anticoagulation-tolerating Coumadin without bleeding complications.  We will plan for repeat echocardiogram in 3 months to reassess LV thrombus scheduled for 11/29/21.  Educated to report hematuria, melena.  Has Coumadin Clinic appointment today. Updated to anticipate Lovenox bridge with cardiac cath.   HTN- BP mildly elevated today but  usually well controlled. Will monitor carefully and continue current antihypertensive regimen.   HLD, LDL goal less than 70-07/10/2021 cholesterol 179, HDL 30, LDL 32, triglycerides 84.  Started on statin during his previous admission. Ate breakfast today and as such will defer repeat lipid panel.  Disposition: Follow up in 3 weeks with Dr. Harrell Gave or APP.  Signed, Loel Dubonnet, NP 10/13/2021, 8:44 AM [place]

## 2021-10-13 NOTE — H&P (View-Only) (Signed)
Office Visit    Patient Name: Roberto Dickson Date of Encounter: 10/13/2021  PCP:  [name], MD   [facility]  Cardiologist:  Jodelle Red, MD  Advanced Practice Provider:  No care team member to display Electrophysiologist:  None      Chief Complaint    Roberto Dickson is a 80 y.o. male with a hx of CVA, HTN, HLD, LV thrombus, CAD presents today for follow-up after abnormal cardiac CTA.   Past Medical History    Past Medical History:  Diagnosis Date   Hypertension    Past Surgical History:  Procedure Laterality Date   APPENDECTOMY     in his 67's   IR CT HEAD LTD  07/19/2021   IR FLUORO GUIDED NEEDLE PLC ASPIRATION/INJECTION LOC  07/21/2021   IR PERCUTANEOUS ART THROMBECTOMY/INFUSION INTRACRANIAL INC DIAG ANGIO  07/19/2021   RADIOLOGY WITH ANESTHESIA N/A 07/19/2021   Procedure: IR WITH ANESTHESIA;  Surgeon: Julieanne Cotton, MD;  Location: [facility] OR;  Service: Radiology;  Laterality: N/A;    Allergies  No Known Allergies  History of Present Illness    Roberto Dickson is a 80 y.o. male with a hx of CVA, HTN, HLD, LV thrombus, CAD last 08/31/21.  He was admitted 07/26/2021 - 08/10/2021 with acute ischemic left middle cerebral artery (MCA) stroke.  He received tPA and additionally underwent cerebral angiography with revascularization of left ICA from proximal to distal terminus and origin of left MCA.  Echocardiogram with LVEF 50 to 55% with apical thrombus and Coumadin started.  There were wall motion abnormalities noted by echocardiogram but this was recommended for outpatient duration.[name]  He was recommended for CIR.  Seen in follow up 08/31/21 with his daughter. He was working with [facility] PT with improvement in strength in right hand and leg but still fair ways to go to get back to baseline. He reported no chest pain nor dyspnea. Stress test was recommended due to wall motion abnormalities by echo. Stress test 09/06/21 with large  size, severe severity mid to apical inferior mostly fixed perfusion defect suggestive of large RCA territory scar with peri-infarct ischemia. LVEF read as 29% but suspected to be falsely low.  Cardiac CT 10/2022With calcium score 6053 and significant three vessel disease with likely CTO of LAD. Discussed with Dr. Cristal Deer and will require cardiac catheterization.   Presents today for follow up. Cardiac CT reviewed. Visit assisted by daughter and interpretor. Reports no chest pain nor dyspnea. Will plan for cardiac catheterization. Discussed that surgery is probable outcome given significant atherosclerosis.   EKGs/Labs/Other Studies Reviewed:   The following studies were reviewed today:  Cardiac CT 09/29/21 IMPRESSION: 1. Coronary calcium score of 6053. This was 99th percentile for age-, sex, and race-matched controls.   2. Normal coronary origin with right dominance.   3. 100% occlusion of the mid LAD that likely a chronic total occlusion with collateral flow filling the vessel distally.   4. Moderate (50-69%) mixed density plaque in the mid LCX.   5. Severe stenosis in the proximal RCA (70-99%) with 100% occlusion versus subtotal occlusion of the mid RCA.   6. The apical myocardium is thinned and aneurysmal consistent with prior LAD infarction.   7. There is a filling defect in the apex (measures 26 mm x 26 mm) consistent with LV thrombus.   RECOMMENDATIONS: 1. Cardiac catheterization is recommended due to concerns for 3-vessel CAD.   2. LV thrombus is known from prior imaging.  Myoview 09/07/21   Findings are consistent with prior myocardial infarction with peri-infarct ischemia. The study is high risk.   No ST deviation was noted.   LV perfusion is abnormal. There is no evidence of ischemia. There is evidence of infarction. Defect 1: There is a large defect with severe reduction in uptake present in the apical to mid inferior location(s) that is partially reversible.  Viability is present. There is abnormal wall motion in the defect area. Consistent with infarction and peri-infarct ischemia.   Left ventricular function is abnormal. Global function is severely reduced. There was a single regional abnormality. Nuclear stress EF: 29 %. The left ventricular ejection fraction is severely decreased (<30%). End diastolic cavity size is mildly enlarged. End systolic cavity size is mildly enlarged.   Prior study not available for comparison.   Large size, severe severity mid to apical inferior mostly fixed perfusion defect, suggestive of large RCA territory scar with peri-infarct ischemia (SDS 5). LVEF 29% with inferior and apical akinesis (suspect LVEF is falsely low, recommend echo correlation). This is a high risk study. No prior for comparison.  Echo 07/20/21  1. 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 Apical, apical anterior,  and apical septal akinesis. Inferoseptal hypokinesis. Left ventricular  ejection fraction, by estimation, is 50 to 55%. The left ventricle has low  normal function. The left  ventricle demonstrates regional wall motion abnormalities (see scoring  diagram/findings for description). Left ventricular diastolic parameters  are consistent with Grade I diastolic dysfunction (impaired relaxation).   2. Right ventricular systolic function is normal. The right ventricular  size is normal.   3. The mitral valve is normal in structure. Trivial mitral valve  regurgitation. No evidence of mitral stenosis.   4. The aortic valve is normal in structure. Aortic valve regurgitation is  not visualized. No aortic stenosis is present.   5. The inferior vena cava is normal in size with greater than 50%  respiratory variability, suggesting right atrial pressure of 3 mmHg.   EKG:EKG ordered today. The EKG performed today demonstrates  NSR 61 bpm with overall flat T-waves in lateral leads. TWI noted in lead III.  Recent Labs: 07/21/2021: B Natriuretic  Peptide 82.3 07/27/2021: ALT 29 08/25/2021: Hemoglobin 12.3; Platelets 286 09/26/2021: BUN 22; Creatinine, Ser 1.56; Potassium 4.5; Sodium 141  Recent Lipid Panel    Component Value Date/Time   CHOL 179 07/20/2021 0536   TRIG 89 07/21/2021 0432   HDL 30 (L) 07/20/2021 0536   CHOLHDL 6.0 07/20/2021 0536   VLDL 17 07/20/2021 0536   LDLCALC 132 (H) 07/20/2021 0536   Home Medications   Current Meds  Medication Sig   acetaminophen (TYLENOL) 325 MG tablet Take 1-2 tablets (325-650 mg total) by mouth every 4 (four) hours as needed for mild pain.   atorvastatin (LIPITOR) 40 MG tablet Take 1 tablet (40 mg total) by mouth daily.   docusate sodium (COLACE) 100 MG capsule Take 1 capsule (100 mg total) by mouth 2 (two) times daily.   melatonin 3 MG TABS tablet Take 0.5 tablets (1.5 mg total) by mouth at bedtime. OVER THE COUNTER   metoprolol succinate (TOPROL-XL) 50 MG 24 hr tablet Take 1 tablet (50 mg total) by mouth at bedtime.   metoprolol tartrate (LOPRESSOR) 100 MG tablet TAKE 1 TABLET BY MOUTH 2 HOURS PRIOR TO CARDIAC CTA   pantoprazole (PROTONIX) 40 MG tablet TAKE 1 TABLET(40 MG) BY MOUTH AT BEDTIME   polyethylene glycol (MIRALAX / GLYCOLAX) 17 g packet  Take 17 g by mouth daily.   tamsulosin (FLOMAX) 0.4 MG CAPS capsule TAKE 1 CAPSULE(0.4 MG) BY MOUTH DAILY   tiZANidine (ZANAFLEX) 2 MG tablet TAKE 1 TABLET(2 MG) BY MOUTH AT BEDTIME   warfarin (COUMADIN) 6 MG tablet TAKE 1 TABLET(6 MG) BY MOUTH DAILY WITH SUPPER     Review of Systems    All other systems reviewed and are otherwise negative except as noted above.  Physical Exam    VS:  BP (!) 142/90   Pulse 61   Ht 5\' 7"  (1.702 m)   Wt 179 lb (81.2 kg)   BMI 28.04 kg/m  , BMI Body mass index is 28.04 kg/m.  Wt Readings from Last 3 Encounters:  10/13/21 179 lb (81.2 kg)  09/29/21 180 lb (81.6 kg)  09/08/21 182 lb 4.8 oz (82.7 kg)    GEN: Well nourished, well developed, in no acute distress. HEENT: normal. Neck: Supple, no  JVD, carotid bruits, or masses. Cardiac: RRR, no murmurs, rubs, or gallops. No clubbing, cyanosis, edema.  Radials/PT 2+ and equal bilaterally.  Respiratory:  Respirations regular and unlabored, clear to auscultation bilaterally. GI: Soft, nontender, nondistended. MS: No deformity or atrophy. Skin: Warm and dry, no rash. Neuro:  Strength and sensation are intact. Psych: Normal affect.  Assessment & Plan   Wall motion abnormalities on echocardiogram / High risk myoview - Echo 07/20/21 with normal LVEF and wall motion abnormalities. Lexiscan 09/06/21 with large size, severe severity mid to apical inferior mostly fixed perfusion defect suggestive of large RCA territory scar with peri infarcy ischemia. LVEF 29% though suspected falsely low. Cardiac CTA wit significant 3 vessel disease detailed above. Surprisingly reports no chest pain nor dyspnea. Plan for cardiac catheterization. Case and plan discussed with Dr. Harrell Gave. CBC, BMP today. Will require Lovenox bridge due to LV thrombus.   Shared Decision Making/Informed Consent The risks [stroke (1 in 1000), death (1 in 1000), kidney failure [usually temporary] (1 in 500), bleeding (1 in 200), allergic reaction [possibly serious] (1 in 200)], benefits (diagnostic support and management of coronary artery disease) and alternatives of a cardiac catheterization were discussed in detail with Roberto Dickson and he is willing to proceed.   History of CVA-Continue to follow with neurology.  Continue atorvastatin.  No aspirin due to chronic anticoagulation. Continues to work with [facility] on R hand and RLE defecits.   LV thrombus/anticoagulation-tolerating Coumadin without bleeding complications.  We will plan for repeat echocardiogram in 3 months to reassess LV thrombus scheduled for 11/29/21.  Educated to report hematuria, melena.  Has Coumadin Clinic appointment today. Updated to anticipate Lovenox bridge with cardiac cath.   HTN- BP mildly elevated today but  usually well controlled. Will monitor carefully and continue current antihypertensive regimen.   HLD, LDL goal less than 70-07/10/2021 cholesterol 179, HDL 30, LDL 32, triglycerides 84.  Started on statin during his previous admission. Ate breakfast today and as such will defer repeat lipid panel.  Disposition: Follow up in 3 weeks with Dr. Harrell Gave or APP.  Signed, Loel Dubonnet, NP 10/13/2021, 8:44 AM [place]

## 2021-10-17 LAB — COMPREHENSIVE METABOLIC PANEL
ALT: 19 IU/L (ref 0–44)
AST: 20 IU/L (ref 0–40)
Albumin/Globulin Ratio: 1.4 (ref 1.2–2.2)
Albumin: 3.7 g/dL (ref 3.7–4.7)
Alkaline Phosphatase: 86 IU/L (ref 44–121)
BUN/Creatinine Ratio: 15 (ref 10–24)
BUN: 23 mg/dL (ref 8–27)
Bilirubin Total: 0.6 mg/dL (ref 0.0–1.2)
CO2: 25 mmol/L (ref 20–29)
Calcium: 9 mg/dL (ref 8.6–10.2)
Chloride: 106 mmol/L (ref 96–106)
Creatinine, Ser: 1.49 mg/dL — ABNORMAL HIGH (ref 0.76–1.27)
Globulin, Total: 2.7 g/dL (ref 1.5–4.5)
Glucose: 79 mg/dL (ref 70–99)
Potassium: 4.3 mmol/L (ref 3.5–5.2)
Sodium: 140 mmol/L (ref 134–144)
Total Protein: 6.4 g/dL (ref 6.0–8.5)
eGFR: 48 mL/min/{1.73_m2} — ABNORMAL LOW (ref >59)

## 2021-10-17 LAB — CBC
Hematocrit: 38.2 % (ref 37.5–51.0)
Hemoglobin: 12.5 g/dL — ABNORMAL LOW (ref 13.0–17.7)
MCH: 29.6 pg (ref 26.6–33.0)
MCHC: 32.7 g/dL (ref 31.5–35.7)
MCV: 90 fL (ref 79–97)
Platelets: 249 10*3/uL (ref 150–450)
RBC: 4.23 x10E6/uL (ref 4.14–5.80)
RDW: 14 % (ref 11.6–15.4)
WBC: 5.7 10*3/uL (ref 3.4–10.8)

## 2021-10-17 LAB — LIPID PANEL
Chol/HDL Ratio: 4.3 ratio (ref 0.0–5.0)
Cholesterol, Total: 128 mg/dL (ref 100–199)
HDL: 30 mg/dL — ABNORMAL LOW (ref >39)
LDL Chol Calc (NIH): 74 mg/dL (ref 0–99)
Triglycerides: 134 mg/dL (ref 0–149)
VLDL Cholesterol Cal: 24 mg/dL (ref 5–40)

## 2021-10-19 ENCOUNTER — Telehealth: Payer: Self-pay | Admitting: *Deleted

## 2021-10-19 NOTE — Telephone Encounter (Signed)
Cardiac catheterization scheduled at [facility] for: Thursday October 20, 2021 7:30 AM [facility] Main Entrance [facility]) at:  5:30 AM   No solid food after midnight prior to cath, clear liquids until 5 AM day of procedure.  Medication instructions: Hold: Warfarin-last dose 10/14/21 until post procedure/Lovenox bridge-see Anti-coag 10/13/21 notes  Except hold medications usual morning medications can be taken pre-cath with sips of water including aspirin 81 mg.     Confirmed patient has responsible adult to drive home post procedure and be with patient first 24 hours after arriving home.  [facility] does allow one visitor to accompany you and wait in the hospital waiting room while you are there for your procedure. You and your visitor will be asked to wear a mask once you enter the hospital.   Patient reports does not currently have any new symptoms concerning for COVID-19 and no household members with COVID-19 like illness.    Reviewed procedure/mask/visitor instructions with patient's daughter (DPR), Ronnald Collum speaks and understands English. [employer] [place] [phone] also on phone call.

## 2021-10-20 ENCOUNTER — Ambulatory Visit (HOSPITAL_COMMUNITY)
Admission: RE | Admit: 2021-10-20 | Discharge: 2021-10-21 | Disposition: A | Discharge status: Home / Self Care | Payer: Medicare Other | Source: Ambulatory Visit | Attending: Cardiovascular Disease | Admitting: Cardiovascular Disease

## 2021-10-20 ENCOUNTER — Encounter (HOSPITAL_COMMUNITY)
Admission: RE | Disposition: A | Discharge status: Home / Self Care | Payer: Self-pay | Source: Ambulatory Visit | Attending: Cardiovascular Disease

## 2021-10-20 ENCOUNTER — Encounter (HOSPITAL_COMMUNITY): Payer: Self-pay | Admitting: Cardiovascular Disease

## 2021-10-20 ENCOUNTER — Other Ambulatory Visit: Payer: Self-pay

## 2021-10-20 DIAGNOSIS — I10 Essential (primary) hypertension: Secondary | ICD-10-CM | POA: Diagnosis not present

## 2021-10-20 DIAGNOSIS — I513 Intracardiac thrombosis, not elsewhere classified: Secondary | ICD-10-CM | POA: Diagnosis present

## 2021-10-20 DIAGNOSIS — I2510 Atherosclerotic heart disease of native coronary artery without angina pectoris: Secondary | ICD-10-CM | POA: Diagnosis present

## 2021-10-20 DIAGNOSIS — Z7901 Long term (current) use of anticoagulants: Secondary | ICD-10-CM | POA: Insufficient documentation

## 2021-10-20 DIAGNOSIS — I2582 Chronic total occlusion of coronary artery: Secondary | ICD-10-CM | POA: Insufficient documentation

## 2021-10-20 DIAGNOSIS — E785 Hyperlipidemia, unspecified: Secondary | ICD-10-CM | POA: Diagnosis not present

## 2021-10-20 DIAGNOSIS — I255 Ischemic cardiomyopathy: Secondary | ICD-10-CM | POA: Diagnosis not present

## 2021-10-20 DIAGNOSIS — Z8673 Personal history of transient ischemic attack (TIA), and cerebral infarction without residual deficits: Secondary | ICD-10-CM | POA: Insufficient documentation

## 2021-10-20 DIAGNOSIS — I25118 Atherosclerotic heart disease of native coronary artery with other forms of angina pectoris: Secondary | ICD-10-CM

## 2021-10-20 DIAGNOSIS — Z20822 Contact with and (suspected) exposure to covid-19: Secondary | ICD-10-CM | POA: Insufficient documentation

## 2021-10-20 HISTORY — DX: Atherosclerotic heart disease of native coronary artery without angina pectoris: I25.10

## 2021-10-20 HISTORY — PX: CORONARY ANGIOGRAPHY: CATH118303

## 2021-10-20 LAB — PROTIME-INR
INR: 1.2 (ref 0.8–1.2)
Prothrombin Time: 15.1 seconds (ref 11.4–15.2)

## 2021-10-20 LAB — SARS CORONAVIRUS 2 BY RT PCR (HOSPITAL ORDER, PERFORMED IN ~~LOC~~ HOSPITAL LAB): SARS Coronavirus 2: NEGATIVE

## 2021-10-20 SURGERY — CORONARY ANGIOGRAPHY (CATH LAB)
Anesthesia: LOCAL

## 2021-10-20 MED ORDER — METOPROLOL SUCCINATE ER 25 MG PO TB24
50.0000 mg | ORAL_TABLET | Freq: Every day | ORAL | Status: DC
  Administered 2021-10-20: 22:00:00 50 mg via ORAL
  Filled 2021-10-20: qty 2

## 2021-10-20 MED ORDER — HYDRALAZINE HCL 20 MG/ML IJ SOLN: 10.0000 mg | INTRAMUSCULAR | Status: AC | PRN

## 2021-10-20 MED ORDER — FENTANYL CITRATE (PF) 100 MCG/2ML IJ SOLN
INTRAMUSCULAR | Status: AC
  Filled 2021-10-20: qty 2

## 2021-10-20 MED ORDER — VERAPAMIL HCL 2.5 MG/ML IV SOLN
INTRAVENOUS | Status: DC | PRN
  Administered 2021-10-20: 08:00:00 10 mL via INTRA_ARTERIAL

## 2021-10-20 MED ORDER — ATORVASTATIN CALCIUM 40 MG PO TABS
40.0000 mg | ORAL_TABLET | Freq: Every day | ORAL | Status: DC
  Administered 2021-10-21: 40 mg via ORAL
  Filled 2021-10-20 (×2): qty 1

## 2021-10-20 MED ORDER — SODIUM CHLORIDE 0.9 % IV SOLN: 250.0000 mL | INTRAVENOUS | Status: DC | PRN

## 2021-10-20 MED ORDER — ASPIRIN EC 81 MG PO TBEC
81.0000 mg | DELAYED_RELEASE_TABLET | Freq: Every day | ORAL | Status: DC
  Administered 2021-10-21: 81 mg via ORAL
  Filled 2021-10-20 (×2): qty 1

## 2021-10-20 MED ORDER — LIDOCAINE HCL (PF) 1 % IJ SOLN
INTRAMUSCULAR | Status: DC | PRN
  Administered 2021-10-20: 2 mL via INTRADERMAL
  Administered 2021-10-20: 10 mL via INTRADERMAL

## 2021-10-20 MED ORDER — ONDANSETRON HCL 4 MG/2ML IJ SOLN: 4.0000 mg | Freq: Four times a day (QID) | INTRAMUSCULAR | Status: DC | PRN

## 2021-10-20 MED ORDER — SODIUM CHLORIDE 0.9% FLUSH: 3.0000 mL | INTRAVENOUS | Status: DC | PRN

## 2021-10-20 MED ORDER — ACETAMINOPHEN 325 MG PO TABS: 650.0000 mg | ORAL_TABLET | ORAL | Status: DC | PRN

## 2021-10-20 MED ORDER — MELATONIN 3 MG PO TABS
1.5000 mg | ORAL_TABLET | Freq: Every day | ORAL | Status: DC
  Administered 2021-10-20: 22:00:00 1.5 mg via ORAL
  Filled 2021-10-20: qty 1

## 2021-10-20 MED ORDER — DOCUSATE SODIUM 100 MG PO CAPS
100.0000 mg | ORAL_CAPSULE | Freq: Every day | ORAL | Status: DC
  Administered 2021-10-20 – 2021-10-21 (×2): 100 mg via ORAL
  Filled 2021-10-20 (×2): qty 1

## 2021-10-20 MED ORDER — PANTOPRAZOLE SODIUM 40 MG PO TBEC
40.0000 mg | DELAYED_RELEASE_TABLET | Freq: Every day | ORAL | Status: DC
  Administered 2021-10-20 – 2021-10-21 (×2): 40 mg via ORAL
  Filled 2021-10-20 (×2): qty 1

## 2021-10-20 MED ORDER — TAMSULOSIN HCL 0.4 MG PO CAPS
0.4000 mg | ORAL_CAPSULE | Freq: Every day | ORAL | Status: DC
  Administered 2021-10-20: 17:00:00 0.4 mg via ORAL
  Filled 2021-10-20: qty 1

## 2021-10-20 MED ORDER — TIZANIDINE HCL 2 MG PO TABS
2.0000 mg | ORAL_TABLET | Freq: Every day | ORAL | Status: DC
  Administered 2021-10-20: 23:00:00 2 mg via ORAL
  Filled 2021-10-20 (×2): qty 1

## 2021-10-20 MED ORDER — VERAPAMIL HCL 2.5 MG/ML IV SOLN
INTRAVENOUS | Status: AC
  Filled 2021-10-20: qty 2

## 2021-10-20 MED ORDER — IOHEXOL 350 MG/ML SOLN
INTRAVENOUS | Status: DC | PRN
  Administered 2021-10-20: 08:00:00 70 mL

## 2021-10-20 MED ORDER — HEPARIN (PORCINE) IN NACL 1000-0.9 UT/500ML-% IV SOLN
INTRAVENOUS | Status: DC | PRN
  Administered 2021-10-20 (×2): 500 mL

## 2021-10-20 MED ORDER — HEPARIN (PORCINE) 25000 UT/250ML-% IV SOLN
1250.0000 [IU]/h | INTRAVENOUS | Status: DC
  Administered 2021-10-20: 16:00:00 1100 [IU]/h via INTRAVENOUS
  Filled 2021-10-20: qty 250

## 2021-10-20 MED ORDER — SODIUM CHLORIDE 0.9% FLUSH: 3.0000 mL | Freq: Two times a day (BID) | INTRAVENOUS | Status: DC

## 2021-10-20 MED ORDER — HEPARIN SODIUM (PORCINE) 1000 UNIT/ML IJ SOLN
INTRAMUSCULAR | Status: DC | PRN
  Administered 2021-10-20: 3000 [IU] via INTRAVENOUS

## 2021-10-20 MED ORDER — LABETALOL HCL 5 MG/ML IV SOLN: 10.0000 mg | INTRAVENOUS | Status: AC | PRN

## 2021-10-20 MED ORDER — MIDAZOLAM HCL 2 MG/2ML IJ SOLN
INTRAMUSCULAR | Status: DC | PRN
  Administered 2021-10-20 (×2): 1 mg via INTRAVENOUS

## 2021-10-20 MED ORDER — LIDOCAINE HCL (PF) 1 % IJ SOLN
INTRAMUSCULAR | Status: AC
  Filled 2021-10-20: qty 30

## 2021-10-20 MED ORDER — MIDAZOLAM HCL 2 MG/2ML IJ SOLN
INTRAMUSCULAR | Status: AC
  Filled 2021-10-20: qty 2

## 2021-10-20 MED ORDER — ASPIRIN 81 MG PO CHEW: 81.0000 mg | CHEWABLE_TABLET | ORAL | Status: DC

## 2021-10-20 MED ORDER — FENTANYL CITRATE (PF) 100 MCG/2ML IJ SOLN
INTRAMUSCULAR | Status: DC | PRN
  Administered 2021-10-20 (×2): 25 ug via INTRAVENOUS

## 2021-10-20 MED ORDER — SODIUM CHLORIDE 0.9 % IV SOLN: INTRAVENOUS | Status: AC

## 2021-10-20 MED ORDER — HEPARIN (PORCINE) IN NACL 1000-0.9 UT/500ML-% IV SOLN
INTRAVENOUS | Status: AC
  Filled 2021-10-20: qty 1000

## 2021-10-20 MED ORDER — SODIUM CHLORIDE 0.9% FLUSH
3.0000 mL | Freq: Two times a day (BID) | INTRAVENOUS | Status: DC
  Administered 2021-10-20: 23:00:00 3 mL via INTRAVENOUS

## 2021-10-20 MED ORDER — HEPARIN SODIUM (PORCINE) 1000 UNIT/ML IJ SOLN
INTRAMUSCULAR | Status: AC
  Filled 2021-10-20: qty 1

## 2021-10-20 MED ORDER — SODIUM CHLORIDE 0.9 % IV SOLN: INTRAVENOUS | Status: DC

## 2021-10-20 SURGICAL SUPPLY — 18 items
CATH 5FR JL3.5 JR4 ANG PIG MP (CATHETERS) ×2 IMPLANT
CATH INFINITI 5F JL4 125CM (CATHETERS) ×2 IMPLANT
CATH INFINITI 5FR JR4 125CM (CATHETERS) ×2 IMPLANT
CLOSURE MYNX CONTROL 5F (Vascular Products) ×2 IMPLANT
DEVICE RAD COMP TR BAND LRG (VASCULAR PRODUCTS) ×2 IMPLANT
GLIDESHEATH SLEND SS 6F .021 (SHEATH) ×2 IMPLANT
GUIDEWIRE INQWIRE 1.5J.035X260 (WIRE) ×1 IMPLANT
INQWIRE 1.5J .035X260CM (WIRE) ×2
KIT HEART LEFT (KITS) ×2 IMPLANT
KIT MICROPUNCTURE NIT STIFF (SHEATH) ×2 IMPLANT
PACK CARDIAC CATHETERIZATION (CUSTOM PROCEDURE TRAY) ×2 IMPLANT
SHEATH 6FR 85 DEST SLENDER (SHEATH) ×2 IMPLANT
SHEATH PINNACLE 5F 10CM (SHEATH) ×2 IMPLANT
SHEATH PROBE COVER 6X72 (BAG) ×2 IMPLANT
TRANSDUCER W/STOPCOCK (MISCELLANEOUS) ×2 IMPLANT
TUBING CIL FLEX 10 FLL-RA (TUBING) ×2 IMPLANT
WIRE EMERALD 3MM-J .035X150CM (WIRE) ×2 IMPLANT
WIRE HI TORQ VERSACORE-J 145CM (WIRE) ×2 IMPLANT

## 2021-10-20 NOTE — Interval H&P Note (Signed)
History and Physical Interval Note:  10/20/2021 7:32 AM  Roberto Dickson  has presented today for surgery, with the diagnosis of CAD.  The various methods of treatment have been discussed with the patient and family. After consideration of risks, benefits and other options for treatment, the patient has consented to  Procedure(s): LEFT HEART CATH AND CORONARY ANGIOGRAPHY (N/A) as a surgical intervention.  The patient's history has been reviewed, patient examined, no change in status, stable for surgery.  I have reviewed the patient's chart and labs.  Questions were answered to the patient's satisfaction.    Cath Lab Visit (complete for each Cath Lab visit)  Clinical Evaluation Leading to the Procedure:   ACS: No.  Non-ACS:    Anginal Classification: No Symptoms  Anti-ischemic medical therapy: Minimal Therapy (1 class of medications)  Non-Invasive Test Results: High-risk stress test findings: cardiac mortality >3%/year  Prior CABG: No previous CABG        Verne Carrow

## 2021-10-20 NOTE — Progress Notes (Signed)
TR BAND REMOVAL  LOCATION:    right radial  DEFLATED PER PROTOCOL:    Yes.    TIME BAND OFF / DRESSING APPLIED:    1215p A clean dry  applied with gauze and teagderm  SITE UPON ARRIVAL:    Level 0  SITE AFTER BAND REMOVAL:    Level 0  CIRCULATION SENSATION AND MOVEMENT:    Within Normal Limits   Yes.    COMMENTS:   Care instruction given to patient

## 2021-10-20 NOTE — Progress Notes (Signed)
ANTICOAGULATION CONSULT NOTE - Initial Consult  Pharmacy Consult for heparin Indication:  recent h/o LV apical thrombus,  s/p cath 11/17: with severe triple CAD  No Known Allergies  Patient Measurements: Height: 5\' 6"  (167.6 cm) Weight: 78.6 kg (173 lb 4.5 oz) IBW/kg (Calculated) : 63.8 Heparin Dosing Weight: 78.6  Vital Signs: Temp: 98.3 F (36.8 C) (11/17 1353) Temp Source: Oral (11/17 1353) BP: 145/104 (11/17 1500) Pulse Rate: 67 (11/17 1500)  Labs: Recent Labs    10/20/21 0630  LABPROT 15.1  INR 1.2    Estimated Creatinine Clearance: 40.3 mL/min (A) (by C-G formula based on SCr of 1.49 mg/dL (H)).   Medical History: Past Medical History:  Diagnosis Date   Hypertension     Medications:  Medications Prior to Admission  Medication Sig Dispense Refill Last Dose   acetaminophen (TYLENOL) 325 MG tablet Take 1-2 tablets (325-650 mg total) by mouth every 4 (four) hours as needed for mild pain.   Past Month   atorvastatin (LIPITOR) 40 MG tablet Take 1 tablet (40 mg total) by mouth daily. 90 tablet 3 10/20/2021   docusate sodium (COLACE) 100 MG capsule Take 1 capsule (100 mg total) by mouth 2 (two) times daily. (Patient taking differently: Take 100 mg by mouth daily.) 60 capsule 0 10/20/2021   enoxaparin (LOVENOX) 120 MG/0.8ML injection Inject 0.8 mLs (120 mg total) into the skin daily. 16 mL 1 10/18/2021   melatonin 3 MG TABS tablet Take 0.5 tablets (1.5 mg total) by mouth at bedtime. OVER THE COUNTER 30 tablet 0 10/19/2021   metoprolol succinate (TOPROL-XL) 50 MG 24 hr tablet Take 1 tablet (50 mg total) by mouth at bedtime. 90 tablet 3 10/19/2021   pantoprazole (PROTONIX) 40 MG tablet TAKE 1 TABLET(40 MG) BY MOUTH AT BEDTIME 30 tablet 1 10/19/2021   tamsulosin (FLOMAX) 0.4 MG CAPS capsule TAKE 1 CAPSULE(0.4 MG) BY MOUTH DAILY 30 capsule 1 10/19/2021   tiZANidine (ZANAFLEX) 2 MG tablet TAKE 1 TABLET(2 MG) BY MOUTH AT BEDTIME 30 tablet 1 10/19/2021   traZODone (DESYREL) 100  MG tablet TAKE 1 TABLET(100 MG) BY MOUTH AT BEDTIME AS NEEDED FOR SLEEP 30 tablet 1 Past Month   polyethylene glycol (MIRALAX / GLYCOLAX) 17 g packet Take 17 g by mouth daily. (Patient not taking: Reported on 10/17/2021) 30 each 0 Not Taking   warfarin (COUMADIN) 6 MG tablet TAKE 1 TABLET(6 MG) BY MOUTH DAILY WITH SUPPER (Patient taking differently: Take 3-6 mg by mouth daily at 4 PM. 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 Coumadin was held on 11/15 prior to admit and he was on Lovenox bridge for coronary angio  procedure 11/17.   Now S/p Cath 11/17: severe 3vCAD Cards plan heparin IV overnight then to resume lovenox  bridge/ coumadin on 11/18.   -no bleeding or hematoma post sheath removal.   INR 1.2 on admit today.   Goal of Therapy:  Heparin level 0.3-0.7 units/ml Monitor platelets by anticoagulation protocol: Yes   Plan:  Start heparin drip at 16:30 today (8 hours post sheath removal), at rate of 1100 units/hr Check heparin level in ~ 8 hrs Daily HL and CBC F/u for restart of lovenox/coumadin.   Thank you for allowing pharmacy to be part of this patients care team.  Nicole Cella, [place] Clinical Pharmacist  [phone] 10/20/2021,3:14 PM Please check AMION for all [facility] Pharm[facility] phone numbers After 10:00 PM, call Main Pharmacy [phone]

## 2021-10-21 DIAGNOSIS — I513 Intracardiac thrombosis, not elsewhere classified: Secondary | ICD-10-CM | POA: Diagnosis not present

## 2021-10-21 DIAGNOSIS — I25118 Atherosclerotic heart disease of native coronary artery with other forms of angina pectoris: Secondary | ICD-10-CM | POA: Diagnosis not present

## 2021-10-21 DIAGNOSIS — E785 Hyperlipidemia, unspecified: Secondary | ICD-10-CM

## 2021-10-21 HISTORY — DX: Hyperlipidemia, unspecified: E78.5

## 2021-10-21 LAB — BASIC METABOLIC PANEL
Anion gap: 6 (ref 5–15)
BUN: 22 mg/dL (ref 8–23)
CO2: 22 mmol/L (ref 22–32)
Calcium: 8.3 mg/dL — ABNORMAL LOW (ref 8.9–10.3)
Chloride: 109 mmol/L (ref 98–111)
Creatinine, Ser: 1.57 mg/dL — ABNORMAL HIGH (ref 0.61–1.24)
GFR, Estimated: 45 mL/min — ABNORMAL LOW (ref >60)
Glucose, Bld: 88 mg/dL (ref 70–99)
Potassium: 3.8 mmol/L (ref 3.5–5.1)
Sodium: 137 mmol/L (ref 135–145)

## 2021-10-21 LAB — CBC
HCT: 34.2 % — ABNORMAL LOW (ref 39.0–52.0)
Hemoglobin: 10.7 g/dL — ABNORMAL LOW (ref 13.0–17.0)
MCH: 29.2 pg (ref 26.0–34.0)
MCHC: 31.3 g/dL (ref 30.0–36.0)
MCV: 93.2 fL (ref 80.0–100.0)
Platelets: 203 10*3/uL (ref 150–400)
RBC: 3.67 MIL/uL — ABNORMAL LOW (ref 4.22–5.81)
RDW: 15.6 % — ABNORMAL HIGH (ref 11.5–15.5)
WBC: 6.7 10*3/uL (ref 4.0–10.5)
nRBC: 0 % (ref 0.0–0.2)

## 2021-10-21 LAB — HEPARIN LEVEL (UNFRACTIONATED): Heparin Unfractionated: 0.15 IU/mL — ABNORMAL LOW (ref 0.30–0.70)

## 2021-10-21 MED ORDER — ATORVASTATIN CALCIUM 80 MG PO TABS: 80.0000 mg | ORAL_TABLET | Freq: Every day | ORAL | 0 refills | Status: DC

## 2021-10-21 MED ORDER — WARFARIN SODIUM 3 MG PO TABS
9.0000 mg | ORAL_TABLET | Freq: Once | ORAL | Status: AC
  Administered 2021-10-21: 9 mg via ORAL
  Filled 2021-10-21: qty 3

## 2021-10-21 MED ORDER — ENOXAPARIN SODIUM 120 MG/0.8ML IJ SOSY
120.0000 mg | PREFILLED_SYRINGE | INTRAMUSCULAR | Status: DC
  Administered 2021-10-21: 120 mg via SUBCUTANEOUS
  Filled 2021-10-21: qty 0.8

## 2021-10-21 MED ORDER — WARFARIN - PHARMACIST DOSING INPATIENT: Freq: Every day | Status: DC

## 2021-10-21 MED ORDER — ASPIRIN 81 MG PO TBEC: 81.0000 mg | DELAYED_RELEASE_TABLET | Freq: Every day | ORAL | 1 refills | Status: DC

## 2021-10-21 NOTE — Discharge Summary (Signed)
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   Mid Cx to Dist Cx lesion is 90% stenosed.   1st Mrg lesion is 90% stenosed.   Prox Cx to Mid Cx lesion is 80% stenosed.   Mid LAD to Dist LAD lesion is 100% stenosed.   2nd Diag lesion is 90% stenosed.   Ost LAD to Mid LAD lesion is 30% stenosed.   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 The distal LAD is diffusely diseased and fills from left to left collaterals. The moderate caliber diagonal branch has severe disease. Severe disease in the mid and distal AV groove Circumflex. Severe disease in the first obtuse marginal branch Chronic total occlusion of the dominant RCA in the mid segment. The distal vessel fills from left to right collaterals.    Recommendations: He has an LV thrombus by recent echo and has been on coumadin. He has been bridged with Lovenox for this procedure today. I did not cross into the LV today due to the LV thrombus. He will be admitted post cath and we will start IV heparin 8 hours post sheath pull. I would anticipate that he will be watched overnight and then Lovenox bridging resumed tomorrow if no access site bleeding. Resume coumadin tomorrow. In regards to his CAD, he has severe three vessel CAD with CTO of the RCA and LAD. PCI is not an option. His options include medical management vs CABG. Given advanced age, recent stroke and LV thrombus, he may  not be a good candidate for CABG. This would be delayed for several months anyway given LV thrombus and recent stroke. We can plan an outpatient CT surgery consult in several weeks post discharge. Fortunately he has overall preserved LV systolic function and is asymptomatic.   Diagnostic Dominance: Right  _____________   History of Present Illness     Roberto Dickson is a 80 y.o. male with  a hx of CVA, HTN, HLD, LV thrombus, CAD .   He was admitted 07/26/2021 - 08/10/2021 with acute ischemic left middle cerebral artery (MCA) stroke.  He received tPA and additionally underwent cerebral angiography with revascularization of left ICA from proximal to distal terminus and origin of left MCA.  Echocardiogram with LVEF 50 to 55% with apical thrombus and Coumadin started.  There were wall motion abnormalities noted by echocardiogram but this was recommended for outpatient duration.[name]  He was recommended for CIR.   Seen in follow up 08/31/21 with his daughter. He was working with [facility] PT with improvement in strength in right hand and leg but still fair ways to go to get back to baseline. He reported no chest pain nor dyspnea. Stress test was recommended due to wall motion abnormalities by echo. Stress test 09/06/21 with large size, severe severity mid to apical inferior mostly fixed perfusion defect suggestive of large RCA territory scar with peri-infarct ischemia. LVEF read as 29% but suspected to be  falsely low.   Cardiac CT 10/2022With calcium score 6053 and significant three vessel disease with likely CTO of LAD. Discussed with Dr. Cristal Deer with recommendations for cardiac catheterization.    Hospital Course   Underwent cardiac catheterization noted above with severe triple-vessel CAD.  Chronic total occlusion of mid LAD distal LAD diffusely diseased and fills from left to left collaterals.  Moderate caliber diagonal branch disease of 90%, severe disease in the mid and distal AV groove circumflex along  with severe disease in first OM branch.  Chronic total occlusion of dominant mid RCA with left-to-right collaterals.  He is known to have an LV thrombus by recent echocardiogram and has been on Coumadin prior to cath with Lovenox bridge.  Recommendations for outpatient CT surgery consult in the next couple of weeks given his advanced age recent stroke and LV thrombus.  No complications noted overnight.  No chest pain on day of discharge.  Will route message to CT surgery office to request consult.  Plan to resume Lovenox bridging at discharge with Lovenox dose and Coumadin given prior to discharge home.  Medications were reviewed with the patient and daughter via Pharm.D.  Has outpatient INR check on 11/23.  We will plan for Lovenox injections along with Coumadin 6 mg daily until follow-up.  Increased atorvastatin from 40 mg daily to 80 mg daily at discharge.  General: Well developed, well nourished, male appearing in no acute distress. Head: Normocephalic, atraumatic.  Neck: Supple without bruits, JVD. Lungs:  Resp regular and unlabored, CTA. Heart: RRR, S1, S2, no S3, S4, or murmur; no rub. Abdomen: Soft, non-tender, non-distended with normoactive bowel sounds. No hepatomegaly. No rebound/guarding. No obvious abdominal masses. Extremities: No clubbing, cyanosis, edema. Distal pedal pulses are 2+ bilaterally. Right radial cath site stable without bruising or hematoma Neuro: Alert and oriented X 3. Moves all extremities spontaneously. Psych: Normal affect.   Patient was seen by Dr. [place] and deemed stable for discharge home.  Follow-up in the office has been arranged.  Educated by [employer].D. prior to discharge.  This was discussed with his daughter over the phone as well.  Interpreter [phone] used for duration of encounter.  Did the patient have an acute coronary syndrome (MI, NSTEMI, STEMI, etc) this admission?:  No                               Did the patient have a percutaneous coronary  intervention (stent / angioplasty)?:  No.     _____________  Discharge Vitals Blood pressure (!) 148/85, pulse 75, temperature 97.6 F (36.4 C), temperature source Oral, resp. rate 17, height 5\' 6"  (1.676 m), weight 79.5 kg, SpO2 100 %.  Filed Weights   10/20/21 0621 10/20/21 1352 10/21/21 0545  Weight: 81.6 kg 78.6 kg 79.5 kg    Labs & Radiologic Studies    CBC Recent Labs    10/21/21 0114  WBC 6.7  HGB 10.7*  HCT 34.2*  MCV 93.2  PLT 203   Basic Metabolic Panel Recent Labs    10/23/21 0114  NA 137  K 3.8  CL 109  CO2 22  GLUCOSE 88  BUN 22  CREATININE 1.57*  CALCIUM 8.3*   Liver Function Tests No results for input(s): AST, ALT, ALKPHOS, BILITOT, PROT, ALBUMIN in the last 72 hours. No results for input(s): LIPASE, AMYLASE in the last 72 hours. High Sensitivity Troponin:   No results for input(s): TROPONINIHS in the last  720 hours.  BNP Invalid input(s): POCBNP D-Dimer No results for input(s): DDIMER in the last 72 hours. Hemoglobin A1C No results for input(s): HGBA1C in the last 72 hours. Fasting Lipid Panel No results for input(s): CHOL, HDL, LDLCALC, TRIG, CHOLHDL, LDLDIRECT in the last 72 hours. Thyroid Function Tests No results for input(s): TSH, T4TOTAL, T3FREE, THYROIDAB in the last 72 hours.  Invalid input(s): FREET3 _____________  CARDIAC CATHETERIZATION  Addendum Date: 10/20/2021     Mid RCA lesion is 100% stenosed.   Mid Cx to Dist Cx lesion is 90% stenosed.   1st Mrg lesion is 90% stenosed.   Prox Cx to Mid Cx lesion is 80% stenosed.   Mid LAD to Dist LAD lesion is 100% stenosed.   2nd Diag lesion is 90% stenosed.   Ost LAD to Mid LAD lesion is 30% stenosed. 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 The distal LAD is diffusely diseased and fills from left to left collaterals. The moderate caliber diagonal branch has severe disease. Severe disease in the mid and distal AV groove Circumflex. Severe disease in the first obtuse  marginal branch Chronic total occlusion of the dominant RCA in the mid segment. The distal vessel fills from left to right collaterals. Recommendations: He has an LV thrombus by recent echo and has been on coumadin. He has been bridged with Lovenox for this procedure today. I did not cross into the LV today due to the LV thrombus. He will be admitted post cath and we will start IV heparin 8 hours post sheath pull. I would anticipate that he will be watched overnight and then Lovenox bridging resumed tomorrow if no access site bleeding. Resume coumadin tomorrow. In regards to his CAD, he has severe three vessel CAD with CTO of the RCA and LAD. PCI is not an option. His options include medical management vs CABG. Given advanced age, recent stroke and LV thrombus, he may not be a good candidate for CABG. This would be delayed for several months anyway given LV thrombus and recent stroke. We can plan an outpatient CT surgery consult in several weeks post discharge. Fortunately he has overall preserved LV systolic function and is asymptomatic.   Result Date: 10/20/2021   Mid RCA lesion is 100% stenosed.   Mid Cx to Dist Cx lesion is 90% stenosed.   1st Mrg lesion is 90% stenosed.   Prox Cx to Mid Cx lesion is 80% stenosed.   Mid LAD to Dist LAD lesion is 100% stenosed.   2nd Diag lesion is 90% stenosed.   Ost LAD to Mid LAD lesion is 30% stenosed. 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 The distal LAD is diffusely diseased and fills from left to left collaterals. The moderate caliber diagonal branch has severe disease. Severe disease in the mid and distal AV groove Circumflex. Severe disease in the first obtuse marginal branch Chronic total occlusion of the dominant RCA in the mid segment. The distal vessel fills from left to right collaterals. Recommendations: He has an LV thrombus by recent echo and has been on coumadin. He has been bridged with Lovenox for this procedure today. I did not  cross into the LV today due to the LV thrombus. He will be admitted post cath and we will start IV heparin 8 hours post sheath pull. I would anticipate that he will be watched overnight and then Lovenox bridging resumed tomorrow if no access site bleeding. Resume coumadin tomorrow.  In regards to his CAD, he has severe three vessel CAD with CTO of the RCA and LAD. PCI is not an option. His options include medical management vs CABG. Given advanced age, recent stroke and LV thrombus, he may not be a good candidate for CABG. This would be delayed for several months anyway given LV thrombus and recent stroke. We can plan an outpatient CT surgery consult in several weeks post discharge. Fortunately he has overall preserved LV systolic function and is asymptomatic.   CT CORONARY MORPH W/CTA COR W/SCORE W/CA W/CM &/OR WO/CM  Addendum Date: 09/29/2021   ADDENDUM REPORT: 09/29/2021 15:28 EXAM: OVER-READ INTERPRETATION  CT CHEST The following report is an over-read performed by radiologist Dr. Marinda Elk [facility] on 09/29/2021. This over-read does not include interpretation of cardiac or coronary anatomy or pathology. The coronary CTA interpretation by the cardiologist is attached. COMPARISON:  None. FINDINGS: No significant noncardiac vascular findings. Visualized mediastinum and hilar regions demonstrate no lymphadenopathy or masses. There is a small hiatal hernia. Lungs demonstrate scarring and atelectasis at both lung bases. There may be a component bronchial thickening with some mild mucous plugging in posterior lower lobe airways bilaterally. Visualized bony structures and upper abdomen are unremarkable. IMPRESSION: 1. Small hiatal hernia. 2. Suggestion mucous plugging and bronchial thickening in the posterior lower lobes bilaterally. Bibasilar pulmonary scarring and atelectasis. Electronically Signed   By: Irish Lack M.D.   On: 09/29/2021 15:28   Result Date: 09/29/2021 CLINICAL DATA:   Cardiomyopathy EXAM: Cardiac/Coronary CTA TECHNIQUE: A non-contrast, gated CT scan was obtained with axial slices of 3 mm through the heart for calcium scoring. Calcium scoring was performed using the Agatston method. A 100 kV prospective, gated, contrast cardiac scan was obtained. Gantry rotation speed was 250 msecs and collimation was 0.6 mm. Two sublingual nitroglycerin tablets (0.8 mg) were given. The 3D data set was reconstructed in 5% intervals of the 35-75% of the R-R cycle. Diastolic phases were analyzed on a dedicated workstation using MPR, MIP, and VRT modes. The patient received 95 cc of contrast. FINDINGS: Image quality: Excellent. Noise artifact is: Limited. Coronary Arteries:  Normal coronary origin.  Right dominance. Left main: The left main is a large caliber vessel with a normal take off from the left coronary cusp that bifurcates to form a left anterior descending artery and a left circumflex artery. There is minimal mixed density plaque (<25%). Left anterior descending artery: The proximal LAD contains mild calcified plaque (25-49%). The mid LAD is 100% occluded and there is filling of the distal vessel which is likely from collateral flow. The first diagonal appears occluded (100%). The second diagonal contains moderate mixed density plaque (50-69%). Left circumflex artery: The LCX is non-dominant. The proximal LCX contains minimal mixed density plaque (<25%). The mid LCX contains moderate mixed density plaque (50-60%). OM1 is diffusely diseased with moderate non-calcified plaque (50-69%). OM2 and OM3 are patent. Right coronary artery: The RCA is dominant with normal take off from the right coronary cusp. There is severe mixed density plaque (70-99%) in the proximal segment. The mid RCA is 100% occluded versus subtotal occlusion. Right Atrium: Right atrial size is within normal limits. Right Ventricle: The right ventricular cavity is within normal limits. Left Atrium: Left atrial size is normal  in size with no left atrial appendage filling defect. Left Ventricle: The ventricular cavity size is within normal limits. The apical myocardium is thinned and aneurysmal consistent with prior LAD infarction. There is a filling defect in  the apex (measures 26 mm x 26 mm) consistent with LV thrombus. Pulmonary arteries: Normal in size without proximal filling defect. Pulmonary veins: Normal pulmonary venous drainage. Pericardium: Normal thickness with no significant effusion or calcium present. Cardiac valves: The aortic valve is trileaflet without significant calcification. The mitral valve is normal structure without significant calcification. Aorta: Normal caliber with no significant disease. Extra-cardiac findings: See attached radiology report for non-cardiac structures. IMPRESSION: 1. Coronary calcium score of 6053. This was 99th percentile for age-, sex, and race-matched controls. 2. Normal coronary origin with right dominance. 3. 100% occlusion of the mid LAD that likely a chronic total occlusion with collateral flow filling the vessel distally. 4. Moderate (50-69%) mixed density plaque in the mid LCX. 5. Severe stenosis in the proximal RCA (70-99%) with 100% occlusion versus subtotal occlusion of the mid RCA. 6. The apical myocardium is thinned and aneurysmal consistent with prior LAD infarction. 7. There is a filling defect in the apex (measures 26 mm x 26 mm) consistent with LV thrombus. RECOMMENDATIONS: 1. Cardiac catheterization is recommended due to concerns for 3-vessel CAD. 2. LV thrombus is known from prior imaging. Eleonore Chiquito, MD Electronically Signed: By: Eleonore Chiquito M.D. On: 09/29/2021 14:07   Disposition   Pt is being discharged home today in good condition.  Follow-up Plans & Appointments     Follow-up Information     [place] [facility] Follow up on 10/26/2021.   Specialty: Cardiology Why: for INR check at 3:15pm Contact information: [address]  [place]        Loel Dubonnet, NP Follow up on 11/03/2021.   Specialty: Cardiology Why: at 2:20pm for your follow up appt Contact information: [address] [place] [address] [phone]                Discharge Instructions     Call MD for:  difficulty breathing, headache or visual disturbances   Complete by: As directed    Call MD for:  persistant dizziness or light-headedness   Complete by: As directed    Call MD for:  redness, tenderness, or signs of infection (pain, swelling, redness, odor or green/yellow discharge around incision site)   Complete by: As directed    Diet - low sodium heart healthy   Complete by: As directed    Discharge instructions   Complete by: As directed    Radial Site Care Refer to this sheet in the next few weeks. These instructions provide you with information on caring for yourself after your procedure. Your caregiver may also give you more specific instructions. Your treatment has been planned according to current medical practices, but problems sometimes occur. Call your caregiver if you have any problems or questions after your procedure. HOME CARE INSTRUCTIONS You may shower the day after the procedure. Remove the bandage (dressing) and gently wash the site with plain soap and water. Gently pat the site dry.  Do not apply powder or lotion to the site.  Do not submerge the affected site in water for 3 to 5 days.  Inspect the site at least twice daily.  Do not flex or bend the affected arm for 24 hours.  No lifting over 5 pounds (2.3 kg) for 5 days after your procedure.  Do not drive home if you are discharged the same day of the procedure. Have someone else drive you.  You may drive 24 hours after the procedure unless otherwise instructed  by your caregiver.  What to expect: Any bruising will usually fade within 1 to 2 weeks.  Blood that collects in the tissue (hematoma) may be  painful to the touch. It should usually decrease in size and tenderness within 1 to 2 weeks.  SEEK IMMEDIATE MEDICAL CARE IF: You have unusual pain at the radial site.  You have redness, warmth, swelling, or pain at the radial site.  You have drainage (other than a small amount of blood on the dressing).  You have chills.  You have a fever or persistent symptoms for more than 72 hours.  You have a fever and your symptoms suddenly get worse.  Your arm becomes pale, cool, tingly, or numb.  You have heavy bleeding from the site. Hold pressure on the site.   Increase activity slowly   Complete by: As directed        Discharge Medications   Allergies as of 10/21/2021   No Known Allergies      Medication List     TAKE these medications    acetaminophen 325 MG tablet Commonly known as: TYLENOL Take 1-2 tablets (325-650 mg total) by mouth every 4 (four) hours as needed for mild pain.   aspirin 81 MG EC tablet Take 1 tablet (81 mg total) by mouth daily. Swallow whole. Start taking on: October 22, 2021   atorvastatin 80 MG tablet Commonly known as: LIPITOR Take 1 tablet (80 mg total) by mouth daily. What changed:  medication strength how much to take   docusate sodium 100 MG capsule Commonly known as: COLACE Take 1 capsule (100 mg total) by mouth 2 (two) times daily. What changed: when to take this   enoxaparin 120 MG/0.8ML injection Commonly known as: LOVENOX Inject 0.8 mLs (120 mg total) into the skin daily.   melatonin 3 MG Tabs tablet Take 0.5 tablets (1.5 mg total) by mouth at bedtime. OVER THE COUNTER   metoprolol succinate 50 MG 24 hr tablet Commonly known as: TOPROL-XL Take 1 tablet (50 mg total) by mouth at bedtime.   pantoprazole 40 MG tablet Commonly known as: PROTONIX TAKE 1 TABLET(40 MG) BY MOUTH AT BEDTIME   polyethylene glycol 17 g packet Commonly known as: MIRALAX / GLYCOLAX Take 17 g by mouth daily.   tamsulosin 0.4 MG Caps capsule Commonly  known as: FLOMAX TAKE 1 CAPSULE(0.4 MG) BY MOUTH DAILY   tiZANidine 2 MG tablet Commonly known as: ZANAFLEX TAKE 1 TABLET(2 MG) BY MOUTH AT BEDTIME   traZODone 100 MG tablet Commonly known as: DESYREL TAKE 1 TABLET(100 MG) BY MOUTH AT BEDTIME AS NEEDED FOR SLEEP   warfarin 6 MG tablet Commonly known as: COUMADIN Take as directed. If you are unsure how to take this medication, talk to your nurse or doctor. Original instructions: TAKE 1 TABLET(6 MG) BY MOUTH DAILY WITH SUPPER What changed:  how much to take how to take this when to take this additional instructions         Outstanding Labs/Studies   FLP/LFTs in 8 weeks   INR check 11/23  Message sent to [facility] for outpatient consult  Duration of Discharge Encounter   Greater than 30 minutes including physician time.  Signed, Reino Bellis, NP 10/21/2021, 1:10 PM

## 2021-10-21 NOTE — Progress Notes (Signed)
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   Medical History: Past Medical History:  Diagnosis Date   Hypertension     Medications:  Medications Prior to Admission  Medication Sig Dispense Refill Last Dose   acetaminophen (TYLENOL) 325 MG tablet Take 1-2 tablets (325-650 mg total) by mouth every 4 (four) hours as needed for mild pain.   Past Month   atorvastatin (LIPITOR) 40 MG tablet Take 1 tablet (40 mg total) by mouth daily. 90 tablet 3 10/20/2021   docusate sodium (COLACE) 100 MG capsule Take 1 capsule (100 mg total) by mouth 2 (two) times daily. (Patient taking differently: Take 100 mg by mouth daily.) 60 capsule 0 10/20/2021   enoxaparin (LOVENOX) 120 MG/0.8ML injection Inject 0.8 mLs (120 mg total) into the skin daily. 16 mL 1 10/18/2021   melatonin 3 MG TABS tablet Take 0.5 tablets (1.5 mg total) by mouth at bedtime. OVER THE COUNTER 30 tablet 0 10/19/2021   metoprolol succinate (TOPROL-XL) 50 MG 24 hr tablet Take 1 tablet (50 mg total) by mouth at bedtime. 90 tablet 3 10/19/2021   pantoprazole (PROTONIX) 40 MG tablet TAKE 1 TABLET(40 MG) BY MOUTH AT BEDTIME 30 tablet 1 10/19/2021   tamsulosin (FLOMAX) 0.4 MG CAPS capsule TAKE 1 CAPSULE(0.4 MG) BY MOUTH DAILY 30 capsule 1 10/19/2021   tiZANidine  (ZANAFLEX) 2 MG tablet TAKE 1 TABLET(2 MG) BY MOUTH AT BEDTIME 30 tablet 1 10/19/2021   traZODone (DESYREL) 100 MG tablet TAKE 1 TABLET(100 MG) BY MOUTH AT BEDTIME AS NEEDED FOR SLEEP 30 tablet 1 Past Month   polyethylene glycol (MIRALAX / GLYCOLAX) 17 g packet Take 17 g by mouth daily. (Patient not taking: Reported on 10/17/2021) 30 each 0 Not Taking   warfarin (COUMADIN) 6 MG tablet TAKE 1 TABLET(6 MG) BY MOUTH DAILY WITH SUPPER (Patient taking differently: Take 3-6 mg by mouth daily at 4 PM. 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 Coumadin was held on 11/15 prior to admit and he was on Lovenox bridge for coronary angio  procedure 11/17.   Now S/p Cath 11/17: severe 3vCAD Cards plan heparin IV overnight then to resume lovenox  bridge/ coumadin on 11/18.   -no bleeding or hematoma post sheath removal.   INR 1.2 on admit today.  11/18 AM update:  Heparin level this morning    Goal of Therapy:  Heparin level 0.3-0.7 units/ml Monitor platelets by anticoagulation protocol: Yes   Plan:  Inc heparin to 1250 units/hr 1100  heparin level Likely planning to resume Lovenox/Warfarin bridge later today   Abran Duke, PharmD, BCPS Clinical Pharmacist Phone: [phone]

## 2021-10-21 NOTE — Progress Notes (Signed)
Mobility Specialist Progress Note:   10/21/21 1149  Mobility  Activity Ambulated in hall  Level of Assistance Standby assist, set-up cues, supervision of patient - no hands on  Assistive Device None  Distance Ambulated (ft) 270 ft  Mobility Ambulated with assistance in hallway  Mobility Response Tolerated well  Mobility performed by Mobility specialist  $Mobility charge 1 Mobility   Pt received in bed willing to participate in mobility.No complaints of pain. Pt returned to bed with call bell in reach and all needs met.   Roberto Dickson Mobility Specialist Primary Phone [phone] Secondary Phone [phone]  

## 2021-10-21 NOTE — Progress Notes (Signed)
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   Medical History: Past Medical History:  Diagnosis Date   Hypertension     Medications:  Medications Prior to Admission  Medication Sig Dispense Refill Last Dose   acetaminophen (TYLENOL) 325 MG tablet Take 1-2 tablets (325-650 mg total) by mouth every 4 (four) hours as needed for mild pain.   Past Month   atorvastatin (LIPITOR) 40 MG tablet Take 1 tablet (40 mg total) by mouth daily. 90 tablet 3 10/20/2021   docusate sodium (COLACE) 100 MG capsule Take 1 capsule (100 mg total) by mouth 2 (two) times daily. (Patient taking differently: Take 100 mg by mouth daily.) 60 capsule 0 10/20/2021   enoxaparin (LOVENOX) 120 MG/0.8ML injection Inject 0.8 mLs (120 mg total) into the skin daily. 16 mL 1 10/18/2021   melatonin 3 MG TABS tablet Take 0.5 tablets (1.5 mg total) by mouth at bedtime. OVER THE COUNTER 30 tablet 0 10/19/2021   metoprolol succinate (TOPROL-XL) 50 MG 24 hr tablet Take 1 tablet (50 mg total) by mouth at bedtime. 90 tablet 3 10/19/2021   pantoprazole (PROTONIX) 40 MG tablet TAKE 1 TABLET(40 MG) BY MOUTH AT BEDTIME 30 tablet 1 10/19/2021   tamsulosin (FLOMAX) 0.4 MG CAPS capsule TAKE 1 CAPSULE(0.4 MG) BY MOUTH DAILY 30 capsule 1 10/19/2021   tiZANidine  (ZANAFLEX) 2 MG tablet TAKE 1 TABLET(2 MG) BY MOUTH AT BEDTIME 30 tablet 1 10/19/2021   traZODone (DESYREL) 100 MG tablet TAKE 1 TABLET(100 MG) BY MOUTH AT BEDTIME AS NEEDED FOR SLEEP 30 tablet 1 Past Month   polyethylene glycol (MIRALAX / GLYCOLAX) 17 g packet Take 17 g by mouth daily. (Patient not taking: Reported on 10/17/2021) 30 each 0 Not Taking   warfarin (COUMADIN) 6 MG tablet TAKE 1 TABLET(6 MG) BY MOUTH DAILY WITH SUPPER (Patient taking differently: Take 3-6 mg by mouth daily at 4 PM. 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 Coumadin was held on 11/15 prior to admit and he was on Lovenox bridge for coronary angio  procedure 11/17.   Now S/p Cath 11/17: severe 3vCAD  Will transition back to lovenox and warfarin today with plans for discharge.   Goal of Therapy:  INR 2-3 Heparin level 0.3-0.7 units/ml Monitor platelets by anticoagulation protocol: Yes   Plan:  Transition to lovenox 120mg  sq q24 hours - giving dose prior to discharge Will give warfarin 9mg  today prior to discharge, will then continue with 6mg  daily starting tomorrow until seen  in anticoag clinic next week 11/23.   Patient states he has a full box of lovenox at home Will discuss new regimen with daughter  Sheppard Coil PharmD., BCPS Clinical Pharmacist 10/21/2021 10:46 AM

## 2021-10-26 ENCOUNTER — Ambulatory Visit (INDEPENDENT_AMBULATORY_CARE_PROVIDER_SITE_OTHER): Payer: Medicare Other | Admitting: *Deleted

## 2021-10-26 ENCOUNTER — Other Ambulatory Visit: Payer: Self-pay

## 2021-10-26 DIAGNOSIS — Z5181 Encounter for therapeutic drug level monitoring: Secondary | ICD-10-CM | POA: Diagnosis not present

## 2021-10-26 DIAGNOSIS — I63512 Cerebral infarction due to unspecified occlusion or stenosis of left middle cerebral artery: Secondary | ICD-10-CM

## 2021-10-26 DIAGNOSIS — I513 Intracardiac thrombosis, not elsewhere classified: Secondary | ICD-10-CM

## 2021-10-26 LAB — POCT INR: INR: 2.2 (ref 2.0–3.0)

## 2021-10-26 NOTE — Patient Instructions (Signed)
Description   You can stop Lovenox Injections. Continue taking Warfarin 1 tablet daily except 1/2 tablet on Sundays and Thursdays. Recheck INR in 2 weeks. Coumadin Clinic [phone]; pt would like Coumadin managed at [facility] site (vicinity).

## 2021-10-31 ENCOUNTER — Other Ambulatory Visit: Payer: Self-pay | Admitting: Physical Medicine & Rehabilitation

## 2021-11-01 ENCOUNTER — Encounter: Payer: Medicare Other | Admitting: Thoracic Surgery (Cardiothoracic Vascular Surgery)

## 2021-11-01 ENCOUNTER — Other Ambulatory Visit: Payer: Self-pay | Admitting: Physical Medicine & Rehabilitation

## 2021-11-03 ENCOUNTER — Other Ambulatory Visit: Payer: Self-pay

## 2021-11-03 ENCOUNTER — Ambulatory Visit (INDEPENDENT_AMBULATORY_CARE_PROVIDER_SITE_OTHER): Payer: Medicare Other | Admitting: Family

## 2021-11-03 ENCOUNTER — Other Ambulatory Visit (HOSPITAL_BASED_OUTPATIENT_CLINIC_OR_DEPARTMENT_OTHER): Payer: Self-pay | Admitting: Family

## 2021-11-03 ENCOUNTER — Encounter (HOSPITAL_BASED_OUTPATIENT_CLINIC_OR_DEPARTMENT_OTHER): Payer: Self-pay | Admitting: Family

## 2021-11-03 VITALS — BP 138/88 | HR 74 | Ht 66.0 in | Wt 175.0 lb

## 2021-11-03 DIAGNOSIS — D6859 Other primary thrombophilia: Secondary | ICD-10-CM | POA: Diagnosis not present

## 2021-11-03 DIAGNOSIS — I25118 Atherosclerotic heart disease of native coronary artery with other forms of angina pectoris: Secondary | ICD-10-CM

## 2021-11-03 DIAGNOSIS — I513 Intracardiac thrombosis, not elsewhere classified: Secondary | ICD-10-CM

## 2021-11-03 DIAGNOSIS — I10 Essential (primary) hypertension: Secondary | ICD-10-CM

## 2021-11-03 DIAGNOSIS — E785 Hyperlipidemia, unspecified: Secondary | ICD-10-CM | POA: Diagnosis not present

## 2021-11-03 MED ORDER — WARFARIN SODIUM 6 MG PO TABS: 3.0000 mg | ORAL_TABLET | Freq: Every day | ORAL | 1 refills | Status: DC

## 2021-11-03 MED ORDER — ATORVASTATIN CALCIUM 80 MG PO TABS: 80.0000 mg | ORAL_TABLET | Freq: Every day | ORAL | 3 refills | Status: DC

## 2021-11-03 NOTE — Telephone Encounter (Signed)
Pt came in for appt today with Gillian Shields, NP. Requesting refill for warfarin. Pt daughter stated he needs refilled this week. Thank you!

## 2021-11-03 NOTE — Patient Outreach (Signed)
[facility]) Care Management  11/03/2021  Roberto Dickson September 20, 1941 [id number]   Telephone outreach to patient to obtain mRS was successfully completed. MRS= 2   [name] Care Management Assistant

## 2021-11-03 NOTE — Progress Notes (Signed)
Office Visit    Patient Name: Roberto Dickson Date of Encounter: 11/03/2021  PCP:  [name], MD   [facility]  Cardiologist:  Jodelle Red, MD  Advanced Practice Provider:  No care team member to display Electrophysiologist:  None      Chief Complaint    Roberto Dickson is a 80 y.o. male with a hx of CVA, HTN, HLD, LV thrombus, CAD presents today for follow-up after cardiac catheterization.   Past Medical History    Past Medical History:  Diagnosis Date   Hypertension    Past Surgical History:  Procedure Laterality Date   APPENDECTOMY     in his 70's   CORONARY ANGIOGRAPHY N/A 10/20/2021   Procedure: CORONARY ANGIOGRAPHY;  Surgeon: Kathleene Hazel, MD;  Location: [facility] INVASIVE CV LAB;  Service: Cardiovascular;  Laterality: N/A;   IR CT HEAD LTD  07/19/2021   IR FLUORO GUIDED NEEDLE PLC ASPIRATION/INJECTION LOC  07/21/2021   IR PERCUTANEOUS ART THROMBECTOMY/INFUSION INTRACRANIAL INC DIAG ANGIO  07/19/2021   RADIOLOGY WITH ANESTHESIA N/A 07/19/2021   Procedure: IR WITH ANESTHESIA;  Surgeon: Julieanne Cotton, MD;  Location: [facility] OR;  Service: Radiology;  Laterality: N/A;    Allergies  No Known Allergies  History of Present Illness    Roberto Dickson is a 80 y.o. male with a hx of CVA, HTN, HLD, LV thrombus, CAD last seen for cardiac catheterization.   He was admitted 07/26/2021 - 08/10/2021 with acute ischemic left middle cerebral artery (MCA) stroke.  He received tPA and additionally underwent cerebral angiography with revascularization of left ICA from proximal to distal terminus and origin of left MCA.  Echocardiogram with LVEF 50 to 55% with apical thrombus and Coumadin started.  There were wall motion abnormalities noted by echocardiogram but this was recommended for outpatient duration.[name]  He was recommended for CIR.  Seen in follow up 08/31/21 with his daughter. He was working with [facility] with improvement in  strength in right hand and leg but still fair ways to go to get back to baseline. He reported no chest pain nor dyspnea. Stress test was recommended due to wall motion abnormalities by echo. Stress test 09/06/21 with large size, severe severity mid to apical inferior mostly fixed perfusion defect suggestive of large RCA territory scar with peri-infarct ischemia. LVEF read as 29% but suspected to be falsely low.  Cardiac CT 10/2022With calcium score 6053 and significant three vessel disease with likely CTO of LAD. Subsequent cardiac cath 10/20/21 with severe triple vessel CAD (CTO mid LAD, distal LAD diffusely diseased filing with L-L collaterals, moderate caliber diagonal branch severe disease, severe disease Cx and first obtuse marginal, CTO dominant RCA with L-R collaterals). PCI not an option. Plan for outpatient CT surgery consult given may not be good candidate for CABG given recent CVA and LV thrombus. He had preserved LVEF.  Presents today for follow up. Cardiac cath reviewed. Cath sites healing appropriately. Visit assisted by daughter and interpretor. Reports no shortness of breath nor dyspnea on exertion. Reports no chest pain, pressure, or tightness. No edema, orthopnea, PND. Reports no palpitations.  Despite significant coronary calcification remains asymptomatic. Given frequency of recent appointments and testing they asked if echocardiogram could be delayed. LV thrombus diagnosed 07/20/21 with plan for repeat echo after 3-6 months of anticoagulation. Plan to defer echo for 2 months, initially scheduled later this month.    EKGs/Labs/Other Studies Reviewed:   The following studies were reviewed today:  Coronary angiography 10/20/21   Mid RCA lesion is 100% stenosed.   Mid Cx to Dist Cx lesion is 90% stenosed.   1st Mrg lesion is 90% stenosed.   Prox Cx to Mid Cx lesion is 80% stenosed.   Mid LAD to Dist LAD lesion is 100% stenosed.   2nd Diag lesion is 90% stenosed.   Ost LAD to Mid LAD  lesion is 30% stenosed.   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 The distal LAD is diffusely diseased and fills from left to left collaterals. The moderate caliber diagonal branch has severe disease. Severe disease in the mid and distal AV groove Circumflex. Severe disease in the first obtuse marginal branch Chronic total occlusion of the dominant RCA in the mid segment. The distal vessel fills from left to right collaterals.    Recommendations: He has an LV thrombus by recent echo and has been on coumadin. He has been bridged with Lovenox for this procedure today. I did not cross into the LV today due to the LV thrombus. He will be admitted post cath and we will start IV heparin 8 hours post sheath pull. I would anticipate that he will be watched overnight and then Lovenox bridging resumed tomorrow if no access site bleeding. Resume coumadin tomorrow. In regards to his CAD, he has severe three vessel CAD with CTO of the RCA and LAD. PCI is not an option. His options include medical management vs CABG. Given advanced age, recent stroke and LV thrombus, he may not be a good candidate for CABG. This would be delayed for several months anyway given LV thrombus and recent stroke. We can plan an outpatient CT surgery consult in several weeks post discharge. Fortunately he has overall preserved LV systolic function and is asymptomatic.    Cardiac CT 09/29/21 IMPRESSION: 1. Coronary calcium score of 6053. This was 99th percentile for age-, sex, and race-matched controls.   2. Normal coronary origin with right dominance.   3. 100% occlusion of the mid LAD that likely a chronic total occlusion with collateral flow filling the vessel distally.   4. Moderate (50-69%) mixed density plaque in the mid LCX.   5. Severe stenosis in the proximal RCA (70-99%) with 100% occlusion versus subtotal occlusion of the mid RCA.   6. The apical myocardium is thinned and aneurysmal consistent  with prior LAD infarction.   7. There is a filling defect in the apex (measures 26 mm x 26 mm) consistent with LV thrombus.   RECOMMENDATIONS: 1. Cardiac catheterization is recommended due to concerns for 3-vessel CAD.   2. LV thrombus is known from prior imaging.   Myoview 09/07/21   Findings are consistent with prior myocardial infarction with peri-infarct ischemia. The study is high risk.   No ST deviation was noted.   LV perfusion is abnormal. There is no evidence of ischemia. There is evidence of infarction. Defect 1: There is a large defect with severe reduction in uptake present in the apical to mid inferior location(s) that is partially reversible. Viability is present. There is abnormal wall motion in the defect area. Consistent with infarction and peri-infarct ischemia.   Left ventricular function is abnormal. Global function is severely reduced. There was a single regional abnormality. Nuclear stress EF: 29 %. The left ventricular ejection fraction is severely decreased (<30%). End diastolic cavity size is mildly enlarged. End systolic cavity size is mildly enlarged.   Prior study not available for comparison.   Large  size, severe severity mid to apical inferior mostly fixed perfusion defect, suggestive of large RCA territory scar with peri-infarct ischemia (SDS 5). LVEF 29% with inferior and apical akinesis (suspect LVEF is falsely low, recommend echo correlation). This is a high risk study. No prior for comparison.  Echo 07/20/21  1. 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 Apical, apical anterior,  and apical septal akinesis. Inferoseptal hypokinesis. Left ventricular  ejection fraction, by estimation, is 50 to 55%. The left ventricle has low  normal function. The left  ventricle demonstrates regional wall motion abnormalities (see scoring  diagram/findings for description). Left ventricular diastolic parameters  are consistent with Grade I diastolic dysfunction (impaired  relaxation).   2. Right ventricular systolic function is normal. The right ventricular  size is normal.   3. The mitral valve is normal in structure. Trivial mitral valve  regurgitation. No evidence of mitral stenosis.   4. The aortic valve is normal in structure. Aortic valve regurgitation is  not visualized. No aortic stenosis is present.   5. The inferior vena cava is normal in size with greater than 50%  respiratory variability, suggesting right atrial pressure of 3 mmHg.   EKG:No EKG today.   Recent Labs: 07/21/2021: B Natriuretic Peptide 82.3 10/13/2021: ALT 19 10/21/2021: BUN 22; Creatinine, Ser 1.57; Hemoglobin 10.7; Platelets 203; Potassium 3.8; Sodium 137  Recent Lipid Panel    Component Value Date/Time   CHOL 128 10/13/2021 0000   TRIG 134 10/13/2021 0000   HDL 30 (L) 10/13/2021 0000   CHOLHDL 4.3 10/13/2021 0000   CHOLHDL 6.0 07/20/2021 0536   VLDL 17 07/20/2021 0536   LDLCALC 74 10/13/2021 0000   Home Medications   Current Meds  Medication Sig   acetaminophen (TYLENOL) 325 MG tablet Take 1-2 tablets (325-650 mg total) by mouth every 4 (four) hours as needed for mild pain.   aspirin EC 81 MG EC tablet Take 1 tablet (81 mg total) by mouth daily. Swallow whole.   atorvastatin (LIPITOR) 80 MG tablet Take 1 tablet (80 mg total) by mouth daily.   docusate sodium (COLACE) 100 MG capsule Take 1 capsule (100 mg total) by mouth 2 (two) times daily. (Patient taking differently: Take 100 mg by mouth daily.)   melatonin 3 MG TABS tablet Take 0.5 tablets (1.5 mg total) by mouth at bedtime. OVER THE COUNTER   metoprolol succinate (TOPROL-XL) 50 MG 24 hr tablet Take 1 tablet (50 mg total) by mouth at bedtime.   pantoprazole (PROTONIX) 40 MG tablet TAKE 1 TABLET(40 MG) BY MOUTH AT BEDTIME   polyethylene glycol (MIRALAX / GLYCOLAX) 17 g packet Take 17 g by mouth daily.   tamsulosin (FLOMAX) 0.4 MG CAPS capsule TAKE 1 CAPSULE(0.4 MG) BY MOUTH DAILY   tiZANidine (ZANAFLEX) 2 MG tablet  TAKE 1 TABLET(2 MG) BY MOUTH AT BEDTIME   warfarin (COUMADIN) 6 MG tablet TAKE 1 TABLET(6 MG) BY MOUTH DAILY WITH SUPPER (Patient taking differently: Take 3-6 mg by mouth daily at 4 PM. Take 3 mg on Sunday, Thursday all the other day take 6 mg)     Review of Systems    All other systems reviewed and are otherwise negative except as noted above.  Physical Exam    VS:  BP 138/88   Pulse (!) 44   Ht 5\' 6"  (1.676 m)   Wt 175 lb (79.4 kg)   BMI 28.25 kg/m  , BMI Body mass index is 28.25 kg/m.  Wt Readings from Last 3 Encounters:  11/03/21 175 lb (79.4 kg)  10/21/21 175 lb 3.2 oz (79.5 kg)  10/13/21 179 lb (81.2 kg)    GEN: Well nourished, well developed, in no acute distress. HEENT: normal. Neck: Supple, no JVD, carotid bruits, or masses. Cardiac: RRR, no murmurs, rubs, or gallops. No clubbing, cyanosis, edema.  Radials/PT 2+ and equal bilaterally.  Respiratory:  Respirations regular and unlabored, clear to auscultation bilaterally. GI: Soft, nontender, nondistended. MS: No deformity or atrophy. Skin: Warm and dry, no rash. R radial catheterization site with no ecchymosis nor signs of infection.  Neuro:  Strength and sensation are intact. Psych: Normal affect.  Assessment & Plan   CAD - Echo 10/2021 with significant 3 vessel CAD not PCI candidate. LVEF preserved. Upcoming appointment to establish with TCTS. May not be surgical candidate given recent CVA and LV thrombus. Daughter and patient are interesting in pursuing medical therapy at this time given he is asymptomatic with no chest pain nor dyspnea. His GDMT includes aspirin, metoprolol, atorvastatin. If anginal symptoms develop, could consider Imdur or alternative antianginal agent. Heart healthy diet and regular cardiovascular exercise encouraged.    History of CVA-Continue to follow with neurology.  Continue atorvastatin, aspirin. Continues to work with [facility] on R hand and RLE defecits.   LV thrombus noted 07/2021 with  anticoagulation-tolerating Coumadin without bleeding complications. Plan to repeat echo 01/2021 (initially scheduled 11/2021) to reduce burden of medical appointments and e will have completed 6 months of anticoagulation.  Educated to report hematuria, melena. Follows routinely with coumadin clinic.   HTN- BP well controlled. Continue current antihypertensive regimen.    HLD, LDL goal less than 70-07/10/2021 cholesterol 179, HDL 30, LDL 32, triglycerides 84.  10/13/2021 LDL 74. Atorvastatin increased to 80mg  daily. Repeat FLP/LFT in 2-3 months. Will coordinate at follow up.   Disposition: Follow up in 2 months with Dr. Harrell Gave or APP.  Signed, Loel Dubonnet, NP 11/03/2021, 2:36 PM [facility]

## 2021-11-03 NOTE — Telephone Encounter (Signed)
Prescription refill request received for warfarin Lov: 11/03/21 Next INR check: 11/09/21 Warfarin tablet strength: 6mg   Appropriate dose and refill sent to requested pharmacy.

## 2021-11-03 NOTE — Patient Instructions (Signed)
Medication Instructions:  Continue your current medications.   *If you need a refill on your cardiac medications before your next appointment, please call your pharmacy*   Lab Work: None ordered today.   Testing/Procedures: Your physician has requested that you have an echocardiogram. We will reschedule this for 2 months from now. Echocardiography is a painless test that uses sound waves to create images of your heart. It provides your doctor with information about the size and shape of your heart and how well your heart's chambers and valves are working. This procedure takes approximately one hour. There are no restrictions for this procedure.    Follow-Up: At [facility], you and your health needs are our priority.  As part of our continuing mission to provide you with exceptional heart care, we have created designated Provider Care Teams.  These Care Teams include your primary Cardiologist (physician) and Advanced Practice Providers (APPs -  Physician Assistants and Nurse Practitioners) who all work together to provide you with the care you need, when you need it.  We recommend signing up for the patient portal called "MyChart".  Sign up information is provided on this After Visit Summary.  MyChart is used to connect with patients for Virtual Visits (Telemedicine).  Patients are able to view lab/test results, encounter notes, upcoming appointments, etc.  Non-urgent messages can be sent to your provider as well.   To learn more about what you can do with MyChart, go to [email or web address].    Your next appointment:   2 month(s)  The format for your next appointment:   In Person  Provider:   Jodelle Red, MD or Alver Sorrow, NP     Other Instructions   For coronary artery disease often called "heart disease" we aim for optimal guideline directed medical therapy. We use the "A, B, C"s to help keep [place] on track!  A = Aspirin 81mg  daily B = Beta blocker which helps  to relax the heart. This is your Metoprolol. C = Cholesterol control. You take Atorvastatin to help control your cholesterol.   If you have new symptoms of chest pain or shortness of breath we can add additional medical therapy to improve symptoms.

## 2021-11-09 ENCOUNTER — Ambulatory Visit (INDEPENDENT_AMBULATORY_CARE_PROVIDER_SITE_OTHER): Payer: Medicare Other | Admitting: Emergency Medicine

## 2021-11-09 ENCOUNTER — Ambulatory Visit (INDEPENDENT_AMBULATORY_CARE_PROVIDER_SITE_OTHER): Payer: Medicare Other | Admitting: *Deleted

## 2021-11-09 ENCOUNTER — Encounter: Payer: Self-pay | Admitting: Emergency Medicine

## 2021-11-09 ENCOUNTER — Other Ambulatory Visit: Payer: Self-pay

## 2021-11-09 VITALS — BP 138/78 | HR 61 | Ht 66.0 in | Wt 175.0 lb

## 2021-11-09 DIAGNOSIS — E785 Hyperlipidemia, unspecified: Secondary | ICD-10-CM

## 2021-11-09 DIAGNOSIS — Z5181 Encounter for therapeutic drug level monitoring: Secondary | ICD-10-CM | POA: Diagnosis not present

## 2021-11-09 DIAGNOSIS — I25118 Atherosclerotic heart disease of native coronary artery with other forms of angina pectoris: Secondary | ICD-10-CM | POA: Diagnosis not present

## 2021-11-09 DIAGNOSIS — Z7689 Persons encountering health services in other specified circumstances: Secondary | ICD-10-CM

## 2021-11-09 DIAGNOSIS — I63512 Cerebral infarction due to unspecified occlusion or stenosis of left middle cerebral artery: Secondary | ICD-10-CM

## 2021-11-09 DIAGNOSIS — Z8673 Personal history of transient ischemic attack (TIA), and cerebral infarction without residual deficits: Secondary | ICD-10-CM | POA: Insufficient documentation

## 2021-11-09 DIAGNOSIS — I513 Intracardiac thrombosis, not elsewhere classified: Secondary | ICD-10-CM

## 2021-11-09 DIAGNOSIS — Z7901 Long term (current) use of anticoagulants: Secondary | ICD-10-CM

## 2021-11-09 DIAGNOSIS — I10 Essential (primary) hypertension: Secondary | ICD-10-CM | POA: Diagnosis not present

## 2021-11-09 DIAGNOSIS — Z09 Encounter for follow-up examination after completed treatment for conditions other than malignant neoplasm: Secondary | ICD-10-CM

## 2021-11-09 HISTORY — DX: Personal history of transient ischemic attack (TIA), and cerebral infarction without residual deficits: Z86.73

## 2021-11-09 LAB — POCT INR: INR: 2.5 (ref 2.0–3.0)

## 2021-11-09 NOTE — Assessment & Plan Note (Signed)
Well-controlled hypertension. BP Readings from Last 3 Encounters:  11/09/21 138/78  11/03/21 138/88  10/21/21 (!) 148/85  Continue metoprolol succinate 50 mg.

## 2021-11-09 NOTE — Progress Notes (Addendum)
Roberto Dickson 80 y.o.   Chief Complaint  Patient presents with   New Patient (Initial Visit)    Hospital f/u. Chronic health conditions. Pt has hx of stroke and heart problems. Pt states his hands shake. Need lab work for warfarin levels   Eye Problem    Right eye is red, x 1 wk    HISTORY OF PRESENT ILLNESS: This is a 80 y.o. male first visit to this office, here to establish care with me. Recent hospitalization due to stroke.  Cardiac work-up disclosed left ventricular thrombus and severe coronary artery disease. Roberto Dickson is a 80 y.o. male with a hx of CVA, HTN, HLD, LV thrombus, CAD presents today for follow-up after cardiac catheterization  He was admitted 07/26/2021 - 08/10/2021 with acute ischemic left middle cerebral artery (MCA) stroke. He received tPA and additionally underwent cerebral angiography with revascularization of left ICA from proximal to distal terminus and origin of left MCA. Echocardiogram with LVEF 50 to 55% with apical thrombus and Coumadin started. There were wall motion abnormalities noted by echocardiogram but this was recommended for outpatient duration.Roberto Dickson He was recommended for CIR.  Coronary angiography 10/20/21   Mid RCA lesion is 100% stenosed.   Mid Cx to Dist Cx lesion is 90% stenosed.   1st Mrg lesion is 90% stenosed.   Prox Cx to Mid Cx lesion is 80% stenosed.   Mid LAD to Dist LAD lesion is 100% stenosed.   2nd Diag lesion is 90% stenosed.   Ost LAD to Mid LAD lesion is 30% stenosed.   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 The distal LAD is diffusely diseased and fills from left to left collaterals. The moderate caliber diagonal branch has severe disease. Severe disease in the mid and distal AV groove Circumflex. Severe disease in the first obtuse marginal branch Chronic total occlusion of the dominant RCA in the mid segment. The distal vessel fills from left to right collaterals.   Assessment & Plan    CAD -  Echo 10/2021 with significant 3 vessel CAD not PCI candidate. LVEF preserved. Upcoming appointment to establish with TCTS. May not be surgical candidate given recent CVA and LV thrombus. Daughter and patient are interesting in pursuing medical therapy at this time given he is asymptomatic with no chest pain nor dyspnea. His GDMT includes aspirin, metoprolol, atorvastatin. If anginal symptoms develop, could consider Imdur or alternative antianginal agent. Heart healthy diet and regular cardiovascular exercise encouraged.     History of CVA-Continue to follow with neurology.  Continue atorvastatin, aspirin. Continues to work with [facility] on R hand and RLE defecits.    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 Plan to repeat echo 01/2021 (initially scheduled 11/2021) to reduce burden of medical appointments and e will have completed 6 months of anticoagulation.  Educated to report hematuria, melena. Follows routinely with coumadin clinic.    HTN- BP well controlled. Continue current antihypertensive regimen.     HLD, LDL goal less than 70-07/10/2021 cholesterol 179, HDL 30, LDL 32, triglycerides 84.  10/13/2021 LDL 74. Atorvastatin increased to 80mg  daily. Repeat FLP/LFT in 2-3 months. Will coordinate at follow up.    Disposition: Follow up in 2 months with Dr. Harrell Gave or APP.   Signed, Loel Dubonnet, NP 11/03/2021, 2:36 PM [facility]    Eye Problem  Pertinent negatives include no fever, nausea or vomiting.    Prior to Admission medications  Medication Sig Start Date End Date Taking? Authorizing Provider  acetaminophen (TYLENOL) 325 MG tablet Take 1-2 tablets (325-650 mg total) by mouth every 4 (four) hours as needed for mild pain. 08/10/21  Yes Love, Roberto Anchors, PA-C  aspirin EC 81 MG EC tablet Take 1 tablet (81 mg total) by mouth daily. Swallow whole. 10/22/21  Yes Cheryln Manly, NP  atorvastatin (LIPITOR) 80 MG  tablet Take 1 tablet (80 mg total) by mouth daily. 11/03/21  Yes Loel Dubonnet, NP  docusate sodium (COLACE) 100 MG capsule Take 1 capsule (100 mg total) by mouth 2 (two) times daily. Patient taking differently: Take 100 mg by mouth daily. 08/10/21  Yes Love, Roberto Anchors, PA-C  melatonin 3 MG TABS tablet Take 0.5 tablets (1.5 mg total) by mouth at bedtime. OVER THE COUNTER 08/10/21  Yes Love, Roberto Anchors, PA-C  metoprolol succinate (TOPROL-XL) 50 MG 24 hr tablet Take 1 tablet (50 mg total) by mouth at bedtime. 08/31/21  Yes Loel Dubonnet, NP  pantoprazole (PROTONIX) 40 MG tablet TAKE 1 TABLET(40 MG) BY MOUTH AT BEDTIME 11/01/21  Yes Kirsteins, Luanna Salk, MD  polyethylene glycol (MIRALAX / GLYCOLAX) 17 g packet Take 17 g by mouth daily. 08/11/21  Yes Love, Roberto Anchors, PA-C  tamsulosin (FLOMAX) 0.4 MG CAPS capsule TAKE 1 CAPSULE(0.4 MG) BY MOUTH DAILY 11/01/21  Yes Kirsteins, Luanna Salk, MD  tiZANidine (ZANAFLEX) 2 MG tablet TAKE 1 TABLET(2 MG) BY MOUTH AT BEDTIME 10/03/21  Yes Kirsteins, Luanna Salk, MD  warfarin (COUMADIN) 6 MG tablet Take 0.5-1 tablets (3-6 mg total) by mouth daily at 4 PM. Take 3 mg on Sunday, Thursday all the other day take 6 mg 11/03/21  Yes Buford Dresser, MD    No Known Allergies  Patient Active Problem List   Diagnosis Date Noted   Hyperlipidemia 10/21/2021   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   Coronary artery disease of native artery of native heart with stable angina pectoris ([place])    [place], post-stroke 08/25/2021   Abnormality of gait 08/25/2021   Encounter for therapeutic drug monitoring 08/12/2021   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/12/2021   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   AKI (acute kidney injury) ([place])    Sleep disturbance    Acute blood loss anemia    Essential hypertension    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[place]) 07/26/2021   Pressure injury of skin 07/24/2021   Acute ischemic left MCA stroke ([place]) 07/19/2021   Internal  carotid artery occlusion, 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    Past Medical History:  Diagnosis Date   Hypertension     Past Surgical History:  Procedure Laterality Date   APPENDECTOMY     in his 70's   CORONARY ANGIOGRAPHY N/A 10/20/2021   Procedure: CORONARY ANGIOGRAPHY;  Surgeon: Burnell Blanks, MD;  Location: [place] [facility];  Service: Cardiovascular;  Laterality: N/A;   IR CT HEAD LTD  07/19/2021   IR FLUORO GUIDED NEEDLE PLC ASPIRATION/INJECTION LOC  07/21/2021   IR PERCUTANEOUS ART THROMBECTOMY/INFUSION INTRACRANIAL INC DIAG ANGIO  07/19/2021   RADIOLOGY WITH ANESTHESIA N/A 07/19/2021   Procedure: IR WITH ANESTHESIA;  Surgeon: Luanne Bras, MD;  Location: [place];  Service: Radiology;  Laterality: N/A;    Social History   Socioeconomic History   Marital status: Married    Spouse name: Not on file   Number of children: Not on file   Years of education: Not on file   Highest education level: Not on  file  Occupational History   Not on file  Tobacco Use   Smoking status: Never   Smokeless tobacco: Never  Vaping Use   Vaping Use: Never used  Substance and Sexual Activity   Alcohol use: Not Currently    Alcohol/week: 1.0 standard drink    Types: 1 Cans of beer per week   Drug use: Never   Sexual activity: Not on file  Other Topics Concern   Not on file  Social History Narrative   Not on file   Social Determinants of Health   Financial Resource Strain: Not on file  Food Insecurity: Not on file  Transportation Needs: Not on file  Physical Activity: Not on file  Stress: Not on file  Social Connections: Not on file  Intimate Partner Violence: Not on file    Family History  Problem Relation Age of Onset   Cancer - Prostate Father      Review of Systems  Constitutional: Negative.  Negative for chills and fever.  HENT: Negative.  Negative for congestion, nosebleeds and sore throat.   Respiratory: Negative.  Negative for cough and shortness of breath.    Cardiovascular: Negative.  Negative for chest pain and palpitations.  Gastrointestinal:  Negative for abdominal pain, blood in stool, nausea and vomiting.  Genitourinary: Negative.  Negative for hematuria.  Skin: Negative.  Negative for rash.  Neurological: Negative.  Negative for dizziness and headaches.       Shaky hands  All other systems reviewed and are negative.  Today's Vitals   11/09/21 1405  BP: 138/78  Pulse: 61  SpO2: 96%  Weight: 175 lb (79.4 kg)  Height: 5\' 6"  (1.676 m)   Body mass index is 28.25 kg/m.   Physical Exam Constitutional:      Appearance: Normal appearance.  HENT:     Head: Normocephalic.  Eyes:     Extraocular Movements: Extraocular movements intact.     Conjunctiva/sclera: Conjunctivae normal.     Pupils: Pupils are equal, round, and reactive to light.  Cardiovascular:     Rate and Rhythm: Normal rate and regular rhythm.     Pulses: Normal pulses.     Heart sounds: Normal heart sounds.  Pulmonary:     Effort: Pulmonary effort is normal.     Breath sounds: Normal breath sounds.  Abdominal:     Palpations: Abdomen is soft.     Tenderness: There is no abdominal tenderness.  Musculoskeletal:     Cervical back: No tenderness.     Right lower leg: No edema.     Left lower leg: No edema.  Lymphadenopathy:     Cervical: No cervical adenopathy.  Skin:    General: Skin is warm and dry.     Capillary Refill: Capillary refill takes less than 2 seconds.  Neurological:     Mental Status: He is alert and oriented to person, place, and time.     Motor: Weakness (Right-sided deficit) present.     Gait: Gait normal.  Psychiatric:        Mood and Affect: Mood normal.        Behavior: Behavior normal.     ASSESSMENT & PLAN: A total of 60 minutes was spent with the patient and counseling/coordination of care regarding preparing for this visit, establishing care with me, review of discharge summary from recent hospitalization, review of most recent  cardiologist office visit notes, review of all medications including Coumadin and risk of bleeding, fall precautions, review of most recent  blood work results, review of most recent imaging results, comprehensive history and physical examination, prognosis, documentation, need for follow-up.  Problem List Items Addressed This Visit       Cardiovascular and Mediastinum   Essential hypertension    Well-controlled hypertension. BP Readings from Last 3 Encounters:  11/09/21 138/78  11/03/21 138/88  10/21/21 (!) 148/85  Continue metoprolol succinate 50 mg.       Left ventricular thrombus    Continue anticoagulation with Coumadin.  Tolerating Coumadin well without bleeding complications.  Follow-up with cardiologist.      Coronary artery disease of native artery of native heart with stable angina pectoris ([place])    Continues chest pain-free.  Severe triple-vessel coronary artery disease.  Not PCI candidate.  Preserved left ventricular ejection fraction.  High risk surgical candidate.        Other   Hyperlipidemia    Atorvastatin was increased to 80 mg daily.  Goal is to keep LDL less than 70.      Recent cerebrovascular accident (CVA)    Continues to improve.  Physical therapy helping.  Right-sided deficits improving.  Still has "shaky hands". Continue atorvastatin and aspirin.      Other Visit Diagnoses     Hospital discharge follow-up    -  Primary   Dyslipidemia       LV (left ventricular) mural thrombus       Current use of long term anticoagulation       Encounter to establish care          Patient Instructions  Mantenimiento de la salud en [place] Maintenance, Male Adoptar un estilo de vida saludable y recibir atencin preventiva son importantes para promover la salud y Musician. Consulte al mdico sobre: El esquema adecuado para hacerse pruebas y exmenes peridicos. Cosas que puede hacer por su cuenta para prevenir enfermedades y [place]  sano. Qu debo saber sobre la dieta, el peso y el ejercicio? Consuma una dieta saludable  Consuma una dieta que incluya muchas verduras, frutas, productos lcteos con bajo contenido de [place] y Advertising account planner. No consuma muchos alimentos ricos en grasas slidas, azcares agregados o sodio. Mantenga un peso saludable El ndice de masa muscular [facility]) es una medida que puede utilizarse para identificar posibles problemas de [place]. Proporciona una estimacin de la grasa corporal basndose en el peso y la altura. Su mdico puede ayudarle a Radiation protection practitioner [place] y a Scientist, forensic o Theatre manager un peso saludable. Haga ejercicio con regularidad Haga ejercicio con regularidad. Esta es una de las prcticas ms importantes que puede hacer por su salud. La [employer] de los adultos deben seguir estas pautas: Optometrist, al menos, 150 minutos de actividad fsica por semana. El ejercicio debe aumentar la frecuencia cardaca y Nature conservation officer transpirar (ejercicio de intensidad moderada). Hacer ejercicios de fortalecimiento por lo [employer] por semana. Agregue esto a su plan de ejercicio de intensidad moderada. Pase menos tiempo sentado. Incluso la actividad fsica ligera puede ser beneficiosa. Controle sus niveles de colesterol y lpidos en la sangre Comience a realizarse anlisis de lpidos y Research officer, trade union en la sangre a los 6 aos y luego reptalos cada 5 aos. Es posible que Automotive engineer los niveles de colesterol con mayor frecuencia si: Sus niveles de lpidos y colesterol son altos. Es mayor de 16 aos. Presenta un alto riesgo de padecer enfermedades cardacas. Qu debo saber sobre las pruebas de deteccin del cncer? Muchos tipos de cncer pueden detectarse de [place] temprana y, a menudo, pueden  prevenirse. Segn su historia clnica y sus antecedentes familiares, es posible que deba realizarse pruebas de deteccin del cncer en diferentes edades. Esto puede incluir pruebas de deteccin de lo siguiente: Youth worker. Cncer de prstata. Cncer de piel. Cncer de pulmn. Qu debo saber sobre la enfermedad cardaca, la diabetes y la hipertensin arterial? Presin arterial y enfermedad cardaca La hipertensin arterial causa enfermedades cardacas y [place] el riesgo de accidente cerebrovascular. Es ms probable que esto se manifieste en las personas que tienen lecturas de presin arterial alta o tienen sobrepeso. Hable con el mdico sobre sus valores de presin arterial deseados. Hgase controlar la presin arterial: Cada 3 a 5 aos si tiene entre 18 y 64 aos. Todos los aos si es mayor de 40 aos. Si tiene entre 65 y 63 aos y es fumador o Insurance underwriter, pregntele al mdico si debe realizarse una prueba de deteccin de aneurisma artico abdominal (AAA) por nica vez. Diabetes Realcese exmenes de deteccin de la diabetes con regularidad. Este anlisis revisa el nivel de azcar en la sangre en [place]. Hgase las pruebas de deteccin: Cada tres aos despus de los 45 aos de edad si tiene un peso normal y un bajo riesgo de padecer diabetes. Con ms frecuencia y a partir de [place] edad inferior si tiene sobrepeso o un alto riesgo de padecer diabetes. Qu debo saber sobre la prevencin de infecciones? Hepatitis B Si tiene un riesgo ms alto de contraer hepatitis B, debe someterse a un examen de deteccin de este virus. Hable con el mdico para averiguar si tiene riesgo de contraer la infeccin por hepatitis B. Hepatitis C Se recomienda un anlisis de [place] para: Todos los que nacieron entre 1943 y [phone]. Todas las personas que tengan un riesgo de haber contrado hepatitis C. Enfermedades de transmisin sexual (ETS) Debe realizarse pruebas de deteccin de ITS todos los aos, incluidas la gonorrea y la clamidia, si: Es sexualmente activo y es menor de [address]. Es mayor de [address], y Public affairs consultant informa que corre riesgo de tener este tipo de infecciones. La actividad sexual ha cambiado desde que  le hicieron la ltima prueba de deteccin y tiene un riesgo mayor de Warehouse manager clamidia o Copy. Pregntele al mdico si usted tiene riesgo. Pregntele al mdico si usted tiene un alto riesgo de Primary school teacher VIH. El mdico tambin puede recomendarle un medicamento recetado para ayudar a evitar la infeccin por el VIH. Si elige tomar medicamentos para prevenir el VIH, primero debe [employer] de deteccin del VIH. Luego debe hacerse anlisis cada 3 meses mientras est tomando los medicamentos. Siga estas indicaciones en su casa: Consumo de alcohol No beba alcohol si el mdico se lo prohbe. Si bebe alcohol: Limite la cantidad que consume de 0 a 2 bebidas por da. Sepa cunta cantidad de alcohol hay en las bebidas que toma. En los [address], una medida equivale a una botella de cerveza de 12 oz (355 ml), un vaso de vino de 5 oz (148 ml) o un vaso de una bebida alcohlica de alta graduacin de 1 oz (44 ml). Estilo de vida No consuma ningn producto que contenga nicotina o tabaco. Estos productos incluyen cigarrillos, tabaco para Theatre manager y aparatos de vapeo, como los Administrator, Civil Service. Si necesita ayuda para dejar de consumir estos productos, consulte al mdico. No consuma drogas. No comparta agujas. Solicite ayuda a su mdico si necesita apoyo o informacin para abandonar las drogas. Indicaciones generales Realcese los estudios de rutina de [address],  dentales y de la vista. [place]. Infrmele a su mdico si: Se siente deprimido con frecuencia. Alguna vez ha sido vctima de [place] o no se siente seguro en su casa. Resumen Adoptar un estilo de vida saludable y recibir atencin preventiva son importantes para promover la salud y Musician. Siga las instrucciones del mdico acerca de una dieta saludable, el ejercicio y la realizacin de pruebas o exmenes para Engineer, building services. Siga las instrucciones del mdico con respecto al control del colesterol  y la presin arterial. Esta informacin no tiene Marine scientist el consejo del mdico. Asegrese de hacerle al mdico cualquier pregunta que tenga. Document Revised: 04/27/2021 Document Reviewed: 04/27/2021 Elsevier Patient Education  2022 [place], MD [facility]

## 2021-11-09 NOTE — Patient Instructions (Signed)
Description   Continue taking Warfarin 1 tablet daily except 1/2 tablet on Sundays and Thursdays. Recheck INR in 3 weeks. Coumadin Clinic [phone]; pt would like Coumadin managed at [facility] site (vicinity).

## 2021-11-09 NOTE — Assessment & Plan Note (Addendum)
Continue anticoagulation with Coumadin.  Tolerating Coumadin well without bleeding complications.  Follow-up with cardiologist.

## 2021-11-09 NOTE — Assessment & Plan Note (Signed)
Continues chest pain-free.  Severe triple-vessel coronary artery disease.  Not PCI candidate.  Preserved left ventricular ejection fraction.  High risk surgical candidate.

## 2021-11-09 NOTE — Assessment & Plan Note (Signed)
Atorvastatin was increased to 80 mg daily.  Goal is to keep LDL less than 70.

## 2021-11-09 NOTE — Patient Instructions (Signed)
Mantenimiento de la salud en los hombres °Health Maintenance, Male °Adoptar un estilo de vida saludable y recibir atención preventiva son importantes para promover la salud y el bienestar. Consulte al médico sobre: °El esquema adecuado para hacerse pruebas y exámenes periódicos. °Cosas que puede hacer por su cuenta para prevenir enfermedades y mantenerse sano. °¿Qué debo saber sobre la dieta, el peso y el ejercicio? °Consuma una dieta saludable ° °Consuma una dieta que incluya muchas verduras, frutas, productos lácteos con bajo contenido de grasa y proteínas magras. °No consuma muchos alimentos ricos en grasas sólidas, azúcares agregados o sodio. °Mantenga un peso saludable °El índice de masa muscular (IMC) es una medida que puede utilizarse para identificar posibles problemas de peso. Proporciona una estimación de la grasa corporal basándose en el peso y la altura. Su médico puede ayudarle a determinar su IMC y a lograr o mantener un peso saludable. °Haga ejercicio con regularidad °Haga ejercicio con regularidad. Esta es una de las prácticas más importantes que puede hacer por su salud. La mayoría de los adultos deben seguir estas pautas: °Realizar, al menos, 150 minutos de actividad física por semana. El ejercicio debe aumentar la frecuencia cardíaca y hacerlo transpirar (ejercicio de intensidad moderada). °Hacer ejercicios de fortalecimiento por lo menos dos veces por semana. Agregue esto a su plan de ejercicio de intensidad moderada. °Pase menos tiempo sentado. Incluso la actividad física ligera puede ser beneficiosa. °Controle sus niveles de colesterol y lípidos en la sangre °Comience a realizarse análisis de lípidos y colesterol en la sangre a los 20 años y luego repítalos cada 5 años. °Es posible que necesite controlar los niveles de colesterol con mayor frecuencia si: °Sus niveles de lípidos y colesterol son altos. °Es mayor de 40 años. °Presenta un alto riesgo de padecer enfermedades cardíacas. °¿Qué debo  saber sobre las pruebas de detección del cáncer? °Muchos tipos de cáncer pueden detectarse de manera temprana y, a menudo, pueden prevenirse. Según su historia clínica y sus antecedentes familiares, es posible que deba realizarse pruebas de detección del cáncer en diferentes edades. Esto puede incluir pruebas de detección de lo siguiente: °Cáncer colorrectal. °Cáncer de próstata. °Cáncer de piel. °Cáncer de pulmón. °¿Qué debo saber sobre la enfermedad cardíaca, la diabetes y la hipertensión arterial? °Presión arterial y enfermedad cardíaca °La hipertensión arterial causa enfermedades cardíacas y aumenta el riesgo de accidente cerebrovascular. Es más probable que esto se manifieste en las personas que tienen lecturas de presión arterial alta o tienen sobrepeso. °Hable con el médico sobre sus valores de presión arterial deseados. °Hágase controlar la presión arterial: °Cada 3 a 5 años si tiene entre 18 y 39 años. °Todos los años si es mayor de 40 años. °Si tiene entre 65 y 75 años y es fumador o solía fumar, pregúntele al médico si debe realizarse una prueba de detección de aneurisma aórtico abdominal (AAA) por única vez. °Diabetes °Realícese exámenes de detección de la diabetes con regularidad. Este análisis revisa el nivel de azúcar en la sangre en ayunas. Hágase las pruebas de detección: °Cada tres años después de los 45 años de edad si tiene un peso normal y un bajo riesgo de padecer diabetes. °Con más frecuencia y a partir de una edad inferior si tiene sobrepeso o un alto riesgo de padecer diabetes. °¿Qué debo saber sobre la prevención de infecciones? °Hepatitis B °Si tiene un riesgo más alto de contraer hepatitis B, debe someterse a un examen de detección de este virus. Hable con el médico para averiguar si tiene riesgo de   contraer la infección por hepatitis B. °Hepatitis C °Se recomienda un análisis de sangre para: °Todos los que nacieron entre 1945 y 1965. °Todas las personas que tengan un riesgo de haber  contraído hepatitis C. °Enfermedades de transmisión sexual (ETS) °Debe realizarse pruebas de detección de ITS todos los años, incluidas la gonorrea y la clamidia, si: °Es sexualmente activo y es menor de 24 años. °Es mayor de 24 años, y el médico le informa que corre riesgo de tener este tipo de infecciones. °La actividad sexual ha cambiado desde que le hicieron la última prueba de detección y tiene un riesgo mayor de tener clamidia o gonorrea. Pregúntele al médico si usted tiene riesgo. °Pregúntele al médico si usted tiene un alto riesgo de contraer VIH. El médico también puede recomendarle un medicamento recetado para ayudar a evitar la infección por el VIH. Si elige tomar medicamentos para prevenir el VIH, primero debe hacerse los análisis de detección del VIH. Luego debe hacerse análisis cada 3 meses mientras esté tomando los medicamentos. °Siga estas indicaciones en su casa: °Consumo de alcohol °No beba alcohol si el médico se lo prohíbe. °Si bebe alcohol: °Limite la cantidad que consume de 0 a 2 bebidas por día. °Sepa cuánta cantidad de alcohol hay en las bebidas que toma. En [place], una medida equivale a una botella de cerveza de 12 oz (355 ml), un vaso de vino de 5 oz (148 ml) o un vaso de una bebida alcohólica de alta graduación de 1½ oz (44 ml). °Estilo de vida °No consuma ningún producto que contenga nicotina o tabaco. Estos productos incluyen cigarrillos, tabaco para mascar y aparatos de vapeo, como los cigarrillos electrónicos. Si necesita ayuda para dejar de consumir estos productos, consulte al médico. °No consuma drogas. °No comparta agujas. °Solicite ayuda a su médico si necesita apoyo o información para abandonar las drogas. °Indicaciones generales °Realícese los estudios de rutina de la salud, dentales y de la vista. °Manténgase al día con las vacunas. °Infórmele a su médico si: °Se siente deprimido con frecuencia. °Alguna vez ha sido víctima de maltrato o no se siente seguro en su  casa. °Resumen °Adoptar un estilo de vida saludable y recibir atención preventiva son importantes para promover la salud y el bienestar. °Siga las instrucciones del médico acerca de una dieta saludable, el ejercicio y la realización de pruebas o exámenes para detectar enfermedades. °Siga las instrucciones del médico con respecto al control del colesterol y la presión arterial. °Esta información no tiene como fin reemplazar el consejo del médico. Asegúrese de hacerle al médico cualquier pregunta que tenga. °Document Revised: 04/27/2021 Document Reviewed: 04/27/2021 °Elsevier Patient Education © 2022 [employer]vier Inc. ° °

## 2021-11-09 NOTE — Assessment & Plan Note (Addendum)
Continues to improve.  Physical therapy helping.  Right-sided deficits improving.  Still has "shaky hands". Continue atorvastatin and aspirin.

## 2021-11-11 ENCOUNTER — Encounter: Payer: Medicare Other | Admitting: Thoracic Surgery (Cardiothoracic Vascular Surgery)

## 2021-11-22 ENCOUNTER — Encounter: Payer: Self-pay | Admitting: Physical Medicine & Rehabilitation

## 2021-11-22 ENCOUNTER — Other Ambulatory Visit: Payer: Self-pay

## 2021-11-22 ENCOUNTER — Encounter: Payer: Medicare Other | Attending: Physical Medicine & Rehabilitation | Admitting: Physical Medicine & Rehabilitation

## 2021-11-22 VITALS — BP 167/88 | HR 66 | Temp 98.2°F | Ht 66.0 in | Wt 172.0 lb

## 2021-11-22 DIAGNOSIS — I6930 Unspecified sequelae of cerebral infarction: Secondary | ICD-10-CM | POA: Diagnosis not present

## 2021-11-22 DIAGNOSIS — M24541 Contracture, right hand: Secondary | ICD-10-CM

## 2021-11-22 DIAGNOSIS — I63512 Cerebral infarction due to unspecified occlusion or stenosis of left middle cerebral artery: Secondary | ICD-10-CM | POA: Diagnosis not present

## 2021-11-22 NOTE — Patient Instructions (Signed)
Please wrap index and middle finger on right side and for a day then remove the 1" tape and bend the fingers to improve range of motion

## 2021-11-22 NOTE — Progress Notes (Signed)
Subjective:    Patient ID: Roberto Dickson, male    DOB: [birth year], 80 y.o.   MRN: [id number] 80 y.o. male with history of hypertension who was admitted on 07/19/2021 with RUE weakness, left gaze deviation and inability to speak.  He received tPA and CTA head/neck showed occlusion of left ICA at origin with occlusion to carotid terminus.  He underwent cerebral angio with revascularization of left ICA from proximal to distal terminus and origin of left MCA by Dr. Estanislado Pandy.  MRI/MRA brain showed scattered acute infarcts in left cerebellum and both cerebral hemispheres largest in left posterior frontal region compatible with cardiac or aortic source.  2D echo done revealing EF of 50 to 55% with apical thrombus.    Postprocedure she was noted to have ecchymosis right groin due to partially thrombosed pseudoaneurysm which was treated with thrombin.  Follow-up ultrasound showed pseudoaneurysm to have resolved with 2.3x3.0 right groin hematoma.  He was also noted to have leukocytosis with low-grade fevers as well as tachypnea and was started on IV rest Rocephin due to concerns of UTI.  Fevers resolved however he continued to have issues with posterior lean, right knee instability, right upper extremity weakness affecting ADLs and mobility.  CIR was recommended due to functional decline. 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  His aphasia has largely resolved.  He has completed inpatient rehab and has been receiving home health which has  been completed as well  Handwriting doing better, practicing.  PT, OT finished New PCP speaks Spanish  Still has right hand shaking , stiffness in the fingers on right side #2 and #3  Modified independent with all self-care and mobility when walking outside  or on uneven surfaces he holds his daughters arm.  She is encouraging him to use a cane.  He has had no issues in the home.  Pain Inventory Average Pain 0 Pain Right Now 0 My pain is  no pain  In the last 24 hours, has pain interfered with the following? General activity 0 Relation with others 0 Enjoyment of life 0 What TIME of day is your pain at its worst? No pain Sleep (in general) NA  Pain is worse with: no pain Pain improves with: no pain Relief from Meds: no pain  Family History  Problem Relation Age of Onset   Cancer - Prostate Father    Social History   Socioeconomic History   Marital status: Married    Spouse name: Not on file   Number of children: Not on file   Years of education: Not on file   Highest education level: Not on file  Occupational History   Not on file  Tobacco Use   Smoking status: Never   Smokeless tobacco: Never  Vaping Use   Vaping Use: Never used  Substance and Sexual Activity   Alcohol use: Not Currently    Alcohol/week: 1.0 standard drink    Types: 1 Cans of beer per week   Drug use: Never   Sexual activity: Not on file  Other Topics Concern   Not on file  Social History Narrative   Not on file   Social Determinants of Health   Financial Resource Strain: Not on file  Food Insecurity: Not on file  Transportation Needs: Not on file  Physical Activity: Not on file  Stress: Not on file  Social Connections: Not on file   Past Surgical History:  Procedure Laterality Date   APPENDECTOMY     in his 30's   CORONARY ANGIOGRAPHY N/A 10/20/2021   Procedure: CORONARY ANGIOGRAPHY;  Surgeon: Burnell Blanks, MD;  Location: [facility];  Service: Cardiovascular;  Laterality: N/A;   IR CT HEAD LTD  07/19/2021   IR FLUORO GUIDED NEEDLE PLC ASPIRATION/INJECTION LOC  07/21/2021   IR PERCUTANEOUS ART THROMBECTOMY/INFUSION INTRACRANIAL INC DIAG ANGIO  07/19/2021   RADIOLOGY WITH ANESTHESIA N/A 07/19/2021   Procedure: IR WITH  ANESTHESIA;  Surgeon: Luanne Bras, MD;  Location: [facility];  Service: Radiology;  Laterality: N/A;   Past Surgical History:  Procedure Laterality Date   APPENDECTOMY     in his 58's   CORONARY ANGIOGRAPHY N/A 10/20/2021   Procedure: CORONARY ANGIOGRAPHY;  Surgeon: Burnell Blanks, MD;  Location: [facility];  Service: Cardiovascular;  Laterality: N/A;   IR CT HEAD LTD  07/19/2021   IR FLUORO GUIDED NEEDLE PLC ASPIRATION/INJECTION LOC  07/21/2021   IR PERCUTANEOUS ART THROMBECTOMY/INFUSION INTRACRANIAL INC DIAG ANGIO  07/19/2021   RADIOLOGY WITH ANESTHESIA N/A 07/19/2021   Procedure: IR WITH ANESTHESIA;  Surgeon: Luanne Bras, MD;  Location: [facility];  Service: Radiology;  Laterality: N/A;   Past Medical History:  Diagnosis Date   Hypertension    BP (!) 167/88    Pulse 66    Temp 98.2 F (36.8 C)    Ht 5\' 6"  (1.676 m)    Wt 172 lb (78 kg)    SpO2 97%    BMI 27.76 kg/m   Opioid Risk Score:   Fall Risk Score:  `1  Depression screen PHQ 2/9  Depression screen [facility] 2/9 11/22/2021 11/09/2021 08/25/2021  Decreased Interest 0 0 0  Down, Depressed, Hopeless 0 0 0  PHQ - 2 Score 0 0 0  Altered sleeping - - 0  Tired, decreased energy - - 0  Change in appetite - - 0  Feeling bad or failure about yourself  - - 1  Trouble concentrating - - 1  Moving slowly or fidgety/restless - - 0  Suicidal thoughts - - 0  PHQ-9 Score - - 2     Review of Systems  Constitutional: Negative.   HENT: Negative.    Eyes: Negative.   Respiratory: Negative.    Cardiovascular: Negative.   Gastrointestinal: Negative.   Endocrine: Negative.   Genitourinary: Negative.   Musculoskeletal: Negative.   Skin: Negative.   Allergic/Immunologic: Negative.   Neurological: Negative.   Hematological:  Bruises/bleeds easily.       Warfarin  Psychiatric/Behavioral: Negative.    All other systems reviewed and are negative.     Objective:   Physical Exam Vitals and nursing note reviewed.   Constitutional:      Appearance: He is normal weight.  HENT:     Head: Normocephalic and atraumatic.  Eyes:     Extraocular Movements: Extraocular movements intact.     Pupils: Pupils are equal, round, and reactive to light.  Musculoskeletal:     Comments: Patient with mild edema of digits 2 and 3 on the right hand.  There is mildly reduced flexion at the right MCP PIP and DIP of digits 2 and 3.  No pain with range of motion no erythema.  No hypersensitivity to touch no vasomotor changes  Skin:    General: Skin is warm and dry.  Neurological:  Mental Status: He is alert and oriented to person, place, and time.  Psychiatric:        Mood and Affect: Mood normal.        Behavior: Behavior normal.   Motor strength is 4/5 in the right deltoid bicep tricep grip 5/5 in the right hip flexor knee extensor ankle dorsiflexor 5/5 in left deltoid bicep tricep grip hip flexor knee extensor ankle dorsiflexor and plantar flexor Sensation reported is normal bilaterally. Ambulates without assistive device no evidence of toe drag or knee instability Able to complete toe walk as well as heel walk but unable to do tandem gait without stepping off the line.      Assessment & Plan:  1.  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  He is modified independent with all self-care mobility he ambulates without assistive device in the home but I would recommend using a cane outside the home. Do not think he needs any further physical therapy In regards to his right upper extremity does have mild edema in the right second and third digits we discussed 1 inch flexible tape wraps on a daily basis for 15 minutes followed by flexion stretching at the MCP PIP DIP of digits 2 and 3 of the right hand. This was discussed with patient's daughter who understands.  I will see him back in 6 months.

## 2021-11-29 ENCOUNTER — Other Ambulatory Visit (HOSPITAL_COMMUNITY): Payer: Medicare Other

## 2021-11-30 ENCOUNTER — Other Ambulatory Visit: Payer: Self-pay | Admitting: Physical Medicine & Rehabilitation

## 2021-12-02 ENCOUNTER — Other Ambulatory Visit: Payer: Self-pay

## 2021-12-02 ENCOUNTER — Institutional Professional Consult (permissible substitution) (INDEPENDENT_AMBULATORY_CARE_PROVIDER_SITE_OTHER): Payer: Medicare Other | Admitting: Thoracic Surgery (Cardiothoracic Vascular Surgery)

## 2021-12-02 ENCOUNTER — Ambulatory Visit (INDEPENDENT_AMBULATORY_CARE_PROVIDER_SITE_OTHER): Payer: Medicare Other

## 2021-12-02 VITALS — BP 160/86 | HR 98 | Resp 20 | Ht 66.0 in | Wt 172.0 lb

## 2021-12-02 DIAGNOSIS — I2510 Atherosclerotic heart disease of native coronary artery without angina pectoris: Secondary | ICD-10-CM

## 2021-12-02 DIAGNOSIS — Z5181 Encounter for therapeutic drug level monitoring: Secondary | ICD-10-CM | POA: Diagnosis not present

## 2021-12-02 DIAGNOSIS — I63512 Cerebral infarction due to unspecified occlusion or stenosis of left middle cerebral artery: Secondary | ICD-10-CM | POA: Diagnosis not present

## 2021-12-02 LAB — POCT INR: INR: 2 (ref 2.0–3.0)

## 2021-12-02 NOTE — Progress Notes (Signed)
[address]       [place] [address]             [phone]        Roberto Dickson [facility] Medical Record #[id number] Date of Birth: 1941/04/03  Referring: Kathleene Hazel* Primary Care: [name], MD Primary Cardiologist:Bridgette Cristal Deer, MD  Chief Complaint:    Chief Complaint  Patient presents with   Coronary Artery Disease    Surgical eval, Cardiac Cath 10/20/21, ECHO 08/31/21    History of Present Illness:     80 year old male was referred for surgical evaluation of three-vessel coronary artery disease.  Over the summer he presented to the emergency department with signs of cerebrovascular event.  On further work-up he was noted to have a left ventricular thrombus.  He subsequently underwent a left heart cath which showed three-vessel disease.  Regards to his functional status has some residual right-handed weakness and tremors from the stroke.  He is not very active and only exercises about 15 minutes a day.  This is above his previous stroke baseline.  His exercise only includes walking.  He is very hesitant to proceed with any surgical operations.     Past Medical History:  Diagnosis Date   Hypertension     Past Surgical History:  Procedure Laterality Date   APPENDECTOMY     in his 84's   CORONARY ANGIOGRAPHY N/A 10/20/2021   Procedure: CORONARY ANGIOGRAPHY;  Surgeon: Kathleene Hazel, MD;  Location: [facility];  Service: Cardiovascular;  Laterality: N/A;   IR CT HEAD LTD  07/19/2021   IR FLUORO GUIDED NEEDLE PLC ASPIRATION/INJECTION LOC  07/21/2021   IR PERCUTANEOUS ART THROMBECTOMY/INFUSION INTRACRANIAL INC DIAG ANGIO  07/19/2021   RADIOLOGY WITH ANESTHESIA N/A 07/19/2021   Procedure: IR WITH ANESTHESIA;  Surgeon: Julieanne Cotton, MD;  Location: [facility];  Service: Radiology;  Laterality: N/A;    Social History: Support: Lives independently with his wife.  His daughter was present at the  visit.  Social History   Tobacco Use  Smoking Status Never  Smokeless Tobacco Never    Social History   Substance and Sexual Activity  Alcohol Use Not Currently   Alcohol/week: 1.0 standard drink   Types: 1 Cans of beer per week     No Known Allergies    Current Outpatient Medications  Medication Sig Dispense Refill   acetaminophen (TYLENOL) 325 MG tablet Take 1-2 tablets (325-650 mg total) by mouth every 4 (four) hours as needed for mild pain.     aspirin EC 81 MG EC tablet Take 1 tablet (81 mg total) by mouth daily. Swallow whole. 90 tablet 1   atorvastatin (LIPITOR) 80 MG tablet Take 1 tablet (80 mg total) by mouth daily. 90 tablet 3   docusate sodium (COLACE) 100 MG capsule Take 1 capsule (100 mg total) by mouth 2 (two) times daily. (Patient taking differently: Take 100 mg by mouth daily.) 60 capsule 0   melatonin 3 MG TABS tablet Take 0.5 tablets (1.5 mg total) by mouth at bedtime. OVER THE COUNTER 30 tablet 0   metoprolol succinate (TOPROL-XL) 50 MG 24 hr tablet Take 1 tablet (50 mg total) by mouth at bedtime. 90 tablet 3   pantoprazole (PROTONIX) 40 MG tablet TAKE 1 TABLET(40 MG) BY MOUTH AT BEDTIME 90 tablet 0   polyethylene glycol (MIRALAX / GLYCOLAX) 17 g packet Take 17 g by mouth daily. 30 each 0   tamsulosin (  FLOMAX) 0.4 MG CAPS capsule TAKE 1 CAPSULE(0.4 MG) BY MOUTH DAILY 30 capsule 1   tiZANidine (ZANAFLEX) 2 MG tablet TAKE 1 TABLET(2 MG) BY MOUTH AT BEDTIME 30 tablet 1   warfarin (COUMADIN) 6 MG tablet Take 0.5-1 tablets (3-6 mg total) by mouth daily at 4 PM. Take 3 mg on Sunday, Thursday all the other day take 6 mg 72 tablet 1   No current facility-administered medications for this visit.    (Not in a hospital admission)   Family History  Problem Relation Age of Onset   Cancer - Prostate Father      Review of Systems:   Review of Systems  Constitutional:  Negative for malaise/fatigue.  Respiratory:  Negative for cough and shortness of breath.    Cardiovascular:  Negative for chest pain.  Neurological: Negative.      Physical Exam: BP (!) 160/86    Pulse 98    Resp 20    Ht 5\' 6"  (1.676 m)    Wt 172 lb (78 kg)    SpO2 98% Comment: RA   BMI 27.76 kg/m  Physical Exam Constitutional:      Comments: Frail  HENT:     Head: Normocephalic and atraumatic.  Cardiovascular:     Rate and Rhythm: Normal rate.  Pulmonary:     Effort: Pulmonary effort is normal. No respiratory distress.  Abdominal:     General: Abdomen is flat. There is no distension.  Musculoskeletal:        General: Normal range of motion.  Skin:    General: Skin is warm and dry.  Neurological:     General: No focal deficit present.     Mental Status: He is alert and oriented to person, place, and time.      Diagnostic Studies & Laboratory data:    Left Heart Catherization: Intervention   Echo: 1. 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 Apical, apical anterior,  and apical septal akinesis. Inferoseptal hypokinesis. Left ventricular  ejection fraction, by estimation, is 50 to 55%. The left ventricle has low  normal function. The left  ventricle demonstrates regional wall motion abnormalities (see scoring  diagram/findings for description). Left ventricular diastolic parameters  are consistent with Grade I diastolic dysfunction (impaired relaxation).   2. Right ventricular systolic function is normal. The right ventricular  size is normal.   3. The mitral valve is normal in structure. Trivial mitral valve  regurgitation. No evidence of mitral stenosis.   4. The aortic valve is normal in structure. Aortic valve regurgitation is  not visualized. No aortic stenosis is present.   5. The inferior vena cava is normal in size with greater than 50%  respiratory variability, suggesting right atrial pressure of 3 mmHg.   EKG: Sinus I have independently reviewed the above radiologic studies and discussed with the patient   Recent Lab Findings: Lab  Results  Component Value Date   WBC 6.7 10/21/2021   HGB 10.7 (L) 10/21/2021   HCT 34.2 (L) 10/21/2021   PLT 203 10/21/2021   GLUCOSE 88 10/21/2021   CHOL 128 10/13/2021   TRIG 134 10/13/2021   HDL 30 (L) 10/13/2021   LDLCALC 74 10/13/2021   ALT 19 10/13/2021   AST 20 10/13/2021   NA 137 10/21/2021   K 3.8 10/21/2021   CL 109 10/21/2021   CREATININE 1.57 (H) 10/21/2021   BUN 22 10/21/2021   CO2 22 10/21/2021   INR 2.0 12/02/2021   HGBA1C 5.9 (H)  07/20/2021      Assessment / Plan:   80 year old male with severe three-vessel coronary artery disease.  He has a totally occluded LAD which fills from left to left collaterals.  He also has a totally occluded RCA which fills from left to right collaterals.  His diffuse disease in his circumflex system.  He would need to grafts and circumflex system PDA, 1 to the diagonal.  The LAD is heavily calcified.  Additionally on echocardiogram August of this year he was noted to have an apical thrombus.  He will need a repeat echocardiogram to assess for status of apical thrombus.    We had a long discussion about options for treatment of his coronary disease.  He states that he is not having any symptoms and with his exercise.  He also expressed hesitancy in regards to surgical revascularization.  I explained to him that he is at risk of having a major heart attack but given his functional status he would be moderate risk for surgical revascularization.  He and his daughter were both in agreement, also stating that their mother would not be able to care for him after surgery.  At this point he is elected to proceed with medical management.  He will contact us if he changes his mind.     I  spent 55 minutes counseling the patient face to face.   Lajuana Matte 12/02/2021 11:48 AM

## 2021-12-02 NOTE — Patient Instructions (Signed)
Continue taking Warfarin 1 tablet daily except 1/2 tablet on Sundays and Thursdays. Recheck INR in 4 weeks. [facility] [phone];

## 2021-12-06 ENCOUNTER — Ambulatory Visit (HOSPITAL_BASED_OUTPATIENT_CLINIC_OR_DEPARTMENT_OTHER): Payer: Medicare Other | Admitting: Family

## 2021-12-30 ENCOUNTER — Ambulatory Visit (INDEPENDENT_AMBULATORY_CARE_PROVIDER_SITE_OTHER): Payer: Medicare Other

## 2021-12-30 ENCOUNTER — Other Ambulatory Visit: Payer: Self-pay

## 2021-12-30 ENCOUNTER — Other Ambulatory Visit: Payer: Self-pay | Admitting: Physical Medicine & Rehabilitation

## 2021-12-30 DIAGNOSIS — I63512 Cerebral infarction due to unspecified occlusion or stenosis of left middle cerebral artery: Secondary | ICD-10-CM | POA: Diagnosis not present

## 2021-12-30 DIAGNOSIS — Z5181 Encounter for therapeutic drug level monitoring: Secondary | ICD-10-CM

## 2021-12-30 LAB — POCT INR: INR: 1.8 — AB (ref 2.0–3.0)

## 2021-12-30 NOTE — Patient Instructions (Signed)
Description   Today take 1.5 tablets and then START taking Warfarin 1 tablet daily except 1/2 tablet on Sundays. Recheck INR in 2 weeks. [facility]in Clinic [phone]; pt would like Coumadin managed at [facility] site (vicinity).

## 2022-01-08 ENCOUNTER — Encounter: Payer: Self-pay | Admitting: Physical Medicine & Rehabilitation

## 2022-01-08 ENCOUNTER — Other Ambulatory Visit: Payer: Self-pay | Admitting: Physical Medicine & Rehabilitation

## 2022-01-10 ENCOUNTER — Other Ambulatory Visit (HOSPITAL_COMMUNITY): Payer: Medicare Other

## 2022-01-10 MED ORDER — TAMSULOSIN HCL 0.4 MG PO CAPS: ORAL_CAPSULE | ORAL | 0 refills | Status: DC

## 2022-01-12 ENCOUNTER — Ambulatory Visit (HOSPITAL_BASED_OUTPATIENT_CLINIC_OR_DEPARTMENT_OTHER): Payer: Medicare Other | Admitting: Family

## 2022-01-13 ENCOUNTER — Ambulatory Visit (INDEPENDENT_AMBULATORY_CARE_PROVIDER_SITE_OTHER): Payer: Medicare Other

## 2022-01-13 ENCOUNTER — Ambulatory Visit (HOSPITAL_COMMUNITY): Payer: Medicare Other | Attending: Cardiology

## 2022-01-13 ENCOUNTER — Other Ambulatory Visit: Payer: Self-pay

## 2022-01-13 DIAGNOSIS — I63512 Cerebral infarction due to unspecified occlusion or stenosis of left middle cerebral artery: Secondary | ICD-10-CM | POA: Diagnosis not present

## 2022-01-13 DIAGNOSIS — I513 Intracardiac thrombosis, not elsewhere classified: Secondary | ICD-10-CM | POA: Diagnosis present

## 2022-01-13 DIAGNOSIS — I77819 Aortic ectasia, unspecified site: Secondary | ICD-10-CM | POA: Insufficient documentation

## 2022-01-13 DIAGNOSIS — Z5181 Encounter for therapeutic drug level monitoring: Secondary | ICD-10-CM

## 2022-01-13 LAB — POCT INR: INR: 2 (ref 2.0–3.0)

## 2022-01-13 LAB — ECHOCARDIOGRAM COMPLETE
AR max vel: 2.63 cm2
AV Area VTI: 2.54 cm2
AV Area mean vel: 2.51 cm2
AV Mean grad: 4 mmHg
AV Peak grad: 6.9 mmHg
Ao pk vel: 1.31 m/s
Area-P 1/2: 2.69 cm2
S' Lateral: 4.3 cm

## 2022-01-13 MED ORDER — PERFLUTREN LIPID MICROSPHERE
1.0000 mL | INTRAVENOUS | Status: AC | PRN
  Administered 2022-01-13: 3 mL via INTRAVENOUS

## 2022-01-13 MED ORDER — WARFARIN SODIUM 6 MG PO TABS: 3.0000 mg | ORAL_TABLET | Freq: Every day | ORAL | 1 refills | Status: DC

## 2022-01-13 NOTE — Telephone Encounter (Signed)
Prescription refill request received for warfarin Lov: 11/03/21 Dan Humphreys)  Next INR check: 02/03/22 Warfarin tablet strength: 6mg   Appropriate dose and refill sent to requested pharmacy.

## 2022-01-13 NOTE — Patient Instructions (Signed)
Description   Today take 1.5 tablets and then continue taking Warfarin 1 tablet daily except 1/2 tablet on Sundays. Recheck INR in 3 weeks. Coumadin Clinic [phone]; pt would like Coumadin managed at [facility] site (vicinity).

## 2022-01-16 ENCOUNTER — Telehealth (HOSPITAL_BASED_OUTPATIENT_CLINIC_OR_DEPARTMENT_OTHER): Payer: Self-pay

## 2022-01-16 NOTE — Telephone Encounter (Addendum)
Results called to patient's daughter per DPR!   ----- Message from Alver Sorrow, NP sent at 01/16/2022 10:08 AM EST ----- Echocardiogram shows heart pumping function is slightly reduced. Heart mildly stiff and muscle mildly thick which is stable. 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 Aortic dilation noted - recommend repeat echo 1 year for monitoring. Thrombus no longer present but still with slow flow which could cause recurrent thrombus. Recommend continuing anticoagulation. Follow up as scheduled.

## 2022-01-25 ENCOUNTER — Ambulatory Visit (INDEPENDENT_AMBULATORY_CARE_PROVIDER_SITE_OTHER): Payer: Medicare Other | Admitting: Family

## 2022-01-25 ENCOUNTER — Encounter (HOSPITAL_BASED_OUTPATIENT_CLINIC_OR_DEPARTMENT_OTHER): Payer: Self-pay | Admitting: Family

## 2022-01-25 ENCOUNTER — Other Ambulatory Visit: Payer: Self-pay

## 2022-01-25 VITALS — BP 140/76 | HR 73 | Ht 66.0 in | Wt 172.9 lb

## 2022-01-25 DIAGNOSIS — I25118 Atherosclerotic heart disease of native coronary artery with other forms of angina pectoris: Secondary | ICD-10-CM

## 2022-01-25 DIAGNOSIS — I10 Essential (primary) hypertension: Secondary | ICD-10-CM

## 2022-01-25 DIAGNOSIS — Z8673 Personal history of transient ischemic attack (TIA), and cerebral infarction without residual deficits: Secondary | ICD-10-CM

## 2022-01-25 DIAGNOSIS — I513 Intracardiac thrombosis, not elsewhere classified: Secondary | ICD-10-CM

## 2022-01-25 DIAGNOSIS — E785 Hyperlipidemia, unspecified: Secondary | ICD-10-CM

## 2022-01-25 DIAGNOSIS — D6859 Other primary thrombophilia: Secondary | ICD-10-CM

## 2022-01-25 NOTE — Progress Notes (Signed)
Office Visit    Patient Name: Roberto Dickson Date of Encounter: 01/26/2022  PCP:  Horald Pollen, [place]  Cardiologist:  Buford Dresser, MD  Advanced Practice Provider:  No care team member to display Electrophysiologist:  None      Chief Complaint    Roberto Dickson is a 81 y.o. male with a hx of CVA, HTN, HLD, LV thrombus, CAD presents today for follow-up after echocardiogram.   Past Medical History    Past Medical History:  Diagnosis Date   Hypertension    Past Surgical History:  Procedure Laterality Date   APPENDECTOMY     in his 89's   CORONARY ANGIOGRAPHY N/A 10/20/2021   Procedure: CORONARY ANGIOGRAPHY;  Surgeon: Burnell Blanks, MD;  Location: [facility];  Service: Cardiovascular;  Laterality: N/A;   IR CT HEAD LTD  07/19/2021   IR FLUORO GUIDED NEEDLE PLC ASPIRATION/INJECTION LOC  07/21/2021   IR PERCUTANEOUS ART THROMBECTOMY/INFUSION INTRACRANIAL INC DIAG ANGIO  07/19/2021   RADIOLOGY WITH ANESTHESIA N/A 07/19/2021   Procedure: IR WITH ANESTHESIA;  Surgeon: Luanne Bras, MD;  Location: [facility];  Service: Radiology;  Laterality: N/A;    Allergies  No Known Allergies  History of Present Illness    Roberto Dickson is a 81 y.o. male with a hx of CVA, HTN, HLD, LV thrombus, CAD last seen 11/03/21.   He was admitted 07/26/2021 - 08/10/2021 with acute ischemic left middle cerebral artery (MCA) stroke.  He received tPA and additionally underwent cerebral angiography with revascularization of left ICA from proximal to distal terminus and origin of left MCA.  Echocardiogram with LVEF 50 to 55% with apical thrombus and Coumadin started.  There were wall motion abnormalities noted by echocardiogram but this was recommended for outpatient duration.[name]  He was recommended for CIR.  Seen in follow up 08/31/21 with his daughter. Stress test was recommended due to wall motion abnormalities by echo. Stress  test 09/06/21 with large size, severe severity mid to apical inferior mostly fixed perfusion defect suggestive of large RCA territory scar with peri-infarct ischemia. LVEF read as 29% but suspected to be falsely low.  Cardiac CT 10/2022With calcium score 6053 and significant three vessel disease with likely CTO of LAD. Subsequent cardiac cath 10/20/21 with severe triple vessel CAD (CTO mid LAD, distal LAD diffusely diseased filing with L-L collaterals, moderate caliber diagonal branch severe disease, severe disease Cx and first obtuse marginal, CTO dominant RCA with L-R collaterals). PCI not an option. Plan for outpatient CT surgery consult given may not be good candidate for CABG given recent CVA and LV thrombus. He had preserved LVEF.  Seen in follow up 11/2021. Preferred to proceed with medical therapy of coronary disease as he was asymptomatic. He met with [name] of [facility] and decided to proceed with medical management. Repeat echocardiogram 01/13/22 with LVEF 45-50%, RWMA, mild LVH, gr1DD, dilation aortic root 41m and ascending aorta 426m There was swirling artifact at apex suggesting slow flow but previously LV thrombus resolved. Given akinetic apex, recommended to continue anticoagulation.   He presents today for follow up with daughter and interpretor. Feeling well. Reports no shortness of breath nor dyspnea on exertion. Reports no chest pain, pressure, or tightness. No edema, orthopnea, PND. Reports no palpitations.  Did have two mechanical falls since last seen but did not hit his head. We discussed adding safety measure of a cane. We reviewed his echocardiogram in detail and  he is reassured that the thrombus has resolved. He does request to have a glass of [place] is from[place] once every couple of months which is reasonable.   EKGs/Labs/Other Studies Reviewed:   The following studies were reviewed today:  Echo 01/2022  1. Left ventricular ejection fraction, by estimation,  is 45 to 50%. The  left ventricle has mildly decreased function. The left ventricle  demonstrates regional wall motion abnormalities (see scoring  diagram/findings for description). There is mild left  ventricular hypertrophy. Left ventricular diastolic parameters are  consistent with Grade I diastolic dysfunction (impaired relaxation).   2. Right ventricular systolic function is mildly reduced. The right  ventricular size is normal.   3. The mitral valve is normal in structure. Trivial mitral valve  regurgitation. No evidence of mitral stenosis.   4. The aortic valve is tricuspid. Aortic valve regurgitation is mild to  moderate. Aortic valve sclerosis/calcification is present, without any  evidence of aortic stenosis.   5. Aortic dilatation noted. There is dilatation of the aortic root,  measuring 41 mm. There is dilatation of the ascending aorta, measuring 43  mm.   Comparison(s): Compared to prior, there is swirling artifact at apex on  contrast images suggesting slow flow but previously seen LV thrombus is  not present. Apex remains akinetic however, would continue anticoagulation  if tolerating.   Coronary angiography 10/20/21   Mid RCA lesion is 100% stenosed.   Mid Cx to Dist Cx lesion is 90% stenosed.   1st Mrg lesion is 90% stenosed.   Prox Cx to Mid Cx lesion is 80% stenosed.   Mid LAD to Dist LAD lesion is 100% stenosed.   2nd Diag lesion is 90% stenosed.   Ost LAD to Mid LAD lesion is 30% stenosed.   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 The distal LAD is diffusely diseased and fills from left to left collaterals. The moderate caliber diagonal branch has severe disease. Severe disease in the mid and distal AV groove Circumflex. Severe disease in the first obtuse marginal branch Chronic total occlusion of the dominant RCA in the mid segment. The distal vessel fills from left to right collaterals.    Recommendations: He has an LV thrombus by  recent echo and has been on coumadin. He has been bridged with Lovenox for this procedure today. I did not cross into the LV today due to the LV thrombus. He will be admitted post cath and we will start IV heparin 8 hours post sheath pull. I would anticipate that he will be watched overnight and then Lovenox bridging resumed tomorrow if no access site bleeding. Resume coumadin tomorrow. In regards to his CAD, he has severe three vessel CAD with CTO of the RCA and LAD. PCI is not an option. His options include medical management vs CABG. Given advanced age, recent stroke and LV thrombus, he may not be a good candidate for CABG. This would be delayed for several months anyway given LV thrombus and recent stroke. We can plan an outpatient CT surgery consult in several weeks post discharge. Fortunately he has overall preserved LV systolic function and is asymptomatic.    Cardiac CT 09/29/21 IMPRESSION: 1. Coronary calcium score of 6053. This was 99th percentile for age-, sex, and race-matched controls.   2. Normal coronary origin with right dominance.   3. 100% occlusion of the mid LAD that likely a chronic total occlusion with collateral flow filling the vessel distally.  4. Moderate (50-69%) mixed density plaque in the mid LCX.   5. Severe stenosis in the proximal RCA (70-99%) with 100% occlusion versus subtotal occlusion of the mid RCA.   6. The apical myocardium is thinned and aneurysmal consistent with prior LAD infarction.   7. There is a filling defect in the apex (measures 26 mm x 26 mm) consistent with LV thrombus.   RECOMMENDATIONS: 1. Cardiac catheterization is recommended due to concerns for 3-vessel CAD.   2. LV thrombus is known from prior imaging.   Myoview 09/07/21   Findings are consistent with prior myocardial infarction with peri-infarct ischemia. The study is high risk.   No ST deviation was noted.   LV perfusion is abnormal. There is no evidence of ischemia. There is  evidence of infarction. Defect 1: There is a large defect with severe reduction in uptake present in the apical to mid inferior location(s) that is partially reversible. Viability is present. There is abnormal wall motion in the defect area. Consistent with infarction and peri-infarct ischemia.   Left ventricular function is abnormal. Global function is severely reduced. There was a single regional abnormality. Nuclear stress EF: 29 %. The left ventricular ejection fraction is severely decreased (<30%). End diastolic cavity size is mildly enlarged. End systolic cavity size is mildly enlarged.   Prior study not available for comparison.   Large size, severe severity mid to apical inferior mostly fixed perfusion defect, suggestive of large RCA territory scar with peri-infarct ischemia (SDS 5). LVEF 29% with inferior and apical akinesis (suspect LVEF is falsely low, recommend echo correlation). This is a high risk study. No prior for comparison.  Echo 07/20/21  1. 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 Apical, apical anterior,  and apical septal akinesis. Inferoseptal hypokinesis. Left ventricular  ejection fraction, by estimation, is 50 to 55%. The left ventricle has low  normal function. The left  ventricle demonstrates regional wall motion abnormalities (see scoring  diagram/findings for description). Left ventricular diastolic parameters  are consistent with Grade I diastolic dysfunction (impaired relaxation).   2. Right ventricular systolic function is normal. The right ventricular  size is normal.   3. The mitral valve is normal in structure. Trivial mitral valve  regurgitation. No evidence of mitral stenosis.   4. The aortic valve is normal in structure. Aortic valve regurgitation is  not visualized. No aortic stenosis is present.   5. The inferior vena cava is normal in size with greater than 50%  respiratory variability, suggesting right atrial pressure of 3 mmHg.   EKG:No EKG  today.   Recent Labs: 07/21/2021: B Natriuretic Peptide 82.3 10/13/2021: ALT 19 10/21/2021: BUN 22; Creatinine, Ser 1.57; Hemoglobin 10.7; Platelets 203; Potassium 3.8; Sodium 137  Recent Lipid Panel    Component Value Date/Time   CHOL 128 10/13/2021 0000   TRIG 134 10/13/2021 0000   HDL 30 (L) 10/13/2021 0000   CHOLHDL 4.3 10/13/2021 0000   CHOLHDL 6.0 07/20/2021 0536   VLDL 17 07/20/2021 0536   LDLCALC 74 10/13/2021 0000   Home Medications   Current Meds  Medication Sig   acetaminophen (TYLENOL) 325 MG tablet Take 1-2 tablets (325-650 mg total) by mouth every 4 (four) hours as needed for mild pain.   aspirin EC 81 MG EC tablet Take 1 tablet (81 mg total) by mouth daily. Swallow whole.   atorvastatin (LIPITOR) 80 MG tablet Take 1 tablet (80 mg total) by mouth daily.   docusate sodium (COLACE) 100 MG capsule Take  1 capsule (100 mg total) by mouth 2 (two) times daily. (Patient taking differently: Take 100 mg by mouth daily.)   melatonin 3 MG TABS tablet Take 0.5 tablets (1.5 mg total) by mouth at bedtime. OVER THE COUNTER   metoprolol succinate (TOPROL-XL) 50 MG 24 hr tablet Take 1 tablet (50 mg total) by mouth at bedtime.   pantoprazole (PROTONIX) 40 MG tablet TAKE 1 TABLET(40 MG) BY MOUTH AT BEDTIME   polyethylene glycol (MIRALAX / GLYCOLAX) 17 g packet Take 17 g by mouth daily.   tamsulosin (FLOMAX) 0.4 MG CAPS capsule TAKE 1 CAPSULE(0.4 MG) BY MOUTH DAILY   tiZANidine (ZANAFLEX) 2 MG tablet TAKE 1 TABLET(2 MG) BY MOUTH AT BEDTIME   warfarin (COUMADIN) 6 MG tablet Take 0.5-1 tablets (3-6 mg total) by mouth daily at 4 PM. Take 3 mg on Sunday, Thursday all the other day take 6 mg     Review of Systems    All other systems reviewed and are otherwise negative except as noted above.  Physical Exam    VS:  BP 140/76    Pulse 73    Ht '5\' 6"'  (1.676 m)    Wt 172 lb 14.4 oz (78.4 kg)    SpO2 97%    BMI 27.91 kg/m  , BMI Body mass index is 27.91 kg/m.  Wt Readings from Last 3  Encounters:  01/25/22 172 lb 14.4 oz (78.4 kg)  12/02/21 172 lb (78 kg)  11/22/21 172 lb (78 kg)    GEN: Well nourished, well developed, in no acute distress. HEENT: normal. Neck: Supple, no JVD, carotid bruits, or masses. Cardiac: RRR, no murmurs, rubs, or gallops. No clubbing, cyanosis, edema.  Radials/PT 2+ and equal bilaterally.  Respiratory:  Respirations regular and unlabored, clear to auscultation bilaterally. GI: Soft, nontender, nondistended. MS: No deformity or atrophy. Skin: Warm and dry, no rash. R radial catheterization site with no ecchymosis nor signs of infection.  Neuro:  Strength and sensation are intact. Psych: Normal affect.  Assessment & Plan   CAD - Echo 10/2021 with significant 3 vessel CAD not PCI candidate. LVEF preserved. Seen by [facility] and prefers medial management.He is asymptomatic with no chest pain nor dyspnea. His GDMT includes aspirin, metoprolol, atorvastatin. If anginal symptoms develop, could consider Imdur or alternative antianginal agent. Heart healthy diet and regular cardiovascular exercise encouraged.    HFrEF - Echo 01/2022 mildly reduced LVEF 45-50%. Recommend fluid restriction <2L, low salt diet. Continue beta blocker. He is understandably hesitant regarding additional medication therapy as he feels well though could consider ARB in future. No indication for diuretic at this time. NYHA I.  History of CVA-Continue to follow with neurology.  Continue atorvastatin, aspirin. Mild R hand and RLE defecits.   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 Repeat echo 01/2022 with resolution of thrombus but still akinetic apex with slow flow recommended for continuation of Warfarin. Educated to report hematuria, melena. Follows routinely with coumadin clinic.   HTN- BP elevated in clinic. He will monitor three times per week at home for the next two weeks and report. If BP >130/80, plan to transition  Metoprolol to Carvedliol.   HLD, LDL goal less than 70-Continue Atorvastatin 25m daily.   Disposition: Follow up in in 3 months with Dr. CHarrell Gaveor APP.  Signed, CLoel Dubonnet NP 01/26/2022, 9:15 AM [facility]

## 2022-01-25 NOTE — Patient Instructions (Addendum)
Medication Instructions:  Continue your current medications.   *If you need a refill on your cardiac medications before your next appointment, please call your pharmacy*   Lab Work: None ordered today.   Testing/Procedures: None ordered today.   Follow-Up: At [facility], you and your health needs are our priority.  As part of our continuing mission to provide you with exceptional heart care, we have created designated Provider Care Teams.  These Care Teams include your primary Cardiologist (physician) and Advanced Practice Providers (APPs -  Physician Assistants and Nurse Practitioners) who all work together to provide you with the care you need, when you need it.  We recommend signing up for the patient portal called "MyChart".  Sign up information is provided on this After Visit Summary.  MyChart is used to connect with patients for Virtual Visits (Telemedicine).  Patients are able to view lab/test results, encounter notes, upcoming appointments, etc.  Non-urgent messages can be sent to your provider as well.   To learn more about what you can do with MyChart, go to [email or web address].    Your next appointment:   In May as scheduled with Loel Dubonnet, NP    Other Instructions   Tips to Measure your Blood Pressure Correctly  Check three times per week for the next two weeks and send a log via MyChart.   Here's what you can do to ensure a correct reading:  Don't drink a caffeinated beverage or smoke during the 30 minutes before the test.  Sit quietly for five minutes before the test begins.  During the measurement, sit in a chair with your feet on the floor and your arm supported so your elbow is at about heart level.  The inflatable part of the cuff should completely cover at least 80% of your upper arm, and the cuff should be placed on bare skin, not over a shirt.  Don't talk during the measurement.  Blood pressure categories  Blood pressure category  SYSTOLIC (upper number)  DIASTOLIC (lower number)  Normal Less than 120 mm Hg and Less than 80 mm Hg  Elevated 120-129 mm Hg and Less than 80 mm Hg  High blood pressure: Stage 1 hypertension 130-139 mm Hg or 80-89 mm Hg  High blood pressure: Stage 2 hypertension 140 mm Hg or higher or 90 mm Hg or higher  Hypertensive crisis (consult your doctor immediately) Higher than 180 mm Hg and/or Higher than 120 mm Hg  Source: American Heart Association and American Stroke Association. For more on getting your blood pressure under control, buy Controlling Your Blood Pressure, a Special Health Report from [facility].   Blood Pressure Log   Date   Time  Blood Pressure  Example: Nov 1 9 AM 124/78

## 2022-01-26 ENCOUNTER — Encounter (HOSPITAL_BASED_OUTPATIENT_CLINIC_OR_DEPARTMENT_OTHER): Payer: Self-pay | Admitting: Family

## 2022-01-29 ENCOUNTER — Other Ambulatory Visit: Payer: Self-pay | Admitting: Physical Medicine & Rehabilitation

## 2022-01-30 ENCOUNTER — Other Ambulatory Visit: Payer: Self-pay | Admitting: Physical Medicine & Rehabilitation

## 2022-02-03 ENCOUNTER — Ambulatory Visit (INDEPENDENT_AMBULATORY_CARE_PROVIDER_SITE_OTHER): Payer: Medicare Other | Admitting: *Deleted

## 2022-02-03 ENCOUNTER — Other Ambulatory Visit: Payer: Self-pay

## 2022-02-03 DIAGNOSIS — Z5181 Encounter for therapeutic drug level monitoring: Secondary | ICD-10-CM

## 2022-02-03 DIAGNOSIS — I63512 Cerebral infarction due to unspecified occlusion or stenosis of left middle cerebral artery: Secondary | ICD-10-CM

## 2022-02-03 LAB — POCT INR: INR: 1.8 — AB (ref 2.0–3.0)

## 2022-02-03 NOTE — Patient Instructions (Signed)
Description   ?Take 1.5 tablets of warfarin today, then start taking warfarin 1 tablets daily.  Recheck INR in 2 weeks. [facility] [phone].  ?  ? ? ?

## 2022-02-09 ENCOUNTER — Ambulatory Visit (INDEPENDENT_AMBULATORY_CARE_PROVIDER_SITE_OTHER): Payer: Medicare Other | Admitting: Emergency Medicine

## 2022-02-09 ENCOUNTER — Other Ambulatory Visit: Payer: Self-pay

## 2022-02-09 ENCOUNTER — Encounter: Payer: Self-pay | Admitting: Emergency Medicine

## 2022-02-09 VITALS — BP 142/82 | HR 61 | Ht 66.0 in | Wt 170.0 lb

## 2022-02-09 DIAGNOSIS — E785 Hyperlipidemia, unspecified: Secondary | ICD-10-CM | POA: Diagnosis not present

## 2022-02-09 DIAGNOSIS — I513 Intracardiac thrombosis, not elsewhere classified: Secondary | ICD-10-CM

## 2022-02-09 DIAGNOSIS — I63512 Cerebral infarction due to unspecified occlusion or stenosis of left middle cerebral artery: Secondary | ICD-10-CM

## 2022-02-09 DIAGNOSIS — I10 Essential (primary) hypertension: Secondary | ICD-10-CM

## 2022-02-09 DIAGNOSIS — R931 Abnormal findings on diagnostic imaging of heart and coronary circulation: Secondary | ICD-10-CM | POA: Diagnosis not present

## 2022-02-09 MED ORDER — METOPROLOL SUCCINATE ER 50 MG PO TB24: 50.0000 mg | ORAL_TABLET | Freq: Every day | ORAL | 3 refills | Status: DC

## 2022-02-09 MED ORDER — ATORVASTATIN CALCIUM 80 MG PO TABS: 80.0000 mg | ORAL_TABLET | Freq: Every day | ORAL | 3 refills | Status: DC

## 2022-02-09 MED ORDER — TAMSULOSIN HCL 0.4 MG PO CAPS: ORAL_CAPSULE | ORAL | 0 refills | Status: DC

## 2022-02-09 MED ORDER — LOSARTAN POTASSIUM 50 MG PO TABS: 50.0000 mg | ORAL_TABLET | Freq: Every day | ORAL | 3 refills | Status: DC

## 2022-02-09 NOTE — Assessment & Plan Note (Signed)
Elevated blood pressure readings in the office and at home. ?Continue metoprolol succinate 50 mg daily. ?Start losartan 50 mg daily. ?Continue monitoring blood pressure readings at home daily for the next couple weeks and keep a log. ?Dietary approaches to stop hypertension discussed. ?Follow-up in 6 months. ?

## 2022-02-09 NOTE — Progress Notes (Signed)
Roberto Dickson 81 y.o.   Chief Complaint  Patient presents with   Follow-up    Chronic condition, discuss BP.   Medication Refill    Flomax, metoprolol, lipitor    HISTORY OF PRESENT ILLNESS: This is a 81 y.o. male concerned about elevated blood pressure readings at home. Also needs medication refill of Lipitor Flomax and metoprolol Otherwise doing well.  No other complaints or medical concerns today. Here today accompanied by daughter. BP Readings from Last 3 Encounters:  02/09/22 (!) 142/82  01/25/22 140/76  12/02/21 (!) 160/86     Medication Refill Pertinent negatives include no abdominal pain, chest pain, chills, congestion, coughing, fever, headaches, nausea, rash, sore throat or vomiting.    Prior to Admission medications   Medication Sig Start Date End Date Taking? Authorizing Provider  acetaminophen (TYLENOL) 325 MG tablet Take 1-2 tablets (325-650 mg total) by mouth every 4 (four) hours as needed for mild pain. 08/10/21  Yes Love, Ivan Anchors, PA-C  aspirin EC 81 MG EC tablet Take 1 tablet (81 mg total) by mouth daily. Swallow whole. 10/22/21  Yes Cheryln Manly, NP  atorvastatin (LIPITOR) 80 MG tablet Take 1 tablet (80 mg total) by mouth daily. 11/03/21  Yes Loel Dubonnet, NP  docusate sodium (COLACE) 100 MG capsule Take 1 capsule (100 mg total) by mouth 2 (two) times daily. Patient taking differently: Take 100 mg by mouth daily. 08/10/21  Yes Love, Ivan Anchors, PA-C  melatonin 3 MG TABS tablet Take 0.5 tablets (1.5 mg total) by mouth at bedtime. OVER THE COUNTER 08/10/21  Yes Love, Ivan Anchors, PA-C  metoprolol succinate (TOPROL-XL) 50 MG 24 hr tablet Take 1 tablet (50 mg total) by mouth at bedtime. 08/31/21  Yes Loel Dubonnet, NP  pantoprazole (PROTONIX) 40 MG tablet TAKE 1 TABLET(40 MG) BY MOUTH AT BEDTIME 11/30/21  Yes Kirsteins, Luanna Salk, MD  polyethylene glycol (MIRALAX / GLYCOLAX) 17 g packet Take 17 g by mouth daily. 08/11/21  Yes Love, Ivan Anchors, PA-C  tamsulosin  (FLOMAX) 0.4 MG CAPS capsule TAKE 1 CAPSULE(0.4 MG) BY MOUTH DAILY 01/10/22  Yes Kirsteins, Luanna Salk, MD  tiZANidine (ZANAFLEX) 2 MG tablet TAKE 1 TABLET(2 MG) BY MOUTH AT BEDTIME 01/30/22  Yes Kirsteins, Luanna Salk, MD  warfarin (COUMADIN) 6 MG tablet Take 0.5-1 tablets (3-6 mg total) by mouth daily at 4 PM. Take 3 mg on Sunday, Thursday all the other day take 6 mg 01/13/22  Yes Buford Dresser, MD    No Known Allergies  Patient Active Problem List   Diagnosis Date Noted   Recent cerebrovascular accident (CVA) 11/09/2021   Hyperlipidemia 10/21/2021   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   Coronary artery disease of native artery of native heart with stable angina pectoris ([place])    [place], post-stroke 08/25/2021   Abnormality of gait 08/25/2021   Encounter for therapeutic drug monitoring 08/12/2021   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/12/2021   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   AKI (acute kidney injury) ([place])    Sleep disturbance    Acute blood loss anemia    Essential hypertension    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[place]) 07/26/2021   Pressure injury of skin 07/24/2021   Acute ischemic left MCA stroke ([place]) 07/19/2021   Internal carotid artery occlusion, 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    Past Medical History:  Diagnosis Date   Hypertension     Past Surgical History:  Procedure Laterality Date   APPENDECTOMY  in his 48's   CORONARY ANGIOGRAPHY N/A 10/20/2021   Procedure: CORONARY ANGIOGRAPHY;  Surgeon: Kathleene Hazel, MD;  Location: [facility];  Service: Cardiovascular;  Laterality: N/A;   IR CT HEAD LTD  07/19/2021   IR FLUORO GUIDED NEEDLE PLC ASPIRATION/INJECTION LOC  07/21/2021   IR PERCUTANEOUS ART THROMBECTOMY/INFUSION INTRACRANIAL INC DIAG ANGIO  07/19/2021   RADIOLOGY WITH ANESTHESIA N/A 07/19/2021   Procedure: IR WITH ANESTHESIA;  Surgeon: Julieanne Cotton, MD;  Location: [facility] OR;  Service: Radiology;  Laterality: N/A;    Social  History   Socioeconomic History   Marital status: Married    Spouse name: Not on file   Number of children: Not on file   Years of education: Not on file   Highest education level: Not on file  Occupational History   Not on file  Tobacco Use   Smoking status: Never   Smokeless tobacco: Never  Vaping Use   Vaping Use: Never used  Substance and Sexual Activity   Alcohol use: Not Currently    Alcohol/week: 1.0 standard drink    Types: 1 Cans of beer per week   Drug use: Never   Sexual activity: Not on file  Other Topics Concern   Not on file  Social History Narrative   Not on file   Social Determinants of Health   Financial Resource Strain: Not on file  Food Insecurity: Not on file  Transportation Needs: Not on file  Physical Activity: Not on file  Stress: Not on file  Social Connections: Not on file  Intimate Partner Violence: Not on file    Family History  Problem Relation Age of Onset   Cancer - Prostate Father      Review of Systems  Constitutional: Negative.  Negative for chills and fever.  HENT: Negative.  Negative for congestion, nosebleeds and sore throat.   Respiratory: Negative.  Negative for cough, hemoptysis and shortness of breath.   Cardiovascular: Negative.  Negative for chest pain and palpitations.  Gastrointestinal:  Negative for abdominal pain, blood in stool, diarrhea, melena, nausea and vomiting.  Genitourinary: Negative.  Negative for dysuria and hematuria.  Musculoskeletal:  Negative for falls.  Skin: Negative.  Negative for rash.  Neurological:  Negative for dizziness and headaches.  All other systems reviewed and are negative.  Today's Vitals   02/09/22 1608  BP: (!) 142/82  Pulse: 61  SpO2: 98%  Weight: 170 lb (77.1 kg)  Height: 5\' 6"  (1.676 m)   Body mass index is 27.44 kg/m.  Physical Exam Vitals reviewed.  Constitutional:      Appearance: Normal appearance.  HENT:     Head: Normocephalic.  Eyes:     Extraocular  Movements: Extraocular movements intact.     Pupils: Pupils are equal, round, and reactive to light.  Cardiovascular:     Rate and Rhythm: Normal rate and regular rhythm.     Pulses: Normal pulses.     Heart sounds: Normal heart sounds.  Pulmonary:     Effort: Pulmonary effort is normal.     Breath sounds: Normal breath sounds.  Abdominal:     Palpations: Abdomen is soft.     Tenderness: There is no abdominal tenderness.  Musculoskeletal:     Cervical back: No tenderness.  Lymphadenopathy:     Cervical: No cervical adenopathy.  Skin:    General: Skin is warm and dry.     Capillary Refill: Capillary refill takes less than 2 seconds.  Neurological:  General: No focal deficit present.     Mental Status: He is alert and oriented to person, place, and time.  Psychiatric:        Mood and Affect: Mood normal.        Behavior: Behavior normal.     ASSESSMENT & PLAN: A total of 50-minute was spent with the patient and counseling/coordination of care regarding preparing for this visit, review of most recent office visit notes, review of all medications and changes made, review of multiple chronic medical problems under management, review of most recent blood work results, education on nutrition, cardiovascular risk associated with uncontrolled hypertension, prognosis, documentation and need for follow-up.  Problem List Items Addressed This Visit       Cardiovascular and Mediastinum   Essential hypertension - Primary    Elevated blood pressure readings in the office and at home. Continue metoprolol succinate 50 mg daily. Start losartan 50 mg daily. Continue monitoring blood pressure readings at home daily for the next couple weeks and keep a log. Dietary approaches to stop hypertension discussed. Follow-up in 6 months.      Relevant Medications   losartan (COZAAR) 50 MG tablet   metoprolol succinate (TOPROL-XL) 50 MG 24 hr tablet   atorvastatin (LIPITOR) 80 MG tablet   Left  ventricular thrombus   Relevant Medications   losartan (COZAAR) 50 MG tablet   metoprolol succinate (TOPROL-XL) 50 MG 24 hr tablet   atorvastatin (LIPITOR) 80 MG tablet   Other Visit Diagnoses     Regional wall motion abnormality of heart       Relevant Medications   metoprolol succinate (TOPROL-XL) 50 MG 24 hr tablet   Hyperlipidemia LDL goal <70       Relevant Medications   losartan (COZAAR) 50 MG tablet   metoprolol succinate (TOPROL-XL) 50 MG 24 hr tablet   atorvastatin (LIPITOR) 80 MG tablet      Patient Instructions  Hipertensin en los adultos Hypertension, Adult El trmino hipertensin es otra forma de denominar a la presin arterial elevada. La presin arterial elevada fuerza al corazn a trabajar ms para bombear la sangre. Esto puede causar problemas con el paso del [place]. Una lectura de presin arterial est compuesta por 2 nmeros. Hay un nmero superior (sistlico) sobre un nmero inferior (diastlico). Lo ideal es tener la presin arterial por debajo de 120/80. Las elecciones saludables pueden ayudar a Engineer, materials presin arterial, o tal vez necesite medicamentos para bajarla. Cules son las causas? Se desconoce la causa de esta afeccin. Algunas afecciones pueden estar relacionadas con la presin arterial alta. Qu incrementa el riesgo? Fumar. Tener diabetes mellitus tipo 2, colesterol alto, o ambos. No hacer la cantidad suficiente de actividad fsica o ejercicio. Tener sobrepeso. Consumir mucha grasa, azcar, caloras o sal (sodio) en su dieta. Beber alcohol en exceso. Tener una enfermedad renal a largo plazo (crnica). Tener antecedentes familiares de presin arterial alta. Edad. Los riesgos aumentan con la edad. Raza. El riesgo es mayor para las Retail banker. Sexo. Antes de los 45 aos, los hombres corren ms [employer]. Despus de los 65 aos, las mujeres corren ms [employer]. Tener apnea obstructiva del  sueo. Estrs. Cules son los signos o los sntomas? Es posible que la presin arterial alta puede no cause sntomas. La presin arterial muy alta (crisis hipertensiva) puede provocar: Dolor de cabeza. Sensaciones de preocupacin o nerviosismo (ansiedad). Falta de aire. Hemorragia nasal. Sensacin de malestar en el estmago (nuseas). Vmitos. Cambios en la  forma de ver. Dolor muy intenso en el pecho. Convulsiones. Cmo se trata? Esta afeccin se trata haciendo cambios saludables en el estilo de vida, por ejemplo: Consumir alimentos saludables. Hacer ms ejercicio. Beber menos alcohol. El mdico puede recetarle medicamentos si los cambios en el estilo de vida no son suficientes para Child psychotherapist la presin arterial y si: El nmero de arriba est por encima de 130. El nmero de abajo est por encima de 80. Su presin arterial personal ideal puede variar. Siga estas instrucciones en su casa: Comida y bebida  Si se lo dicen, siga el plan de alimentacin de DASH (Dietary Approaches to Stop Hypertension, Maneras de alimentarse para detener la hipertensin). Para seguir este plan: Llene la mitad del plato de cada comida con frutas y verduras. Llene un cuarto del plato de cada comida con cereales integrales. Los cereales integrales incluyen pasta integral, arroz integral y pan integral. Coma y beba productos lcteos con bajo contenido de grasa, como leche descremada o yogur bajo en grasas. Llene un cuarto del plato de cada comida con protenas bajas en grasa (magras). Las protenas bajas en grasa incluyen pescado, pollo sin piel, huevos, frijoles y tofu. Evite consumir carne grasa, carne curada y procesada, o pollo con piel. Evite consumir alimentos prehechos o procesados. Consuma menos de 1500 mg de sal por da. No beba alcohol si: El mdico le indica que no lo haga. Est embarazada, puede estar embarazada o est tratando de [place]. Si bebe alcohol: Limite la cantidad  que bebe a lo siguiente: De 0 a 1 medida por da para las mujeres. De 0 a 2 medidas por da para los hombres. Est atento a la cantidad de alcohol que hay en las bebidas que toma. En los [place], una medida equivale a una botella de cerveza de 12 oz (355 ml), un vaso de vino de 5 oz (148 ml) o un vaso de una bebida alcohlica de alta graduacin de 1 oz (44 ml). Estilo de vida  Trabaje con su mdico para mantenerse en un peso saludable o para perder peso. Pregntele a su mdico cul es el peso recomendable para usted. Haga al menos 30 minutos de ejercicio la [employer] de la [place]. Estos pueden incluir caminar, nadar o andar en bicicleta. Realice al menos 30 minutos de ejercicio que fortalezca sus msculos (ejercicios de resistencia) al menos 3 das a la [place]. Estos pueden incluir levantar pesas o hacer Pilates. No consuma ningn producto que contenga nicotina o tabaco, como cigarrillos, cigarrillos electrnicos y tabaco de Higher education careers adviser. Si necesita ayuda para dejar de fumar, consulte al [employer]. Controle su presin arterial en su casa tal como le indic el mdico. Concurra a todas las visitas de seguimiento como se lo haya indicado el mdico. Esto es importante. Medicamentos [employer] de venta libre y los recetados solamente como se lo haya indicado el mdico. Siga cuidadosamente las indicaciones. No omita las dosis de medicamentos para la presin arterial. Los medicamentos pierden eficacia si omite dosis. El hecho de omitir las dosis tambin [place] el riesgo de otros problemas. Pregntele a su mdico a qu efectos secundarios o reacciones a los Careers information officer. Comunquese con un mdico si: Piensa que tiene [place] reaccin a los medicamentos que est tomando. Tiene dolores de cabeza frecuentes (recurrentes). Se siente mareado. Tiene hinchazn en los tobillos. Tiene problemas de visin. Solicite ayuda inmediatamente si: Siente un dolor de cabeza muy  intenso. Empieza a sentirse desorientado (confundido). Se siente dbil  o adormecido. Siente que va a desmayarse. Tiene un dolor muy intenso en las siguientes zonas: Pecho. Vientre (abdomen). Vomita ms de una vez. Tiene dificultad para respirar. Resumen El trmino hipertensin es otra forma de denominar a la presin arterial elevada. La presin arterial elevada fuerza al corazn a trabajar ms para bombear la sangre. Para la [employer], una presin arterial normal es menor que 120/80. Las decisiones saludables pueden ayudarle a disminuir su presin arterial. Si no puede bajar su presin arterial mediante decisiones saludables, es posible que deba tomar medicamentos. Esta informacin no tiene Marine scientist el consejo del mdico. Asegrese de hacerle al mdico cualquier pregunta que tenga. Document Revised: 09/05/2018 Document Reviewed: 09/05/2018 Elsevier Patient Education  2022 [place], MD [facility]

## 2022-02-09 NOTE — Patient Instructions (Signed)
Hipertensi?n en los adultos ?Hypertension, Adult ?El t?rmino hipertensi?n es otra forma de denominar a la presi?n arterial elevada. La presi?n arterial elevada fuerza al coraz?n a trabajar m?s para bombear la sangre. Esto puede causar problemas con el paso del tiempo. ?Una lectura de presi?n arterial est? compuesta por 2 n?meros. Hay un n?mero superior (sist?lico) sobre un n?mero inferior (diast?lico). Lo ideal es tener la presi?n arterial por debajo de 120/80. Las elecciones saludables pueden ayudar a bajar la presi?n arterial, o tal vez necesite medicamentos para bajarla. ??Cu?les son las causas? ?Se desconoce la causa de esta afecci?n. Algunas afecciones pueden estar relacionadas con la presi?n arterial alta. ??Qu? incrementa el riesgo? ?Fumar. ?Tener diabetes mellitus tipo 2, colesterol alto, o ambos. ?No hacer la cantidad suficiente de actividad f?sica o ejercicio. ?Tener sobrepeso. ?Consumir mucha grasa, az?car, calor?as o sal (sodio) en su dieta. ?Beber alcohol en exceso. ?Tener una enfermedad renal a largo plazo (cr?nica). ?Tener antecedentes familiares de presi?n arterial alta. ?Edad. Los riesgos aumentan con la edad. ?Raza. El riesgo es mayor para las personas afroamericanas. ?Sexo. Antes de los 45 a?os, los hombres corren m?s riesgo que las mujeres. Despu?s de los 65 a?os, las mujeres corren m?s riesgo que los hombres. ?Tener apnea obstructiva del sue?o. ?Estr?s. ??Cu?les son los signos o los s?ntomas? ?Es posible que la presi?n arterial alta puede no cause s?ntomas. La presi?n arterial muy alta (crisis hipertensiva) puede provocar: ?Dolor de cabeza. ?Sensaciones de preocupaci?n o nerviosismo (ansiedad). ?Falta de aire. ?Hemorragia nasal. ?Sensaci?n de malestar en el est?mago (n?useas). ?V?mitos. ?Cambios en la forma de ver. ?Dolor muy intenso en el pecho. ?Convulsiones. ??C?mo se trata? ?Esta afecci?n se trata haciendo cambios saludables en el estilo de vida, por ejemplo: ?Consumir alimentos  saludables. ?Hacer m?s ejercicio. ?Beber menos alcohol. ?El m?dico puede recetarle medicamentos si los cambios en el estilo de vida no son suficientes para lograr controlar la presi?n arterial y si: ?El n?mero de arriba est? por encima de 130. ?El n?mero de abajo est? por encima de 80. ?Su presi?n arterial personal ideal puede variar. ?Siga estas instrucciones en su casa: ?Comida y bebida ? ?Si se lo dicen, siga el plan de alimentaci?n de DASH (Dietary Approaches to Stop Hypertension, Maneras de alimentarse para detener la hipertensi?n). Para seguir este plan: ?Llene la mitad del plato de cada comida con frutas y verduras. ?Llene un cuarto del plato de cada comida con cereales integrales. Los cereales integrales incluyen pasta integral, arroz integral y pan integral. ?Coma y beba productos l?cteos con bajo contenido de grasa, como leche descremada o yogur bajo en grasas. ?Llene un cuarto del plato de cada comida con prote?nas bajas en grasa (magras). Las prote?nas bajas en grasa incluyen pescado, pollo sin piel, huevos, frijoles y tofu. ?Evite consumir carne grasa, carne curada y procesada, o pollo con piel. ?Evite consumir alimentos prehechos o procesados. ?Consuma menos de 1500 mg de sal por d?a. ?No beba alcohol si: ?El m?dico le indica que no lo haga. ?Est? embarazada, puede estar embarazada o est? tratando de quedar embarazada. ?Si bebe alcohol: ?Limite la cantidad que bebe a lo siguiente: ?De 0 a 1 medida por d?a para las mujeres. ?De 0 a 2 medidas por d?a para los hombres. ?Est? atento a la cantidad de alcohol que hay en las bebidas que toma. En los [place], una medida equivale a una botella de cerveza de 12 oz (355 ml), un vaso de vino de 5 oz (148 ml) o un vaso de una bebida alcoh?lica de   alta graduaci?n de 1? oz (44 ml). ?Estilo de vida ? ?Trabaje con su m?dico para mantenerse en un peso saludable o para perder peso. Preg?ntele a su m?dico cu?l es el peso recomendable para usted. ?Haga al menos 30  minutos de ejercicio la mayor?a de los d?as de la semana. Estos pueden incluir caminar, nadar o andar en bicicleta. ?Realice al menos 30 minutos de ejercicio que fortalezca sus m?sculos (ejercicios de resistencia) al menos 3 d?as a la semana. Estos pueden incluir levantar pesas o hacer Pilates. ?No consuma ning?n producto que contenga nicotina o tabaco, como cigarrillos, cigarrillos electr?nicos y tabaco de mascar. Si necesita ayuda para dejar de fumar, consulte al m?dico. ?Controle su presi?n arterial en su casa tal como le indic? el m?dico. ?Concurra a todas las visitas de seguimiento como se lo haya indicado el m?dico. Esto es importante. ?Medicamentos ?Tome los medicamentos de venta libre y los recetados solamente como se lo haya indicado el m?dico. Siga cuidadosamente las indicaciones. ?No omita las dosis de medicamentos para la presi?n arterial. Los medicamentos pierden eficacia si omite dosis. El hecho de omitir las dosis tambi?n aumenta el riesgo de otros problemas. ?Preg?ntele a su m?dico a qu? efectos secundarios o reacciones a los medicamentos debe prestar atenci?n. ?Comun?quese con un m?dico si: ?Piensa que tiene una reacci?n a los medicamentos que est? tomando. ?Tiene dolores de cabeza frecuentes (recurrentes). ?Se siente mareado. ?Tiene hinchaz?n en los tobillos. ?Tiene problemas de visi?n. ?Solicite ayuda inmediatamente si: ?Siente un dolor de cabeza muy intenso. ?Empieza a sentirse desorientado (confundido). ?Se siente d?bil o adormecido. ?Siente que va a desmayarse. ?Tiene un dolor muy intenso en las siguientes zonas: ?Pecho. ?Vientre (abdomen). ?Vomita m?s de una vez. ?Tiene dificultad para respirar. ?Resumen ?El t?rmino hipertensi?n es otra forma de denominar a la presi?n arterial elevada. ?La presi?n arterial elevada fuerza al coraz?n a trabajar m?s para bombear la sangre. ?Para la mayor?a de las personas, una presi?n arterial normal es menor que 120/80. ?Las decisiones saludables pueden ayudarle  a disminuir su presi?n arterial. Si no puede bajar su presi?n arterial mediante decisiones saludables, es posible que deba tomar medicamentos. ?Esta informaci?n no tiene como fin reemplazar el consejo del m?dico. Aseg?rese de hacerle al m?dico cualquier pregunta que tenga. ?Document Revised: 09/05/2018 Document Reviewed: 09/05/2018 ?Elsevier Patient Education ? 2022 Elsevier Inc. ? ?

## 2022-02-17 ENCOUNTER — Other Ambulatory Visit: Payer: Self-pay

## 2022-02-17 ENCOUNTER — Ambulatory Visit (INDEPENDENT_AMBULATORY_CARE_PROVIDER_SITE_OTHER): Payer: Medicare Other | Admitting: *Deleted

## 2022-02-17 DIAGNOSIS — I63512 Cerebral infarction due to unspecified occlusion or stenosis of left middle cerebral artery: Secondary | ICD-10-CM

## 2022-02-17 DIAGNOSIS — I513 Intracardiac thrombosis, not elsewhere classified: Secondary | ICD-10-CM

## 2022-02-17 DIAGNOSIS — Z5181 Encounter for therapeutic drug level monitoring: Secondary | ICD-10-CM

## 2022-02-17 LAB — POCT INR: INR: 2.4 (ref 2.0–3.0)

## 2022-02-17 NOTE — Patient Instructions (Signed)
Description   Continue taking warfarin 1 tablet daily. Recheck INR in 3 weeks. [facility] [phone].      

## 2022-02-24 ENCOUNTER — Other Ambulatory Visit: Payer: Self-pay | Admitting: Physical Medicine & Rehabilitation

## 2022-02-27 ENCOUNTER — Telehealth: Payer: Self-pay | Admitting: Emergency Medicine

## 2022-02-27 ENCOUNTER — Other Ambulatory Visit: Payer: Self-pay | Admitting: Emergency Medicine

## 2022-02-27 MED ORDER — AMLODIPINE BESYLATE 5 MG PO TABS: 5.0000 mg | ORAL_TABLET | Freq: Every day | ORAL | 3 refills | Status: DC

## 2022-02-27 NOTE — Telephone Encounter (Signed)
Called patient daughter to inform her the medication change. New rx sent to pharmacy on file  ?

## 2022-02-27 NOTE — Telephone Encounter (Signed)
Pts daughter states pt began taking losartan (COZAAR) 50 MG tablet on 3-11, pt begin experiencing hives and headaches on 3-17. Pt discontinued the medication on 3-23 ? ?Caller states pt has had similar reactions in the past to the sane medication ? ?Caller inquiring if there is an alternative that can be prescribed  ?

## 2022-02-27 NOTE — Telephone Encounter (Signed)
Good idea to stop losartan as it seems he is allergic to it.  We will start amlodipine 5 mg daily.  New prescription sent to pharmacy of record.  Thank you.

## 2022-02-28 ENCOUNTER — Emergency Department (HOSPITAL_COMMUNITY)
Admission: EM | Admit: 2022-02-28 | Discharge: 2022-02-28 | Disposition: A | Discharge status: Home / Self Care | Payer: Medicare Other | Attending: Emergency Medicine | Admitting: Emergency Medicine

## 2022-02-28 ENCOUNTER — Other Ambulatory Visit: Payer: Self-pay

## 2022-02-28 ENCOUNTER — Encounter (HOSPITAL_COMMUNITY): Payer: Self-pay | Admitting: Pharmacy Technician

## 2022-02-28 DIAGNOSIS — Z7901 Long term (current) use of anticoagulants: Secondary | ICD-10-CM | POA: Insufficient documentation

## 2022-02-28 DIAGNOSIS — I10 Essential (primary) hypertension: Secondary | ICD-10-CM | POA: Diagnosis not present

## 2022-02-28 DIAGNOSIS — I2510 Atherosclerotic heart disease of native coronary artery without angina pectoris: Secondary | ICD-10-CM | POA: Insufficient documentation

## 2022-02-28 DIAGNOSIS — R21 Rash and other nonspecific skin eruption: Secondary | ICD-10-CM | POA: Diagnosis present

## 2022-02-28 DIAGNOSIS — B028 Zoster with other complications: Secondary | ICD-10-CM | POA: Diagnosis not present

## 2022-02-28 DIAGNOSIS — Z79899 Other long term (current) drug therapy: Secondary | ICD-10-CM | POA: Insufficient documentation

## 2022-02-28 DIAGNOSIS — M542 Cervicalgia: Secondary | ICD-10-CM | POA: Diagnosis not present

## 2022-02-28 DIAGNOSIS — B029 Zoster without complications: Secondary | ICD-10-CM

## 2022-02-28 MED ORDER — VALACYCLOVIR HCL 1 G PO TABS: 1000.0000 mg | ORAL_TABLET | Freq: Two times a day (BID) | ORAL | 0 refills | Status: AC

## 2022-02-28 NOTE — ED Notes (Signed)
DC instructions reviewed with pt. PT verbalized understanding. Pt DC °

## 2022-02-28 NOTE — ED Provider Notes (Signed)
?Roberto Dickson ?Provider Note ? ? ?CSN: [id number] ?Arrival date & time: 02/28/22  1519 ? ?  ? ?History ? ?Chief Complaint  ?Patient presents with  ? Rash  ? ? ?Roberto Dickson is a 81 y.o. male. ? ?The history is provided by the patient and medical records. The history is limited by a language barrier. Language interpreter used: family interpreted.  ?Rash ?Location:  Shoulder/arm and head/neck ?Head/neck rash location:  L neck ?Shoulder/arm rash location:  L shoulder ?Quality: blistering, burning and redness   ?Severity:  Severe ?Onset quality:  Gradual ?Duration:  5 days ?Timing:  Constant ?Progression:  Worsening ?Chronicity:  New ?Relieved by:  Nothing ?Worsened by:  Nothing ?Ineffective treatments:  None tried ?Associated symptoms: no abdominal pain, no diarrhea, no fatigue, no fever, no headaches, no nausea, no shortness of breath, not vomiting and not wheezing   ? ?  ? ?Home Medications ?Prior to Admission medications   ?Medication Sig Start Date End Date Taking? Authorizing Provider  ?acetaminophen (TYLENOL) 325 MG tablet Take 1-2 tablets (325-650 mg total) by mouth every 4 (four) hours as needed for mild pain. 08/10/21   Love, Ivan Anchors, PA-C  ?amLODipine (NORVASC) 5 MG tablet Take 1 tablet (5 mg total) by mouth daily. 02/27/22   Horald Pollen, MD  ?aspirin EC 81 MG EC tablet Take 1 tablet (81 mg total) by mouth daily. Swallow whole. 10/22/21   Cheryln Manly, NP  ?atorvastatin (LIPITOR) 80 MG tablet Take 1 tablet (80 mg total) by mouth daily. 02/09/22   Horald Pollen, MD  ?docusate sodium (COLACE) 100 MG capsule Take 1 capsule (100 mg total) by mouth 2 (two) times daily. ?Patient taking differently: Take 100 mg by mouth daily. 08/10/21   Love, Ivan Anchors, PA-C  ?melatonin 3 MG TABS tablet Take 0.5 tablets (1.5 mg total) by mouth at bedtime. OVER THE COUNTER 08/10/21   Love, Ivan Anchors, PA-C  ?metoprolol succinate (TOPROL-XL) 50 MG 24 hr tablet Take 1 tablet (50 mg  total) by mouth at bedtime. 02/09/22   Horald Pollen, MD  ?pantoprazole (PROTONIX) 40 MG tablet TAKE 1 TABLET(40 MG) BY MOUTH AT BEDTIME 11/30/21   Kirsteins, Luanna Salk, MD  ?polyethylene glycol (MIRALAX / GLYCOLAX) 17 g packet Take 17 g by mouth daily. 08/11/21   Bary Leriche, PA-C  ?tamsulosin (FLOMAX) 0.4 MG CAPS capsule TAKE 1 CAPSULE(0.4 MG) BY MOUTH DAILY 02/09/22   Horald Pollen, MD  ?tiZANidine (ZANAFLEX) 2 MG tablet TAKE 1 TABLET(2 MG) BY MOUTH AT BEDTIME 01/30/22   Kirsteins, Luanna Salk, MD  ?warfarin (COUMADIN) 6 MG tablet Take 0.5-1 tablets (3-6 mg total) by mouth daily at 4 PM. Take 3 mg on Sunday, Thursday all the other day take 6 mg 01/13/22   Buford Dresser, MD  ?   ? ?Allergies    ?Patient has no known allergies.   ? ?Review of Systems   ?Review of Systems  ?Constitutional:  Negative for chills, fatigue and fever.  ?HENT:  Negative for congestion.   ?Respiratory:  Negative for cough, chest tightness, shortness of breath and wheezing.   ?Cardiovascular:  Negative for chest pain.  ?Gastrointestinal:  Negative for abdominal pain, constipation, diarrhea, nausea and vomiting.  ?Genitourinary:  Negative for flank pain.  ?Musculoskeletal:  Positive for neck pain. Negative for back pain and neck stiffness.  ?Skin:  Positive for rash. Negative for wound.  ?Neurological:  Negative for weakness, light-headedness, numbness and headaches.  ?Psychiatric/Behavioral:  Negative for agitation.   ?All other systems reviewed and are negative. ? ?Physical Exam ?Updated Vital Signs ?BP (!) 158/94 (BP Location: Right Arm)   Pulse 82   Temp 99.1 ?F (37.3 ?C) (Oral)   Resp 16   SpO2 97%  ?Physical Exam ?Vitals and nursing note reviewed.  ?Constitutional:   ?   General: He is not in acute distress. ?   Appearance: He is well-developed. He is not ill-appearing, toxic-appearing or diaphoretic.  ?HENT:  ?   Head: Normocephalic and atraumatic.  ?Eyes:  ?   Conjunctiva/sclera: Conjunctivae normal.  ?Neck:   ? ?Cardiovascular:  ?   Rate and Rhythm: Normal rate and regular rhythm.  ?   Heart sounds: No murmur heard. ?Pulmonary:  ?   Effort: Pulmonary effort is normal. No respiratory distress.  ?   Breath sounds: Normal breath sounds. No wheezing, rhonchi or rales.  ?Chest:  ?   Chest wall: No tenderness.  ?Abdominal:  ?   Palpations: Abdomen is soft.  ?   Tenderness: There is no abdominal tenderness. There is no right CVA tenderness, left CVA tenderness, guarding or rebound.  ?Musculoskeletal:     ?   General: Tenderness present. No swelling.  ?   Cervical back: Neck supple.  ?   Right lower leg: No edema.  ?   Left lower leg: No edema.  ?Skin: ?   General: Skin is warm and dry.  ?   Capillary Refill: Capillary refill takes less than 2 seconds.  ?   Findings: Erythema and rash present.  ?Neurological:  ?   General: No focal deficit present.  ?   Mental Status: He is alert.  ?   Sensory: No sensory deficit.  ?   Motor: No weakness.  ?Psychiatric:     ?   Mood and Affect: Mood normal.  ? ? ? ? ? ? ? ?ED Results / Procedures / Treatments   ?Labs ?(all labs ordered are listed, but only abnormal results are displayed) ?Labs Reviewed - No data to display ? ?EKG ?None ? ?Radiology ?No results found. ? ?Procedures ?Procedures  ? ? ?Medications Ordered in ED ?Medications - No data to display ? ?ED Course/ Medical Decision Making/ A&P ?  ?                        ?Medical Decision Making ? ?[name] is a 81 y.o. 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  According to the patient and family, approximately 5 days ago, patient started having some discomfort followed by rash erupting on his left neck.  He initially attributed to a new medication, losartan.  He then stopped the medication when he started feeling the discomfort and seeing the rash and PCP switched him to amlodipine yesterday.  He  reports he has not yet taken that medication and blood pressure is more elevated in the 150s.  He says that the pain is burning and sharp and has been spreading to the entirety of his left neck.  He reports the pain is an 8 out of 10 in severity.  He denies any headache, vision changes, or neurologic complaints.  Denies any numbness, ting, or weakness of extremities.  Denies any fevers, chills, anterior chest pain, shortness of breath, palpitations.  Denies other complaints.  Has been trying some topical creams without relief.  Due to the worsening rash that he present for evaluation.  Family denies any rash on the face but have noticed he had a small red dot on his left earlobe.  No rash reported near the eyes or inside the ear or nose.  No other complaints today. ? ?On exam, patient does have what appears to be a shingle like a rash on his left neck, left upper chest, and does have 1 small area on the left ear.  See clinical photo.  No rash seen inside the ear, nose, or rest of the face.  Pupils symmetric and reactive, extraocular movements.  He denies any double vision or blurry vision or vision changes.  Exam otherwise unremarkable with clear breath sounds and nontender chest or back in the area that did not have rash.  Patient has visible vesicles with surrounding erythema.  Exam otherwise unremarkable. ? ?Clinically I do suspect patient's rash.  As it has been evolving with new areas developing over the last 72 hours, I do feel dedicated to start antiviral medication.  I spoke with pharmacy who reviewed medications and reports there is no serious interaction or contraindication to medication.  Pharmacy also agrees that steroids are not indicated at this time.  Patient is not immune suppressed. ? ?We will give prescription for valacyclovir.  Patient will call and follow-up with his PCP and understands to watch for any rash developing on the rest of the face.  Patient family reports that he has stronger pain  medicine at home to use and does not any other medications.  We also discussed Lidoderm patches but they did not feel that would help.   ? ?We discussed that we felt this was less likely related to the medication

## 2022-02-28 NOTE — ED Provider Triage Note (Signed)
Emergency Medicine Provider Triage Evaluation Note ? ?Rito Ehrlich , a 81 y.o. male  was evaluated in triage.  Pt complains of rash.  Rash started on Friday.  At first it was just redness that showed up on his left side of his chest, upper back, and neck.  Is now blistered.  Rash is painful to the touch. Describes it as burning sensation. He says he recently started losartan, however he stopped this when the rash appeared and the rash has since gotten worse.  Denies any fevers or chills. ? ?Review of Systems  ?Positive: rash ?Negative:  ? ?Physical Exam  ?BP (!) 158/94 (BP Location: Right Arm)   Pulse 82   Temp 99.1 ?F (37.3 ?C) (Oral)   Resp 16   SpO2 97%  ?Gen:   Awake, no distress   ?Resp:  Normal effort  ?MSK:   Moves extremities without difficulty  ?Other:  Rashes in the left anterior chest, left upper back, extends into the left neck, external ear, and temporal scalp region.  I looked inside of the ear and could not see any lesions inside.  The rash is vesicular with an erythematous base. ? ?Medical Decision Making  ?Medically screening exam initiated at 3:48 PM.  Appropriate orders placed.  WILIAM CAUTHORN was informed that the remainder of the evaluation will be completed by another provider, this initial triage assessment does not replace that evaluation, and the importance of remaining in the ED until their evaluation is complete. ? ? ?  ?Claudie Leach, PA-C ?02/28/22 1550 ? ?

## 2022-02-28 NOTE — Discharge Instructions (Signed)
Your history, exam, evaluation today are consistent with a shingles outbreak on the left neck and upper torso.  Based on the timeline with that still developing we do feel it appropriate to provide antiviral medication prescription.  I spoke with pharmacy who reviewed your home medications and feel the valacyclovir is safe for you.  He also recommended the dosing based on your previous kidney function last year to be safe for you.  Please follow-up with your primary doctor and use your home pain medicine as we discussed.  Please rest and stay hydrated.  If any symptoms are to change or worsen, please return to the nearest emergency department. ?

## 2022-02-28 NOTE — ED Triage Notes (Signed)
Pt here with itching/burning rash to L chest, L neck and L upper back. Pt recently taking new BP medication and rash started 5 days later. Family also states pt with constipation.  ?

## 2022-03-01 ENCOUNTER — Telehealth: Payer: Self-pay

## 2022-03-01 ENCOUNTER — Encounter (HOSPITAL_COMMUNITY): Payer: Self-pay

## 2022-03-01 ENCOUNTER — Other Ambulatory Visit: Payer: Self-pay

## 2022-03-01 ENCOUNTER — Emergency Department (HOSPITAL_COMMUNITY): Payer: Medicare Other

## 2022-03-01 ENCOUNTER — Emergency Department (HOSPITAL_COMMUNITY)
Admission: EM | Admit: 2022-03-01 | Discharge: 2022-03-02 | Disposition: A | Discharge status: Home / Self Care | Payer: Medicare Other | Attending: Emergency Medicine | Admitting: Emergency Medicine

## 2022-03-01 DIAGNOSIS — R410 Disorientation, unspecified: Secondary | ICD-10-CM | POA: Diagnosis present

## 2022-03-01 DIAGNOSIS — G459 Transient cerebral ischemic attack, unspecified: Secondary | ICD-10-CM | POA: Insufficient documentation

## 2022-03-01 DIAGNOSIS — Z7901 Long term (current) use of anticoagulants: Secondary | ICD-10-CM | POA: Diagnosis not present

## 2022-03-01 DIAGNOSIS — Z7982 Long term (current) use of aspirin: Secondary | ICD-10-CM | POA: Diagnosis not present

## 2022-03-01 DIAGNOSIS — I10 Essential (primary) hypertension: Secondary | ICD-10-CM | POA: Insufficient documentation

## 2022-03-01 DIAGNOSIS — R4701 Aphasia: Secondary | ICD-10-CM | POA: Diagnosis not present

## 2022-03-01 DIAGNOSIS — R4182 Altered mental status, unspecified: Secondary | ICD-10-CM

## 2022-03-01 DIAGNOSIS — Z79899 Other long term (current) drug therapy: Secondary | ICD-10-CM | POA: Diagnosis not present

## 2022-03-01 DIAGNOSIS — N289 Disorder of kidney and ureter, unspecified: Secondary | ICD-10-CM

## 2022-03-01 LAB — BASIC METABOLIC PANEL
Anion gap: 8 (ref 5–15)
BUN: 29 mg/dL — ABNORMAL HIGH (ref 8–23)
CO2: 21 mmol/L — ABNORMAL LOW (ref 22–32)
Calcium: 8.5 mg/dL — ABNORMAL LOW (ref 8.9–10.3)
Chloride: 106 mmol/L (ref 98–111)
Creatinine, Ser: 1.78 mg/dL — ABNORMAL HIGH (ref 0.61–1.24)
GFR, Estimated: 38 mL/min — ABNORMAL LOW (ref >60)
Glucose, Bld: 125 mg/dL — ABNORMAL HIGH (ref 70–99)
Potassium: 3.9 mmol/L (ref 3.5–5.1)
Sodium: 135 mmol/L (ref 135–145)

## 2022-03-01 LAB — TROPONIN I (HIGH SENSITIVITY)
Troponin I (High Sensitivity): 15 ng/L (ref <18)
Troponin I (High Sensitivity): 14 ng/L (ref <18)

## 2022-03-01 LAB — CBC WITH DIFFERENTIAL/PLATELET
Abs Immature Granulocytes: 0 10*3/uL (ref 0.00–0.07)
Basophils Absolute: 0.1 10*3/uL (ref 0.0–0.1)
Basophils Relative: 1 %
Eosinophils Absolute: 0.1 10*3/uL (ref 0.0–0.5)
Eosinophils Relative: 1 %
HCT: 40.2 % (ref 39.0–52.0)
Hemoglobin: 13.2 g/dL (ref 13.0–17.0)
Lymphocytes Relative: 12 %
Lymphs Abs: 0.8 10*3/uL (ref 0.7–4.0)
MCH: 31 pg (ref 26.0–34.0)
MCHC: 32.8 g/dL (ref 30.0–36.0)
MCV: 94.4 fL (ref 80.0–100.0)
Monocytes Absolute: 1 10*3/uL (ref 0.1–1.0)
Monocytes Relative: 15 %
Neutro Abs: 4.5 10*3/uL (ref 1.7–7.7)
Neutrophils Relative %: 71 %
Platelets: 164 10*3/uL (ref 150–400)
RBC: 4.26 MIL/uL (ref 4.22–5.81)
RDW: 14.9 % (ref 11.5–15.5)
WBC: 6.4 10*3/uL (ref 4.0–10.5)
nRBC: 0 % (ref 0.0–0.2)
nRBC: 0 /100 WBC

## 2022-03-01 LAB — URINALYSIS, ROUTINE W REFLEX MICROSCOPIC
Bilirubin Urine: NEGATIVE
Glucose, UA: NEGATIVE mg/dL
Ketones, ur: NEGATIVE mg/dL
Nitrite: NEGATIVE
Protein, ur: 30 mg/dL — AB
Specific Gravity, Urine: 1.02 (ref 1.005–1.030)
pH: 6.5 (ref 5.0–8.0)

## 2022-03-01 LAB — CK: Total CK: 77 U/L (ref 49–397)

## 2022-03-01 LAB — PROTIME-INR
INR: 1.9 — ABNORMAL HIGH (ref 0.8–1.2)
Prothrombin Time: 21.8 seconds — ABNORMAL HIGH (ref 11.4–15.2)

## 2022-03-01 LAB — LACTIC ACID, PLASMA: Lactic Acid, Venous: 1.4 mmol/L (ref 0.5–1.9)

## 2022-03-01 LAB — URINALYSIS, MICROSCOPIC (REFLEX)

## 2022-03-01 LAB — CBG MONITORING, ED: Glucose-Capillary: 86 mg/dL (ref 70–99)

## 2022-03-01 MED ORDER — GADOBUTROL 1 MMOL/ML IV SOLN
8.5000 mL | Freq: Once | INTRAVENOUS | Status: AC | PRN
  Administered 2022-03-01: 8.5 mL via INTRAVENOUS

## 2022-03-01 NOTE — ED Provider Notes (Signed)
Care assumed from Dr. Pearline Cables, patient with possible seizure, possible TIA and negative work-up pending neurology consultation. ? ?Neurology consultation is appreciated.  He will arrange for outpatient EEG.  In the meantime, patient is getting maximum treatment for cerebrovascular disease, no indication for inpatient care.  Orthostatic vital signs were checked showing no drop in blood pressure although heart rate did increase.  Patient is encouraged to drink fluids.  Return precautions discussed. ?  ?Delora Fuel, MD ?[id number] ? ?

## 2022-03-01 NOTE — Telephone Encounter (Signed)
Pt daughter Byrd Hesselbach called to report that while the pt was finishing up the shower with the help of his wife; pt became unresponsive couldn't talk or stand.  ? ?EMS was called but was the ok when they got there and declined going to the HOS.  ? ?I reached out to the CMA and she advised that he go to the ER. I reported that to [facility] and she was going to transport him there. ? ?FYI ?

## 2022-03-01 NOTE — Discharge Instructions (Addendum)
Aseg?rese de beber suficientes l?quidos. ? ?Regrese si sus s?ntomas empeoran. ? ? ?

## 2022-03-01 NOTE — Telephone Encounter (Signed)
Thank you.  I agree with recommendation to go to emergency room for further evaluation.

## 2022-03-01 NOTE — ED Notes (Signed)
Family reports that the pt is no longer confused. The confusion episode happened only this morning when he was trying to communicate with his wife. He knew what he was trying to say but unable to get the words out. No changes in balance, pt current NIH score is 0. Pt reports pain in the back of his head and he has a swollen cyst on the right side of his neck. Denies numbness, n/v/headache.  ?

## 2022-03-01 NOTE — ED Provider Notes (Signed)
?[facility] EMERGENCY DEPARTMENT ?Provider Note ? ? ?CSN: [id number] ?Arrival date & time: 03/01/22  1259 ? ?  ? ?History ? ?Chief Complaint  ?Patient presents with  ? Altered Mental Status  ? ? ?Roberto Dickson is a 81 y.o. male. ? ?This is a 82 y.o. male  with significant medical history as below, including HTN, CVA who presents to the ED with complaint of aphasia, ams. ? 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 ?Family bedside reports around 830 o'clock this morning patient began acting abnormally, he was not speaking, not focusing on family standing in front of him, refused to move, "staring off" to the corner of the room. Symptoms lasted around 1 hour and resolved entirely spontaneously. Similar to prior CVA in august. He is compliant with is warfarin, no falls. No reported seizure activity. No facial droop or drooling, no falls. Symptoms have not re-occurred since the initial episode this AM. Normal state of health when he went to bed last night.  ? ?Patient reports that his memory of the event is unclear.  He had the urge to speak but was unable to do so.  Currently asymptomatic ? ? ?Of note patient with zoster rash to his left torso and left neck.  Seen yesterday started on Valtrex.  Family feels symptoms have improved since the onset.  There is inclusion of his left ear.  Patient and family feels that the rash is improved, symptoms improved since the last 24 hours regarding his rash ? ?Past Medical History: ?No date: Hypertension ? ?Past Surgical History: ?No date: APPENDECTOMY ?    Comment:  in his 20's ?10/20/2021: CORONARY ANGIOGRAPHY; N/A ?    Comment:  Procedure: CORONARY ANGIOGRAPHY;  Surgeon: Clifton James,  ?             Nile Dear, MD;  Location: [facility] INVASIVE CV LAB;   ?             Service: Cardiovascular;  Laterality: N/A; ?07/19/2021: IR CT HEAD LTD ?07/21/2021: IR FLUORO GUIDED NEEDLE PLC  ASPIRATION/INJECTION LOC ?07/19/2021: IR PERCUTANEOUS ART THROMBECTOMY/INFUSION INTRACRANIAL  ?INC DIAG ANGIO ?07/19/2021: RADIOLOGY WITH ANESTHESIA; N/A ?    Comment:  Procedure: IR WITH ANESTHESIA;  Surgeon: Corliss Skains,  ?             Simonne Maffucci, MD;  Location: [facility] OR;  Service: Radiology;   ?             Laterality: N/A;  ? ? ?The history is provided by the patient and a relative. The history is limited by a language barrier. A language interpreter was used.  ?Altered Mental Status ?Presenting symptoms: partial responsiveness   ?Associated symptoms: rash   ?Associated symptoms: no abdominal pain, no agitation, no fever, no headaches, no nausea, no palpitations and no vomiting   ? ?  ? ?Home Medications ?Prior to Admission medications   ?Medication Sig Start Date End Date Taking? Authorizing Provider  ?acetaminophen (TYLENOL) 325 MG tablet Take 1-2 tablets (325-650 mg total) by mouth every 4 (four) hours as needed for mild pain. 08/10/21   Love, Evlyn Kanner, PA-C  ?amLODipine (NORVASC) 5 MG tablet Take 1 tablet (5 mg total) by mouth daily. 02/27/22   [name], MD  ?aspirin EC 81 MG EC tablet Take 1 tablet (81 mg total) by mouth daily. Swallow whole. 10/22/21   Arty Baumgartner, NP  ?atorvastatin (LIPITOR) 80 MG tablet Take  1 tablet (80 mg total) by mouth daily. 02/09/22   [name], MD  ?docusate sodium (COLACE) 100 MG capsule Take 1 capsule (100 mg total) by mouth 2 (two) times daily. ?Patient taking differently: Take 100 mg by mouth daily. 08/10/21   Love, Evlyn Kanner, PA-C  ?melatonin 3 MG TABS tablet Take 0.5 tablets (1.5 mg total) by mouth at bedtime. OVER THE COUNTER 08/10/21   Love, Evlyn Kanner, PA-C  ?metoprolol succinate (TOPROL-XL) 50 MG 24 hr tablet Take 1 tablet (50 mg total) by mouth at bedtime. 02/09/22   [name], MD  ?pantoprazole (PROTONIX) 40 MG tablet TAKE 1 TABLET(40 MG) BY MOUTH AT BEDTIME 11/30/21   Kirsteins, Victorino Sparrow, MD  ?polyethylene glycol (MIRALAX / GLYCOLAX) 17 g packet  Take 17 g by mouth daily. 08/11/21   Jacquelynn Cree, PA-C  ?tamsulosin (FLOMAX) 0.4 MG CAPS capsule TAKE 1 CAPSULE(0.4 MG) BY MOUTH DAILY 02/09/22   [name], MD  ?tiZANidine (ZANAFLEX) 2 MG tablet TAKE 1 TABLET(2 MG) BY MOUTH AT BEDTIME 01/30/22   Kirsteins, Victorino Sparrow, MD  ?valACYclovir (VALTREX) 1000 MG tablet Take 1 tablet (1,000 mg total) by mouth 2 (two) times daily for 10 days. 02/28/22 03/10/22  Tegeler, Canary Brim, MD  ?warfarin (COUMADIN) 6 MG tablet Take 0.5-1 tablets (3-6 mg total) by mouth daily at 4 PM. Take 3 mg on Sunday, Thursday all the other day take 6 mg 01/13/22   Jodelle Red, MD  ?   ? ?Allergies    ?Patient has no known allergies.   ? ?Review of Systems   ?Review of Systems  ?Constitutional:  Negative for chills and fever.  ?HENT:  Negative for facial swelling and trouble swallowing.   ?Eyes:  Negative for photophobia and visual disturbance.  ?Respiratory:  Negative for cough and shortness of breath.   ?Cardiovascular:  Negative for chest pain and palpitations.  ?Gastrointestinal:  Negative for abdominal pain, nausea and vomiting.  ?Endocrine: Negative for polydipsia and polyuria.  ?Genitourinary:  Negative for difficulty urinating and hematuria.  ?Musculoskeletal:  Negative for gait problem and joint swelling.  ?Skin:  Positive for rash. Negative for pallor.  ?Neurological:  Positive for speech difficulty. Negative for syncope, facial asymmetry and headaches.  ?Psychiatric/Behavioral:  Negative for agitation.   ? ?Physical Exam ?Updated Vital Signs ?BP (!) 153/115   Pulse 95   Temp 98.9 ?F (37.2 ?C) (Oral)   Resp 20   Ht 5\' 6"  (1.676 m)   Wt 77.1 kg   SpO2 92%   BMI 27.44 kg/m?  ?Physical Exam ?Vitals and nursing note reviewed.  ?Constitutional:   ?   General: He is not in acute distress. ?   Appearance: Normal appearance. He is well-developed.  ?HENT:  ?   Head: Normocephalic and atraumatic.  ?   Jaw: There is normal jaw occlusion.  ? ?   Comments: Vesicular rash ?    Right Ear: External ear normal.  ?   Left Ear: External ear normal.  ?   Ears:  ?   Comments: The left pinna is inflamed, erythematous; no vesicular lesions noted in the ear canal or on the TM. ?   Mouth/Throat:  ?   Mouth: Mucous membranes are moist.  ?Eyes:  ?   General: No scleral icterus. ?   Extraocular Movements: Extraocular movements intact.  ?   Pupils: Pupils are equal, round, and reactive to light.  ?Cardiovascular:  ?   Rate and Rhythm: Normal rate and  regular rhythm.  ?   Pulses: Normal pulses.  ?   Heart sounds: Normal heart sounds.  ?Pulmonary:  ?   Effort: Pulmonary effort is normal. No respiratory distress.  ?   Breath sounds: Normal breath sounds.  ?Abdominal:  ?   General: Abdomen is flat.  ?   Palpations: Abdomen is soft.  ?   Tenderness: There is no abdominal tenderness.  ?Musculoskeletal:     ?   General: Normal range of motion.  ?   Cervical back: Normal range of motion.  ?   Right lower leg: No edema.  ?   Left lower leg: No edema.  ?Skin: ?   General: Skin is warm and dry.  ?   Capillary Refill: Capillary refill takes less than 2 seconds.  ?Neurological:  ?   Mental Status: He is alert and oriented to person, place, and time.  ?   GCS: GCS eye subscore is 4. GCS verbal subscore is 5. GCS motor subscore is 6.  ?   Cranial Nerves: Cranial nerves 2-12 are intact. No dysarthria or facial asymmetry.  ?   Sensory: Sensation is intact.  ?   Motor: Motor function is intact. No pronator drift.  ?   Coordination: Coordination is intact. Finger-Nose-Finger Test normal.  ?   Gait: Gait is intact.  ?   Comments: Speech is at baseline currently per family at bedside  ?Psychiatric:     ?   Mood and Affect: Mood normal.     ?   Behavior: Behavior normal.  ? ? ?ED Results / Procedures / Treatments   ?Labs ?(all labs ordered are listed, but only abnormal results are displayed) ?Labs Reviewed  ?BASIC METABOLIC PANEL - Abnormal; Notable for the following components:  ?    Result Value  ? CO2 21 (*)   ? Glucose,  Bld 125 (*)   ? BUN 29 (*)   ? Creatinine, Ser 1.78 (*)   ? Calcium 8.5 (*)   ? GFR, Estimated 38 (*)   ? All other components within normal limits  ?URINALYSIS, ROUTINE W REFLEX MICROSCOPIC - Abnormal; Notable for the followi

## 2022-03-01 NOTE — ED Provider Triage Note (Signed)
Emergency Medicine Provider Triage Evaluation Note ? ?Rito Ehrlich , a 81 y.o. male  was evaluated in triage.  Pt complains of LOC episode. ?Patient seen yesterday and started on Valcyclovir for shingles. Today, he was doing well, until there was an episode where his wife was talking to him and he would not respond. During this episode, he was looking into space. He did not pass out. Patient does not remember this brief episode. This lasted about 1-3 minutes. No history of seizures. He is back to his baseline now. ? ?Review of Systems  ?Positive:  ?Negative:  ? ?Physical Exam  ?BP 133/86 (BP Location: Right Arm)   Pulse 82   Temp 98.1 ?F (36.7 ?C) (Oral)   Resp 14   SpO2 96%  ?Gen:   Awake, no distress   ?Resp:  Normal effort  ?MSK:   Moves extremities without difficulty  ?Other:   ? ?Medical Decision Making  ?Medically screening exam initiated at 1:24 PM.  Appropriate orders placed.  TRAYVOND VIETS was informed that the remainder of the evaluation will be completed by another provider, this initial triage assessment does not replace that evaluation, and the importance of remaining in the ED until their evaluation is complete. ? ? ?  ?Claudie Leach, PA-C ?03/01/22 1325 ? ?

## 2022-03-01 NOTE — ED Triage Notes (Addendum)
Pt arrived POV from home c/o altered mental status. Pt was getting out of the shower this morning waiting for his wife to help him get dressed. Per family pt was unable to respond to wife became weak, and was looking off into space. Pt states now he feels fine and back to normal and that he could hear his wife talking to him but was unable to respond to her. Pt was diagnosed with shingles on his left shoulder and neck.  ?

## 2022-03-02 ENCOUNTER — Telehealth: Payer: Self-pay | Admitting: *Deleted

## 2022-03-02 DIAGNOSIS — R4701 Aphasia: Secondary | ICD-10-CM | POA: Diagnosis not present

## 2022-03-02 DIAGNOSIS — G459 Transient cerebral ischemic attack, unspecified: Secondary | ICD-10-CM | POA: Diagnosis not present

## 2022-03-02 NOTE — Telephone Encounter (Addendum)
Dtr called and reported pt was in the ER 2 days for 2 different things. On 3/28, she states he was diagnosed with shingles and started Valtrex then on 3/29, pt was examined for TIA versus seizure; she states that they ruled out TIA and will follow up with Neurology. Advised since INR was checked last night in the ER and it was 1.9 to have pt to take 1.5 tablets of Warfarin then resume 1 tablet daily;she verbalized understanding. Also, advised that Valtrex does not interact with warfarin and that we will check the INR on Monday and set appt with her.  ?

## 2022-03-02 NOTE — Consult Note (Signed)
NEUROLOGY CONSULTATION NOTE  ? ?Date of service: March 02, 2022 ?Patient Name: Roberto Dickson ?MRN:  [id number] ?DOB:  1941/01/13 ?Reason for consult: "confusion vs aphasia" ?Requesting Provider: Delora Fuel, MD ?_ _ _   _ __   _ __ _ _  __ __   _ __   __ _ ? ?History of Present Illness  ?Roberto Dickson is a 81 y.o. 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 ? ?History obtained from patien using interpretor with daughter at the bedside filling in and providing additional history. Reports getting out of shower, felt confused and was trying to talk to his wife but she could not understand him. He felt he was speaking fine. Does report feeling foggy in his head at that time. Per patient, was lightheaded and this lasted 10-15 mins and he was already feeling better when EMS arrived and declined to come to the ED. Was discussed with PCP who directed them to the ED. Denies any shaking, no stiffness. Reported history of starring off but patient denies it and reports that he was able to keep eye contact with wife. No prior similar episodes, no history of seizures. Most recent INR was just below goal at 1.9. Has been drinking less water over the last few days. Has been eating fine. No fever, no headache. He is back to his baseline. ? ?Daughter reports that when EMS got there, his blood pressure was in 0000000 systolic which is low for him considering he stopped his antihypertensives about a week ago due to concern for a rahs which turned out to be shingles. SBP in 150s here which is normal for him when off his meds. ? ?  ?ROS  ? ?Constitutional Denies weight loss, fever and chills.   ?HEENT Denies changes in vision and hearing.   ?Respiratory Denies SOB and cough.   ?CV Denies palpitations and CP   ?GI Denies abdominal pain, nausea, vomiting and  diarrhea.   ?GU Denies dysuria and urinary frequency.   ?MSK Denies myalgia and joint pain.   ?Skin Denies rash and pruritus.   ?Neurological Denies headache and syncope.   ?Psychiatric Denies recent changes in mood. Denies anxiety and depression.   ? ?Past History  ? ?Past Medical History:  ?Diagnosis Date  ? Hypertension   ? ?Past Surgical History:  ?Procedure Laterality Date  ? APPENDECTOMY    ? in his 60's  ? CORONARY ANGIOGRAPHY N/A 10/20/2021  ? Procedure: CORONARY ANGIOGRAPHY;  Surgeon: Burnell Blanks, MD;  Location: [facility];  Service: Cardiovascular;  Laterality: N/A;  ? IR CT HEAD LTD  07/19/2021  ? IR FLUORO GUIDED NEEDLE PLC ASPIRATION/INJECTION LOC  07/21/2021  ? IR PERCUTANEOUS ART THROMBECTOMY/INFUSION INTRACRANIAL INC DIAG ANGIO  07/19/2021  ? RADIOLOGY WITH ANESTHESIA N/A 07/19/2021  ? Procedure: IR WITH ANESTHESIA;  Surgeon: Luanne Bras, MD;  Location: [facility];  Service: Radiology;  Laterality: N/A;  ? ?Family History  ?Problem Relation Age of Onset  ? Cancer - Prostate Father   ? ?Social History  ? ?Socioeconomic History  ? Marital status: Married  ?  Spouse name: Not on file  ? Number of children: Not on file  ? Years of education: Not on file  ? Highest education level: Not on file  ?Occupational History  ? Not on file  ?  Tobacco Use  ? Smoking status: Never  ? Smokeless tobacco: Never  ?Vaping Use  ? Vaping Use: Never used  ?Substance and Sexual Activity  ? Alcohol use: Not Currently  ?  Alcohol/week: 1.0 standard drink  ?  Types: 1 Cans of beer per week  ? Drug use: Never  ? Sexual activity: Not on file  ?Other Topics Concern  ? Not on file  ?Social History Narrative  ? Not on file  ? ?Social Determinants of Health  ? ?Financial Resource Strain: Not on file  ?Food Insecurity: Not on file  ?Transportation Needs: Not on file  ?Physical Activity: Not on file  ?Stress: Not on file  ?Social Connections: Not on file  ? ?No Known Allergies ? ?Medications  ?(Not in a hospital  admission) ?  ? ?Vitals  ? ?Vitals:  ? 03/01/22 2200 03/01/22 2300 03/01/22 2330 03/01/22 2347  ?BP: (!) 148/88 (!) 152/81 (!) 153/115   ?Pulse: 96 (!) 102 (!) 106 95  ?Resp: (!) 25 (!) 26 (!) 28 20  ?Temp:      ?TempSrc:      ?SpO2: 93% 93% 95% 92%  ?Weight:      ?Height:      ?  ? ?Body mass index is 27.44 kg/m?. ? ?Physical Exam  ? ?General: Laying comfortably in bed; in no acute distress.  ?HENT: Normal oropharynx and mucosa. Normal external appearance of ears and nose.  ?Neck: Supple, no pain or tenderness  ?CV: No JVD. No peripheral edema.  ?Pulmonary: Symmetric Chest rise. Normal respiratory effort.  ?Abdomen: Soft to touch, non-tender.  ?Ext: No cyanosis, edema, or deformity  ?Skin: No rash. Normal palpation of skin.   ?Musculoskeletal: Normal digits and nails by inspection. No clubbing.  ? ?Neurologic Examination  ?Mental status/Cognition: Alert, oriented to self, place, month and year, good attention.  ?Speech/language: Fluent, comprehension intact, object naming intact, repetition intact.  ?Cranial nerves:  ? CN II Pupils equal and reactive to light, no VF deficits   ? CN III,IV,VI EOM intact, no gaze preference or deviation, no nystagmus   ? CN V normal sensation in V1, V2, and V3 segments bilaterally   ? CN VII no asymmetry, no nasolabial fold flattening   ? CN VIII normal hearing to speech   ? CN IX & X normal palatal elevation, no uvular deviation   ? CN XI 5/5 head turn and 5/5 shoulder shrug bilaterally   ? CN XII midline tongue protrusion   ? ?Motor:  ?Muscle bulk: normal, tone normal, pronator drift none tremor none ?Mvmt Root Nerve  Muscle Right Left Comments  ?SA C5/6 Ax Deltoid 5 5   ?EF C5/6 Mc Biceps 5 5   ?EE C6/7/8 Rad Triceps 5 5   ?WF C6/7 Med FCR     ?WE C7/8 PIN ECU     ?F Ab C8/T1 U ADM/FDI 5 5   ?HF L1/2/3 Fem Illopsoas 5 5   ?KE L2/3/4 Fem Quad 5 5   ?DF L4/5 D Peron Tib Ant 5 5   ?PF S1/2 Tibial Grc/Sol 5 5   ? ?Reflexes: ? Right Left Comments  ?Pectoralis     ? Biceps (C5/6) 2 2    ?Brachioradialis (C5/6) 2 2   ? Triceps (C6/7) 2 2   ? Patellar (L3/4) 2 2   ? Achilles (S1)     ? Hoffman     ? Plantar     ?Jaw jerk   ? ?Sensation: ? Light touch  Intact throughout  ? Pin prick   ? Temperature   ? Vibration   ?Proprioception   ? ?Coordination/Complex Motor:  ?- Finger to Nose intact BL ?- Heel to shin intact BL ?- Rapid alternating movement are normal ?- Gait: Deferred. ? ?Labs  ? ?CBC:  ?Recent Labs  ?Lab 03/01/22 ?1330  ?WBC 6.4  ?NEUTROABS 4.5  ?HGB 13.2  ?HCT 40.2  ?MCV 94.4  ?PLT 164  ? ? ?Basic Metabolic Panel:  ?Lab Results  ?Component Value Date  ? NA 135 03/01/2022  ? K 3.9 03/01/2022  ? CO2 21 (L) 03/01/2022  ? GLUCOSE 125 (H) 03/01/2022  ? BUN 29 (H) 03/01/2022  ? CREATININE 1.78 (H) 03/01/2022  ? CALCIUM 8.5 (L) 03/01/2022  ? GFRNONAA 38 (L) 03/01/2022  ? ?Lipid Panel:  ?Lab Results  ?Component Value Date  ? [place] 74 10/13/2021  ? ?HgbA1c:  ?Lab Results  ?Component Value Date  ? HGBA1C 5.9 (H) 07/20/2021  ? ?Urine Drug Screen:  ?   ?Component Value Date/Time  ? [place] DETECTED 07/23/2021 1259  ? COCAINSCRNUR NONE DETECTED 07/23/2021 1259  ? LABBENZ NONE DETECTED 07/23/2021 1259  ? AMPHETMU NONE DETECTED 07/23/2021 1259  ? THCU NONE DETECTED 07/23/2021 1259  ? LABBARB NONE DETECTED 07/23/2021 1259  ?  ?Alcohol Level No results found for: ETH ? ?CT Head without contrast(Personally reviewed): ?CTH was negative for a large hypodensity concerning for a large territory infarct or hyperdensity concerning for an ICH ? ?MRI Brain(Personally reviewed): ?1. No acute intracranial abnormality. ?2. Underlying mild chronic small vessel ischemic disease with ?multiple scattered remote cortical infarcts involving the bilateral ?cerebral hemispheres as above. ? ?rEEG: ?Pending outpatient. ? ?Impression  ? ?Roberto Dickson is a 81 y.o. 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 History is challenging specially given that he denies starring off, reports that confusion was only 10-15 mins. This contradicts with th

## 2022-03-04 LAB — URINE CULTURE: Culture: 80000 — AB

## 2022-03-05 NOTE — Telephone Encounter (Signed)
Post ED Visit - Positive Culture Follow-up ? ?Culture report reviewed by antimicrobial stewardship pharmacist: ?[facility] ?[]  , Enzo Bi.D. ?[]  [address], Pharm.D., BCPS AQ-ID ?[]  , Pharm.D., BCPS ?[]  Celedonio Miyamoto, Pharm.D., BCPS ?[]  [place], Garvin Fila.D., BCPS, AAHIVP ?[]  , Pharm.D., BCPS, AAHIVP ?[]  Georgina Pillion, PharmD, BCPS ?[]  , PharmD, BCPS ?[]  [place], PharmD, BCPS ?[]  [address], PharmD ?[]  , PharmD, BCPS ?[x] Estella Husk PharmD ? ? [facility] ?[]  Lysle Pearl, PharmD ?[]  , PharmD ?[]  Phillips Climes, PharmD ?[]  , Rph ?[]  Agapito Games) , PharmD ?[]  Verlan Friends, PharmD ?[]  , PharmD ?[]  Mervyn Gay, PharmD ?[]  , PharmD ?[]  Daylene Posey, PharmD ?[]  Gerri Spore, PharmD ?[]  , PharmD ?[]  Len Childs, PharmD ? ? ?Positive urine culture ?no further patient follow-up is required at this time per , PA ? ?Greer Pickerel ?03/05/2022, 4:53 PM ?  ?

## 2022-03-06 ENCOUNTER — Encounter: Payer: Self-pay | Admitting: Emergency Medicine

## 2022-03-06 ENCOUNTER — Ambulatory Visit (INDEPENDENT_AMBULATORY_CARE_PROVIDER_SITE_OTHER): Payer: Medicare Other | Admitting: *Deleted

## 2022-03-06 ENCOUNTER — Other Ambulatory Visit: Payer: Self-pay | Admitting: Emergency Medicine

## 2022-03-06 DIAGNOSIS — I63512 Cerebral infarction due to unspecified occlusion or stenosis of left middle cerebral artery: Secondary | ICD-10-CM | POA: Diagnosis not present

## 2022-03-06 DIAGNOSIS — I513 Intracardiac thrombosis, not elsewhere classified: Secondary | ICD-10-CM | POA: Diagnosis not present

## 2022-03-06 DIAGNOSIS — Z5181 Encounter for therapeutic drug level monitoring: Secondary | ICD-10-CM | POA: Diagnosis not present

## 2022-03-06 DIAGNOSIS — N3000 Acute cystitis without hematuria: Secondary | ICD-10-CM

## 2022-03-06 LAB — POCT INR: INR: 2.6 (ref 2.0–3.0)

## 2022-03-06 MED ORDER — CEFUROXIME AXETIL 500 MG PO TABS
500.0000 mg | ORAL_TABLET | Freq: Two times a day (BID) | ORAL | 0 refills | Status: AC
Start: 2022-03-06 — End: 2022-03-13

## 2022-03-06 NOTE — Telephone Encounter (Signed)
Daughter, Byrd Hesselbach called in and states shingles is getting better, and she was surprised to see what did show up in the urine.  ? ?Does pt need an antibiotics? Does have blisters that are popping and bleading.   ? ?[id number] - [place], [place] - [address] AT [facility] [address] Phone:  [phone]  ?Fax:  [phone]  ?  ? ?

## 2022-03-06 NOTE — Patient Instructions (Signed)
Description   ?Continue taking Warfarin 1 tablet daily. Recheck INR in 3 weeks. [facility] [phone].  ?  ?  ?

## 2022-03-06 NOTE — Telephone Encounter (Signed)
Yes he does.  Urine culture shows growth of E. coli. ?Antibiotic sent to pharmacy of record, Ceftin 500 mg twice a day for 7 days.

## 2022-03-07 ENCOUNTER — Ambulatory Visit: Payer: Medicare Other | Admitting: Adult Health

## 2022-03-12 ENCOUNTER — Other Ambulatory Visit: Payer: Self-pay | Admitting: Physical Medicine & Rehabilitation

## 2022-03-16 ENCOUNTER — Other Ambulatory Visit: Payer: Self-pay | Admitting: Emergency Medicine

## 2022-03-16 ENCOUNTER — Other Ambulatory Visit: Payer: Self-pay | Admitting: Physical Medicine & Rehabilitation

## 2022-03-22 ENCOUNTER — Ambulatory Visit: Payer: Medicare Other | Admitting: Neurology

## 2022-03-26 ENCOUNTER — Other Ambulatory Visit: Payer: Self-pay | Admitting: Physical Medicine & Rehabilitation

## 2022-03-31 ENCOUNTER — Ambulatory Visit (INDEPENDENT_AMBULATORY_CARE_PROVIDER_SITE_OTHER): Payer: Medicare Other | Admitting: *Deleted

## 2022-03-31 DIAGNOSIS — I63512 Cerebral infarction due to unspecified occlusion or stenosis of left middle cerebral artery: Secondary | ICD-10-CM | POA: Diagnosis not present

## 2022-03-31 DIAGNOSIS — I513 Intracardiac thrombosis, not elsewhere classified: Secondary | ICD-10-CM | POA: Diagnosis not present

## 2022-03-31 DIAGNOSIS — Z5181 Encounter for therapeutic drug level monitoring: Secondary | ICD-10-CM | POA: Diagnosis not present

## 2022-03-31 LAB — POCT INR: INR: 2.5 (ref 2.0–3.0)

## 2022-03-31 MED ORDER — WARFARIN SODIUM 6 MG PO TABS: ORAL_TABLET | ORAL | 1 refills | Status: DC

## 2022-03-31 NOTE — Patient Instructions (Signed)
Description   Continue taking Warfarin 1 tablet daily. Recheck INR in 4 weeks. [facility] [phone].       

## 2022-04-04 ENCOUNTER — Ambulatory Visit: Payer: Medicare Other | Admitting: Neurology

## 2022-04-06 ENCOUNTER — Encounter: Payer: Self-pay | Admitting: Emergency Medicine

## 2022-04-28 ENCOUNTER — Encounter (HOSPITAL_BASED_OUTPATIENT_CLINIC_OR_DEPARTMENT_OTHER): Payer: Self-pay | Admitting: Family

## 2022-04-28 ENCOUNTER — Ambulatory Visit (INDEPENDENT_AMBULATORY_CARE_PROVIDER_SITE_OTHER): Payer: Medicare Other | Admitting: Family

## 2022-04-28 ENCOUNTER — Ambulatory Visit (INDEPENDENT_AMBULATORY_CARE_PROVIDER_SITE_OTHER): Payer: Medicare Other | Admitting: *Deleted

## 2022-04-28 VITALS — BP 124/76 | HR 57 | Ht 66.0 in | Wt 170.1 lb

## 2022-04-28 DIAGNOSIS — I25118 Atherosclerotic heart disease of native coronary artery with other forms of angina pectoris: Secondary | ICD-10-CM

## 2022-04-28 DIAGNOSIS — B0229 Other postherpetic nervous system involvement: Secondary | ICD-10-CM

## 2022-04-28 DIAGNOSIS — Z8673 Personal history of transient ischemic attack (TIA), and cerebral infarction without residual deficits: Secondary | ICD-10-CM | POA: Diagnosis not present

## 2022-04-28 DIAGNOSIS — I63512 Cerebral infarction due to unspecified occlusion or stenosis of left middle cerebral artery: Secondary | ICD-10-CM

## 2022-04-28 DIAGNOSIS — I5022 Chronic systolic (congestive) heart failure: Secondary | ICD-10-CM

## 2022-04-28 DIAGNOSIS — D6859 Other primary thrombophilia: Secondary | ICD-10-CM

## 2022-04-28 DIAGNOSIS — Z5181 Encounter for therapeutic drug level monitoring: Secondary | ICD-10-CM

## 2022-04-28 DIAGNOSIS — I513 Intracardiac thrombosis, not elsewhere classified: Secondary | ICD-10-CM

## 2022-04-28 LAB — POCT INR: INR: 3.6 — AB (ref 2.0–3.0)

## 2022-04-28 NOTE — Patient Instructions (Signed)
Description   Do not take any Warfarin today then continue taking Warfarin 1 tablet daily. Recheck INR in 3 weeks. [facility] [phone].

## 2022-04-28 NOTE — Patient Instructions (Addendum)
Medication Instructions:  Continue your current medications.   *If you need a refill on your cardiac medications before your next appointment, please call your pharmacy*  Lab Work: None ordered today.   Testing/Procedures: None ordered today.   Follow-Up: At [facility], you and your health needs are our priority.  As part of our continuing mission to provide you with exceptional heart care, we have created designated Provider Care Teams.  These Care Teams include your primary Cardiologist (physician) and Advanced Practice Providers (APPs -  Physician Assistants and Nurse Practitioners) who all work together to provide you with the care you need, when you need it.  We recommend signing up for the patient portal called "MyChart".  Sign up information is provided on this After Visit Summary.  MyChart is used to connect with patients for Virtual Visits (Telemedicine).  Patients are able to view lab/test results, encounter notes, upcoming appointments, etc.  Non-urgent messages can be sent to your provider as well.   To learn more about what you can do with MyChart, go to [email or web address].com.    Your next appointment:   6 month(s)  The format for your next appointment:   In Person  Provider:   Bridgette Christopher, MD or Brande Uncapher S Toini Failla, NP     Other Instructions  Heart Healthy Diet Recommendations: A low-salt diet is recommended. Meats should be grilled, baked, or boiled. Avoid fried foods. Focus on lean protein sources like fish or chicken with vegetables and fruits. The American Heart Association is a GREAT resource!  American Heart Association Diet and Lifeystyle Recommendations  Exercise recommendations: The American Heart Association recommends 150 minutes of moderate intensity exercise weekly. Try 30 minutes of moderate intensity exercise 4-5 times per week. This could include walking, jogging, or swimming.   Important Information About Sugar       

## 2022-04-28 NOTE — Progress Notes (Unsigned)
Office Visit    Patient Name: Roberto Dickson Date of Encounter: 04/28/2022  PCP:  Horald Pollen, [place]  Cardiologist:  Buford Dresser, MD  Advanced Practice Provider:  No care team member to display Electrophysiologist:  None      Chief Complaint    Roberto Dickson is a 81 y.o. male with a hx of CVA, HTN, HLD, LV thrombus, CAD presents today for follow-up after echocardiogram.   Past Medical History    Past Medical History:  Diagnosis Date   Hypertension    Past Surgical History:  Procedure Laterality Date   APPENDECTOMY     in his 49's   CORONARY ANGIOGRAPHY N/A 10/20/2021   Procedure: CORONARY ANGIOGRAPHY;  Surgeon: Burnell Blanks, MD;  Location: [facility];  Service: Cardiovascular;  Laterality: N/A;   IR CT HEAD LTD  07/19/2021   IR FLUORO GUIDED NEEDLE PLC ASPIRATION/INJECTION LOC  07/21/2021   IR PERCUTANEOUS ART THROMBECTOMY/INFUSION INTRACRANIAL INC DIAG ANGIO  07/19/2021   RADIOLOGY WITH ANESTHESIA N/A 07/19/2021   Procedure: IR WITH ANESTHESIA;  Surgeon: Luanne Bras, MD;  Location: [facility];  Service: Radiology;  Laterality: N/A;    Allergies  No Known Allergies  History of Present Illness    Roberto Dickson is a 81 y.o. male with a hx of CVA, HTN, HLD, LV thrombus, CAD last seen 01/2022  He was admitted 07/26/2021 - 08/10/2021 with acute ischemic left middle cerebral artery (MCA) stroke.  He received tPA and additionally underwent cerebral angiography with revascularization of left ICA from proximal to distal terminus and origin of left MCA.  Echocardiogram with LVEF 50 to 55% with apical thrombus and Coumadin started.  There were wall motion abnormalities noted by echocardiogram but this was recommended for outpatient duration.[name]  He was recommended for CIR.  Seen in follow up 08/31/21 with his daughter. Stress test was recommended due to wall motion abnormalities by echo. Stress test  09/06/21 with large size, severe severity mid to apical inferior mostly fixed perfusion defect suggestive of large RCA territory scar with peri-infarct ischemia. LVEF read as 29% but suspected to be falsely low.  Cardiac CT 10/2022With calcium score 6053 and significant three vessel disease with likely CTO of LAD. Subsequent cardiac cath 10/20/21 with severe triple vessel CAD (CTO mid LAD, distal LAD diffusely diseased filing with L-L collaterals, moderate caliber diagonal branch severe disease, severe disease Cx and first obtuse marginal, CTO dominant RCA with L-R collaterals). PCI not an option. Plan for outpatient CT surgery consult given may not be good candidate for CABG given recent CVA and LV thrombus. He had preserved LVEF.  Seen in follow up 11/2021. Preferred to proceed with medical therapy of coronary disease as he was asymptomatic. He met with [name] of [facility] and decided to proceed with medical management. Repeat echocardiogram 01/13/22 with LVEF 45-50%, RWMA, mild LVH, gr1DD, dilation aortic root 32m and ascending aorta 442m There was swirling artifact at apex suggesting slow flow but previously LV thrombus resolved. Given akinetic apex, recommended to continue anticoagulation. At follow up 01/2022 he was doing well.   He presents today for follow up with daughter and interpretor. Feeling well. Reports no shortness of breath nor dyspnea on exertion. Reports no chest pain, pressure, or tightness. No edema, orthopnea, PND. Reports no palpitations. Since last seen had shingles to his posterior neck. He still has some nerve related pain not relieved by menthol ointment, cold compress,  or PO Benadry. Has not yet tried Benadryl ointment.   EKGs/Labs/Other Studies Reviewed:   The following studies were reviewed today:  Echo 01/2022  1. Left ventricular ejection fraction, by estimation, is 45 to 50%. The  left ventricle has mildly decreased function. The left ventricle  demonstrates regional  wall motion abnormalities (see scoring  diagram/findings for description). There is mild left  ventricular hypertrophy. Left ventricular diastolic parameters are  consistent with Grade I diastolic dysfunction (impaired relaxation).   2. Right ventricular systolic function is mildly reduced. The right  ventricular size is normal.   3. The mitral valve is normal in structure. Trivial mitral valve  regurgitation. No evidence of mitral stenosis.   4. The aortic valve is tricuspid. Aortic valve regurgitation is mild to  moderate. Aortic valve sclerosis/calcification is present, without any  evidence of aortic stenosis.   5. Aortic dilatation noted. There is dilatation of the aortic root,  measuring 41 mm. There is dilatation of the ascending aorta, measuring 43  mm.   Comparison(s): Compared to prior, there is swirling artifact at apex on  contrast images suggesting slow flow but previously seen LV thrombus is  not present. Apex remains akinetic however, would continue anticoagulation  if tolerating.   Coronary angiography 10/20/21   Mid RCA lesion is 100% stenosed.   Mid Cx to Dist Cx lesion is 90% stenosed.   1st Mrg lesion is 90% stenosed.   Prox Cx to Mid Cx lesion is 80% stenosed.   Mid LAD to Dist LAD lesion is 100% stenosed.   2nd Diag lesion is 90% stenosed.   Ost LAD to Mid LAD lesion is 30% stenosed.   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 The distal LAD is diffusely diseased and fills from left to left collaterals. The moderate caliber diagonal branch has severe disease. Severe disease in the mid and distal AV groove Circumflex. Severe disease in the first obtuse marginal branch Chronic total occlusion of the dominant RCA in the mid segment. The distal vessel fills from left to right collaterals.    Recommendations: He has an LV thrombus by recent echo and has been on coumadin. He has been bridged with Lovenox for this procedure today. I did not  cross into the LV today due to the LV thrombus. He will be admitted post cath and we will start IV heparin 8 hours post sheath pull. I would anticipate that he will be watched overnight and then Lovenox bridging resumed tomorrow if no access site bleeding. Resume coumadin tomorrow. In regards to his CAD, he has severe three vessel CAD with CTO of the RCA and LAD. PCI is not an option. His options include medical management vs CABG. Given advanced age, recent stroke and LV thrombus, he may not be a good candidate for CABG. This would be delayed for several months anyway given LV thrombus and recent stroke. We can plan an outpatient CT surgery consult in several weeks post discharge. Fortunately he has overall preserved LV systolic function and is asymptomatic.    Cardiac CT 09/29/21 IMPRESSION: 1. Coronary calcium score of 6053. This was 99th percentile for age-, sex, and race-matched controls.   2. Normal coronary origin with right dominance.   3. 100% occlusion of the mid LAD that likely a chronic total occlusion with collateral flow filling the vessel distally.   4. Moderate (50-69%) mixed density plaque in the mid LCX.   5. Severe stenosis in the proximal RCA (70-99%)  with 100% occlusion versus subtotal occlusion of the mid RCA.   6. The apical myocardium is thinned and aneurysmal consistent with prior LAD infarction.   7. There is a filling defect in the apex (measures 26 mm x 26 mm) consistent with LV thrombus.   RECOMMENDATIONS: 1. Cardiac catheterization is recommended due to concerns for 3-vessel CAD.   2. LV thrombus is known from prior imaging.   Myoview 09/07/21   Findings are consistent with prior myocardial infarction with peri-infarct ischemia. The study is high risk.   No ST deviation was noted.   LV perfusion is abnormal. There is no evidence of ischemia. There is evidence of infarction. Defect 1: There is a large defect with severe reduction in uptake present in the  apical to mid inferior location(s) that is partially reversible. Viability is present. There is abnormal wall motion in the defect area. Consistent with infarction and peri-infarct ischemia.   Left ventricular function is abnormal. Global function is severely reduced. There was a single regional abnormality. Nuclear stress EF: 29 %. The left ventricular ejection fraction is severely decreased (<30%). End diastolic cavity size is mildly enlarged. End systolic cavity size is mildly enlarged.   Prior study not available for comparison.   Large size, severe severity mid to apical inferior mostly fixed perfusion defect, suggestive of large RCA territory scar with peri-infarct ischemia (SDS 5). LVEF 29% with inferior and apical akinesis (suspect LVEF is falsely low, recommend echo correlation). This is a high risk study. No prior for comparison.  Echo 07/20/21  1. 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 Apical, apical anterior,  and apical septal akinesis. Inferoseptal hypokinesis. Left ventricular  ejection fraction, by estimation, is 50 to 55%. The left ventricle has low  normal function. The left  ventricle demonstrates regional wall motion abnormalities (see scoring  diagram/findings for description). Left ventricular diastolic parameters  are consistent with Grade I diastolic dysfunction (impaired relaxation).   2. Right ventricular systolic function is normal. The right ventricular  size is normal.   3. The mitral valve is normal in structure. Trivial mitral valve  regurgitation. No evidence of mitral stenosis.   4. The aortic valve is normal in structure. Aortic valve regurgitation is  not visualized. No aortic stenosis is present.   5. The inferior vena cava is normal in size with greater than 50%  respiratory variability, suggesting right atrial pressure of 3 mmHg.   EKG:No EKG today.   Recent Labs: 07/21/2021: B Natriuretic Peptide 82.3 10/13/2021: ALT 19 03/01/2022: BUN 29;  Creatinine, Ser 1.78; Hemoglobin 13.2; Platelets 164; Potassium 3.9; Sodium 135  Recent Lipid Panel    Component Value Date/Time   CHOL 128 10/13/2021 0000   TRIG 134 10/13/2021 0000   HDL 30 (L) 10/13/2021 0000   CHOLHDL 4.3 10/13/2021 0000   CHOLHDL 6.0 07/20/2021 0536   VLDL 17 07/20/2021 0536   LDLCALC 74 10/13/2021 0000   Home Medications   Current Meds  Medication Sig   acetaminophen (TYLENOL) 325 MG tablet Take 1-2 tablets (325-650 mg total) by mouth every 4 (four) hours as needed for mild pain.   amLODipine (NORVASC) 5 MG tablet Take 1 tablet (5 mg total) by mouth daily.   aspirin EC 81 MG EC tablet Take 1 tablet (81 mg total) by mouth daily. Swallow whole.   atorvastatin (LIPITOR) 80 MG tablet Take 1 tablet (80 mg total) by mouth daily.   docusate sodium (COLACE) 100 MG capsule Take 1 capsule (100 mg  total) by mouth 2 (two) times daily. (Patient taking differently: Take 100 mg by mouth daily.)   melatonin 3 MG TABS tablet Take 0.5 tablets (1.5 mg total) by mouth at bedtime. OVER THE COUNTER   metoprolol succinate (TOPROL-XL) 50 MG 24 hr tablet Take 1 tablet (50 mg total) by mouth at bedtime.   pantoprazole (PROTONIX) 40 MG tablet TAKE 1 TABLET(40 MG) BY MOUTH AT BEDTIME   polyethylene glycol (MIRALAX / GLYCOLAX) 17 g packet Take 17 g by mouth daily.   tamsulosin (FLOMAX) 0.4 MG CAPS capsule TAKE 1 CAPSULE(0.4 MG) BY MOUTH DAILY   tiZANidine (ZANAFLEX) 2 MG tablet TAKE 1 TABLET(2 MG) BY MOUTH AT BEDTIME   warfarin (COUMADIN) 6 MG tablet Take 1 tablet by mouth daily or as directed by Anticoagulation Clinic.     Review of Systems    All other systems reviewed and are otherwise negative except as noted above.  Physical Exam    VS:  BP 124/76   Pulse (!) 57   Ht '5\' 6"'  (1.676 m)   Wt 170 lb 1.6 oz (77.2 kg)   BMI 27.45 kg/m  , BMI Body mass index is 27.45 kg/m.  Wt Readings from Last 3 Encounters:  04/28/22 170 lb 1.6 oz (77.2 kg)  03/01/22 170 lb (77.1 kg)  02/09/22  170 lb (77.1 kg)    GEN: Well nourished, well developed, in no acute distress. HEENT: normal. Neck: Supple, no JVD, carotid bruits, or masses. Cardiac: RRR, no murmurs, rubs, or gallops. No clubbing, cyanosis, edema.  Radials/PT 2+ and equal bilaterally.  Respiratory:  Respirations regular and unlabored, clear to auscultation bilaterally. GI: Soft, nontender, nondistended. MS: No deformity or atrophy. Skin: Warm and dry, no rash. R radial catheterization site with no ecchymosis nor signs of infection.  Neuro:  Strength and sensation are intact. Psych: Normal affect.  Assessment & Plan   CAD - Echo 10/2021 with significant 3 vessel CAD not PCI candidate. LVEF preserved. Seen by [facility] and prefers medial management.He is asymptomatic with no chest pain nor dyspnea. His GDMT includes aspirin, metoprolol, atorvastatin. If anginal symptoms develop, could consider Imdur or alternative antianginal agent. Heart healthy diet and regular cardiovascular exercise encouraged.    HFrEF - Echo 01/2022 mildly reduced LVEF 45-50%. Recommend fluid restriction <2L, low salt diet. Continue beta blocker. He is understandably hesitant regarding additional medication therapy as he feels well though could consider ARB in future. No indication for diuretic at this time. NYHA I.  History of CVA-Continue to follow with neurology.  Continue atorvastatin, aspirin. Mild R hand and RLE defecits.   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 Repeat echo 01/2022 with resolution of thrombus but still akinetic apex with slow flow recommended for continuation of Warfarin. Educated to report hematuria, melena. Follows routinely with coumadin clinic.   HTN- BP at goal. Did not tolerate Losartan but optimal BP control with Amlodipine.   HLD, LDL goal less than 70-Continue Atorvastatin 51m daily.   Disposition: Follow up in in 6 months with Dr. CHarrell Gaveor  APP.  Signed, CLoel Dubonnet NP 04/28/2022, 3:45 PM [facility]

## 2022-04-29 ENCOUNTER — Encounter (HOSPITAL_BASED_OUTPATIENT_CLINIC_OR_DEPARTMENT_OTHER): Payer: Self-pay | Admitting: Family

## 2022-04-29 ENCOUNTER — Other Ambulatory Visit: Payer: Self-pay | Admitting: Cardiology

## 2022-05-02 NOTE — Telephone Encounter (Signed)
Rx(s) sent to pharmacy electronically.  

## 2022-05-19 ENCOUNTER — Ambulatory Visit (INDEPENDENT_AMBULATORY_CARE_PROVIDER_SITE_OTHER): Payer: Medicare Other | Admitting: *Deleted

## 2022-05-19 DIAGNOSIS — I513 Intracardiac thrombosis, not elsewhere classified: Secondary | ICD-10-CM

## 2022-05-19 DIAGNOSIS — I63512 Cerebral infarction due to unspecified occlusion or stenosis of left middle cerebral artery: Secondary | ICD-10-CM | POA: Diagnosis not present

## 2022-05-19 DIAGNOSIS — Z5181 Encounter for therapeutic drug level monitoring: Secondary | ICD-10-CM | POA: Diagnosis not present

## 2022-05-19 LAB — POCT INR: INR: 3.1 — AB (ref 2.0–3.0)

## 2022-05-19 NOTE — Patient Instructions (Signed)
Description   Tonight take 1/2 tablet then start taking Warfarin 1 tablet daily except 1/2 tablet on Fridays. Recheck INR in 3 weeks. Coumadin Clinic [phone].

## 2022-05-23 ENCOUNTER — Encounter: Payer: Medicare Other | Attending: Physical Medicine & Rehabilitation | Admitting: Physical Medicine & Rehabilitation

## 2022-05-23 ENCOUNTER — Encounter: Payer: Self-pay | Admitting: Physical Medicine & Rehabilitation

## 2022-05-23 VITALS — BP 131/80 | HR 83 | Ht 66.0 in | Wt 170.8 lb

## 2022-05-23 DIAGNOSIS — I6930 Unspecified sequelae of cerebral infarction: Secondary | ICD-10-CM

## 2022-05-23 DIAGNOSIS — I63512 Cerebral infarction due to unspecified occlusion or stenosis of left middle cerebral artery: Secondary | ICD-10-CM

## 2022-05-23 NOTE — Patient Instructions (Signed)
Capsaicin cream

## 2022-05-23 NOTE — Progress Notes (Signed)
Subjective:    Patient ID: Roberto Dickson, male    DOB: December 17, 1940, 81 y.o.   MRN: [id number] 81 y.o. male with history of hypertension who was admitted on 07/19/2021 with RUE weakness, left gaze deviation and inability to speak.  He received tPA and CTA head/neck showed occlusion of left ICA at origin with occlusion to carotid terminus.  He underwent cerebral angio with revascularization of left ICA from proximal to distal terminus and origin of left MCA by Dr. Corliss Skains.  MRI/MRA brain showed scattered acute infarcts in left cerebellum and both cerebral hemispheres largest in left posterior frontal region compatible with cardiac or aortic source.  2D echo done revealing EF of 50 to 55% with apical thrombus.    Postprocedure she was noted to have ecchymosis right groin due to partially thrombosed pseudoaneurysm which was treated with thrombin.  Follow-up ultrasound showed pseudoaneurysm to have resolved with 2.3x3.0 right groin hematoma.  He was also noted to have leukocytosis with low-grade fevers as well as tachypnea and was started on IV rest Rocephin due to concerns of UTI.  Fevers resolved however he continued to have issues with posterior lean, right knee instability, right upper extremity weakness affecting ADLs and mobility.  CIR was recommended due to functional decline. Admit date: 07/26/2021 Discharge date: 08/10/2021 HPI Spanish interpreter in the room.  Daughter is also here with him. The patient is dressing and bathing himself.  He has had no falls.  He ambulates without assistive device.  He walks 25 minutes twice a day through the hallways of his building.  Sometimes his wife goes with him sometimes he goes alone. The patient denies any pain complaints other than the back of his scalp where he had shingles but it is now healed.  He continues to have some burning pain at night.  He is seeing his primary care physician.  His daughter is asking for any suggestions regarding this.  We  discussed Zostrix or capsaicin cream as something to try over-the-counter.   Pain Inventory Average Pain 3 Pain Right Now 1 My pain is intermittent and burning  LOCATION OF PAIN  neck  BOWEL Number of stools per week: 7 Oral laxative use Yes  Type of laxative oral gel tab Enema or suppository use No  History of colostomy No  Incontinent No   BLADDER Normal In and out cath, frequency . Able to self cath  . Bladder incontinence No  Frequent urination No  Leakage with coughing No  Difficulty starting stream No  Incomplete bladder emptying No    Mobility walk without assistance ability to climb steps?  yes do you drive?  no needs help with transfers  Function retired I need assistance with the following:  household duties  Neuro/Psych tremor trouble walking depression  Prior Studies Any changes since last visit?  no  Physicians involved in your care Any changes since last visit?  no   Family History  Problem Relation Age of Onset   Cancer - Prostate Father    Social History   Socioeconomic History   Marital status: Married    Spouse name: Not on file   Number of children: Not on file   Years of education: Not on file   Highest education level: Not on file  Occupational History   Not on file  Tobacco Use   Smoking status: Never   Smokeless tobacco: Never  Vaping Use   Vaping Use: Never used  Substance and Sexual Activity   Alcohol  use: Not Currently    Alcohol/week: 1.0 standard drink of alcohol    Types: 1 Cans of beer per week   Drug use: Never   Sexual activity: Not on file  Other Topics Concern   Not on file  Social History Narrative   Not on file   Social Determinants of Health   Financial Resource Strain: Not on file  Food Insecurity: Not on file  Transportation Needs: Not on file  Physical Activity: Not on file  Stress: Not on file  Social Connections: Not on file   Past Surgical History:  Procedure Laterality Date    APPENDECTOMY     in his 23's   CORONARY ANGIOGRAPHY N/A 10/20/2021   Procedure: CORONARY ANGIOGRAPHY;  Surgeon: Kathleene Hazel, MD;  Location: [facility];  Service: Cardiovascular;  Laterality: N/A;   IR CT HEAD LTD  07/19/2021   IR FLUORO GUIDED NEEDLE PLC ASPIRATION/INJECTION LOC  07/21/2021   IR PERCUTANEOUS ART THROMBECTOMY/INFUSION INTRACRANIAL INC DIAG ANGIO  07/19/2021   RADIOLOGY WITH ANESTHESIA N/A 07/19/2021   Procedure: IR WITH ANESTHESIA;  Surgeon: Julieanne Cotton, MD;  Location: [facility];  Service: Radiology;  Laterality: N/A;   Past Medical History:  Diagnosis Date   Hypertension    BP 131/80   Pulse 83   Ht 5\' 6"  (1.676 m)   Wt 170 lb 12.8 oz (77.5 kg)   SpO2 95%   BMI 27.57 kg/m   Opioid Risk Score:   Fall Risk Score:  `1  Depression screen [facility] 2/9     11/22/2021    2:59 PM 11/09/2021    2:48 PM 08/25/2021    2:24 PM  Depression screen PHQ 2/9  Decreased Interest 0 0 0  Down, Depressed, Hopeless 0 0 0  PHQ - 2 Score 0 0 0  Altered sleeping   0  Tired, decreased energy   0  Change in appetite   0  Feeling bad or failure about yourself    1  Trouble concentrating   1  Moving slowly or fidgety/restless   0  Suicidal thoughts   0  PHQ-9 Score   2     Review of Systems  Musculoskeletal:  Positive for neck pain.  All other systems reviewed and are negative.     Objective:   Physical Exam Vitals and nursing note reviewed.  Constitutional:      Appearance: He is normal weight.  HENT:     Head: Normocephalic and atraumatic.  Eyes:     Extraocular Movements: Extraocular movements intact.     Conjunctiva/sclera: Conjunctivae normal.     Pupils: Pupils are equal, round, and reactive to light.  Musculoskeletal:     Right lower leg: No edema.     Left lower leg: No edema.  Skin:    General: Skin is warm and dry.  Neurological:     Mental Status: He is alert and oriented to person, place, and time.     Cranial Nerves: No dysarthria.      Sensory: No sensory deficit.     Coordination: Coordination abnormal. Impaired rapid alternating movements.     Gait: Gait abnormal.     Comments: Motor strength is 3 - at the finger extensors and finger flexors.  4 - at the wrist flexor and extensor 4 at the elbow flexors and extensors as well as at the deltoid. Left lower extremity is 5/5 left upper extremity 5/5 Right lower extremity is 5/5 Ambulates without assist device  patient has a wide base support and slow cadence with reduced step length Decreased finger to thumb opposition on the right hand  Psychiatric:        Mood and Affect: Mood normal.        Behavior: Behavior normal.           Assessment & Plan:   1.  Left MCA distribution infarct status post revascularization with neuroradiology.  The patient has had a good functional recovery.  We discussed continuing range of motion exercises of the right upper extremity as part of his issue with incomplete hand closure is related to range of motion at the MCPs and PIPs of the right hand.  Particularly affecting second and third digits. In addition encourage patient to continue his walking twice a day.  Follow-up with neurology  2.  Postherpetic neuralgia trial of Zostrix cream, he will follow-up with his PCP possibly trial some gabapentin. Physical medicine rehab follow-up as needed

## 2022-05-28 ENCOUNTER — Other Ambulatory Visit: Payer: Self-pay | Admitting: Physical Medicine & Rehabilitation

## 2022-06-08 ENCOUNTER — Ambulatory Visit (INDEPENDENT_AMBULATORY_CARE_PROVIDER_SITE_OTHER): Payer: Medicare Other | Admitting: *Deleted

## 2022-06-08 ENCOUNTER — Telehealth: Payer: Self-pay | Admitting: Adult Health

## 2022-06-08 DIAGNOSIS — I63512 Cerebral infarction due to unspecified occlusion or stenosis of left middle cerebral artery: Secondary | ICD-10-CM | POA: Diagnosis not present

## 2022-06-08 DIAGNOSIS — I513 Intracardiac thrombosis, not elsewhere classified: Secondary | ICD-10-CM

## 2022-06-08 DIAGNOSIS — Z5181 Encounter for therapeutic drug level monitoring: Secondary | ICD-10-CM

## 2022-06-08 LAB — POCT INR: INR: 2.5 (ref 2.0–3.0)

## 2022-06-08 NOTE — Patient Instructions (Addendum)
Description   Continue taking Warfarin 1 tablet daily except 1/2 tablet on Fridays. Recheck INR in 4 weeks. Coumadin Clinic [phone] or [phone]

## 2022-06-08 NOTE — Telephone Encounter (Signed)
error 

## 2022-06-08 NOTE — Telephone Encounter (Signed)
Rescheduled appointment with pt's daughter over the phone - NP out

## 2022-06-13 ENCOUNTER — Telehealth: Payer: Self-pay | Admitting: Adult Health

## 2022-06-13 NOTE — Telephone Encounter (Signed)
Rescheduled 7/19 appointment with pt over the phone - NP out 

## 2022-06-21 ENCOUNTER — Ambulatory Visit: Payer: Medicare Other | Admitting: Adult Health

## 2022-06-22 ENCOUNTER — Ambulatory Visit: Payer: Medicare Other | Admitting: Adult Health

## 2022-06-26 ENCOUNTER — Ambulatory Visit: Payer: Medicare Other | Admitting: Adult Health

## 2022-06-26 NOTE — Progress Notes (Unsigned)
[facility] [address] [place]. [place] [address] [phone]       HOSPITAL FOLLOW UP NOTE  Mr. LORANCE PICKERAL Date of Birth:  [birth year] Medical Record Number:  [id number]   Reason for Referral: stroke follow up    SUBJECTIVE:   CHIEF COMPLAINT:  No chief complaint on file.   HPI:   Update 06/26/2022 JM: Patient returns for stroke follow-up accompanied by *** and [place] interpreter after prior visit 9 months ago.  Patient evaluated 3/29 in ED after episode of acting abnormally, not speaking, and staring off which lasted approximately 1 hour and then completely resolved spontaneously. Per note, symptoms similar to prior stroke symptoms.  No definite seizure activity reported and no reoccurrence since initial episode. Patient reports memory of event unclear, he had pressured speech but unable to do so.  Reports compliance with warfarin although INR level subtherapeutic.  Of note, diagnosed with zoster rash to left torso and left neck the day prior and started on Valtrex.  Evaluated by neurologist Dr. Derry Lory, MR brain no acute stroke, unclear etiology of episode, possibly seizure and recommended outpatient EEG.   No additional episodes or new stroke/TIA symptoms since that time.  Reports residual right hand weakness ***.  Ambulates without assistive device, denies any recent falls.  Compliant on warfarin, denies side effects, closely followed by cardiology and Coumadin clinic with recent INR 2.5.  Blood pressure today ***.        History provided for reference purposes only Initial visit 09/29/2021 JM: Being seen for initial hospital stroke follow up accompanied by daughter and [place] interpreter. Overall doing well since discharge. Currently working with [facility] PT - reports some improvement of right hand weakness - denies residual leg weakness.  Initially using RW but now no AD -denies any recent falls.  Reports hand tremor but this has been  gradually improving - on tizanidine managed by PMR. Remains on warfarin without bleeding or bruising with prior INR 2.8 - has f/u scheduled 11/1 for repeat levels. Blood pressure today initially 160/84 (has not yet taken BP meds yet) and on recheck 138/88 - does not routinely monitor at home. Has since had f/u with cardiology - stress test 10/4 with reduced ER 29% though suspected falsely low - plans on completing cardiac CTA this afternoon and may need to proceed with cardiac cath if indicated.  No further concerns at this time.  Stroke admission 07/19/2021 Mr. BRITTNEY CARAWAY is a 81 y.o. male w/pmh of HTN who presented with 07/19/2021 with left gaze deviation, R facial droop and RUE weakness and aphasia.  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 Rt groin pseudoaneurysm post procedure s/p thrombin injection by VVS with repeat US showing resolution of pseudoaneurysm.  EF 50 to 55% with evidence of LV thrombus.  LDL 132.  A1c 5.9.  Initiated Coumadin and Lovenox bridge with INR goal 2-3 per cardiology recommendations.  Initiated atorvastatin 40 mg daily. Hx of HTN not on medical therapy. Therapy evals recommended CIR for posterior lean, right knee instability, and RUE weakness.        ROS:   14 system review of systems performed and negative with exception of those listed in HPI  PMH:  Past Medical History:  Diagnosis Date   Hypertension     PSH:  Past Surgical History:  Procedure Laterality Date   APPENDECTOMY     in his 75's   CORONARY ANGIOGRAPHY N/A 10/20/2021   Procedure: CORONARY ANGIOGRAPHY;  Surgeon: Kathleene Hazel, MD;  Location: [facility] INVASIVE CV LAB;  Service: Cardiovascular;  Laterality: N/A;   IR CT HEAD LTD  07/19/2021   IR FLUORO GUIDED NEEDLE PLC  ASPIRATION/INJECTION LOC  07/21/2021   IR PERCUTANEOUS ART THROMBECTOMY/INFUSION INTRACRANIAL INC DIAG ANGIO  07/19/2021   RADIOLOGY WITH ANESTHESIA N/A 07/19/2021   Procedure: IR WITH ANESTHESIA;  Surgeon: Julieanne Cotton, MD;  Location: [facility] OR;  Service: Radiology;  Laterality: N/A;    Social History:  Social History   Socioeconomic History   Marital status: Married    Spouse name: Not on file   Number of children: Not on file   Years of education: Not on file   Highest education level: Not on file  Occupational History   Not on file  Tobacco Use   Smoking status: Never   Smokeless tobacco: Never  Vaping Use   Vaping Use: Never used  Substance and Sexual Activity   Alcohol use: Not Currently    Alcohol/week: 1.0 standard drink of alcohol    Types: 1 Cans of beer per week   Drug use: Never   Sexual activity: Not on file  Other Topics Concern   Not on file  Social History Narrative   Not on file   Social Determinants of Health   Financial Resource Strain: Not on file  Food Insecurity: Not on file  Transportation Needs: Not on file  Physical Activity: Not on file  Stress: Not on file  Social Connections: Not on file  Intimate Partner Violence: Not on file    Family History:  Family History  Problem Relation Age of Onset   Cancer - Prostate Father     Medications:   Current Outpatient Medications on File Prior to Visit  Medication Sig Dispense Refill   acetaminophen (TYLENOL) 325 MG tablet Take 1-2 tablets (325-650 mg total) by mouth every 4 (four) hours as needed for mild pain.     amLODipine (NORVASC) 5 MG tablet Take 1 tablet (5 mg total) by mouth daily. 90 tablet 3   ASPIRIN LOW DOSE 81 MG tablet TAKE 1 TABLET(81 MG) BY MOUTH DAILY. SWALLOW WHOLE 90 tablet 1   atorvastatin (LIPITOR) 80 MG tablet Take 1 tablet (80 mg total) by mouth daily. 90 tablet 3   docusate sodium (COLACE) 100 MG capsule Take 1 capsule (100 mg total) by mouth 2 (two) times daily.  (Patient taking differently: Take 100 mg by mouth daily.) 60 capsule 0   melatonin 3 MG TABS tablet Take 0.5 tablets (1.5 mg total) by mouth at bedtime. OVER THE COUNTER 30 tablet 0   metoprolol succinate (TOPROL-XL) 50 MG 24 hr tablet Take 1 tablet (50 mg total) by mouth at bedtime. 90 tablet 3   pantoprazole (PROTONIX) 40 MG tablet TAKE 1 TABLET(40 MG) BY MOUTH AT BEDTIME 90 tablet 1   polyethylene glycol (MIRALAX / GLYCOLAX) 17 g packet Take 17 g by mouth daily. 30 each 0   tamsulosin (FLOMAX) 0.4 MG CAPS capsule TAKE 1 CAPSULE(0.4 MG) BY MOUTH DAILY 90 capsule 0   tiZANidine (ZANAFLEX) 2 MG tablet TAKE 1 TABLET(2 MG) BY MOUTH AT BEDTIME 30 tablet 1   warfarin (COUMADIN) 6 MG tablet Take 1 tablet by mouth daily or as directed by Anticoagulation Clinic. 100 tablet 1   No current facility-administered medications on  file prior to visit.    Allergies:  No Known Allergies    OBJECTIVE:  Physical Exam  There were no vitals filed for this visit.  There is no height or weight on file to calculate BMI. No results found.  General: well developed, well nourished, very pleasant elderly Hispanic male, seated, in no evident distress Head: head normocephalic and atraumatic.   Neck: supple with no carotid or supraclavicular bruits Cardiovascular: regular rate and rhythm, no murmurs Musculoskeletal: no deformity Skin:  no rash/petichiae Vascular:  Normal pulses all extremities   Neurologic Exam Mental Status: Awake and fully alert.  Primarily Spanish-speaking -denies dysarthria or aphasia.  Oriented to place and time. Recent and remote memory intact. Attention span, concentration and fund of knowledge appropriate. Mood and affect appropriate.  Cranial Nerves: Pupils equal, briskly reactive to light. Extraocular movements full without nystagmus. Visual fields full to confrontation. Hearing intact. Facial sensation intact. Face, tongue, palate moves normally and symmetrically.  Motor: Normal  bulk and tone. Normal strength in all tested extremity muscles except mild weakness right hand grip strength and dexterity Sensory.:  Slight decreased light touch and vibratory sensation RUE; decreased sensory BLE.  Coordination: Rapid alternating movements normal in all extremities except decreased right hand. Finger-to-nose and heel-to-shin performed accurately bilaterally.  Orbits left arm over right arm.  Mild action tremor RUE with outstretched arms Gait and Station: Arises from chair without difficulty. Stance is normal. Gait demonstrates normal stride length and mild imbalance without use of assistive device. Tandem walk and heel toe with mild difficulty.  Reflexes: 1+ and symmetric. Toes downgoing.          ASSESSMENT: SARITH WESTLY is a 81 y.o. 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 Vascular risk factors include HTN and HLD.  Evaluated in ED on 3/29 after episode of altered awareness and "staring off" that lasted approximately 1 hour and resolved spontaneously with concern of possible seizure     PLAN:  Left MCA strokes:  Residual deficit: mild right hand weakness with mild tremor. Continue working with [facility] PT for likely ongoing recovery.  Educated on tremor likely in setting of weakness.  Routinely followed by Dr. Posey Pronto PMR Continue warfarin daily  and atorvastatin for secondary stroke prevention and per cardiology recommendations Discussed secondary stroke prevention measures and importance of close PCP follow up for aggressive stroke risk factor management including BP goal<130/90, and HLD with LDL goal<70. I have gone over the pathophysiology of stroke, warning signs and symptoms, risk factors and their management in some detail with instructions to go to the closest emergency room for symptoms of concern. Transient altered  awareness: obtain EEG as he is at risk of seizure activity with hx of L MCA stroke. No additional episodes since that time. Will hold off on antiseizure medication at this time but low threshold to initiate if any additional episodes should occur. LV thrombus: repeat echo 01/2022 resolution of thrombus but still akinetic apex, cardiology recommend continuation of warfarin.  Continue warfarin with INR goal 2-3.       Follow up in 4 to 5 months or call earlier if needed   CC:  PCP: Horald Pollen, MD    I spent 58 minutes of face-to-face and non-face-to-face time with patient and daughter assisted by interpreter.  This included previsit chart review, lab review, study review, electronic health  record documentation, patient and daughter education regarding prior stroke including etiology and residual deficits, secondary stroke prevention measures and importance of managing stroke risk factors, and answered all other questions to patient and daughters satisfaction  Frann Rider, AGNP-BC  [facility] [address] [place], [place] [address]  Phone [phone] Fax [phone] Note: This document was prepared with digital dictation and possible smart phrase technology. Any transcriptional errors that result from this process are unintentional.

## 2022-07-07 ENCOUNTER — Ambulatory Visit (INDEPENDENT_AMBULATORY_CARE_PROVIDER_SITE_OTHER): Payer: Medicare Other

## 2022-07-07 DIAGNOSIS — Z5181 Encounter for therapeutic drug level monitoring: Secondary | ICD-10-CM

## 2022-07-07 DIAGNOSIS — I63512 Cerebral infarction due to unspecified occlusion or stenosis of left middle cerebral artery: Secondary | ICD-10-CM | POA: Diagnosis not present

## 2022-07-07 DIAGNOSIS — I513 Intracardiac thrombosis, not elsewhere classified: Secondary | ICD-10-CM

## 2022-07-07 LAB — POCT INR: INR: 2.5 (ref 2.0–3.0)

## 2022-07-07 NOTE — Patient Instructions (Signed)
Continue taking Warfarin 1 tablet daily except 1/2 tablet on Fridays. Recheck INR in 6 weeks. Coumadin Clinic [phone] or [phone]

## 2022-07-18 ENCOUNTER — Other Ambulatory Visit: Payer: Self-pay | Admitting: Emergency Medicine

## 2022-07-27 ENCOUNTER — Other Ambulatory Visit: Payer: Self-pay | Admitting: Physical Medicine & Rehabilitation

## 2022-07-27 ENCOUNTER — Other Ambulatory Visit: Payer: Self-pay | Admitting: Emergency Medicine

## 2022-08-03 ENCOUNTER — Encounter: Payer: Self-pay | Admitting: Adult Health

## 2022-08-03 ENCOUNTER — Ambulatory Visit (INDEPENDENT_AMBULATORY_CARE_PROVIDER_SITE_OTHER): Payer: Medicare Other | Admitting: Adult Health

## 2022-08-03 VITALS — BP 142/79 | HR 57 | Ht 67.0 in | Wt 170.0 lb

## 2022-08-03 DIAGNOSIS — R404 Transient alteration of awareness: Secondary | ICD-10-CM

## 2022-08-03 DIAGNOSIS — I63312 Cerebral infarction due to thrombosis of left middle cerebral artery: Secondary | ICD-10-CM | POA: Diagnosis not present

## 2022-08-03 DIAGNOSIS — I513 Intracardiac thrombosis, not elsewhere classified: Secondary | ICD-10-CM | POA: Diagnosis not present

## 2022-08-03 NOTE — Patient Instructions (Addendum)
Continue warfarin daily , aspirin and atorvastatin  for secondary stroke prevention and per cardiology recommendations   You will be scheduled to obtain an EEG to rule out seizures - please call if any additional episodes should occur  Continue to follow up with PCP regarding cholesterol and blood pressure management  Maintain strict control of hypertension with blood pressure goal below 130/90, diabetes with hemoglobin A1c goal below 7.0 % and cholesterol with LDL cholesterol (bad cholesterol) goal below 70 mg/dL.   Signs of a Stroke? Follow the BEFAST method:  Balance Watch for a sudden loss of balance, trouble with coordination or vertigo Eyes Is there a sudden loss of vision in one or both eyes? Or double vision?  Face: Ask the person to smile. Does one side of the face droop or is it numb?  Arms: Ask the person to raise both arms. Does one arm drift downward? Is there weakness or numbness of a leg? Speech: Ask the person to repeat a simple phrase. Does the speech sound slurred/strange? Is the person confused ? Time: If you observe any of these signs, call 911.        Thank you for coming to see [place] at [facility]. I hope we have been able to provide you high quality care today.  You may receive a patient satisfaction survey over the next few weeks. We would appreciate your feedback and comments so that we may continue to improve ourselves and the health of our patients.     Electroencefalograma en los adultos Electroencephalogram, Adult Un electroencefalograma (EEG) es un estudio que mide la actividad elctrica del cerebro. A menudo se lo [place] para diagnosticar o controlar problemas que guardan relacin con el cerebro, por ejemplo: Convulsiones. Episodios de desmayo. Problemas para dormir. Traumatismos en la cabeza. Cambios en la conducta. Informe al mdico acerca de lo siguiente: Cualquier alergia que tenga. Todos los [employer] [place], incluidos  vitaminas, hierbas, gotas oftlmicas, cremas y [address] de [address]. Cualquier enfermedad que tenga o haya tenido, incluidas las psiquitricas. Cirugas a las que se haya sometido. Antecedentes de abuso de drogas o consumo excesivo de alcohol. Cules son los riesgos? Por lo general, se trata de un estudio seguro. Si tiene un trastorno [employer], tal vez le provoquen una convulsin durante [place]. Esto se hace con el fin de Software engineer la actividad cerebral durante el episodio. Qu ocurre antes del procedimiento?  Llegue con el cabello limpio, seco y sin modelar. No se peine el cabello hacia el cuero cabelludo para agregarle volumen (batido). No se aplique spray, aceite ni otros productos para el cabello. No consuma cafena dentro de las 4 horas anteriores a [employer]. Siga las indicaciones del mdico en lo que respecta a cunto dormir antes del estudio. Si se le pide que limite cunto duerme, es posible que necesite que un adulto responsable lo lleve a la prueba y de regreso a Higher education careers adviser. Consulte al mdico si puede tomar los medicamentos que [place] habitualmente y los de [place], hierbas y suplementos. Qu ocurre durante el procedimiento? Le pedirn que se siente en una silla o se acueste. Le colocarn con cuidado varios discos pequeos de metal (electrodos) en la cabeza que se pegarn con un Immunologist. Puede llevar algn Lexicographer todos los electrodos en los puntos correctos para la prueba. Estos electrodos detectarn las seales del cerebro, y un Publishing copy. Durante el [place], pueden pedirle que: Se siente o se acueste tranquilo y relajado. Delsa [place] y  cierre los ojos. Respire profunda y rpidamente (hiperventile) durante 3 minutos o ms. Observe una luz que titila durante un breve cantidad de [place]. Lea texto o mire una imagen. Se duerma. Una vez finalizado el estudio, le retirarn los electrodos con una solucin, por [place], [place] o  [place]. Qu puedo esperar despus de la prueba? Despus de la prueba, es frecuente que no tenga efectos posteriores a la prueba. Debera poder continuar con sus actividades habituales de inmediato. Tenga en cuenta lo siguiente: Puede que tenga que baarse despus de la prueba para retirar el adhesivo utilizado para fijar los electrodos. [place] se retira fcilmente con el lavado. Si se produjo una convulsin durante el EEG, es posible que necesite ms [place] mdica dependiendo del tipo de convulsin y de su gravedad. Es su responsabilidad retirar [employer] del procedimiento. Pregntele a su mdico, o a un miembro del personal del departamento donde se realice el procedimiento, cundo estarn [employer]. Resumen Un electroencefalograma (EEG) es un estudio que se [place] frecuentemente para diagnosticar o supervisar problemas que estn relacionados con el cerebro. No consuma cafena dentro de las 4 horas anteriores a [employer]. Siga las dems instrucciones del mdico con respecto a dormir y los medicamentos antes de la prueba. Durante el procedimiento, Insurance claims handler discos de metal (electrodos) en la cabeza que se pegarn con un [place]. Durante el [place], es posible que le pidan que se siente o se acueste tranquilo y se relaje. Tambin se le puede pedir que realice otras actividades durante el [place], como mirar luces intermitentes o respirar rpidamente. Esta informacin no tiene Theme park manager el consejo del mdico. Asegrese de hacerle al mdico cualquier pregunta que tenga. Document Revised: 06/22/2020 Document Reviewed: 06/22/2020 Elsevier Patient Education  2023 [employer].

## 2022-08-03 NOTE — Progress Notes (Signed)
[facility] [address] [place]. [place] [address] [phone]       STROKE FOLLOW UP NOTE  Mr. MANDEL SEIDEN Date of Birth:  May 08, 1941 Medical Record Number:  [id number]    Primary neurologist: Dr. Pearlean Brownie Reason for Referral: stroke follow up    SUBJECTIVE:   CHIEF COMPLAINT:  Chief Complaint  Patient presents with   Follow-up    RM 3 with daughter Byrd Hesselbach & [place] interpreter Pt is well, daughter reports episode back in may. Was suppose to go see another Neurologist but did not FU with them. No side affects from recent TIA     HPI:   Update 08/03/2022 JM: Patient returns for stroke follow-up accompanied by daughter and [facility] interpreter after prior visit 9 months ago.  Patient evaluated 3/29 in ED after episode of acting abnormally, not speaking, and staring off which lasted only a few seconds and then completely resolved spontaneously. Per note, symptoms similar to prior stroke symptoms.  No definite seizure activity reported and no reoccurrence since initial episode. Patient reports memory of event unclear, he had pressured speech but unable to do so.  Reports compliance with warfarin although INR level subtherapeutic.  Of note, diagnosed with zoster rash to left torso and left neck the day prior and started on Valtrex.  Evaluated by neurologist Dr. Derry Lory, MR brain no acute stroke, unclear etiology of episode, possibly seizure and recommended outpatient EEG.   No additional episodes or new stroke/TIA symptoms since that time.  Reports residual right hand weakness with some improvement since prior visit.  Ambulates without assistive device, denies any recent falls.  Compliant on warfarin, denies side effects, closely followed by cardiology and Coumadin clinic with recent INR 2.5.  Blood pressure today 142/79.  Routinely follows with PCP.       History provided for reference purposes only Initial visit 09/29/2021 JM: Being seen for initial  hospital stroke follow up accompanied by daughter and [place] interpreter. Overall doing well since discharge. Currently working with [facility] PT - reports some improvement of right hand weakness - denies residual leg weakness.  Initially using RW but now no AD -denies any recent falls.  Reports hand tremor but this has been gradually improving - on tizanidine managed by PMR. Remains on warfarin without bleeding or bruising with prior INR 2.8 - has f/u scheduled 11/1 for repeat levels. Blood pressure today initially 160/84 (has not yet taken BP meds yet) and on recheck 138/88 - does not routinely monitor at home. Has since had f/u with cardiology - stress test 10/4 with reduced ER 29% though suspected falsely low - plans on completing cardiac CTA this afternoon and may need to proceed with cardiac cath if indicated.  No further concerns at this time.  Stroke admission 07/19/2021 Mr. EITHEN CASTIGLIA is a 81 y.o. male w/pmh of HTN who presented with 07/19/2021 with left gaze deviation, R facial droop and RUE weakness and aphasia.  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 Rt groin pseudoaneurysm post procedure s/p thrombin injection by VVS with repeat US showing resolution of pseudoaneurysm.  EF 50 to 55% with evidence of LV thrombus.  LDL 132.  A1c 5.9.  Initiated Coumadin and Lovenox bridge with INR goal 2-3 per cardiology recommendations.  Initiated atorvastatin 40 mg daily. Hx of HTN  not on medical therapy. Therapy evals recommended CIR for posterior lean, right knee instability, and RUE weakness.        ROS:   14 system review of systems performed and negative with exception of those listed in HPI  PMH:  Past Medical History:  Diagnosis Date   Hypertension     PSH:  Past Surgical History:   Procedure Laterality Date   APPENDECTOMY     in his 15's   CORONARY ANGIOGRAPHY N/A 10/20/2021   Procedure: CORONARY ANGIOGRAPHY;  Surgeon: Kathleene Hazel, MD;  Location: [facility] INVASIVE CV LAB;  Service: Cardiovascular;  Laterality: N/A;   IR CT HEAD LTD  07/19/2021   IR FLUORO GUIDED NEEDLE PLC ASPIRATION/INJECTION LOC  07/21/2021   IR PERCUTANEOUS ART THROMBECTOMY/INFUSION INTRACRANIAL INC DIAG ANGIO  07/19/2021   RADIOLOGY WITH ANESTHESIA N/A 07/19/2021   Procedure: IR WITH ANESTHESIA;  Surgeon: Julieanne Cotton, MD;  Location: [facility] OR;  Service: Radiology;  Laterality: N/A;    Social History:  Social History   Socioeconomic History   Marital status: Married    Spouse name: Not on file   Number of children: Not on file   Years of education: Not on file   Highest education level: Not on file  Occupational History   Not on file  Tobacco Use   Smoking status: Never   Smokeless tobacco: Never  Vaping Use   Vaping Use: Never used  Substance and Sexual Activity   Alcohol use: Not Currently    Alcohol/week: 1.0 standard drink of alcohol    Types: 1 Cans of beer per week   Drug use: Never   Sexual activity: Not on file  Other Topics Concern   Not on file  Social History Narrative   Not on file   Social Determinants of Health   Financial Resource Strain: Not on file  Food Insecurity: Not on file  Transportation Needs: Not on file  Physical Activity: Not on file  Stress: Not on file  Social Connections: Not on file  Intimate Partner Violence: Not on file    Family History:  Family History  Problem Relation Age of Onset   Cancer - Prostate Father     Medications:   Current Outpatient Medications on File Prior to Visit  Medication Sig Dispense Refill   acetaminophen (TYLENOL) 325 MG tablet Take 1-2 tablets (325-650 mg total) by mouth every 4 (four) hours as needed for mild pain.     amLODipine (NORVASC) 5 MG tablet Take 1 tablet (5 mg total) by mouth daily.  90 tablet 3   ASPIRIN LOW DOSE 81 MG tablet TAKE 1 TABLET(81 MG) BY MOUTH DAILY. SWALLOW WHOLE 90 tablet 1   atorvastatin (LIPITOR) 80 MG tablet Take 1 tablet (80 mg total) by mouth daily. 90 tablet 3   docusate sodium (COLACE) 100 MG capsule Take 1 capsule (100 mg total) by mouth 2 (two) times daily. (Patient taking differently: Take 100 mg by mouth daily.) 60 capsule 0   melatonin 3 MG TABS tablet Take 0.5 tablets (1.5 mg total) by mouth at bedtime. OVER THE COUNTER 30 tablet 0   metoprolol succinate (TOPROL-XL) 50 MG 24 hr tablet Take 1 tablet (50 mg total) by mouth at bedtime. 90 tablet 3   pantoprazole (PROTONIX) 40 MG tablet TAKE 1 TABLET(40 MG) BY MOUTH AT BEDTIME 90 tablet 1   polyethylene glycol (MIRALAX / GLYCOLAX) 17 g packet Take 17 g by mouth daily. 30 each 0   tamsulosin (FLOMAX) 0.4  MG CAPS capsule TAKE 1 CAPSULE(0.4 MG) BY MOUTH DAILY 90 capsule 0   tiZANidine (ZANAFLEX) 2 MG tablet TAKE 1 TABLET(2 MG) BY MOUTH AT BEDTIME 30 tablet 1   warfarin (COUMADIN) 6 MG tablet Take 1 tablet by mouth daily or as directed by Anticoagulation Clinic. 100 tablet 1   No current facility-administered medications on file prior to visit.    Allergies:  No Known Allergies    OBJECTIVE:  Physical Exam  Vitals:   08/03/22 1506  BP: (!) 142/79  Pulse: (!) 57  Weight: 170 lb (77.1 kg)  Height: 5\' 7"  (1.702 m)    Body mass index is 26.63 kg/m. No results found.  General: well developed, well nourished, very pleasant elderly Hispanic male, seated, in no evident distress Head: head normocephalic and atraumatic.   Neck: supple with no carotid or supraclavicular bruits Cardiovascular: regular rate and rhythm, no murmurs Musculoskeletal: no deformity Skin:  no rash/petichiae Vascular:  Normal pulses all extremities   Neurologic Exam Mental Status: Awake and fully alert.  Primarily Spanish-speaking -denies dysarthria or aphasia.  Oriented to place and time. Recent and remote memory  intact. Attention span, concentration and fund of knowledge appropriate. Mood and affect appropriate.  Cranial Nerves: Pupils equal, briskly reactive to light. Extraocular movements full without nystagmus. Visual fields full to confrontation. Hearing intact. Facial sensation intact. Face, tongue, palate moves normally and symmetrically.  Motor: Normal bulk and tone. Normal strength in all tested extremity muscles except mild weakness right hand grip strength and dexterity Sensory.:  Slight decreased light touch and vibratory sensation RUE; decreased sensory BLE.  Coordination: Rapid alternating movements normal in all extremities except decreased right hand. Finger-to-nose and heel-to-shin performed accurately bilaterally.  Orbits left arm over right arm.  Mild action tremor RUE with outstretched arms Gait and Station: Arises from chair without difficulty. Stance is normal. Gait demonstrates normal stride length and mild imbalance without use of assistive device. Tandem walk and heel toe with mild difficulty.  Reflexes: 1+ and symmetric. Toes downgoing.          ASSESSMENT: ZYIR GASSERT is a 81 y.o. 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 Vascular risk factors include HTN and HLD.  Evaluated in ED on 3/29 after episode of altered awareness and "staring off" that lasted for only short duration and resolved spontaneously with concern of possible seizure, of note recent medication changes around that time as well as diagnosis of herpes zoster and started on Valtrex.  No additional episodes since that time.     PLAN:  Left MCA strokes:  Residual deficit: mild right hand weakness with mild tremor, gradually improving since prior visit.  Encourage continued exercises at home. Continue warfarin daily, aspirin 81 mg daily and atorvastatin for  secondary stroke prevention and per cardiology recommendations Discussed secondary stroke prevention measures and importance of close PCP follow up for aggressive stroke risk factor management including BP goal<130/90, and HLD with LDL goal<70.  I have gone over the pathophysiology of stroke, warning signs and symptoms, risk factors and their management in some detail with instructions to go to the closest emergency room for symptoms of concern. Transient altered awareness: Possible partial seizure vs medication side effect.  Obtain EEG as he is at risk of seizure activity with hx of L MCA stroke. No additional episodes since that time. Will hold off  on antiseizure medication at this time but low threshold to initiate if any additional episodes should occur. LV thrombus: repeat echo 01/2022 resolution of thrombus but still akinetic apex, cardiology recommend continuation of warfarin.  Continue warfarin with INR goal 2-3.      Doing well from stroke standpoint without further recommendations and risk factors are managed by PCP. She may follow up PRN, as usual for our patients who are strictly being followed for stroke.  If EEG shows any concerning findings, will schedule follow-up visit at that time.  If any new neurological issues should arise, request PCP place referral for evaluation by one of our neurologists. Thank you.     CC:  PCP: [name], MD    I spent 34 minutes of face-to-face and non-face-to-face time with patient and daughter assisted by interpreter.  This included previsit chart review, lab review, study review, electronic health record documentation, order entry, patient and daughter education regarding prior stroke including etiology and residual deficits, secondary stroke prevention measures and importance of managing stroke risk factors, possible seizure-like activity and answered all other questions to patient and daughters satisfaction  Ihor Austin,  [facility]  [facility] [address] [place], [place] [address]  Phone [phone] Fax [phone] Note: This document was prepared with digital dictation and possible smart phrase technology. Any transcriptional errors that result from this process are unintentional.

## 2022-08-05 ENCOUNTER — Emergency Department (HOSPITAL_COMMUNITY): Payer: Medicare Other

## 2022-08-05 ENCOUNTER — Emergency Department (HOSPITAL_COMMUNITY)
Admission: EM | Admit: 2022-08-05 | Discharge: 2022-08-05 | Disposition: A | Discharge status: Home / Self Care | Payer: Medicare Other | Attending: Emergency Medicine | Admitting: Emergency Medicine

## 2022-08-05 ENCOUNTER — Other Ambulatory Visit: Payer: Self-pay

## 2022-08-05 DIAGNOSIS — Z7982 Long term (current) use of aspirin: Secondary | ICD-10-CM | POA: Diagnosis not present

## 2022-08-05 DIAGNOSIS — R55 Syncope and collapse: Secondary | ICD-10-CM | POA: Insufficient documentation

## 2022-08-05 DIAGNOSIS — Z7901 Long term (current) use of anticoagulants: Secondary | ICD-10-CM | POA: Diagnosis not present

## 2022-08-05 DIAGNOSIS — I10 Essential (primary) hypertension: Secondary | ICD-10-CM | POA: Insufficient documentation

## 2022-08-05 DIAGNOSIS — Z79899 Other long term (current) drug therapy: Secondary | ICD-10-CM | POA: Diagnosis not present

## 2022-08-05 DIAGNOSIS — I2510 Atherosclerotic heart disease of native coronary artery without angina pectoris: Secondary | ICD-10-CM | POA: Insufficient documentation

## 2022-08-05 DIAGNOSIS — R569 Unspecified convulsions: Secondary | ICD-10-CM | POA: Diagnosis present

## 2022-08-05 LAB — CBC WITH DIFFERENTIAL/PLATELET
Abs Immature Granulocytes: 0.02 10*3/uL (ref 0.00–0.07)
Basophils Absolute: 0.1 10*3/uL (ref 0.0–0.1)
Basophils Relative: 1 %
Eosinophils Absolute: 0.2 10*3/uL (ref 0.0–0.5)
Eosinophils Relative: 2 %
HCT: 39.5 % (ref 39.0–52.0)
Hemoglobin: 12.7 g/dL — ABNORMAL LOW (ref 13.0–17.0)
Immature Granulocytes: 0 %
Lymphocytes Relative: 17 %
Lymphs Abs: 1.3 10*3/uL (ref 0.7–4.0)
MCH: 31.8 pg (ref 26.0–34.0)
MCHC: 32.2 g/dL (ref 30.0–36.0)
MCV: 99 fL (ref 80.0–100.0)
Monocytes Absolute: 1 10*3/uL (ref 0.1–1.0)
Monocytes Relative: 13 %
Neutro Abs: 4.9 10*3/uL (ref 1.7–7.7)
Neutrophils Relative %: 67 %
Platelets: 241 10*3/uL (ref 150–400)
RBC: 3.99 MIL/uL — ABNORMAL LOW (ref 4.22–5.81)
RDW: 13.2 % (ref 11.5–15.5)
WBC: 7.4 10*3/uL (ref 4.0–10.5)
nRBC: 0 % (ref 0.0–0.2)

## 2022-08-05 LAB — COMPREHENSIVE METABOLIC PANEL
ALT: 30 U/L (ref 0–44)
AST: 27 U/L (ref 15–41)
Albumin: 3.5 g/dL (ref 3.5–5.0)
Alkaline Phosphatase: 67 U/L (ref 38–126)
Anion gap: 14 (ref 5–15)
BUN: 31 mg/dL — ABNORMAL HIGH (ref 8–23)
CO2: 20 mmol/L — ABNORMAL LOW (ref 22–32)
Calcium: 9 mg/dL (ref 8.9–10.3)
Chloride: 108 mmol/L (ref 98–111)
Creatinine, Ser: 1.75 mg/dL — ABNORMAL HIGH (ref 0.61–1.24)
GFR, Estimated: 39 mL/min — ABNORMAL LOW (ref >60)
Glucose, Bld: 131 mg/dL — ABNORMAL HIGH (ref 70–99)
Potassium: 4 mmol/L (ref 3.5–5.1)
Sodium: 142 mmol/L (ref 135–145)
Total Bilirubin: 0.9 mg/dL (ref 0.3–1.2)
Total Protein: 6.5 g/dL (ref 6.5–8.1)

## 2022-08-05 LAB — PROTIME-INR
INR: 2.6 — ABNORMAL HIGH (ref 0.8–1.2)
Prothrombin Time: 27.9 seconds — ABNORMAL HIGH (ref 11.4–15.2)

## 2022-08-05 LAB — TROPONIN I (HIGH SENSITIVITY)
Troponin I (High Sensitivity): 10 ng/L (ref <18)
Troponin I (High Sensitivity): 11 ng/L (ref <18)

## 2022-08-05 MED ORDER — LEVETIRACETAM 500 MG PO TABS
500.0000 mg | ORAL_TABLET | Freq: Once | ORAL | Status: AC
  Administered 2022-08-05: 500 mg via ORAL
  Filled 2022-08-05: qty 1

## 2022-08-05 MED ORDER — LEVETIRACETAM 500 MG PO TABS: 500.0000 mg | ORAL_TABLET | Freq: Two times a day (BID) | ORAL | 2 refills | Status: DC

## 2022-08-05 NOTE — ED Provider Notes (Signed)
[facility] EMERGENCY DEPARTMENT Provider Note   CSN: [id number] Arrival date & time: 08/05/22  1035     History  Chief Complaint  Patient presents with   Near Syncope    Roberto Dickson is a 81 y.o. male.  Patient's family reports he had an episode just for arrival where he slumped to the right and stared off for approximately 2 minutes.  Patient's daughter states that he had a stroke a year ago.  He has had similar episodes to this in the past however today lasted longer.  Patient was seen by his neurologist on Thursday who was concerned that he could be having seizures due to his previous stroke.  Patient was scheduled for a EEG in February for further evaluation.  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  Patient denies any current symptoms or discomfort he reports he did have some soreness in his lower extremities this a.m. but that has resolved  The history is provided by the patient and the spouse. The history is limited by a language barrier.  Near Syncope This is a new problem. The problem has not changed since onset.He has tried nothing for the symptoms. The treatment provided no relief.       Home Medications Prior to Admission medications   Medication Sig Start Date End Date Taking? Authorizing Provider  acetaminophen (TYLENOL) 325 MG tablet Take 1-2 tablets (325-650 mg total) by mouth every 4 (four) hours as needed for mild pain. 08/10/21   Love, Evlyn Kanner, PA-C  amLODipine (NORVASC) 5 MG tablet Take 1 tablet (5 mg total) by mouth daily. 02/27/22   [name], MD  ASPIRIN LOW DOSE 81 MG tablet TAKE 1 TABLET(81 MG) BY MOUTH DAILY. SWALLOW WHOLE 05/02/22   Chilton Si, MD  atorvastatin (LIPITOR) 80 MG tablet Take 1 tablet (80 mg total) by mouth daily. 02/09/22   [name], MD  docusate sodium (COLACE) 100  MG capsule Take 1 capsule (100 mg total) by mouth 2 (two) times daily. Patient taking differently: Take 100 mg by mouth daily. 08/10/21   Love, Evlyn Kanner, PA-C  melatonin 3 MG TABS tablet Take 0.5 tablets (1.5 mg total) by mouth at bedtime. OVER THE COUNTER 08/10/21   Love, Evlyn Kanner, PA-C  metoprolol succinate (TOPROL-XL) 50 MG 24 hr tablet Take 1 tablet (50 mg total) by mouth at bedtime. 02/09/22   [name], MD  pantoprazole (PROTONIX) 40 MG tablet TAKE 1 TABLET(40 MG) BY MOUTH AT BEDTIME 03/16/22   Sagardia, Eilleen Kempf, MD  polyethylene glycol (MIRALAX / GLYCOLAX) 17 g packet Take 17 g by mouth daily. 08/11/21   Love, Evlyn Kanner, PA-C  tamsulosin (FLOMAX) 0.4 MG CAPS capsule TAKE 1 CAPSULE(0.4 MG) BY MOUTH DAILY 07/18/22   [name], MD  tiZANidine (ZANAFLEX) 2 MG tablet TAKE 1 TABLET(2 MG) BY MOUTH AT BEDTIME 05/30/22   Kirsteins, Victorino Sparrow, MD  warfarin (COUMADIN) 6 MG tablet Take 1 tablet by mouth daily or as directed by Anticoagulation Clinic. 03/31/22   Jodelle Red, MD      Allergies    Patient has no known allergies.    Review of Systems   Review of Systems  Cardiovascular:  Positive for near-syncope.  All other systems reviewed and are negative.   Physical Exam Updated Vital Signs BP 106/63   Pulse 81   Temp 98.3  F (36.8 C) (Oral)   Resp (!) 30   Ht 5\' 7"  (1.702 m)   Wt 77 kg   SpO2 92%   BMI 26.59 kg/m  Physical Exam  ED Results / Procedures / Treatments   Labs (all labs ordered are listed, but only abnormal results are displayed) Labs Reviewed  CBC WITH DIFFERENTIAL/PLATELET  COMPREHENSIVE METABOLIC PANEL  PROTIME-INR  URINALYSIS, ROUTINE W REFLEX MICROSCOPIC  TROPONIN I (HIGH SENSITIVITY)    EKG EKG Interpretation  Date/Time:  Saturday August 05 2022 10:36:48 EDT Ventricular Rate:  96 PR Interval:    QRS Duration: 88 QT Interval:  299 QTC Calculation: 378 R Axis:   -34 Text Interpretation: Atrial fibrillation Premature  ventricular complexes Confirmed by [id number] [id number]) on 08/05/2022 10:41:23 AM  Radiology No results found.  Procedures Procedures    Medications Ordered in ED Medications - No data to display  ED Course/ Medical Decision Making/ A&P                           Medical Decision Making Pt has episodes of unresponsiveness  Amount and/or Complexity of Data Reviewed Independent Historian: spouse    Details: Pt here with wife and daughter who provide most of history  External Data Reviewed: notes.    Details: Notes from [facility] reviewed  Labs: ordered. Decision-making details documented in ED Course.    Details: Labs ordered reviewed and interpreted  Radiology: ordered and independent interpretation performed. Decision-making details documented in ED Course.    Details: Ct head  no acute changes  ECG/medicine tests: ordered and independent interpretation performed. Decision-making details documented in ED Course.    Details: EKg  no acute  Discussion of management or test interpretation with external provider(s): I discussed pt with Dr. 10/05/2022 Neurologist on call.  He advised to start pt on Keppra 500mg  bid    Risk Prescription drug management. Risk Details: Pt and famioy counseled about concern for seizures and treatment.  Pt advised to call Neurologist this week to schedule follow up           Final Clinical Impression(s) / ED Diagnoses Final diagnoses:  Seizures (HCC)    Rx / DC Orders ED Discharge Orders          Ordered    levETIRAcetam (KEPPRA) 500 MG tablet  2 times daily        08/05/22 1502          An After Visit Summary was printed and given to the patient.     08/05/22 1532    Osie Cheeks, MD 08/05/22 [phone]

## 2022-08-05 NOTE — ED Triage Notes (Signed)
[name] from home after having a reported near syncopal episode in the shower. Per EMS there was no fall reported however patient was complaining of neck pain enroute to hospital. Upon initial assessment BP was 70 palpated., HR in the 50s w/ multifocal PVCs. Eventually BP did improve to 124/68 during transport without intervention. During transport EMS noted what could be a possible absence seizure for about 2 minutes. Patient is being seen by neurology for same issue.   VS: 124/68  HR :70s  SpO2:98% on RA  CBG 144

## 2022-08-05 NOTE — Discharge Instructions (Addendum)
Return if any problems.

## 2022-08-06 ENCOUNTER — Encounter: Payer: Self-pay | Admitting: Emergency Medicine

## 2022-08-08 ENCOUNTER — Other Ambulatory Visit: Payer: Self-pay | Admitting: Emergency Medicine

## 2022-08-08 MED ORDER — TIZANIDINE HCL 2 MG PO TABS: 2.0000 mg | ORAL_TABLET | Freq: Every evening | ORAL | 1 refills | Status: DC | PRN

## 2022-08-08 NOTE — Telephone Encounter (Signed)
No problem.  New prescription sent to pharmacy of record.

## 2022-08-14 ENCOUNTER — Ambulatory Visit: Payer: Medicare Other | Admitting: Emergency Medicine

## 2022-08-17 ENCOUNTER — Ambulatory Visit: Payer: Medicare Other | Attending: Family

## 2022-08-17 DIAGNOSIS — I63512 Cerebral infarction due to unspecified occlusion or stenosis of left middle cerebral artery: Secondary | ICD-10-CM | POA: Insufficient documentation

## 2022-08-17 DIAGNOSIS — Z5181 Encounter for therapeutic drug level monitoring: Secondary | ICD-10-CM | POA: Diagnosis not present

## 2022-08-17 DIAGNOSIS — I513 Intracardiac thrombosis, not elsewhere classified: Secondary | ICD-10-CM | POA: Insufficient documentation

## 2022-08-17 LAB — POCT INR: INR: 2.3 (ref 2.0–3.0)

## 2022-08-17 NOTE — Patient Instructions (Signed)
Continue taking Warfarin 1 tablet daily except 1/2 tablet on Fridays. Recheck INR in 7 weeks. Coumadin Clinic [phone] or [phone]

## 2022-08-20 ENCOUNTER — Other Ambulatory Visit: Payer: Self-pay | Admitting: Emergency Medicine

## 2022-08-24 ENCOUNTER — Ambulatory Visit (INDEPENDENT_AMBULATORY_CARE_PROVIDER_SITE_OTHER): Payer: Medicare Other | Admitting: Emergency Medicine

## 2022-08-24 ENCOUNTER — Encounter: Payer: Self-pay | Admitting: Emergency Medicine

## 2022-08-24 VITALS — BP 120/74 | HR 56 | Temp 97.9°F | Ht 67.0 in | Wt 172.5 lb

## 2022-08-24 DIAGNOSIS — I10 Essential (primary) hypertension: Secondary | ICD-10-CM

## 2022-08-24 DIAGNOSIS — Z87898 Personal history of other specified conditions: Secondary | ICD-10-CM

## 2022-08-24 DIAGNOSIS — I63512 Cerebral infarction due to unspecified occlusion or stenosis of left middle cerebral artery: Secondary | ICD-10-CM | POA: Diagnosis not present

## 2022-08-24 DIAGNOSIS — I25118 Atherosclerotic heart disease of native coronary artery with other forms of angina pectoris: Secondary | ICD-10-CM

## 2022-08-24 DIAGNOSIS — I513 Intracardiac thrombosis, not elsewhere classified: Secondary | ICD-10-CM | POA: Diagnosis not present

## 2022-08-24 DIAGNOSIS — Z8673 Personal history of transient ischemic attack (TIA), and cerebral infarction without residual deficits: Secondary | ICD-10-CM

## 2022-08-24 DIAGNOSIS — E785 Hyperlipidemia, unspecified: Secondary | ICD-10-CM

## 2022-08-24 HISTORY — DX: Personal history of other specified conditions: Z87.898

## 2022-08-24 NOTE — Assessment & Plan Note (Signed)
Recent seizure believed to be secondary to multiple cerebral infarcts in the past. Recently started on Keppra 500 mg twice a day.

## 2022-08-24 NOTE — Assessment & Plan Note (Signed)
Well-controlled hypertension. BP Readings from Last 3 Encounters:  08/24/22 120/74  08/05/22 114/72  08/03/22 (!) 142/79  Continue amlodipine 5 mg and losartan 50 mg daily.

## 2022-08-24 NOTE — Progress Notes (Signed)
Roberto Dickson 81 y.o.   Chief Complaint  Patient presents with   Follow-up    f/u appt, patient taking new medication for seizures,  Patient having neck pain from having shingles,     HISTORY OF PRESENT ILLNESS: This is a 81 y.o. male here for 32-month follow-up of chronic medical problems Seen in the emergency room recently on 08/05/2022 after he had a seizure at home. Started on Keppra Recently had shingles with left-sided neck pain. Overall doing well. No other comp points or medical concerns today. Most recent emergency department visit notes as follows: Attestation signed by Gloris Manchester, MD at 08/05/2022  6:06 PM   Attestation: I provided a substantive portion of the care of this patient.  I personally performed the entirety of the medical decision making for this encounter. Patient with history of CVA presenting for episodes concerning for TIA and/or absence seizure's.  Work-up is reassuring.  Case was discussed with neurology recommends initiation of AEDs.  He is stable for discharge with outpatient follow-up. EKG Interpretation   Date/Time:                  Saturday August 05 2022 10:36:48 EDT Ventricular Rate:         96 PR Interval:                   QRS Duration: 88 QT Interval:                 299 QTC Calculation:        378 R Axis:                         -34 Text Interpretation:      Atrial fibrillation Premature ventricular complexes Confirmed by Gloris Manchester ([id number]) on 08/05/2022 10:41:23 AM            HPI   Prior to Admission medications   Medication Sig Start Date End Date Taking? Authorizing Provider  acetaminophen (TYLENOL) 325 MG tablet Take 1-2 tablets (325-650 mg total) by mouth every 4 (four) hours as needed for mild pain. 08/10/21  Yes Love, Evlyn Kanner, PA-C  amLODipine (NORVASC) 5 MG tablet Take 1 tablet (5 mg total) by mouth daily. 02/27/22  Yes Jenni Thew, Eilleen Kempf, MD  ASPIRIN LOW DOSE 81 MG tablet TAKE 1 TABLET(81 MG) BY MOUTH DAILY. SWALLOW  WHOLE Patient taking differently: Take 81 mg by mouth daily. 05/02/22  Yes Chilton Si, MD  atorvastatin (LIPITOR) 80 MG tablet Take 1 tablet (80 mg total) by mouth daily. 02/09/22  Yes Amanda Steuart, Eilleen Kempf, MD  docusate sodium (COLACE) 100 MG capsule Take 1 capsule (100 mg total) by mouth 2 (two) times daily. Patient taking differently: Take 100 mg by mouth daily. 08/10/21  Yes Love, Evlyn Kanner, PA-C  levETIRAcetam (KEPPRA) 500 MG tablet Take 1 tablet (500 mg total) by mouth 2 (two) times daily. 08/05/22  Yes Cheron Schaumann K, PA-C  losartan (COZAAR) 50 MG tablet Take 50 mg by mouth daily. 07/31/22  Yes [provider]  melatonin 3 MG TABS tablet Take 0.5 tablets (1.5 mg total) by mouth at bedtime. OVER THE COUNTER Patient taking differently: Take 1.5 mg by mouth at bedtime. 08/10/21  Yes Love, Evlyn Kanner, PA-C  metoprolol succinate (TOPROL-XL) 50 MG 24 hr tablet Take 1 tablet (50 mg total) by mouth at bedtime. 02/09/22  Yes [name], MD  pantoprazole (PROTONIX) 40 MG tablet TAKE 1  TABLET(40 MG) BY MOUTH AT BEDTIME 08/20/22  Yes Jovani Colquhoun, Eilleen Kempf, MD  tamsulosin (FLOMAX) 0.4 MG CAPS capsule TAKE 1 CAPSULE(0.4 MG) BY MOUTH DAILY Patient taking differently: Take 0.4 mg by mouth daily. 07/18/22  Yes Unika Nazareno, Eilleen Kempf, MD  tiZANidine (ZANAFLEX) 2 MG tablet Take 1 tablet (2 mg total) by mouth at bedtime as needed for muscle spasms. TAKE 1 TABLET(2 MG) BY MOUTH AT BEDTIME Strength: 2 mg 08/08/22  Yes Chiann Goffredo, Eilleen Kempf, MD  warfarin (COUMADIN) 6 MG tablet Take 1 tablet by mouth daily or as directed by Anticoagulation Clinic. Patient taking differently: Take 6 mg by mouth See admin instructions. Take 1 tablet (6 mg) by mouth Sunday, Monday, Tuesday, Wednesday, Thursday and Saturday and then take one-half tablet (3 mg) on Friday or as directed by Anticoagulation Clinic. 03/31/22  Yes Jodelle Red, MD    No Known Allergies  Patient Active Problem List   Diagnosis Date Noted    Recent cerebrovascular accident (CVA) 11/09/2021   Hyperlipidemia 10/21/2021   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   Coronary artery disease of native artery of native heart with stable angina pectoris (HCC)    [place], post-stroke 08/25/2021   Abnormality of gait 08/25/2021   Encounter for therapeutic drug monitoring 08/12/2021   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/12/2021   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   AKI (acute kidney injury) (HCC)    Sleep disturbance    Acute blood loss anemia    Essential hypertension    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HCC) 07/26/2021   Pressure injury of skin 07/24/2021   Acute ischemic left MCA stroke (HCC) 07/19/2021   Internal carotid artery occlusion, 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    Past Medical History:  Diagnosis Date   Hypertension     Past Surgical History:  Procedure Laterality Date   APPENDECTOMY     in his 54's   CORONARY ANGIOGRAPHY N/A 10/20/2021   Procedure: CORONARY ANGIOGRAPHY;  Surgeon: Kathleene Hazel, MD;  Location: [facility];  Service: Cardiovascular;  Laterality: N/A;   IR CT HEAD LTD  07/19/2021   IR FLUORO GUIDED NEEDLE PLC ASPIRATION/INJECTION LOC  07/21/2021   IR PERCUTANEOUS ART THROMBECTOMY/INFUSION INTRACRANIAL INC DIAG ANGIO  07/19/2021   RADIOLOGY WITH ANESTHESIA N/A 07/19/2021   Procedure: IR WITH ANESTHESIA;  Surgeon: Julieanne Cotton, MD;  Location: [facility] OR;  Service: Radiology;  Laterality: N/A;    Social History   Socioeconomic History   Marital status: Married    Spouse name: Not on file   Number of children: Not on file   Years of education: Not on file   Highest education level: Not on file  Occupational History   Not on file  Tobacco Use   Smoking status: Never   Smokeless tobacco: Never  Vaping Use   Vaping Use: Never used  Substance and Sexual Activity   Alcohol use: Not Currently    Alcohol/week: 1.0 standard drink of alcohol    Types: 1 Cans of beer per  week   Drug use: Never   Sexual activity: Not on file  Other Topics Concern   Not on file  Social History Narrative   Not on file   Social Determinants of Health   Financial Resource Strain: Not on file  Food Insecurity: Not on file  Transportation Needs: Not on file  Physical Activity: Not on file  Stress: Not on file  Social Connections: Not on file  Intimate Partner Violence: Not on file  Family History  Problem Relation Age of Onset   Cancer - Prostate Father      Review of Systems  Constitutional: Negative.  Negative for chills and fever.  HENT: Negative.  Negative for congestion and sore throat.   Respiratory: Negative.  Negative for cough and shortness of breath.   Cardiovascular: Negative.  Negative for chest pain and palpitations.  Gastrointestinal:  Negative for abdominal pain, diarrhea, nausea and vomiting.  Genitourinary: Negative.   Skin: Negative.  Negative for rash.  Neurological: Negative.  Negative for dizziness and headaches.  All other systems reviewed and are negative.   Today's Vitals   08/24/22 1529  BP: 120/74  Pulse: (!) 56  Temp: 97.9 F (36.6 C)  TempSrc: Oral  SpO2: 93%  Weight: 172 lb 8 oz (78.2 kg)  Height: 5\' 7"  (1.702 m)   Body mass index is 27.02 kg/m.  Physical Exam Vitals reviewed.  Constitutional:      Appearance: Normal appearance.  HENT:     Head: Normocephalic.     Mouth/Throat:     Mouth: Mucous membranes are moist.     Pharynx: Oropharynx is clear.  Eyes:     Extraocular Movements: Extraocular movements intact.     Conjunctiva/sclera: Conjunctivae normal.     Pupils: Pupils are equal, round, and reactive to light.  Cardiovascular:     Rate and Rhythm: Normal rate and regular rhythm.     Pulses: Normal pulses.     Heart sounds: Normal heart sounds.  Pulmonary:     Effort: Pulmonary effort is normal.     Breath sounds: Normal breath sounds.  Abdominal:     Palpations: Abdomen is soft.     Tenderness:  There is no abdominal tenderness.  Musculoskeletal:     Cervical back: No tenderness.     Right lower leg: No edema.     Left lower leg: No edema.  Lymphadenopathy:     Cervical: No cervical adenopathy.  Skin:    General: Skin is warm and dry.     Capillary Refill: Capillary refill takes less than 2 seconds.  Neurological:     General: No focal deficit present.     Mental Status: He is alert and oriented to person, place, and time.  Psychiatric:        Mood and Affect: Mood normal.        Behavior: Behavior normal.      ASSESSMENT & PLAN: A total of 47 minutes was spent with the patient and counseling/coordination of care regarding preparing for this visit, review of most recent office visit notes, review of most recent emergency department visit notes, review of most recent CT scan of brain report, review of most recent blood work results, review of multiple chronic medical problems and their management, review of all medications, long-term anticoagulation concerns and fall precautions, prognosis, documentation, need for follow-up.  Problem List Items Addressed This Visit       Cardiovascular and Mediastinum   Essential hypertension - Primary    Well-controlled hypertension. BP Readings from Last 3 Encounters:  08/24/22 120/74  08/05/22 114/72  08/03/22 (!) 142/79  Continue amlodipine 5 mg and losartan 50 mg daily.       Left ventricular thrombus    Stable.  On Coumadin.      Coronary artery disease of native artery of native heart with stable angina pectoris (HCC)     Other   Hyperlipidemia    Diet and nutrition discussed.  Continue atorvastatin  80 mg daily.      History of seizure    Recent seizure believed to be secondary to multiple cerebral infarcts in the past. Recently started on Keppra 500 mg twice a day.      Other Visit Diagnoses     History of stroke          Patient Instructions  Mantenimiento de la salud despus de los 41 aos de edad Health  Maintenance After Age 26 Despus de los 65 aos de edad, corre un riesgo mayor de Tourist information centre manager enfermedades e infecciones a Barrister's clerk, como tambin de sufrir lesiones por cadas. Las cadas son la causa principal de las fracturas de huesos y lesiones en la cabeza de personas mayores de 34 aos de edad. Recibir cuidados preventivos de forma regular puede ayudarlo a mantenerse saludable y en buen [place]. Los cuidados preventivos incluyen realizarse anlisis de forma regular y Actor en el estilo de vida segn las recomendaciones del mdico. Converse con el mdico sobre lo siguiente: Las pruebas de deteccin y los anlisis que debe Dispensing optician. Una prueba de deteccin es un estudio que se para Hydrographic surveyor la presencia de una enfermedad cuando no tiene sntomas. Un plan de dieta y ejercicios adecuado para usted. Qu debo saber sobre las pruebas de deteccin y los anlisis para prevenir cadas? Realizarse pruebas de deteccin y [employer] es la mejor manera de Hydrographic surveyor un problema de salud de forma temprana. El diagnstico y tratamiento tempranos le brindan la mejor oportunidad de Chief Technology Officer las afecciones mdicas que son comunes despus de los 19 aos de edad. Ciertas afecciones y elecciones de estilo de vida pueden hacer que sea ms propenso a sufrir Engineer, manufacturing. El mdico puede recomendarle lo siguiente: Controles regulares de la visin. Una visin deficiente y afecciones como las cataratas pueden hacer que sea ms propenso a sufrir Engineer, manufacturing. Si [place] lentes, asegrese de obtener una receta actualizada si su visin cambia. Revisin de medicamentos. Revise regularmente con el mdico todos los medicamentos que toma, incluidos los medicamentos de [place]. Consulte al [employer] efectos secundarios que pueden hacer que sea ms propenso a sufrir Engineer, manufacturing. Informe al mdico si alguno de los medicamentos que toma lo hace sentir mareado o somnoliento. Controles de fuerza y equilibrio. El mdico puede  recomendar ciertos estudios para controlar su fuerza y equilibrio al estar de pie, al caminar o al cambiar de posicin. Examen de los pies. El dolor y Chiropractor en los pies, como tambin no utilizar el calzado [place], pueden hacer que sea ms propenso a sufrir Engineer, manufacturing. Pruebas de deteccin, que incluyen las siguientes: Pruebas de deteccin para la osteoporosis. La osteoporosis es una afeccin que hace que los huesos se tornen ms dbiles y se quiebren con ms facilidad. Pruebas de deteccin para la presin arterial. Los cambios en la presin arterial y los medicamentos para Chief Technology Officer la presin arterial pueden hacerlo sentir mareado. Prueba de deteccin de la depresin. Es ms probable que sufra una cada si tiene miedo a caerse, se siente deprimido o se siente incapaz de Patent examiner. Prueba de deteccin de consumo de alcohol. Beber demasiado alcohol puede afectar su equilibrio y puede hacer que sea ms propenso a sufrir Engineer, manufacturing. Siga estas indicaciones en su casa: Estilo de vida No beba alcohol si: Su mdico le indica no hacerlo. Si bebe alcohol: Limite la cantidad que bebe a lo siguiente: De 0 a 1 medida por da para las mujeres. De 0 a  2 medidas por da para los hombres. Sepa cunta cantidad de alcohol hay en las bebidas que toma. En los [address], una medida equivale a una botella de cerveza de 12 oz (355 ml), un vaso de vino de 5 oz (148 ml) o un vaso de una bebida alcohlica de alta graduacin de 1 oz (44 ml). No consuma ningn producto que contenga nicotina o tabaco. Estos productos incluyen cigarrillos, tabaco para Theatre manager y aparatos de vapeo, como los Administrator, Civil Service. Si necesita ayuda para dejar de consumir estos productos, consulte al [employer]. Actividad  Siga un programa de ejercicio regular para mantenerse en forma. Esto lo ayudar a Radio producer equilibrio. Consulte al mdico qu tipos de ejercicios son adecuados para usted. Si  necesita un bastn o un andador, selo segn las recomendaciones del mdico. Utilice calzado con buen apoyo y suela antideslizante. Seguridad  Retire los [employer] puedan causar tropiezos tales como alfombras, cables u obstculos. Instale equipos de seguridad, como barras para sostn en los baos y barandas de seguridad en las escaleras. Mantenga las habitaciones y los pasillos bien iluminados. Indicaciones generales Hable con el mdico sobre sus riesgos de sufrir una cada. Infrmele a su mdico si: Se cae. Asegrese de informarle a su mdico acerca de todas las cadas, incluso aquellas que parecen ser [employer]. Se siente mareado, cansado (tiene fatiga) o siente que pierde el equilibrio. Use los medicamentos de venta libre y los recetados solamente como se lo haya indicado el mdico. Estos incluyen suplementos. Siga una dieta sana y [place] un peso saludable. Una dieta saludable incluye productos lcteos descremados, carnes bajas en contenido de grasa ([place]), fibra de granos enteros, frijoles y [place] frutas y verduras. Mantngase al da con las vacunas. Realcese los estudios de rutina de la salud, dentales y de Wellsite geologist. Resumen Tener un estilo de vida saludable y recibir cuidados preventivos pueden ayudar a Research scientist (physical sciences) salud y el bienestar despus de los 65 aos de [place]. Realizarse pruebas de deteccin y [employer] es la mejor manera de Engineer, manufacturing un problema de salud de forma temprana y [place] a Automotive engineer una cada. El diagnstico y tratamiento tempranos le brindan la mejor oportunidad de Chief Operating Officer las afecciones mdicas ms comunes en las personas mayores de 65 aos de edad. Las cadas son la causa principal de las fracturas de huesos y lesiones en la cabeza de personas mayores de 65 aos de edad. Tome precauciones para evitar una cada en su casa. Trabaje con el mdico para saber qu cambios que puede hacer para mejorar su salud y [place], y para prevenir las cadas. Esta informacin no  tiene Theme park manager el consejo del mdico. Asegrese de hacerle al mdico cualquier pregunta que tenga. Document Revised: 04/27/2021 Document Reviewed: 04/27/2021 Elsevier Patient Education  2023 Elsevier Inc.    Edwina Barth, MD [facility]

## 2022-08-24 NOTE — Patient Instructions (Signed)
Mantenimiento de la salud despus de los 65 aos de edad Health Maintenance After Age 79 Despus de los 65 aos de edad, corre un riesgo mayor de padecer ciertas enfermedades e infecciones a largo plazo, como tambin de sufrir lesiones por cadas. Las cadas son la causa principal de las fracturas de huesos y lesiones en la cabeza de personas mayores de 65 aos de edad. Recibir cuidados preventivos de forma regular puede ayudarlo a mantenerse saludable y en buen estado. Los cuidados preventivos incluyen realizarse anlisis de forma regular y realizar cambios en el estilo de vida segn las recomendaciones del mdico. Converse con el mdico sobre lo siguiente: Las pruebas de deteccin y los anlisis que debe realizarse. Una prueba de deteccin es un estudio que se para detectar la presencia de una enfermedad cuando no tiene sntomas. Un plan de dieta y ejercicios adecuado para usted. Qu debo saber sobre las pruebas de deteccin y los anlisis para prevenir cadas? Realizarse pruebas de deteccin y anlisis es la mejor manera de detectar un problema de salud de forma temprana. El diagnstico y tratamiento tempranos le brindan la mejor oportunidad de controlar las afecciones mdicas que son comunes despus de los 65 aos de edad. Ciertas afecciones y elecciones de estilo de vida pueden hacer que sea ms propenso a sufrir una cada. El mdico puede recomendarle lo siguiente: Controles regulares de la visin. Una visin deficiente y afecciones como las cataratas pueden hacer que sea ms propenso a sufrir una cada. Si usa lentes, asegrese de obtener una receta actualizada si su visin cambia. Revisin de medicamentos. Revise regularmente con el mdico todos los medicamentos que toma, incluidos los medicamentos de venta libre. Consulte al mdico sobre los efectos secundarios que pueden hacer que sea ms propenso a sufrir una cada. Informe al mdico si alguno de los medicamentos que toma lo hace sentir mareado o  somnoliento. Controles de fuerza y equilibrio. El mdico puede recomendar ciertos estudios para controlar su fuerza y equilibrio al estar de pie, al caminar o al cambiar de posicin. Examen de los pies. El dolor y el adormecimiento en los pies, como tambin no utilizar el calzado adecuado, pueden hacer que sea ms propenso a sufrir una cada. Pruebas de deteccin, que incluyen las siguientes: Pruebas de deteccin para la osteoporosis. La osteoporosis es una afeccin que hace que los huesos se tornen ms dbiles y se quiebren con ms facilidad. Pruebas de deteccin para la presin arterial. Los cambios en la presin arterial y los medicamentos para controlar la presin arterial pueden hacerlo sentir mareado. Prueba de deteccin de la depresin. Es ms probable que sufra una cada si tiene miedo a caerse, se siente deprimido o se siente incapaz de realizar actividades que sola hacer. Prueba de deteccin de consumo de alcohol. Beber demasiado alcohol puede afectar su equilibrio y puede hacer que sea ms propenso a sufrir una cada. Siga estas indicaciones en su casa: Estilo de vida No beba alcohol si: Su mdico le indica no hacerlo. Si bebe alcohol: Limite la cantidad que bebe a lo siguiente: De 0 a 1 medida por da para las mujeres. De 0 a 2 medidas por da para los hombres. Sepa cunta cantidad de alcohol hay en las bebidas que toma. En los [place], una medida equivale a una botella de cerveza de 12 oz (355 ml), un vaso de vino de 5 oz (148 ml) o un vaso de una bebida alcohlica de alta graduacin de 1 oz (44 ml). No consuma ningn producto que   contenga nicotina o tabaco. Estos productos incluyen cigarrillos, tabaco para mascar y aparatos de vapeo, como los cigarrillos electrnicos. Si necesita ayuda para dejar de consumir estos productos, consulte al mdico. Actividad  Siga un programa de ejercicio regular para mantenerse en forma. Esto lo ayudar a mantener el equilibrio. Consulte al  mdico qu tipos de ejercicios son adecuados para usted. Si necesita un bastn o un andador, selo segn las recomendaciones del mdico. Utilice calzado con buen apoyo y suela antideslizante. Seguridad  Retire los objetos que puedan causar tropiezos tales como alfombras, cables u obstculos. Instale equipos de seguridad, como barras para sostn en los baos y barandas de seguridad en las escaleras. Mantenga las habitaciones y los pasillos bien iluminados. Indicaciones generales Hable con el mdico sobre sus riesgos de sufrir una cada. Infrmele a su mdico si: Se cae. Asegrese de informarle a su mdico acerca de todas las cadas, incluso aquellas que parecen ser menores. Se siente mareado, cansado (tiene fatiga) o siente que pierde el equilibrio. Use los medicamentos de venta libre y los recetados solamente como se lo haya indicado el mdico. Estos incluyen suplementos. Siga una dieta sana y mantenga un peso saludable. Una dieta saludable incluye productos lcteos descremados, carnes bajas en contenido de grasa (magras), fibra de granos enteros, frijoles y muchas frutas y verduras. Mantngase al da con las vacunas. Realcese los estudios de rutina de la salud, dentales y de la vista. Resumen Tener un estilo de vida saludable y recibir cuidados preventivos pueden ayudar a promover la salud y el bienestar despus de los 65 aos de edad. Realizarse pruebas de deteccin y anlisis es la mejor manera de detectar un problema de salud de forma temprana y ayudarlo a evitar una cada. El diagnstico y tratamiento tempranos le brindan la mejor oportunidad de controlar las afecciones mdicas ms comunes en las personas mayores de 65 aos de edad. Las cadas son la causa principal de las fracturas de huesos y lesiones en la cabeza de personas mayores de 65 aos de edad. Tome precauciones para evitar una cada en su casa. Trabaje con el mdico para saber qu cambios que puede hacer para mejorar su salud y  bienestar, y para prevenir las cadas. Esta informacin no tiene como fin reemplazar el consejo del mdico. Asegrese de hacerle al mdico cualquier pregunta que tenga. Document Revised: 04/27/2021 Document Reviewed: 04/27/2021 Elsevier Patient Education  2023 Elsevier Inc.  

## 2022-08-24 NOTE — Assessment & Plan Note (Signed)
Stable.  On Coumadin.

## 2022-08-24 NOTE — Assessment & Plan Note (Signed)
Diet and nutrition discussed.  Continue atorvastatin 80 mg daily. 

## 2022-08-30 ENCOUNTER — Encounter: Payer: Self-pay | Admitting: Emergency Medicine

## 2022-08-30 NOTE — Telephone Encounter (Signed)
Needs to be seen here or at urgent care center.

## 2022-08-31 ENCOUNTER — Ambulatory Visit: Payer: Medicare Other | Admitting: Emergency Medicine

## 2022-08-31 ENCOUNTER — Encounter: Payer: Self-pay | Admitting: Emergency Medicine

## 2022-08-31 ENCOUNTER — Ambulatory Visit (INDEPENDENT_AMBULATORY_CARE_PROVIDER_SITE_OTHER): Payer: Medicare Other | Admitting: Emergency Medicine

## 2022-08-31 VITALS — BP 116/72 | HR 55 | Temp 98.0°F | Ht 67.0 in | Wt 171.0 lb

## 2022-08-31 DIAGNOSIS — I63512 Cerebral infarction due to unspecified occlusion or stenosis of left middle cerebral artery: Secondary | ICD-10-CM

## 2022-08-31 DIAGNOSIS — H6012 Cellulitis of left external ear: Secondary | ICD-10-CM | POA: Insufficient documentation

## 2022-08-31 MED ORDER — CEFADROXIL 500 MG PO CAPS: 500.0000 mg | ORAL_CAPSULE | Freq: Two times a day (BID) | ORAL | 0 refills | Status: AC

## 2022-08-31 MED ORDER — MUPIROCIN CALCIUM 2 % EX CREA: 1.0000 | TOPICAL_CREAM | Freq: Two times a day (BID) | CUTANEOUS | 0 refills | Status: DC

## 2022-08-31 NOTE — Assessment & Plan Note (Signed)
No signs of shingles. Mild cellulitis.  We will start Duricef 500 mg twice a day for 7 days along with Bactroban cream twice a day for 7 days. Advised to contact the office if no better or worse during the next several days.

## 2022-08-31 NOTE — Progress Notes (Signed)
Roberto Dickson 81 y.o.   No chief complaint on file.   HISTORY OF PRESENT ILLNESS: Acute problem visit today. This is a 81 y.o. male accompanied by daughter complaining of area of redness behind left ear for the past couple days.  She was concerned about shingles. No other associated symptoms. No other complaints or medical concerns today.  HPI   Prior to Admission medications   Medication Sig Start Date End Date Taking? Authorizing Provider  acetaminophen (TYLENOL) 325 MG tablet Take 1-2 tablets (325-650 mg total) by mouth every 4 (four) hours as needed for mild pain. 08/10/21  Yes Love, Ivan Anchors, PA-C  amLODipine (NORVASC) 5 MG tablet Take 1 tablet (5 mg total) by mouth daily. 02/27/22  Yes Shawnise Peterkin, Ines Bloomer, MD  ASPIRIN LOW DOSE 81 MG tablet TAKE 1 TABLET(81 MG) BY MOUTH DAILY. SWALLOW WHOLE Patient taking differently: Take 81 mg by mouth daily. 05/02/22  Yes Skeet Latch, MD  atorvastatin (LIPITOR) 80 MG tablet Take 1 tablet (80 mg total) by mouth daily. 02/09/22  Yes Noemy Hallmon, Ines Bloomer, MD  docusate sodium (COLACE) 100 MG capsule Take 1 capsule (100 mg total) by mouth 2 (two) times daily. Patient taking differently: Take 100 mg by mouth daily. 08/10/21  Yes Love, Ivan Anchors, PA-C  levETIRAcetam (KEPPRA) 500 MG tablet Take 1 tablet (500 mg total) by mouth 2 (two) times daily. 08/05/22  Yes Caryl Ada K, PA-C  losartan (COZAAR) 50 MG tablet Take 50 mg by mouth daily. 07/31/22  Yes [provider]  melatonin 3 MG TABS tablet Take 0.5 tablets (1.5 mg total) by mouth at bedtime. OVER THE COUNTER Patient taking differently: Take 1.5 mg by mouth at bedtime. 08/10/21  Yes Love, Ivan Anchors, PA-C  metoprolol succinate (TOPROL-XL) 50 MG 24 hr tablet Take 1 tablet (50 mg total) by mouth at bedtime. 02/09/22  Yes Horald Pollen, MD  pantoprazole (PROTONIX) 40 MG tablet TAKE 1 TABLET(40 MG) BY MOUTH AT BEDTIME 08/20/22  Yes Odyn Turko, Ines Bloomer, MD  tamsulosin (FLOMAX) 0.4 MG  CAPS capsule TAKE 1 CAPSULE(0.4 MG) BY MOUTH DAILY Patient taking differently: Take 0.4 mg by mouth daily. 07/18/22  Yes Amara Justen, Ines Bloomer, MD  tiZANidine (ZANAFLEX) 2 MG tablet Take 1 tablet (2 mg total) by mouth at bedtime as needed for muscle spasms. TAKE 1 TABLET(2 MG) BY MOUTH AT BEDTIME Strength: 2 mg 08/08/22  Yes Buena Boehm, Ines Bloomer, MD  warfarin (COUMADIN) 6 MG tablet Take 1 tablet by mouth daily or as directed by Anticoagulation Clinic. Patient taking differently: Take 6 mg by mouth See admin instructions. Take 1 tablet (6 mg) by mouth Sunday, Monday, Tuesday, Wednesday, Thursday and Saturday and then take one-half tablet (3 mg) on Friday or as directed by Anticoagulation Clinic. 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/12/2021   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   Sleep disturbance    Essential hypertension     Past Medical History:  Diagnosis Date   Hypertension     Past Surgical History:  Procedure Laterality Date   APPENDECTOMY     in his 55's  CORONARY ANGIOGRAPHY N/A 10/20/2021   Procedure: CORONARY ANGIOGRAPHY;  Surgeon: Kathleene Hazel, MD;  Location: [facility];  Service: Cardiovascular;  Laterality: N/A;   IR CT HEAD LTD  07/19/2021   IR FLUORO GUIDED NEEDLE PLC ASPIRATION/INJECTION LOC  07/21/2021   IR PERCUTANEOUS ART THROMBECTOMY/INFUSION INTRACRANIAL INC DIAG ANGIO  07/19/2021   RADIOLOGY WITH ANESTHESIA N/A 07/19/2021   Procedure: IR WITH ANESTHESIA;  Surgeon: Julieanne Cotton, MD;  Location: [facility] OR;  Service: Radiology;  Laterality: N/A;    Social  History   Socioeconomic History   Marital status: Married    Spouse name: Not on file   Number of children: Not on file   Years of education: Not on file   Highest education level: Not on file  Occupational History   Not on file  Tobacco Use   Smoking status: Never   Smokeless tobacco: Never  Vaping Use   Vaping Use: Never used  Substance and Sexual Activity   Alcohol use: Not Currently    Alcohol/week: 1.0 standard drink of alcohol    Types: 1 Cans of beer per week   Drug use: Never   Sexual activity: Not on file  Other Topics Concern   Not on file  Social History Narrative   Not on file   Social Determinants of Health   Financial Resource Strain: Not on file  Food Insecurity: Not on file  Transportation Needs: Not on file  Physical Activity: Not on file  Stress: Not on file  Social Connections: Not on file  Intimate Partner Violence: Not on file    Family History  Problem Relation Age of Onset   Cancer - Prostate Father      Review of Systems  Constitutional: Negative.  Negative for chills and fever.  Respiratory:  Negative for cough and shortness of breath.   Cardiovascular:  Negative for chest pain and palpitations.  Gastrointestinal:  Negative for abdominal pain, nausea and vomiting.  Genitourinary: Negative.   Skin:  Positive for rash.  Neurological:  Negative for dizziness and headaches.  All other systems reviewed and are negative.  Today's Vitals   08/31/22 1416  BP: 116/72  Pulse: (!) 55  Temp: 98 F (36.7 C)  TempSrc: Oral  SpO2: 99%  Weight: 171 lb (77.6 kg)  Height: 5\' 7"  (1.702 m)   Body mass index is 26.78 kg/m.  Physical Exam Vitals reviewed.  Constitutional:      Appearance: Normal appearance.  HENT:     Head: Normocephalic.     Ears:     Comments: Area behind left auricle is erythematous with some flaking of skin and irritation    Mouth/Throat:     Mouth: Mucous membranes are moist.     Pharynx: Oropharynx is clear.   Eyes:     Extraocular Movements: Extraocular movements intact.     Pupils: Pupils are equal, round, and reactive to light.  Cardiovascular:     Rate and Rhythm: Normal rate.  Pulmonary:     Effort: Pulmonary effort is normal.  Skin:    General: Skin is warm and dry.  Neurological:     General: No focal deficit present.     Mental Status: He is alert and oriented to person, place, and time.  Psychiatric:        Mood and Affect: Mood normal.        Behavior: Behavior normal.    ASSESSMENT & PLAN: Problem List Items Addressed This Visit  Nervous and Auditory   Cellulitis of left ear - Primary    No signs of shingles. Mild cellulitis.  We will start Duricef 500 mg twice a day for 7 days along with Bactroban cream twice a day for 7 days. Advised to contact the office if no better or worse during the next several days.      Relevant Medications   cefadroxil (DURICEF) 500 MG capsule   mupirocin cream (BACTROBAN) 2 %   Patient Instructions  Imptigo en los adultos Impetigo, Adult El imptigo es una infeccin de la piel. Si bien es ms frecuente en los nios pequeos, tambin puede [employer]. La infeccin causa lceras y ampollas que pican y producen un lquido Therapist, occupational. A medida que el lquido se seca, se forman costras gruesas de color miel. Por lo general, estos cambios en la piel aparecen en la cara, pero tambin pueden afectar otras reas del cuerpo. El imptigo habitualmente desaparece en 7 a 10 das con [place]. Cules son las causas? La causa de esta enfermedad son dos tipos de bacterias. Estas son los estafilococos y los estreptococos. Estas bacterias causan imptigo cuando se introducen debajo de la superficie de la piel. Es frecuente que esto suceda despus de que la piel se dae, por ejemplo: Cortes, raspones o rasguos. Erupciones cutneas. Picaduras de insectos, especialmente al rascarse la zona de la picadura. Varicela u otras  enfermedades que causan lceras abiertas en la piel. Lesiones por comerse o morderse las uas. El imptigo puede transmitirse fcilmente de Neomia Dear persona a otra (es contagioso). Puede trasmitirse a travs del contacto directo fsico o al Insurance risk surveyor, ropa u otros artculos que una persona que tenga la infeccin haya tocado. Al rascarse el rea afectada, el imptigo se puede propagar a otras partes del cuerpo. Las bacterias pueden introducirse debajo de las uas y propagarse cuando usted se toca otra zona de la piel. Qu incrementa el riesgo? Los siguientes factores pueden hacer que sea ms propenso a Aeronautical engineer afeccin: Microbiologist que impliquen el contacto con la piel de Economist. Tener la piel lastimada, por ejemplo, por un corte o un rasguo. Vivir en una zona con mucha humedad. Tener una higiene deficiente. Tener altos niveles de estafilococos en la Clinical cytogeneticist. Tener una afeccin que debilita la integridad de la piel, por ejemplo: Tener debilitado el sistema de defensa del cuerpo (sistema inmunitario). Presentar una afeccin en la piel que produzca lceras abiertas, como la varicela. Tener diabetes. Cules son los signos o sntomas? El sntoma principal de esta afeccin son pequeas ampollas, a menudo en la cara alrededor de la boca y la Clinical cytogeneticist. Con el tiempo, las ampollas se abren y se convierten en diminutas lceras (lesiones) con Wallace Keller. En algunos casos, las ampollas causan picazn o ardor. Rascarse, la irritacin o la falta de tratamiento pueden hacer que estas pequeas lesiones se agranden. Otros sntomas posibles incluyen los siguientes: Ampollas ms grandes. Pus. Ganglios linfticos hinchados. Cmo se diagnostica? Por lo general, esta afeccin se diagnostica durante un examen fsico. Puede tomarse una muestra de piel o de lquido de una ampolla para hacer anlisis de laboratorio con proliferacin de bacterias (prueba de cultivo). Los [employer] de  laboratorio pueden ser tiles para Pharmacist, hospital diagnstico o para ayudar a Water quality scientist. Cmo se trata? El tratamiento de esta afeccin depende de su gravedad: El imptigo leve puede tratarse con una crema con antibitico recetada. En los casos ms graves, puede usarse un antibitico oral. Laroy Apple  pueden usarse medicamentos para reducir Higher education careers adviser (antihistamnicos). Siga estas instrucciones en su casa: Medicamentos Use los medicamentos de venta libre y los recetados solamente como se lo haya indicado el mdico. Aplique o tome los antibiticos como se lo haya indicado el mdico. No deje de usar el antibitico aunque la afeccin mejore. Antes de aplicar un antibitico en crema o ungento, debe seguir estos pasos: Lave suavemente las reas infectadas con un jabn antibacteriano y agua tibia. [place] las reas con costras en agua tibia, enjabonada con un jabn antibacteriano. Frote con cuidado estas reas para eliminar las costras. No las frote vigorosamente. Prevencin de la transmisin de la infeccin  Para ayudar a evitar que el imptigo se extienda a Corporate treasurer del cuerpo: Tenga siempre las uas cortas y limpias. No se rasque las ampollas o lceras. Si es necesario, [place] las reas infectadas para Corporate treasurer. Lvese las manos frecuentemente con jabn y agua tibia. Para evitar contagiar el imptigo a otras personas: No comparta toallas. Lave la ropa y las sbanas en agua a una temperatura de 140 F (60 C) o mayor. Lanny Hurst en su casa hasta haber usado una crema con antibitico durante 48 horas (2 das) o tomado un antibitico oral durante 24 horas (1 da). Regrese al Aleen Campi y retome sus actividades con otras personas solamente si observa una mejora importante en la piel. Podr volver a Microbiologist de contacto despus de Chemical engineer antibiticos durante 72 horas (3 das). Indicaciones generales Cumpla con todas las visitas de seguimiento. Esto es  importante. Cmo se previene? Lvese las manos frecuentemente con jabn y agua tibia. No comparta toallas, paos, ropa, sbanas ni afeitadoras. Mantenga las uas cortas. Mantenga los cortes, las raspaduras, las picaduras de insectos o las erupciones limpios y cubiertos. Use repelente de insectos para evitar picaduras. Comunquese con un mdico si: Presenta ms ampollas o lceras, incluso con tratamiento. Otros miembros de la familia tienen lceras. Las lceras en la piel no mejoran despus de 72 horas (3 das) de [place]. Tiene fiebre. Solicite ayuda de inmediato si: Ve enrojecimiento que se extiende o hinchazn en la piel que rodea las lceras. Tiene dolor de Advertising copywriter. La zona en torno a la erupcin est caliente, roja o sensible al tacto. Su orina es oscura de Training and development officer rojizo. No orina con frecuencia u orina pequeas cantidades. Se encuentra muy cansado (aletargado). Tiene la cara, las manos o los pies hinchados. Resumen El imptigo es una infeccin de la piel que causa lceras y ampollas que pican y producen un lquido Therapist, occupational. A medida que el lquido se seca, se forma Deno Lunger. Los estafilococos y los estreptococos causan esta afeccin. Estas bacterias causan imptigo cuando se introducen debajo de la superficie de la piel, a travs de cortes, erupciones, picaduras de insectos o lceras abiertas. El tratamiento para esta afeccin puede incluir ungento antibitico o antibiticos orales. Para ayudar a evitar que el imptigo se propague a otras reas del cuerpo, asegrese de [employer] uas cortas, evite rascarse, [place] las ampollas y [employer] frecuentemente. Si tiene [employer], qudese en su casa hasta haber usado una crema con antibitico durante 48 horas (2 das) o tomado un antibitico oral durante 24 horas (1 da). Regrese al Aleen Campi y retome sus actividades con otras personas solamente si observa una mejora importante en la piel. Esta informacin no  tiene Theme park manager el consejo del mdico. Asegrese de hacerle al mdico cualquier pregunta que tenga. Document Revised: 06/22/2020 Document Reviewed: 06/22/2020 Elsevier Patient Education  [place], MD [facility]

## 2022-08-31 NOTE — Patient Instructions (Signed)
Imptigo en los adultos Impetigo, Adult El imptigo es una infeccin de la piel. Si bien es ms frecuente en los nios pequeos, tambin puede [employer] La infeccin causa lceras y ampollas que pican y producen un lquido Sales promotion account executive. A medida que el lquido se seca, se forman costras gruesas de color miel. Por lo general, estos cambios en la piel aparecen en la cara, pero tambin pueden afectar otras reas del cuerpo. El imptigo habitualmente desaparece en 7 a 10 das con tratamiento. Cules son las causas? La causa de esta enfermedad son dos tipos de bacterias. Estas son los estafilococos y los estreptococos. Estas bacterias causan imptigo cuando se introducen debajo de la superficie de la piel. Es frecuente que esto suceda despus de que la piel se dae, por ejemplo: Cortes, raspones o rasguos. Erupciones cutneas. Picaduras de insectos, especialmente al rascarse la zona de la picadura. Varicela u otras enfermedades que causan lceras abiertas en la piel. Lesiones por comerse o morderse las uas. El imptigo puede transmitirse fcilmente de Ardelia Mems persona a otra (es contagioso). Puede trasmitirse a travs del contacto directo fsico o al Academic librarian, ropa u otros artculos que una persona que tenga la infeccin haya tocado. Al rascarse el rea afectada, el imptigo se puede propagar a otras partes del cuerpo. Las bacterias pueden introducirse debajo de las uas y propagarse cuando usted se toca otra zona de la piel. Qu incrementa el riesgo? Los siguientes factores pueden hacer que sea ms propenso a Armed forces training and education officer afeccin: Careers information officer que impliquen el contacto con la piel de Producer, television/film/video. Tener la piel lastimada, por ejemplo, por un corte o un rasguo. Vivir en una zona con mucha humedad. Tener una higiene deficiente. Tener altos niveles de estafilococos en la Lawyer. Tener una afeccin que debilita la integridad de la piel, por ejemplo: Tener  debilitado el sistema de defensa del cuerpo (sistema inmunitario). Presentar una afeccin en la piel que produzca lceras abiertas, como la varicela. Tener diabetes. Cules son los signos o sntomas? El sntoma principal de esta afeccin son pequeas ampollas, a menudo en la cara alrededor de la boca y la Lawyer. Con el tiempo, las ampollas se abren y se convierten en diminutas lceras (lesiones) con Sherlon Handing. En algunos casos, las ampollas causan picazn o ardor. Rascarse, la irritacin o la falta de tratamiento pueden hacer que estas pequeas lesiones se agranden. Otros sntomas posibles incluyen los siguientes: Ampollas ms grandes. Pus. Ganglios linfticos hinchados. Cmo se diagnostica? Por lo general, esta afeccin se diagnostica durante un examen fsico. Puede tomarse una muestra de piel o de lquido de una ampolla para hacer anlisis de laboratorio con proliferacin de bacterias (prueba de cultivo). Los [employer] de laboratorio pueden ser tiles para Physicist, medical diagnstico o para ayudar a Naval architect. Cmo se trata? El tratamiento de esta afeccin depende de su gravedad: El imptigo leve puede tratarse con una crema con antibitico recetada. En los casos ms graves, puede usarse un antibitico oral. Tambin pueden usarse medicamentos para reducir Cabin crew (antihistamnicos). Siga estas instrucciones en su casa: Medicamentos Use los medicamentos de venta libre y los recetados solamente como se lo haya indicado el mdico. Aplique o tome los antibiticos como se lo haya indicado el mdico. No deje de usar el antibitico aunque la afeccin mejore. Antes de aplicar un antibitico en crema o ungento, debe seguir estos pasos: Lave suavemente las reas infectadas con un jabn antibacteriano y agua tibia. [place] reas con costras en  agua tibia, enjabonada con un jabn antibacteriano. Frote con cuidado estas reas para eliminar las costras. No las frote  vigorosamente. Prevencin de la transmisin de la infeccin  Para ayudar a evitar que el imptigo se extienda a Corporate treasurer del cuerpo: Tenga siempre las uas cortas y limpias. No se rasque las ampollas o lceras. Si es necesario, [place] las reas infectadas para Corporate treasurer. Lvese las manos frecuentemente con jabn y agua tibia. Para evitar contagiar el imptigo a otras personas: No comparta toallas. Lave la ropa y las sbanas en agua a una temperatura de 140 F (60 C) o mayor. Lanny Hurst en su casa hasta haber usado una crema con antibitico durante 48 horas (2 das) o tomado un antibitico oral durante 24 horas (1 da). Regrese al Aleen Campi y retome sus actividades con otras personas solamente si observa una mejora importante en la piel. Podr volver a Microbiologist de contacto despus de Chemical engineer antibiticos durante 72 horas (3 das). Indicaciones generales Cumpla con todas las visitas de seguimiento. Esto es importante. Cmo se previene? Lvese las manos frecuentemente con jabn y agua tibia. No comparta toallas, paos, ropa, sbanas ni afeitadoras. Mantenga las uas cortas. Mantenga los cortes, las raspaduras, las picaduras de insectos o las erupciones limpios y cubiertos. Use repelente de insectos para evitar picaduras. Comunquese con un mdico si: Presenta ms ampollas o lceras, incluso con tratamiento. Otros miembros de la familia tienen lceras. Las lceras en la piel no mejoran despus de 72 horas (3 das) de [place]. Tiene fiebre. Solicite ayuda de inmediato si: Ve enrojecimiento que se extiende o hinchazn en la piel que rodea las lceras. Tiene dolor de Advertising copywriter. La zona en torno a la erupcin est caliente, roja o sensible al tacto. Su orina es oscura de Training and development officer rojizo. No orina con frecuencia u orina pequeas cantidades. Se encuentra muy cansado (aletargado). Tiene la cara, las manos o los pies hinchados. Resumen El imptigo es una infeccin de  la piel que causa lceras y ampollas que pican y producen un lquido Therapist, occupational. A medida que el lquido se seca, se forma Deno Lunger. Los estafilococos y los estreptococos causan esta afeccin. Estas bacterias causan imptigo cuando se introducen debajo de la superficie de la piel, a travs de cortes, erupciones, picaduras de insectos o lceras abiertas. El tratamiento para esta afeccin puede incluir ungento antibitico o antibiticos orales. Para ayudar a evitar que el imptigo se propague a otras reas del cuerpo, asegrese de [employer] uas cortas, evite rascarse, [place] las ampollas y [employer] frecuentemente. Si tiene [employer], qudese en su casa hasta haber usado una crema con antibitico durante 48 horas (2 das) o tomado un antibitico oral durante 24 horas (1 da). Regrese al Aleen Campi y retome sus actividades con otras personas solamente si observa una mejora importante en la piel. Esta informacin no tiene Theme park manager el consejo del mdico. Asegrese de hacerle al mdico cualquier pregunta que tenga. Document Revised: 06/22/2020 Document Reviewed: 06/22/2020 Elsevier Patient Education  2023 [employer].

## 2022-09-19 ENCOUNTER — Other Ambulatory Visit: Payer: Self-pay | Admitting: Emergency Medicine

## 2022-09-19 ENCOUNTER — Other Ambulatory Visit: Payer: Self-pay | Admitting: Cardiology

## 2022-09-19 DIAGNOSIS — I63512 Cerebral infarction due to unspecified occlusion or stenosis of left middle cerebral artery: Secondary | ICD-10-CM

## 2022-09-19 DIAGNOSIS — Z5181 Encounter for therapeutic drug level monitoring: Secondary | ICD-10-CM

## 2022-09-19 DIAGNOSIS — I513 Intracardiac thrombosis, not elsewhere classified: Secondary | ICD-10-CM

## 2022-09-19 NOTE — Telephone Encounter (Signed)
Please review for refill. Thank you! 

## 2022-09-19 NOTE — Telephone Encounter (Signed)
Last INR 08/17/2022 Last OV5/26/2023

## 2022-10-05 ENCOUNTER — Ambulatory Visit: Payer: Medicare Other | Attending: Family

## 2022-10-05 DIAGNOSIS — I63512 Cerebral infarction due to unspecified occlusion or stenosis of left middle cerebral artery: Secondary | ICD-10-CM | POA: Diagnosis not present

## 2022-10-05 DIAGNOSIS — Z5181 Encounter for therapeutic drug level monitoring: Secondary | ICD-10-CM | POA: Diagnosis not present

## 2022-10-05 LAB — POCT INR: INR: 3.1 — AB (ref 2.0–3.0)

## 2022-10-05 NOTE — Patient Instructions (Signed)
Continue taking Warfarin 1 tablet daily except 1/2 tablet on Fridays. Recheck INR in 7 weeks. [facility]in [facility] [phone] or [phone];  EAT GRE[place] TONIGHT;

## 2022-10-18 ENCOUNTER — Other Ambulatory Visit: Payer: Self-pay | Admitting: Emergency Medicine

## 2022-10-18 MED ORDER — TAMSULOSIN HCL 0.4 MG PO CAPS: ORAL_CAPSULE | ORAL | 0 refills | Status: DC

## 2022-10-19 ENCOUNTER — Telehealth (HOSPITAL_BASED_OUTPATIENT_CLINIC_OR_DEPARTMENT_OTHER): Payer: Self-pay

## 2022-10-19 MED ORDER — ASPIRIN 81 MG PO TBEC: 81.0000 mg | DELAYED_RELEASE_TABLET | Freq: Every day | ORAL | 3 refills | Status: DC

## 2022-10-19 NOTE — Telephone Encounter (Signed)
Received fax from [facility] requesting refills for Aspirin 81 mg. Rx request sent to pharmacy.

## 2022-10-29 ENCOUNTER — Encounter: Payer: Self-pay | Admitting: Emergency Medicine

## 2022-10-30 ENCOUNTER — Other Ambulatory Visit: Payer: Self-pay | Admitting: Emergency Medicine

## 2022-10-30 ENCOUNTER — Ambulatory Visit (HOSPITAL_BASED_OUTPATIENT_CLINIC_OR_DEPARTMENT_OTHER): Payer: Medicare Other | Admitting: Family

## 2022-10-30 MED ORDER — LEVETIRACETAM 500 MG PO TABS: 500.0000 mg | ORAL_TABLET | Freq: Two times a day (BID) | ORAL | 2 refills | Status: DC

## 2022-10-30 NOTE — Telephone Encounter (Signed)
New prescription sent to pharmacy of record today.  Thanks.

## 2022-11-18 ENCOUNTER — Other Ambulatory Visit: Payer: Self-pay | Admitting: Emergency Medicine

## 2022-11-18 ENCOUNTER — Other Ambulatory Visit (HOSPITAL_BASED_OUTPATIENT_CLINIC_OR_DEPARTMENT_OTHER): Payer: Self-pay | Admitting: Family

## 2022-11-18 DIAGNOSIS — I513 Intracardiac thrombosis, not elsewhere classified: Secondary | ICD-10-CM

## 2022-11-18 DIAGNOSIS — I10 Essential (primary) hypertension: Secondary | ICD-10-CM

## 2022-11-18 DIAGNOSIS — R931 Abnormal findings on diagnostic imaging of heart and coronary circulation: Secondary | ICD-10-CM

## 2022-11-20 NOTE — Telephone Encounter (Signed)
Rx request sent to pharmacy.  

## 2022-11-23 ENCOUNTER — Ambulatory Visit: Payer: Medicare Other | Attending: Internal Medicine | Admitting: *Deleted

## 2022-11-23 ENCOUNTER — Ambulatory Visit (INDEPENDENT_AMBULATORY_CARE_PROVIDER_SITE_OTHER): Payer: Medicare Other

## 2022-11-23 VITALS — Ht 67.0 in | Wt 172.0 lb

## 2022-11-23 DIAGNOSIS — I63512 Cerebral infarction due to unspecified occlusion or stenosis of left middle cerebral artery: Secondary | ICD-10-CM | POA: Diagnosis not present

## 2022-11-23 DIAGNOSIS — Z5181 Encounter for therapeutic drug level monitoring: Secondary | ICD-10-CM

## 2022-11-23 DIAGNOSIS — I513 Intracardiac thrombosis, not elsewhere classified: Secondary | ICD-10-CM | POA: Diagnosis not present

## 2022-11-23 DIAGNOSIS — Z0000 Encounter for general adult medical examination without abnormal findings: Secondary | ICD-10-CM | POA: Diagnosis not present

## 2022-11-23 LAB — POCT INR: INR: 3.5 — AB (ref 2.0–3.0)

## 2022-11-23 NOTE — Patient Instructions (Signed)
Description   Do not take any warfarin today then start taking Warfarin 1 tablet daily except 1/2 tablet on Mondays and Fridays. Recheck INR in 4 weeks. [facility]in Clinic [phone] or [phone];

## 2022-11-23 NOTE — Progress Notes (Addendum)
Virtual Visit via Telephone Note  I connected with  Roberto Dickson on 11/23/22 at  2:00 PM EST by telephone and verified that I am speaking with the correct person using two identifiers.  Location: Patient: Home with daughter Provider: [facility] Persons participating in the virtual visit: patient/Nurse Health Advisor   I discussed the limitations, risks, security and privacy concerns of performing an evaluation and management service by telephone and the availability of in person appointments. The patient expressed understanding and agreed to proceed.  Interactive audio and video telecommunications were attempted between this nurse and patient, however failed, due to patient having technical difficulties OR patient did not have access to video capability.  We continued and completed visit with audio only.  Some vital signs may be absent or patient reported.   Mickeal Needy, LPN  Subjective:   Roberto Dickson is a 81 y.o. male who presents for an Initial Medicare Annual Wellness Visit.  Review of Systems     Cardiac Risk Factors include: advanced age (>88men, >9 women);dyslipidemia;hypertension;male gender     Objective:    Today's Vitals   11/23/22 1403 11/23/22 1420  Weight: 172 lb (78 kg)   Height:  (1.702 m)   PainSc: 0-No pain 3    Body mass index is 26.94 kg/m.     11/23/2022    2:08 PM 08/05/2022   10:43 AM 03/01/2022    7:28 PM 02/28/2022    3:41 PM 10/20/2021    6:28 AM 07/26/2021   10:42 PM  Advanced Directives  Does Patient Have a Medical Advance Directive? No No No No No No  Would patient like information on creating a medical advance directive? No - Patient declined No - Patient declined   No - Patient declined No - Patient declined    Current Medications (verified) Outpatient Encounter Medications as of 11/23/2022  Medication Sig   acetaminophen (TYLENOL) 325 MG tablet Take 1-2 tablets (325-650 mg total) by mouth every 4 (four)  hours as needed for mild pain.   amLODipine (NORVASC) 5 MG tablet Take 1 tablet (5 mg total) by mouth daily.   aspirin EC (ASPIRIN LOW DOSE) 81 MG tablet Take 1 tablet (81 mg total) by mouth daily. Swallow whole.   atorvastatin (LIPITOR) 80 MG tablet Take 1 tablet (80 mg total) by mouth daily.   docusate sodium (COLACE) 100 MG capsule Take 1 capsule (100 mg total) by mouth 2 (two) times daily. (Patient taking differently: Take 100 mg by mouth daily.)   levETIRAcetam (KEPPRA) 500 MG tablet Take 1 tablet (500 mg total) by mouth 2 (two) times daily.   losartan (COZAAR) 50 MG tablet Take 50 mg by mouth daily.   melatonin 3 MG TABS tablet Take 0.5 tablets (1.5 mg total) by mouth at bedtime. OVER THE COUNTER (Patient taking differently: Take 1.5 mg by mouth at bedtime.)   metoprolol succinate (TOPROL-XL) 50 MG 24 hr tablet TAKE 1 TABLET(50 MG) BY MOUTH AT BEDTIME   pantoprazole (PROTONIX) 40 MG tablet TAKE 1 TABLET(40 MG) BY MOUTH AT BEDTIME   tamsulosin (FLOMAX) 0.4 MG CAPS capsule Take one capsule by mouth daily.   tiZANidine (ZANAFLEX) 2 MG tablet TAKE 1 TABLET BY MOUTH AT BEDTIME AS NEEDED FOR MUSCLE SPASMS   warfarin (COUMADIN) 6 MG tablet TAKE 1 TABLET BY MOUTH DAILY AS DIRECTED BY COAGULATION CLINIC   [DISCONTINUED] mupirocin cream (BACTROBAN) 2 % Apply 1 Application topically 2 (two) times daily.   No facility-administered encounter medications  on file as of 11/23/2022.    Allergies (verified) Patient has no known allergies.   History: Past Medical History:  Diagnosis Date   Hypertension    Past Surgical History:  Procedure Laterality Date   APPENDECTOMY     in his 75's   CORONARY ANGIOGRAPHY N/A 10/20/2021   Procedure: CORONARY ANGIOGRAPHY;  Surgeon: Kathleene Hazel, MD;  Location: [facility];  Service: Cardiovascular;  Laterality: N/A;   IR CT HEAD LTD  07/19/2021   IR FLUORO GUIDED NEEDLE PLC ASPIRATION/INJECTION LOC  07/21/2021   IR PERCUTANEOUS ART  THROMBECTOMY/INFUSION INTRACRANIAL INC DIAG ANGIO  07/19/2021   RADIOLOGY WITH ANESTHESIA N/A 07/19/2021   Procedure: IR WITH ANESTHESIA;  Surgeon: Julieanne Cotton, MD;  Location: [facility] OR;  Service: Radiology;  Laterality: N/A;   Family History  Problem Relation Age of Onset   Cancer - Prostate Father    Social History   Socioeconomic History   Marital status: Married    Spouse name: Not on file   Number of children: Not on file   Years of education: Not on file   Highest education level: Not on file  Occupational History   Not on file  Tobacco Use   Smoking status: Never   Smokeless tobacco: Never  Vaping Use   Vaping Use: Never used  Substance and Sexual Activity   Alcohol use: Not Currently    Alcohol/week: 1.0 standard drink of alcohol    Types: 1 Cans of beer per week   Drug use: Never   Sexual activity: Not on file  Other Topics Concern   Not on file  Social History Narrative   Not on file   Social Determinants of Health   Financial Resource Strain: Low Risk  (11/23/2022)   Overall Financial Resource Strain (CARDIA)    Difficulty of Paying Living Expenses: Not hard at all  Food Insecurity: No Food Insecurity (11/23/2022)   Hunger Vital Sign    Worried About Running Out of Food in the Last Year: Never true    Ran Out of Food in the Last Year: Never true  Transportation Needs: No Transportation Needs (11/23/2022)   PRAPARE - Administrator, Civil Service (Medical): No    Lack of Transportation (Non-Medical): No  Physical Activity: Sufficiently Active (11/23/2022)   Exercise Vital Sign    Days of Exercise per Week: 5 days    Minutes of Exercise per Session: 30 min  Stress: No Stress Concern Present (11/23/2022)   [employer] of Occupational Health - Occupational Stress Questionnaire    Feeling of Stress : Not at all  Social Connections: Socially Integrated (11/23/2022)   Social Connection and Isolation Panel [NHANES]    Frequency of  Communication with Friends and Family: More than three times a week    Frequency of Social Gatherings with Friends and Family: More than three times a week    Attends Religious Services: More than 4 times per year    Active Member of [employer] or Organizations: Yes    Attends Engineer, structural: More than 4 times per year    Marital Status: Married    Tobacco Counseling Counseling given: Not Answered   Clinical Intake:  Pre-visit preparation completed: Yes  Pain : 0-10 Pain Score: 3  Pain Location: Neck     BMI - recorded: 26.94 Nutritional Status: BMI 25 -29 Overweight Nutritional Risks: None Diabetes: No  How often do you need to have someone help you when you  read instructions, pamphlets, or other written materials from your doctor or pharmacy?: 1 - Never What is the last grade level you completed in school?: HSG; some college  Diabetic? no  Interpreter Needed?: No  Information entered by :: Susie Cassette, LPN.   Activities of Daily Living    11/23/2022    2:12 PM  In your present state of health, do you have any difficulty performing the following activities:  Hearing? 1  Vision? 0  Difficulty concentrating or making decisions? 0  Walking or climbing stairs? 1  Dressing or bathing? 0  Doing errands, shopping? 0  Preparing Food and eating ? N  Using the Toilet? N  In the past six months, have you accidently leaked urine? N  Do you have problems with loss of bowel control? N  Managing your Medications? N  Managing your Finances? N  Housekeeping or managing your Housekeeping? N    Patient Care Team: [name], MD as PCP - General (Internal Medicine) Jodelle Red, MD as PCP - Cardiology (Cardiology)  Indicate any recent Medical Services you may have received from other than [facility] providers in the past year (date may be approximate).     Assessment:   This is a routine wellness examination for [place].  Hearing/Vision  screen Hearing Screening - Comments:: Has hearing difficulties; has hearing aids. Vision Screening - Comments:: Wears readers - not up to date with routine eye exams.  Dietary issues and exercise activities discussed: Current Exercise Habits: Home exercise routine, Type of exercise: walking, Time (Minutes): 30, Frequency (Times/Week): 5, Weekly Exercise (Minutes/Week): 150, Intensity: Mild, Exercise limited by: None identified;neurologic condition(s);orthopedic condition(s)   Goals Addressed             This Visit's Progress    Client understands the importance of follow-up with providers by attending scheduled visits        Depression Screen    11/23/2022    2:12 PM 08/24/2022    3:32 PM 11/22/2021    2:59 PM 11/09/2021    2:48 PM 08/25/2021    2:24 PM  PHQ 2/9 Scores  PHQ - 2 Score 0 0 0 0 0  PHQ- 9 Score     2    Fall Risk    11/23/2022    2:09 PM 08/24/2022    3:31 PM 05/23/2022    2:56 PM 11/22/2021    2:59 PM 11/09/2021    2:47 PM  Fall Risk   Falls in the past year? 0 0 0 0 0  Number falls in past yr: 0 0   0  Injury with Fall? 0 0   0  Risk for fall due to : No Fall Risks No Fall Risks   Other (Comment)  Risk for fall due to: Comment     Recent stroke  Follow up Falls prevention discussed Falls evaluation completed       FALL RISK PREVENTION PERTAINING TO THE HOME:  Any stairs in or around the home? No  If so, are there any without handrails? No  Home free of loose throw rugs in walkways, pet beds, electrical cords, etc? Yes  Adequate lighting in your home to reduce risk of falls? Yes   ASSISTIVE DEVICES UTILIZED TO PREVENT FALLS:  Life alert? No  Use of a cane, walker or w/c? No  Grab bars in the bathroom? Yes  Shower chair or bench in shower? No  Elevated toilet seat or a handicapped toilet? Yes   TIMED UP  AND DG:UYQIH visit  Was the test performed? No .  Cognitive Function:        11/23/2022    2:18 PM  6CIT Screen  What Year? 0 points   What month? 0 points  What time? 0 points  Count back from 20 0 points  Months in reverse 0 points  Repeat phrase 0 points  Total Score 0 points    Immunizations Immunization History  Administered Date(s) Administered   Moderna Sars-Covid-2 Vaccination 05/25/2020   PNEUMOCOCCAL CONJUGATE-20 04/20/2022   Tdap 04/20/2022   Zoster Recombinat (Shingrix) 09/02/2022    TDAP status: Up to date  Flu Vaccine status: Declined, Education has been provided regarding the importance of this vaccine but patient still declined. Advised may receive this vaccine at local pharmacy or Health Dept. Aware to provide a copy of the vaccination record if obtained from local pharmacy or Health Dept. Verbalized acceptance and understanding.  Pneumococcal vaccine status: Up to date  Covid-19 vaccine status: Completed vaccines  Qualifies for Shingles Vaccine? Yes   Zostavax completed No   Shingrix Completed?: No.    Education has been provided regarding the importance of this vaccine. Patient has been advised to call insurance company to determine out of pocket expense if they have not yet received this vaccine. Advised may also receive vaccine at local pharmacy or Health Dept. Verbalized acceptance and understanding.  Screening Tests Health Maintenance  Topic Date Due   Hepatitis C Screening  Never done   COVID-19 Vaccine (2 - 2023-24 season) 08/04/2022   Zoster Vaccines- Shingrix (2 of 2) 10/28/2022   INFLUENZA VACCINE  03/04/2023 (Originally 07/04/2022)   Medicare Annual Wellness (AWV)  11/24/2023   DTaP/Tdap/Td (2 - Td or Tdap) 04/20/2032   Pneumonia Vaccine 30+ Years old  Completed   HPV VACCINES  Aged Out    Health Maintenance  Health Maintenance Due  Topic Date Due   Hepatitis C Screening  Never done   COVID-19 Vaccine (2 - 2023-24 season) 08/04/2022   Zoster Vaccines- Shingrix (2 of 2) 10/28/2022    Colorectal cancer screening: No longer required.   Lung Cancer Screening: (Low Dose CT  Chest recommended if Age 2-80 years, 30 pack-year currently smoking OR have quit w/in 15years.) does not qualify.   Lung Cancer Screening Referral: no  Additional Screening:  Hepatitis C Screening: does qualify; Completed no  Vision Screening: Recommended annual ophthalmology exams for early detection of glaucoma and other disorders of the eye. Is the patient up to date with their annual eye exam?  No  Who is the provider or what is the name of the office in which the patient attends annual eye exams? refused If pt is not established with a provider, would they like to be referred to a provider to establish care? No .   Dental Screening: Recommended annual dental exams for proper oral hygiene  Community Resource Referral / Chronic Care Management: CRR required this visit?  No   CCM required this visit?  No      Plan:     I have personally reviewed and noted the following in the patient's chart:   Medical and social history Use of alcohol, tobacco or illicit drugs  Current medications and supplements including opioid prescriptions. Patient is not currently taking opioid prescriptions. Functional ability and status Nutritional status Physical activity Advanced directives List of other physicians Hospitalizations, surgeries, and ER visits in previous 12 months Vitals Screenings to include cognitive, depression, and falls Referrals and appointments  In addition, I have reviewed and discussed with patient certain preventive protocols, quality metrics, and best practice recommendations. A written personalized care plan for preventive services as well as general preventive health recommendations were provided to patient.     Mickeal NeedyShenika N Tayvia Faughnan, LPN   16/10/960412/21/2023   Nurse Notes: n/a  Medical screening examination/treatment/procedure(s) were performed by non-physician practitioner and as supervising physician I was immediately available for consultation/collaboration.  I agree  with above. Jacinta ShoeAleksei Plotnikov, MD

## 2022-11-23 NOTE — Patient Instructions (Addendum)
[name] , Thank you for taking time to come for your Medicare Wellness Visit. I appreciate your ongoing commitment to your health goals. Please review the following plan we discussed and let me know if I can assist you in the future.   These are the goals we discussed:  Goals      Client understands the importance of follow-up with providers by attending scheduled visits        This is a list of the screening recommended for you and due dates:  Health Maintenance  Topic Date Due   Hepatitis C Screening: USPSTF Recommendation to screen - Ages 9-79 yo.  Never done   COVID-19 Vaccine (2 - 2023-24 season) 08/04/2022   Zoster (Shingles) Vaccine (2 of 2) 10/28/2022   Flu Shot  03/04/2023*   Medicare Annual Wellness Visit  11/24/2023   DTaP/Tdap/Td vaccine (2 - Td or Tdap) 04/20/2032   Pneumonia Vaccine  Completed   HPV Vaccine  Aged Out  *Topic was postponed. The date shown is not the original due date.    Advanced directives: No  Conditions/risks identified: Yes  Next appointment: Follow up in one year for your annual wellness visit.   Preventive Care 79 Years and Older, Male  Preventive care refers to lifestyle choices and visits with your health care provider that can promote health and wellness. What does preventive care include? A yearly physical exam. This is also called an annual well check. Dental exams once or twice a year. Routine eye exams. Ask your health care provider how often you should have your eyes checked. Personal lifestyle choices, including: Daily care of your teeth and gums. Regular physical activity. Eating a healthy diet. Avoiding tobacco and drug use. Limiting alcohol use. Practicing safe sex. Taking low doses of aspirin every day. Taking vitamin and mineral supplements as recommended by your health care provider. What happens during an annual well check? The services and screenings done by your health care provider during your annual well  check will depend on your age, overall health, lifestyle risk factors, and family history of disease. Counseling  Your health care provider may ask you questions about your: Alcohol use. Tobacco use. Drug use. Emotional well-being. Home and relationship well-being. Sexual activity. Eating habits. History of falls. Memory and ability to understand (cognition). Work and work Astronomer. Screening  You may have the following tests or measurements: Height, weight, and BMI. Blood pressure. Lipid and cholesterol levels. These may be checked every 5 years, or more frequently if you are over 70 years old. Skin check. Lung cancer screening. You may have this screening every year starting at age 80 if you have a 30-pack-year history of smoking and currently smoke or have quit within the past 15 years. Fecal occult blood test (FOBT) of the stool. You may have this test every year starting at age 22. Flexible sigmoidoscopy or colonoscopy. You may have a sigmoidoscopy every 5 years or a colonoscopy every 10 years starting at age 101. Prostate cancer screening. Recommendations will vary depending on your family history and other risks. Hepatitis C blood test. Hepatitis B blood test. Sexually transmitted disease (STD) testing. Diabetes screening. This is done by checking your blood sugar (glucose) after you have not eaten for a while (fasting). You may have this done every 1-3 years. Abdominal aortic aneurysm (AAA) screening. You may need this if you are a current or former smoker. Osteoporosis. You may be screened starting at age 77 if you are at high risk.  Talk with your health care provider about your test results, treatment options, and if necessary, the need for more tests. Vaccines  Your health care provider may recommend certain vaccines, such as: Influenza vaccine. This is recommended every year. Tetanus, diphtheria, and acellular pertussis (Tdap, Td) vaccine. You may need a Td booster  every 10 years. Zoster vaccine. You may need this after age 45. Pneumococcal 13-valent conjugate (PCV13) vaccine. One dose is recommended after age 71. Pneumococcal polysaccharide (PPSV23) vaccine. One dose is recommended after age 23. Talk to your health care provider about which screenings and vaccines you need and how often you need them. This information is not intended to replace advice given to you by your health care provider. Make sure you discuss any questions you have with your health care provider. Document Released: 12/17/2015 Document Revised: 08/09/2016 Document Reviewed: 09/21/2015 Elsevier Interactive Patient Education  2017 [place] Prevention in the Home Falls can cause injuries. They can happen to people of all ages. There are many things you can do to make your home safe and to help prevent falls. What can I do on the outside of my home? Regularly fix the edges of walkways and driveways and fix any cracks. Remove anything that might make you trip as you walk through a door, such as a raised step or threshold. Trim any bushes or trees on the path to your home. Use bright outdoor lighting. Clear any walking paths of anything that might make someone trip, such as rocks or tools. Regularly check to see if handrails are loose or broken. Make sure that both sides of any steps have handrails. Any raised decks and porches should have guardrails on the edges. Have any leaves, snow, or ice cleared regularly. Use sand or salt on walking paths during winter. Clean up any spills in your garage right away. This includes oil or grease spills. What can I do in the bathroom? Use night lights. Install grab bars by the toilet and in the tub and shower. Do not use towel bars as grab bars. Use non-skid mats or decals in the tub or shower. If you need to sit down in the shower, use a plastic, non-slip stool. Keep the floor dry. Clean up any water that spills on the floor as soon  as it happens. Remove soap buildup in the tub or shower regularly. Attach bath mats securely with double-sided non-slip rug tape. Do not have throw rugs and other things on the floor that can make you trip. What can I do in the bedroom? Use night lights. Make sure that you have a light by your bed that is easy to reach. Do not use any sheets or blankets that are too big for your bed. They should not hang down onto the floor. Have a firm chair that has side arms. You can use this for support while you get dressed. Do not have throw rugs and other things on the floor that can make you trip. What can I do in the kitchen? Clean up any spills right away. Avoid walking on wet floors. Keep items that you use a lot in easy-to-reach places. If you need to reach something above you, use a strong step stool that has a grab bar. Keep electrical cords out of the way. Do not use floor polish or wax that makes floors slippery. If you must use wax, use non-skid floor wax. Do not have throw rugs and other things on the floor that can make  you trip. What can I do with my stairs? Do not leave any items on the stairs. Make sure that there are handrails on both sides of the stairs and use them. Fix handrails that are broken or loose. Make sure that handrails are as long as the stairways. Check any carpeting to make sure that it is firmly attached to the stairs. Fix any carpet that is loose or worn. Avoid having throw rugs at the top or bottom of the stairs. If you do have throw rugs, attach them to the floor with carpet tape. Make sure that you have a light switch at the top of the stairs and the bottom of the stairs. If you do not have them, ask someone to add them for you. What else can I do to help prevent falls? Wear shoes that: Do not have high heels. Have rubber bottoms. Are comfortable and fit you well. Are closed at the toe. Do not wear sandals. If you use a stepladder: Make sure that it is fully  opened. Do not climb a closed stepladder. Make sure that both sides of the stepladder are locked into place. Ask someone to hold it for you, if possible. Clearly mark and make sure that you can see: Any grab bars or handrails. First and last steps. Where the edge of each step is. Use tools that help you move around (mobility aids) if they are needed. These include: Canes. Walkers. Scooters. Crutches. Turn on the lights when you go into a dark area. Replace any light bulbs as soon as they burn out. Set up your furniture so you have a clear path. Avoid moving your furniture around. If any of your floors are uneven, fix them. If there are any pets around you, be aware of where they are. Review your medicines with your doctor. Some medicines can make you feel dizzy. This can increase your chance of falling. Ask your doctor what other things that you can do to help prevent falls. This information is not intended to replace advice given to you by your health care provider. Make sure you discuss any questions you have with your health care provider. Document Released: 09/16/2009 Document Revised: 04/27/2016 Document Reviewed: 12/25/2014 Elsevier Interactive Patient Education  2017 [employer].

## 2022-11-30 ENCOUNTER — Ambulatory Visit (INDEPENDENT_AMBULATORY_CARE_PROVIDER_SITE_OTHER): Payer: Medicare Other | Admitting: Family

## 2022-11-30 ENCOUNTER — Encounter (HOSPITAL_BASED_OUTPATIENT_CLINIC_OR_DEPARTMENT_OTHER): Payer: Self-pay

## 2022-11-30 ENCOUNTER — Encounter (HOSPITAL_BASED_OUTPATIENT_CLINIC_OR_DEPARTMENT_OTHER): Payer: Self-pay | Admitting: Family

## 2022-11-30 VITALS — BP 116/74 | HR 65 | Ht 67.0 in | Wt 174.0 lb

## 2022-11-30 DIAGNOSIS — E785 Hyperlipidemia, unspecified: Secondary | ICD-10-CM

## 2022-11-30 DIAGNOSIS — I513 Intracardiac thrombosis, not elsewhere classified: Secondary | ICD-10-CM | POA: Diagnosis not present

## 2022-11-30 DIAGNOSIS — Z8673 Personal history of transient ischemic attack (TIA), and cerebral infarction without residual deficits: Secondary | ICD-10-CM

## 2022-11-30 DIAGNOSIS — I5022 Chronic systolic (congestive) heart failure: Secondary | ICD-10-CM | POA: Diagnosis not present

## 2022-11-30 DIAGNOSIS — D6859 Other primary thrombophilia: Secondary | ICD-10-CM | POA: Diagnosis not present

## 2022-11-30 DIAGNOSIS — I25118 Atherosclerotic heart disease of native coronary artery with other forms of angina pectoris: Secondary | ICD-10-CM

## 2022-11-30 NOTE — Patient Instructions (Addendum)
Medication Instructions:  Continue your current medications.   If you are not taking Losartan, send [place] a MyChart to let us know.   *If you need a refill on your cardiac medications before your next appointment, please call your pharmacy*  Follow-Up: At [facility], you and your health needs are our priority.  As part of our continuing mission to provide you with exceptional heart care, we have created designated Provider Care Teams.  These Care Teams include your primary Cardiologist (physician) and Advanced Practice Providers (APPs -  Physician Assistants and Nurse Practitioners) who all work together to provide you with the care you need, when you need it.  We recommend signing up for the patient portal called "MyChart".  Sign up information is provided on this After Visit Summary.  MyChart is used to connect with patients for Virtual Visits (Telemedicine).  Patients are able to view lab/test results, encounter notes, upcoming appointments, etc.  Non-urgent messages can be sent to your provider as well.   To learn more about what you can do with MyChart, go to [email or web address].    Your next appointment:   6 month(s)  The format for your next appointment:   In Person  Provider:   Jodelle Red, MD or Gillian Shields, NP    Other Instructions  If you notice persistent fatigue and want to try lower dose of Metoprolol simply call or send [place] a MyChart message.

## 2022-11-30 NOTE — Progress Notes (Signed)
Office Visit    Patient Name: Roberto Dickson Date of Encounter: 11/30/2022  PCP:  Horald Pollen, [place]  Cardiologist:  Buford Dresser, MD  Advanced Practice Provider:  No care team member to display Electrophysiologist:  None     Chief Complaint    [name] is a 81 y.o. male with a hx of HFrEF, CVA, HTN, HLD, LV thrombus, CAD presents today for follow-up of heart failure.  Past Medical History    Past Medical History:  Diagnosis Date   Hypertension    Past Surgical History:  Procedure Laterality Date   APPENDECTOMY     in his 8's   CORONARY ANGIOGRAPHY N/A 10/20/2021   Procedure: CORONARY ANGIOGRAPHY;  Surgeon: Burnell Blanks, MD;  Location: [facility];  Service: Cardiovascular;  Laterality: N/A;   IR CT HEAD LTD  07/19/2021   IR FLUORO GUIDED NEEDLE PLC ASPIRATION/INJECTION LOC  07/21/2021   IR PERCUTANEOUS ART THROMBECTOMY/INFUSION INTRACRANIAL INC DIAG ANGIO  07/19/2021   RADIOLOGY WITH ANESTHESIA N/A 07/19/2021   Procedure: IR WITH ANESTHESIA;  Surgeon: Luanne Bras, MD;  Location: [facility];  Service: Radiology;  Laterality: N/A;    Allergies  No Known Allergies  History of Present Illness    Roberto Dickson is a 81 y.o. male with a hx of CVA, HTN, HLD, LV thrombus, HFrEF, CAD last seen 01/2022  He was admitted 07/26/2021 - 08/10/2021 with acute ischemic left middle cerebral artery (MCA) stroke.  He received tPA and additionally underwent cerebral angiography with revascularization of left ICA from proximal to distal terminus and origin of left MCA.  Echocardiogram with LVEF 50 to 55% with apical thrombus and Coumadin started.  There were wall motion abnormalities noted by echocardiogram but this was recommended for outpatient duration.[name]  He was recommended for CIR.  Seen in follow up 08/31/21, stress test was recommended due to wall motion abnormalities by echo. Stress test 09/06/21  with large size, severe severity mid to apical inferior mostly fixed perfusion defect suggestive of large RCA territory scar with peri-infarct ischemia. LVEF read as 29% but suspected to be falsely low.  Cardiac CT 09/2021 calcium score 6053 and significant three vessel disease with likely CTO of LAD. Subsequent cardiac cath 10/20/21 with severe triple vessel CAD (CTO mid LAD, distal LAD diffusely diseased filing with L-L collaterals, moderate caliber diagonal branch severe disease, severe disease Cx and first obtuse marginal, CTO dominant RCA with L-R collaterals). PCI not an option. He met with [name] of [facility] and decided to proceed with medical management. Repeat echocardiogram 01/13/22 with LVEF 45-50%, RWMA, mild LVH, gr1DD, dilation aortic root 46m and ascending aorta 439m There was swirling artifact at apex suggesting slow flow but previously LV thrombus resolved. Given akinetic apex, recommended to continue anticoagulation.   At follow up 01/2022 and 04/2022 he was doing well from a cardiac perspective. Losartan was trialed for optimization of GDMT but was stopped due to rash which ended up being shingles. However, as BP well controlled on Amlodipine and asymptomatic in regards to his heart failure it was not resumed.   He presents today for follow up with daughter and interpretor. Feeling well. Reports no shortness of breath nor dyspnea on exertion. Reports no chest pain, pressure, or tightness. No edema, orthopnea, PND. Reports no palpitations. Notes some sleepiness and often takes a nap in the evening but is active during the day walking and painting. Tells me his  left hip or knee hurts on occasion with walking which improves with as needed tylenol. No symptoms concerning for claudication.    EKGs/Labs/Other Studies Reviewed:   The following studies were reviewed today:  Echo 01/2022  1. Left ventricular ejection fraction, by estimation, is 45 to 50%. The  left ventricle has mildly  decreased function. The left ventricle  demonstrates regional wall motion abnormalities (see scoring  diagram/findings for description). There is mild left  ventricular hypertrophy. Left ventricular diastolic parameters are  consistent with Grade I diastolic dysfunction (impaired relaxation).   2. Right ventricular systolic function is mildly reduced. The right  ventricular size is normal.   3. The mitral valve is normal in structure. Trivial mitral valve  regurgitation. No evidence of mitral stenosis.   4. The aortic valve is tricuspid. Aortic valve regurgitation is mild to  moderate. Aortic valve sclerosis/calcification is present, without any  evidence of aortic stenosis.   5. Aortic dilatation noted. There is dilatation of the aortic root,  measuring 41 mm. There is dilatation of the ascending aorta, measuring 43  mm.   Comparison(s): Compared to prior, there is swirling artifact at apex on  contrast images suggesting slow flow but previously seen LV thrombus is  not present. Apex remains akinetic however, would continue anticoagulation  if tolerating.   Coronary angiography 10/20/21   Mid RCA lesion is 100% stenosed.   Mid Cx to Dist Cx lesion is 90% stenosed.   1st Mrg lesion is 90% stenosed.   Prox Cx to Mid Cx lesion is 80% stenosed.   Mid LAD to Dist LAD lesion is 100% stenosed.   2nd Diag lesion is 90% stenosed.   Ost LAD to Mid LAD lesion is 30% stenosed.   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 The distal LAD is diffusely diseased and fills from left to left collaterals. The moderate caliber diagonal branch has severe disease. Severe disease in the mid and distal AV groove Circumflex. Severe disease in the first obtuse marginal branch Chronic total occlusion of the dominant RCA in the mid segment. The distal vessel fills from left to right collaterals.    Recommendations: He has an LV thrombus by recent echo and has been on coumadin. He has  been bridged with Lovenox for this procedure today. I did not cross into the LV today due to the LV thrombus. He will be admitted post cath and we will start IV heparin 8 hours post sheath pull. I would anticipate that he will be watched overnight and then Lovenox bridging resumed tomorrow if no access site bleeding. Resume coumadin tomorrow. In regards to his CAD, he has severe three vessel CAD with CTO of the RCA and LAD. PCI is not an option. His options include medical management vs CABG. Given advanced age, recent stroke and LV thrombus, he may not be a good candidate for CABG. This would be delayed for several months anyway given LV thrombus and recent stroke. We can plan an outpatient CT surgery consult in several weeks post discharge. Fortunately he has overall preserved LV systolic function and is asymptomatic.    Cardiac CT 09/29/21 IMPRESSION: 1. Coronary calcium score of 6053. This was 99th percentile for age-, sex, and race-matched controls.   2. Normal coronary origin with right dominance.   3. 100% occlusion of the mid LAD that likely a chronic total occlusion with collateral flow filling the vessel distally.   4. Moderate (50-69%) mixed density plaque in the  mid LCX.   5. Severe stenosis in the proximal RCA (70-99%) with 100% occlusion versus subtotal occlusion of the mid RCA.   6. The apical myocardium is thinned and aneurysmal consistent with prior LAD infarction.   7. There is a filling defect in the apex (measures 26 mm x 26 mm) consistent with LV thrombus.   RECOMMENDATIONS: 1. Cardiac catheterization is recommended due to concerns for 3-vessel CAD.   2. LV thrombus is known from prior imaging.   Myoview 09/07/21   Findings are consistent with prior myocardial infarction with peri-infarct ischemia. The study is high risk.   No ST deviation was noted.   LV perfusion is abnormal. There is no evidence of ischemia. There is evidence of infarction. Defect 1: There is a  large defect with severe reduction in uptake present in the apical to mid inferior location(s) that is partially reversible. Viability is present. There is abnormal wall motion in the defect area. Consistent with infarction and peri-infarct ischemia.   Left ventricular function is abnormal. Global function is severely reduced. There was a single regional abnormality. Nuclear stress EF: 29 %. The left ventricular ejection fraction is severely decreased (<30%). End diastolic cavity size is mildly enlarged. End systolic cavity size is mildly enlarged.   Prior study not available for comparison.   Large size, severe severity mid to apical inferior mostly fixed perfusion defect, suggestive of large RCA territory scar with peri-infarct ischemia (SDS 5). LVEF 29% with inferior and apical akinesis (suspect LVEF is falsely low, recommend echo correlation). This is a high risk study. No prior for comparison.  Echo 07/20/21  1. 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 Apical, apical anterior,  and apical septal akinesis. Inferoseptal hypokinesis. Left ventricular  ejection fraction, by estimation, is 50 to 55%. The left ventricle has low  normal function. The left  ventricle demonstrates regional wall motion abnormalities (see scoring  diagram/findings for description). Left ventricular diastolic parameters  are consistent with Grade I diastolic dysfunction (impaired relaxation).   2. Right ventricular systolic function is normal. The right ventricular  size is normal.   3. The mitral valve is normal in structure. Trivial mitral valve  regurgitation. No evidence of mitral stenosis.   4. The aortic valve is normal in structure. Aortic valve regurgitation is  not visualized. No aortic stenosis is present.   5. The inferior vena cava is normal in size with greater than 50%  respiratory variability, suggesting right atrial pressure of 3 mmHg.   EKG:No EKG today.   Recent Labs: 08/05/2022: ALT 30; BUN 31;  Creatinine, Ser 1.75; Hemoglobin 12.7; Platelets 241; Potassium 4.0; Sodium 142  Recent Lipid Panel    Component Value Date/Time   CHOL 128 10/13/2021 0000   TRIG 134 10/13/2021 0000   HDL 30 (L) 10/13/2021 0000   CHOLHDL 4.3 10/13/2021 0000   CHOLHDL 6.0 07/20/2021 0536   VLDL 17 07/20/2021 0536   LDLCALC 74 10/13/2021 0000   Home Medications   Current Meds  Medication Sig   acetaminophen (TYLENOL) 325 MG tablet Take 1-2 tablets (325-650 mg total) by mouth every 4 (four) hours as needed for mild pain.   amLODipine (NORVASC) 5 MG tablet Take 1 tablet (5 mg total) by mouth daily.   aspirin EC (ASPIRIN LOW DOSE) 81 MG tablet Take 1 tablet (81 mg total) by mouth daily. Swallow whole.   atorvastatin (LIPITOR) 80 MG tablet Take 1 tablet (80 mg total) by mouth daily.   docusate sodium (COLACE)  100 MG capsule Take 1 capsule (100 mg total) by mouth 2 (two) times daily. (Patient taking differently: Take 100 mg by mouth daily.)   levETIRAcetam (KEPPRA) 500 MG tablet Take 1 tablet (500 mg total) by mouth 2 (two) times daily.   melatonin 3 MG TABS tablet Take 0.5 tablets (1.5 mg total) by mouth at bedtime. OVER THE COUNTER (Patient taking differently: Take 1.5 mg by mouth at bedtime.)   metoprolol succinate (TOPROL-XL) 50 MG 24 hr tablet TAKE 1 TABLET(50 MG) BY MOUTH AT BEDTIME   pantoprazole (PROTONIX) 40 MG tablet TAKE 1 TABLET(40 MG) BY MOUTH AT BEDTIME   tamsulosin (FLOMAX) 0.4 MG CAPS capsule Take one capsule by mouth daily.   tiZANidine (ZANAFLEX) 2 MG tablet TAKE 1 TABLET BY MOUTH AT BEDTIME AS NEEDED FOR MUSCLE SPASMS   warfarin (COUMADIN) 6 MG tablet TAKE 1 TABLET BY MOUTH DAILY AS DIRECTED BY COAGULATION CLINIC   [DISCONTINUED] losartan (COZAAR) 50 MG tablet Take 50 mg by mouth daily.     Review of Systems    All other systems reviewed and are otherwise negative except as noted above.  Physical Exam    VS:  BP 116/74   Pulse 65   Ht _0  (1.702 m)   Wt 174 lb (78.9 kg)   SpO2  96%   BMI 27.25 kg/m  , BMI Body mass index is 27.25 kg/m.  Wt Readings from Last 3 Encounters:  11/30/22 174 lb (78.9 kg)  11/23/22 172 lb (78 kg)  08/31/22 171 lb (77.6 kg)    GEN: Well nourished, well developed, in no acute distress. HEENT: normal. Neck: Supple, no JVD, carotid bruits, or masses. Cardiac: RRR, no murmurs, rubs, or gallops. No clubbing, cyanosis, edema.  Radials/PT 2+ and equal bilaterally.  Respiratory:  Respirations regular and unlabored, clear to auscultation bilaterally. GI: Soft, nontender, nondistended. MS: No deformity or atrophy. Skin: Warm and dry, no rash. R radial catheterization site with no ecchymosis nor signs of infection.  Neuro:  Strength and sensation are intact. Psych: Normal affect.  Assessment & Plan   Medication management - Losartan on med list but per my last note he was not taking. Daughter will check his medications at home. I anticipate it was stopped when he developed his rash which was later found to be shingles. However, given low normal blood pressure if not taking Losartan would not resume at this time.   CAD - Echo 10/2021 with significant 3 vessel CAD not PCI candidate. LVEF preserved. Seen by [facility] and prefers medial management.He is asymptomatic with no chest pain nor dyspnea. His GDMT includes aspirin, metoprolol, atorvastatin. If anginal symptoms develop, could consider Imdur or alternative antianginal agent. Heart healthy diet and regular cardiovascular exercise encouraged.    HFrEF - Echo 01/2022 mildly reduced LVEF 45-50%. Plan for repeat echo 01/2023. Recommend fluid restriction <2L, low salt diet. Continue beta blocker. BP will not tolerate ARB/ARNI. No indication for loop diuretic.  NYHA I.  History of CVA-Continue to follow with neurology.  Continue atorvastatin, aspirin. Mild R hand and RLE defecits. Has been painting to work on deficits.   LV thrombus noted 07/2021 with anticoagulation-tolerating Coumadin without bleeding  complications. Repeat echo 01/2022 with resolution of thrombus but still akinetic apex with slow flow recommended for continuation of Warfarin. Denies hematuria, melena. Follows routinely with coumadin clinic.   HTN- BP low end of normal with no lightheadedness nor dizziness.  Continue Amlodipine.   HLD, LDL goal less than 70-Continue Atorvastatin 81m  daily.   Disposition: Follow up in in in 6 months with Dr. Harrell Gave or APP.  Signed, Loel Dubonnet, NP 11/30/2022, 8:20 PM [facility]

## 2022-12-01 NOTE — Telephone Encounter (Signed)
Just to confirm we need to restart losartan? Which dose?

## 2022-12-21 ENCOUNTER — Ambulatory Visit: Payer: Medicare Other | Attending: Family

## 2022-12-21 DIAGNOSIS — Z5181 Encounter for therapeutic drug level monitoring: Secondary | ICD-10-CM

## 2022-12-21 DIAGNOSIS — I63512 Cerebral infarction due to unspecified occlusion or stenosis of left middle cerebral artery: Secondary | ICD-10-CM | POA: Diagnosis not present

## 2022-12-21 LAB — POCT INR: INR: 2.3 (ref 2.0–3.0)

## 2022-12-21 NOTE — Patient Instructions (Signed)
Description   Continue taking Warfarin 1 tablet daily except 1/2 tablet on Mondays and Fridays. Recheck INR in 5 weeks.  [facility] [phone]

## 2023-01-04 ENCOUNTER — Ambulatory Visit (INDEPENDENT_AMBULATORY_CARE_PROVIDER_SITE_OTHER): Payer: Medicare Other | Admitting: Neurology

## 2023-01-04 DIAGNOSIS — R4182 Altered mental status, unspecified: Secondary | ICD-10-CM | POA: Diagnosis not present

## 2023-01-04 DIAGNOSIS — R404 Transient alteration of awareness: Secondary | ICD-10-CM

## 2023-01-04 DIAGNOSIS — I63312 Cerebral infarction due to thrombosis of left middle cerebral artery: Secondary | ICD-10-CM

## 2023-01-05 ENCOUNTER — Telehealth (HOSPITAL_BASED_OUTPATIENT_CLINIC_OR_DEPARTMENT_OTHER): Payer: Self-pay | Admitting: Family

## 2023-01-05 NOTE — Telephone Encounter (Signed)
Called to discuss scheduling the Echocardiogram ordered by Laurann Montana, NP---call could not be completed

## 2023-01-09 ENCOUNTER — Encounter (HOSPITAL_COMMUNITY): Payer: Self-pay | Admitting: Internal Medicine

## 2023-01-09 ENCOUNTER — Other Ambulatory Visit: Payer: Self-pay

## 2023-01-09 ENCOUNTER — Inpatient Hospital Stay (HOSPITAL_COMMUNITY)
Admission: EM | Admit: 2023-01-09 | Discharge: 2023-01-15 | DRG: 177 | Disposition: A | Discharge status: Home Health | Payer: Medicare Other | Attending: Internal Medicine | Admitting: Internal Medicine

## 2023-01-09 ENCOUNTER — Emergency Department (HOSPITAL_COMMUNITY): Payer: Medicare Other

## 2023-01-09 DIAGNOSIS — Z79890 Hormone replacement therapy (postmenopausal): Secondary | ICD-10-CM

## 2023-01-09 DIAGNOSIS — I351 Nonrheumatic aortic (valve) insufficiency: Secondary | ICD-10-CM | POA: Diagnosis present

## 2023-01-09 DIAGNOSIS — Z8673 Personal history of transient ischemic attack (TIA), and cerebral infarction without residual deficits: Secondary | ICD-10-CM

## 2023-01-09 DIAGNOSIS — R7989 Other specified abnormal findings of blood chemistry: Secondary | ICD-10-CM | POA: Diagnosis not present

## 2023-01-09 DIAGNOSIS — B957 Other staphylococcus as the cause of diseases classified elsewhere: Secondary | ICD-10-CM | POA: Diagnosis present

## 2023-01-09 DIAGNOSIS — I77810 Thoracic aortic ectasia: Secondary | ICD-10-CM | POA: Diagnosis present

## 2023-01-09 DIAGNOSIS — I513 Intracardiac thrombosis, not elsewhere classified: Secondary | ICD-10-CM

## 2023-01-09 DIAGNOSIS — R54 Age-related physical debility: Secondary | ICD-10-CM | POA: Diagnosis present

## 2023-01-09 DIAGNOSIS — I10 Essential (primary) hypertension: Secondary | ICD-10-CM | POA: Diagnosis present

## 2023-01-09 DIAGNOSIS — I2510 Atherosclerotic heart disease of native coronary artery without angina pectoris: Secondary | ICD-10-CM | POA: Diagnosis present

## 2023-01-09 DIAGNOSIS — Z79899 Other long term (current) drug therapy: Secondary | ICD-10-CM | POA: Diagnosis not present

## 2023-01-09 DIAGNOSIS — I25118 Atherosclerotic heart disease of native coronary artery with other forms of angina pectoris: Secondary | ICD-10-CM | POA: Diagnosis not present

## 2023-01-09 DIAGNOSIS — Z87898 Personal history of other specified conditions: Secondary | ICD-10-CM | POA: Diagnosis not present

## 2023-01-09 DIAGNOSIS — Z7901 Long term (current) use of anticoagulants: Secondary | ICD-10-CM

## 2023-01-09 DIAGNOSIS — J9601 Acute respiratory failure with hypoxia: Secondary | ICD-10-CM | POA: Diagnosis not present

## 2023-01-09 DIAGNOSIS — I255 Ischemic cardiomyopathy: Secondary | ICD-10-CM | POA: Diagnosis not present

## 2023-01-09 DIAGNOSIS — I5022 Chronic systolic (congestive) heart failure: Secondary | ICD-10-CM | POA: Diagnosis present

## 2023-01-09 DIAGNOSIS — Z7982 Long term (current) use of aspirin: Secondary | ICD-10-CM | POA: Diagnosis not present

## 2023-01-09 DIAGNOSIS — I493 Ventricular premature depolarization: Secondary | ICD-10-CM | POA: Diagnosis present

## 2023-01-09 DIAGNOSIS — I2489 Other forms of acute ischemic heart disease: Secondary | ICD-10-CM | POA: Diagnosis present

## 2023-01-09 DIAGNOSIS — R569 Unspecified convulsions: Secondary | ICD-10-CM | POA: Diagnosis present

## 2023-01-09 DIAGNOSIS — I2583 Coronary atherosclerosis due to lipid rich plaque: Secondary | ICD-10-CM | POA: Diagnosis not present

## 2023-01-09 DIAGNOSIS — J189 Pneumonia, unspecified organism: Secondary | ICD-10-CM

## 2023-01-09 DIAGNOSIS — U071 COVID-19: Secondary | ICD-10-CM | POA: Diagnosis not present

## 2023-01-09 DIAGNOSIS — E7800 Pure hypercholesterolemia, unspecified: Secondary | ICD-10-CM | POA: Diagnosis not present

## 2023-01-09 DIAGNOSIS — I63512 Cerebral infarction due to unspecified occlusion or stenosis of left middle cerebral artery: Secondary | ICD-10-CM

## 2023-01-09 DIAGNOSIS — Z5181 Encounter for therapeutic drug level monitoring: Secondary | ICD-10-CM

## 2023-01-09 DIAGNOSIS — E785 Hyperlipidemia, unspecified: Secondary | ICD-10-CM | POA: Diagnosis present

## 2023-01-09 DIAGNOSIS — I110 Hypertensive heart disease with heart failure: Secondary | ICD-10-CM | POA: Diagnosis present

## 2023-01-09 DIAGNOSIS — J1282 Pneumonia due to coronavirus disease 2019: Secondary | ICD-10-CM | POA: Diagnosis present

## 2023-01-09 LAB — C-REACTIVE PROTEIN: CRP: 7.9 mg/dL — ABNORMAL HIGH (ref <1.0)

## 2023-01-09 LAB — COMPREHENSIVE METABOLIC PANEL
ALT: 34 U/L (ref 0–44)
AST: 34 U/L (ref 15–41)
Albumin: 3.7 g/dL (ref 3.5–5.0)
Alkaline Phosphatase: 63 U/L (ref 38–126)
Anion gap: 11 (ref 5–15)
BUN: 25 mg/dL — ABNORMAL HIGH (ref 8–23)
CO2: 17 mmol/L — ABNORMAL LOW (ref 22–32)
Calcium: 8.1 mg/dL — ABNORMAL LOW (ref 8.9–10.3)
Chloride: 111 mmol/L (ref 98–111)
Creatinine, Ser: 1.59 mg/dL — ABNORMAL HIGH (ref 0.61–1.24)
GFR, Estimated: 44 mL/min — ABNORMAL LOW (ref >60)
Glucose, Bld: 115 mg/dL — ABNORMAL HIGH (ref 70–99)
Potassium: 3.6 mmol/L (ref 3.5–5.1)
Sodium: 139 mmol/L (ref 135–145)
Total Bilirubin: 1 mg/dL (ref 0.3–1.2)
Total Protein: 6.9 g/dL (ref 6.5–8.1)

## 2023-01-09 LAB — RESP PANEL BY RT-PCR (RSV, FLU A&B, COVID)  RVPGX2
Influenza A by PCR: NEGATIVE
Influenza B by PCR: NEGATIVE
Resp Syncytial Virus by PCR: NEGATIVE
SARS Coronavirus 2 by RT PCR: POSITIVE — AB

## 2023-01-09 LAB — CBC WITH DIFFERENTIAL/PLATELET
Abs Immature Granulocytes: 0.06 10*3/uL (ref 0.00–0.07)
Basophils Absolute: 0 10*3/uL (ref 0.0–0.1)
Basophils Relative: 0 %
Eosinophils Absolute: 0 10*3/uL (ref 0.0–0.5)
Eosinophils Relative: 0 %
HCT: 39.5 % (ref 39.0–52.0)
Hemoglobin: 13 g/dL (ref 13.0–17.0)
Immature Granulocytes: 1 %
Lymphocytes Relative: 4 %
Lymphs Abs: 0.5 10*3/uL — ABNORMAL LOW (ref 0.7–4.0)
MCH: 31.5 pg (ref 26.0–34.0)
MCHC: 32.9 g/dL (ref 30.0–36.0)
MCV: 95.6 fL (ref 80.0–100.0)
Monocytes Absolute: 2.3 10*3/uL — ABNORMAL HIGH (ref 0.1–1.0)
Monocytes Relative: 18 %
Neutro Abs: 9.5 10*3/uL — ABNORMAL HIGH (ref 1.7–7.7)
Neutrophils Relative %: 77 %
Platelets: 159 10*3/uL (ref 150–400)
RBC: 4.13 MIL/uL — ABNORMAL LOW (ref 4.22–5.81)
RDW: 14.5 % (ref 11.5–15.5)
WBC: 12.4 10*3/uL — ABNORMAL HIGH (ref 4.0–10.5)
nRBC: 0 % (ref 0.0–0.2)

## 2023-01-09 LAB — EXPECTORATED SPUTUM ASSESSMENT W GRAM STAIN, RFLX TO RESP C

## 2023-01-09 LAB — URINALYSIS, ROUTINE W REFLEX MICROSCOPIC
Bacteria, UA: NONE SEEN
Bilirubin Urine: NEGATIVE
Glucose, UA: NEGATIVE mg/dL
Ketones, ur: NEGATIVE mg/dL
Leukocytes,Ua: NEGATIVE
Nitrite: NEGATIVE
Protein, ur: 30 mg/dL — AB
Specific Gravity, Urine: 1.013 (ref 1.005–1.030)
pH: 5 (ref 5.0–8.0)

## 2023-01-09 LAB — I-STAT ARTERIAL BLOOD GAS, ED
Acid-base deficit: 6 mmol/L — ABNORMAL HIGH (ref 0.0–2.0)
Bicarbonate: 19.3 mmol/L — ABNORMAL LOW (ref 20.0–28.0)
Calcium, Ion: 1.17 mmol/L (ref 1.15–1.40)
HCT: 37 % — ABNORMAL LOW (ref 39.0–52.0)
Hemoglobin: 12.6 g/dL — ABNORMAL LOW (ref 13.0–17.0)
O2 Saturation: 99 %
Potassium: 3.5 mmol/L (ref 3.5–5.1)
Sodium: 143 mmol/L (ref 135–145)
TCO2: 20 mmol/L — ABNORMAL LOW (ref 22–32)
pCO2 arterial: 35.9 mmHg (ref 32–48)
pH, Arterial: 7.339 — ABNORMAL LOW (ref 7.35–7.45)
pO2, Arterial: 128 mmHg — ABNORMAL HIGH (ref 83–108)

## 2023-01-09 LAB — FERRITIN: Ferritin: 204 ng/mL (ref 24–336)

## 2023-01-09 LAB — LACTIC ACID, PLASMA
Lactic Acid, Venous: 1.6 mmol/L (ref 0.5–1.9)
Lactic Acid, Venous: 1.5 mmol/L (ref 0.5–1.9)

## 2023-01-09 LAB — PROTIME-INR
INR: 2.1 — ABNORMAL HIGH (ref 0.8–1.2)
Prothrombin Time: 23 seconds — ABNORMAL HIGH (ref 11.4–15.2)

## 2023-01-09 LAB — MAGNESIUM: Magnesium: 1.6 mg/dL — ABNORMAL LOW (ref 1.7–2.4)

## 2023-01-09 LAB — APTT: aPTT: 43 seconds — ABNORMAL HIGH (ref 24–36)

## 2023-01-09 LAB — TROPONIN I (HIGH SENSITIVITY)
Troponin I (High Sensitivity): 24 ng/L — ABNORMAL HIGH (ref <18)
Troponin I (High Sensitivity): 102 ng/L (ref <18)
Troponin I (High Sensitivity): 702 ng/L (ref <18)

## 2023-01-09 LAB — PHOSPHORUS: Phosphorus: 2.9 mg/dL (ref 2.5–4.6)

## 2023-01-09 LAB — PROCALCITONIN: Procalcitonin: 3.07 ng/mL

## 2023-01-09 LAB — BRAIN NATRIURETIC PEPTIDE: B Natriuretic Peptide: 195.7 pg/mL — ABNORMAL HIGH (ref 0.0–100.0)

## 2023-01-09 LAB — STREP PNEUMONIAE URINARY ANTIGEN: Strep Pneumo Urinary Antigen: NEGATIVE

## 2023-01-09 MED ORDER — ACETAMINOPHEN 650 MG RE SUPP
650.0000 mg | Freq: Once | RECTAL | Status: AC
  Administered 2023-01-09: 650 mg via RECTAL
  Filled 2023-01-09: qty 1

## 2023-01-09 MED ORDER — GUAIFENESIN-DM 100-10 MG/5ML PO SYRP
10.0000 mL | ORAL_SOLUTION | ORAL | Status: DC | PRN
  Administered 2023-01-09 – 2023-01-12 (×4): 10 mL via ORAL
  Filled 2023-01-09 (×4): qty 10

## 2023-01-09 MED ORDER — WARFARIN - PHARMACIST DOSING INPATIENT: Freq: Every day | Status: DC

## 2023-01-09 MED ORDER — PANTOPRAZOLE SODIUM 40 MG PO TBEC
40.0000 mg | DELAYED_RELEASE_TABLET | Freq: Every day | ORAL | Status: DC
  Administered 2023-01-09: 40 mg via ORAL
  Filled 2023-01-09: qty 1

## 2023-01-09 MED ORDER — VANCOMYCIN HCL IN DEXTROSE 1-5 GM/200ML-% IV SOLN: 1000.0000 mg | Freq: Once | INTRAVENOUS | Status: DC

## 2023-01-09 MED ORDER — LACTATED RINGERS IV SOLN: INTRAVENOUS | Status: DC

## 2023-01-09 MED ORDER — METOPROLOL SUCCINATE ER 50 MG PO TB24
50.0000 mg | ORAL_TABLET | Freq: Every day | ORAL | Status: DC
  Administered 2023-01-09 – 2023-01-14 (×6): 50 mg via ORAL
  Filled 2023-01-09 (×6): qty 1

## 2023-01-09 MED ORDER — MAGNESIUM SULFATE 2 GM/50ML IV SOLN
2.0000 g | Freq: Once | INTRAVENOUS | Status: AC
  Administered 2023-01-09: 2 g via INTRAVENOUS
  Filled 2023-01-09: qty 50

## 2023-01-09 MED ORDER — ACETAMINOPHEN 325 MG PO TABS
650.0000 mg | ORAL_TABLET | Freq: Four times a day (QID) | ORAL | Status: DC | PRN
  Administered 2023-01-09 – 2023-01-10 (×2): 650 mg via ORAL
  Filled 2023-01-09 (×2): qty 2

## 2023-01-09 MED ORDER — ONDANSETRON HCL 4 MG PO TABS: 4.0000 mg | ORAL_TABLET | Freq: Four times a day (QID) | ORAL | Status: DC | PRN

## 2023-01-09 MED ORDER — ONDANSETRON HCL 4 MG/2ML IJ SOLN: 4.0000 mg | Freq: Four times a day (QID) | INTRAMUSCULAR | Status: DC | PRN

## 2023-01-09 MED ORDER — LACTATED RINGERS IV BOLUS
1000.0000 mL | Freq: Once | INTRAVENOUS | Status: AC
  Administered 2023-01-09: 1000 mL via INTRAVENOUS

## 2023-01-09 MED ORDER — WARFARIN SODIUM 6 MG PO TABS
6.0000 mg | ORAL_TABLET | ORAL | Status: DC
  Administered 2023-01-09: 6 mg via ORAL
  Filled 2023-01-09: qty 1

## 2023-01-09 MED ORDER — ATORVASTATIN CALCIUM 80 MG PO TABS
80.0000 mg | ORAL_TABLET | Freq: Every day | ORAL | Status: DC
  Administered 2023-01-09 – 2023-01-15 (×7): 80 mg via ORAL
  Filled 2023-01-09 (×7): qty 1

## 2023-01-09 MED ORDER — DEXAMETHASONE SODIUM PHOSPHATE 10 MG/ML IJ SOLN
10.0000 mg | Freq: Once | INTRAMUSCULAR | Status: AC
  Administered 2023-01-09: 10 mg via INTRAVENOUS
  Filled 2023-01-09: qty 1

## 2023-01-09 MED ORDER — SODIUM CHLORIDE 0.9 % IV SOLN
200.0000 mg | Freq: Once | INTRAVENOUS | Status: AC
  Administered 2023-01-09: 200 mg via INTRAVENOUS
  Filled 2023-01-09: qty 40

## 2023-01-09 MED ORDER — TAMSULOSIN HCL 0.4 MG PO CAPS
0.4000 mg | ORAL_CAPSULE | Freq: Every day | ORAL | Status: DC
  Administered 2023-01-09 – 2023-01-15 (×7): 0.4 mg via ORAL
  Filled 2023-01-09 (×7): qty 1

## 2023-01-09 MED ORDER — ALBUTEROL SULFATE HFA 108 (90 BASE) MCG/ACT IN AERS
2.0000 | INHALATION_SPRAY | RESPIRATORY_TRACT | Status: DC | PRN
  Administered 2023-01-09: 2 via RESPIRATORY_TRACT

## 2023-01-09 MED ORDER — LACTATED RINGERS IV BOLUS: 1000.0000 mL | Freq: Once | INTRAVENOUS | Status: DC

## 2023-01-09 MED ORDER — VANCOMYCIN HCL IN DEXTROSE 1-5 GM/200ML-% IV SOLN: 1000.0000 mg | INTRAVENOUS | Status: DC

## 2023-01-09 MED ORDER — SODIUM CHLORIDE 0.9 % IV SOLN
2.0000 g | Freq: Once | INTRAVENOUS | Status: AC
  Administered 2023-01-09: 2 g via INTRAVENOUS
  Filled 2023-01-09: qty 12.5

## 2023-01-09 MED ORDER — LEVETIRACETAM 500 MG PO TABS
500.0000 mg | ORAL_TABLET | Freq: Two times a day (BID) | ORAL | Status: DC
  Administered 2023-01-09 – 2023-01-15 (×12): 500 mg via ORAL
  Filled 2023-01-09 (×12): qty 1

## 2023-01-09 MED ORDER — VANCOMYCIN HCL 1500 MG/300ML IV SOLN
1500.0000 mg | Freq: Once | INTRAVENOUS | Status: AC
  Administered 2023-01-09: 1500 mg via INTRAVENOUS
  Filled 2023-01-09: qty 300

## 2023-01-09 MED ORDER — ALBUTEROL SULFATE HFA 108 (90 BASE) MCG/ACT IN AERS
2.0000 | INHALATION_SPRAY | Freq: Four times a day (QID) | RESPIRATORY_TRACT | Status: DC
  Administered 2023-01-09: 2 via RESPIRATORY_TRACT
  Filled 2023-01-09: qty 6.7

## 2023-01-09 MED ORDER — PHENOL 1.4 % MT LIQD
1.0000 | OROMUCOSAL | Status: DC | PRN
  Administered 2023-01-09: 1 via OROMUCOSAL
  Filled 2023-01-09: qty 177

## 2023-01-09 MED ORDER — AMLODIPINE BESYLATE 5 MG PO TABS
5.0000 mg | ORAL_TABLET | Freq: Every day | ORAL | Status: DC
  Administered 2023-01-09 – 2023-01-15 (×7): 5 mg via ORAL
  Filled 2023-01-09 (×7): qty 1

## 2023-01-09 MED ORDER — ACETAMINOPHEN 650 MG RE SUPP: 650.0000 mg | Freq: Four times a day (QID) | RECTAL | Status: DC | PRN

## 2023-01-09 MED ORDER — SODIUM CHLORIDE 0.9 % IV SOLN
2.0000 g | Freq: Two times a day (BID) | INTRAVENOUS | Status: DC
  Administered 2023-01-09: 2 g via INTRAVENOUS
  Filled 2023-01-09: qty 12.5

## 2023-01-09 MED ORDER — WARFARIN SODIUM 3 MG PO TABS: 3.0000 mg | ORAL_TABLET | ORAL | Status: DC

## 2023-01-09 MED ORDER — VITAMIN C 500 MG PO TABS
500.0000 mg | ORAL_TABLET | Freq: Every day | ORAL | Status: DC
  Administered 2023-01-10 – 2023-01-15 (×6): 500 mg via ORAL
  Filled 2023-01-09 (×6): qty 1

## 2023-01-09 MED ORDER — LACTATED RINGERS IV BOLUS (SEPSIS)
1000.0000 mL | Freq: Once | INTRAVENOUS | Status: AC
  Administered 2023-01-09: 1000 mL via INTRAVENOUS

## 2023-01-09 MED ORDER — SODIUM CHLORIDE 0.9 % IV SOLN
100.0000 mg | Freq: Every day | INTRAVENOUS | Status: AC
  Administered 2023-01-10 – 2023-01-11 (×2): 100 mg via INTRAVENOUS
  Filled 2023-01-09 (×2): qty 20

## 2023-01-09 MED ORDER — DEXAMETHASONE SODIUM PHOSPHATE 10 MG/ML IJ SOLN
6.0000 mg | INTRAMUSCULAR | Status: DC
  Administered 2023-01-10: 6 mg via INTRAVENOUS
  Filled 2023-01-09: qty 1

## 2023-01-09 MED ORDER — ZINC SULFATE 220 (50 ZN) MG PO CAPS
220.0000 mg | ORAL_CAPSULE | Freq: Every day | ORAL | Status: DC
  Administered 2023-01-10 – 2023-01-15 (×6): 220 mg via ORAL
  Filled 2023-01-09 (×6): qty 1

## 2023-01-09 NOTE — ED Notes (Signed)
Help get patient undressed into a gown on the monitor did EKG shown to Dr Wyvonnia Dusky patient is resting with nurse at bedside and call bell in reach

## 2023-01-09 NOTE — H&P (Signed)
History and Physical    Patient: Roberto Dickson:[id number] DOB: 1941-10-25 DOA: 01/09/2023 DOS: the patient was seen and examined on 01/09/2023 PCP: Horald Pollen, MD  Patient coming from: Home  Chief Complaint:  Chief Complaint  Patient presents with   Shortness of Breath    Fever   Fever    102.9 oral by EMS, 102.7 in hospital   HPI: Roberto Dickson is a 82 y.o. 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  He was found to be febrile in the emergency department. No chest pain, palpitations, diaphoresis, PND, orthopnea or pitting edema of the lower extremities.  No abdominal pain, nausea, emesis, diarrhea, constipation, melena or hematochezia.  No flank pain, dysuria, frequency or hematuria.  No polyuria, polydipsia, polyphagia or blurred vision.   Lab work: His CBCs are white count 12.4, Mono 13.0 g/dL platelets 77.  PT 23.0, INR 2.1 and PTT 43.  Lactic acid is 1.6 mmol/L.  CMP showed a CO2 of 17 mmol/L with a normal anion gap.  Glucose 115, BUN 25, creatinine 1.59 and calcium 8.1 mg/dL.  LFTs and the rest of the electrolytes were normal.  Troponin was 24 ng/L.  An ABG showed a pH of 7.34 with a pCO2 of 36, pO2 of 128 mmHg.  Bicarbonate 19.3, TCO2 20 and acid-base deficit 6.0 mmol/L.  Imaging: Portable 1 view chest radiograph with bibasilar airspace opacities concerning for bilateral lower lobe pneumonia.  ED course: Initial vital signs were temperature 102.9 F, pulse 110, respirations 26, BP 136/76 mmHg O2 sat 92% on room air.  He is currently satting 100% on HFNC at 6 LPM.  In addition to supplemental oxygen the patient received dexamethasone 10 mg IVP LR 2000 mL bolus,  2 g of cefepime IVPB and IV vancomycin per pharmacy.   Review of Systems: As mentioned in the history of present illness. All other systems reviewed and are negative. Past Medical History:  Diagnosis Date   Hypertension    Past Surgical History:  Procedure Laterality Date   APPENDECTOMY     in his 84's   CORONARY ANGIOGRAPHY N/A 10/20/2021   Procedure: CORONARY ANGIOGRAPHY;  Surgeon: Burnell Blanks, MD;  Location: [facility];  Service: Cardiovascular;  Laterality: N/A;   IR CT HEAD LTD  07/19/2021   IR FLUORO GUIDED NEEDLE PLC ASPIRATION/INJECTION LOC  07/21/2021   IR PERCUTANEOUS ART THROMBECTOMY/INFUSION INTRACRANIAL INC DIAG ANGIO  07/19/2021   RADIOLOGY WITH ANESTHESIA N/A 07/19/2021   Procedure: IR WITH ANESTHESIA;  Surgeon: Luanne Bras, MD;  Location: [facility];  Service: Radiology;  Laterality: N/A;   Social History:  reports that he has never smoked. He has never used smokeless tobacco. He reports that he does not currently use alcohol after a past usage of about 1.0 standard drink of alcohol per week. He reports that he does not use drugs.  No Known Allergies  Family History  Problem Relation Age of Onset   Cancer - Prostate Father     Prior to Admission medications   Medication Sig Start Date End Date Taking? Authorizing Provider  acetaminophen (TYLENOL) 325 MG tablet Take 1-2 tablets (325-650 mg total) by mouth every 4 (four) hours as needed for mild pain. 08/10/21   Love,  Ivan Anchors, PA-C  amLODipine (NORVASC) 5 MG tablet Take 1 tablet (5 mg total) by mouth daily. 02/27/22   Horald Pollen, MD  aspirin EC (ASPIRIN LOW DOSE) 81 MG tablet Take 1 tablet (81 mg total) by mouth daily. Swallow whole. 10/19/22   Buford Dresser, MD  atorvastatin (LIPITOR) 80 MG tablet Take 1 tablet (80 mg total) by mouth daily. 02/09/22   Horald Pollen, MD  docusate sodium (COLACE) 100 MG capsule Take 1 capsule (100 mg total) by mouth 2 (two) times  daily. Patient taking differently: Take 100 mg by mouth daily. 08/10/21   Love, Ivan Anchors, PA-C  levETIRAcetam (KEPPRA) 500 MG tablet Take 1 tablet (500 mg total) by mouth 2 (two) times daily. 10/30/22   Horald Pollen, MD  melatonin 3 MG TABS tablet Take 0.5 tablets (1.5 mg total) by mouth at bedtime. OVER THE COUNTER Patient taking differently: Take 1.5 mg by mouth at bedtime. 08/10/21   Love, Ivan Anchors, PA-C  metoprolol succinate (TOPROL-XL) 50 MG 24 hr tablet TAKE 1 TABLET(50 MG) BY MOUTH AT BEDTIME Patient taking differently: Take 50 mg by mouth at bedtime. 11/20/22   Buford Dresser, MD  pantoprazole (PROTONIX) 40 MG tablet TAKE 1 TABLET(40 MG) BY MOUTH AT BEDTIME Patient taking differently: Take 40 mg by mouth at bedtime. 08/20/22   Horald Pollen, MD  tamsulosin (FLOMAX) 0.4 MG CAPS capsule Take one capsule by mouth daily. 10/18/22   Horald Pollen, MD  tiZANidine (ZANAFLEX) 2 MG tablet TAKE 1 TABLET BY MOUTH AT BEDTIME AS NEEDED FOR MUSCLE SPASMS Patient taking differently: Take 2 mg by mouth at bedtime as needed for muscle spasms. 11/18/22   Horald Pollen, MD  warfarin (COUMADIN) 6 MG tablet TAKE 1 TABLET BY MOUTH DAILY AS DIRECTED BY COAGULATION CLINIC 09/19/22   Buford Dresser, MD    Physical Exam: Vitals:   01/09/23 1015 01/09/23 1030 01/09/23 1100 01/09/23 1200  BP: (!) 142/82 (!) 142/84 (!) 146/78 135/84  Pulse: (!) 108 (!) 106 (!) 104 100  Resp: (!) 26 (!) 33 (!) 31 (!) 24  Temp:    (!) 101.9 F (38.8 C)  TempSrc:    Oral  SpO2: 92% 90% 93% 100%  Weight:    73.5 kg   Physical Exam Vitals and nursing note reviewed.  Constitutional:      General: He is awake. He is not in acute distress.    Appearance: He is ill-appearing.     Interventions: Nasal cannula in place.  HENT:     Head: Normocephalic.     Nose: No rhinorrhea.     Mouth/Throat:     Mouth: Mucous membranes are moist.  Eyes:     General: No scleral icterus.    Pupils:  Pupils are equal, round, and reactive to light.  Neck:     Vascular: No JVD.  Cardiovascular:     Rate and Rhythm: Normal rate and regular rhythm.     Heart sounds: S1 normal and S2 normal.  Pulmonary:     Effort: Pulmonary effort is normal. Tachypnea present. No accessory muscle usage.     Breath sounds: Examination of the right-lower field reveals rales. Examination of the left-lower field reveals rales. Rhonchi and rales present. No wheezing.  Abdominal:     General: Bowel sounds are normal. There is no distension.     Palpations: Abdomen is soft.     Tenderness: There is no abdominal tenderness. There is no guarding.  Musculoskeletal:  Cervical back: Neck supple.     Right lower leg: No edema.     Left lower leg: No edema.  Skin:    General: Skin is warm and dry.  Neurological:     General: No focal deficit present.     Mental Status: He is alert and oriented to person, place, and time.  Psychiatric:        Mood and Affect: Mood normal.        Behavior: Behavior normal. Behavior is cooperative.     Data Reviewed:  Results are pending, will review when available.  Assessment and Plan: Principal Problem:   Acute respiratory failure with hypoxia ([place]) In the setting of:   Pneumonia due to COVID-19 virus  Admit to PCU/inpatient. Supplemental oxygen as needed. Bronchodilators as needed. Incentive spirometry while awake. Flutter valve exercises. Antitussives as needed. Vitamin C/zinc supplementation Dexamethasone 6 mg IVP daily. Multiple potential meds interactions with Paxlovid. Remdesivir per pharmacy. Will continue bacterial infection coverage. Continue cefepime 2 g every 8 hours. Continue vancomycin per pharmacy. Check procalcitonin level today and in AM. Check strep pneumoniae urinary antigen. Check sputum Gram stain, culture and sensitivity. Follow-up blood culture and sensitivity. Follow-up CBC, CMP and inflammatory markers.  Active Problems:    Essential hypertension Continue amlodipine 5 mg p.o. daily. Continue metoprolol succinate 50 mg p.o. daily. Monitor blood pressure and heart rate.    Left ventricular thrombus Continue warfarin per pharmacy. Check daily PT/INR.    CAD (coronary artery disease) Continue atorvastatin, beta-blocker and warfarin.    Hyperlipidemia Continue atorvastatin 80 mg p.o. daily.    History of seizure Continue Keppra 500 mg p.o. twice daily.     Advance Care Planning:   Code Status: Full Code   Consults:   Family Communication: His spouse and daughter were present in the ED room.  Severity of Illness: The appropriate patient status for this patient is INPATIENT. Inpatient status is judged to be reasonable and necessary in order to provide the required intensity of service to ensure the patient's safety. The patient's presenting symptoms, physical exam findings, and initial radiographic and laboratory data in the context of their chronic comorbidities is felt to place them at high risk for further clinical deterioration. Furthermore, it is not anticipated that the patient will be medically stable for discharge from the hospital within 2 midnights of admission.   * I certify that at the point of admission it is my clinical judgment that the patient will require inpatient hospital care spanning beyond 2 midnights from the point of admission due to high intensity of service, high risk for further deterioration and high frequency of surveillance required.*  Author: Reubin Milan, MD 01/09/2023 1:19 PM  For on call review [email or web address].   This document was prepared using Dragon voice recognition software and may contain some unintended transcription errors.

## 2023-01-09 NOTE — ED Provider Notes (Signed)
[place] EMERGENCY DEPARTMENT AT [facility] Provider Note   CSN: [id number] Arrival date & time: 01/09/23  7322     History  Chief Complaint  Patient presents with   Shortness of Breath    Fever   Fever    102.9 oral by EMS, 102.7 in hospital    Roberto Dickson is a 82 y.o. male.  Patient presents by EMS as code sepsis.  Level 5 caveat for acute condition.  Patient does not speak [place].  EMS reports difficulty breathing for the past 4 to 5 days with cough and congestion.  Found to be febrile and tachycardic on arrival.  Generalized weakness unable to walk today.  Patient denies any chest pain.  Does not wear oxygen at home.  No history of COPD or asthma.  The history is provided by the patient and the EMS personnel.  Shortness of Breath Associated symptoms: fever   Fever      Home Medications Prior to Admission medications   Medication Sig Start Date End Date Taking? Authorizing Provider  acetaminophen (TYLENOL) 325 MG tablet Take 1-2 tablets (325-650 mg total) by mouth every 4 (four) hours as needed for mild pain. 08/10/21   Love, Evlyn Kanner, PA-C  amLODipine (NORVASC) 5 MG tablet Take 1 tablet (5 mg total) by mouth daily. 02/27/22   [name], MD  aspirin EC (ASPIRIN LOW DOSE) 81 MG tablet Take 1 tablet (81 mg total) by mouth daily. Swallow whole. 10/19/22   Jodelle Red, MD  atorvastatin (LIPITOR) 80 MG tablet Take 1 tablet (80 mg total) by mouth daily. 02/09/22   [name], MD  docusate sodium (COLACE) 100 MG capsule Take 1 capsule (100 mg total) by mouth 2 (two) times daily. Patient taking differently: Take 100 mg by mouth daily. 08/10/21   Love, Evlyn Kanner, PA-C  levETIRAcetam (KEPPRA) 500 MG tablet Take 1 tablet (500 mg total) by mouth 2 (two) times daily. 10/30/22   [name], MD  melatonin 3 MG TABS tablet Take 0.5 tablets (1.5 mg total) by mouth at bedtime. OVER THE COUNTER Patient taking differently: Take 1.5 mg by  mouth at bedtime. 08/10/21   Love, Evlyn Kanner, PA-C  metoprolol succinate (TOPROL-XL) 50 MG 24 hr tablet TAKE 1 TABLET(50 MG) BY MOUTH AT BEDTIME 11/20/22   Jodelle Red, MD  pantoprazole (PROTONIX) 40 MG tablet TAKE 1 TABLET(40 MG) BY MOUTH AT BEDTIME 08/20/22   [name], MD  tamsulosin (FLOMAX) 0.4 MG CAPS capsule Take one capsule by mouth daily. 10/18/22   [name], MD  tiZANidine (ZANAFLEX) 2 MG tablet TAKE 1 TABLET BY MOUTH AT BEDTIME AS NEEDED FOR MUSCLE SPASMS 11/18/22   [name], MD  warfarin (COUMADIN) 6 MG tablet TAKE 1 TABLET BY MOUTH DAILY AS DIRECTED BY COAGULATION CLINIC 09/19/22   Jodelle Red, MD      Allergies    Patient has no known allergies.    Review of Systems   Review of Systems  Unable to perform ROS: Acuity of condition  Constitutional:  Positive for fever.  Respiratory:  Positive for shortness of breath.     Physical Exam Updated Vital Signs BP 136/76 (BP Location: Right Arm)   Pulse (!) 110   Temp (!) 102.9 F (39.4 C) (Oral)   SpO2 92%  Physical Exam Vitals and nursing note reviewed.  Constitutional:      General: He is in acute distress.     Appearance: He is  well-developed. He is ill-appearing.     Comments: Tachypneic, moist cough, tachycardic Increased work of breathing  HENT:     Head: Normocephalic and atraumatic.     Mouth/Throat:     Pharynx: No oropharyngeal exudate.  Eyes:     Conjunctiva/sclera: Conjunctivae normal.     Pupils: Pupils are equal, round, and reactive to light.  Neck:     Comments: No meningismus. Cardiovascular:     Rate and Rhythm: Regular rhythm. Tachycardia present.     Heart sounds: Normal heart sounds. No murmur heard. Pulmonary:     Effort: Respiratory distress present.     Breath sounds: Rhonchi present.     Comments: Moderate respiratory distress with rhonchi throughout Abdominal:     Palpations: Abdomen is soft.     Tenderness: There is no abdominal  tenderness. There is no guarding or rebound.  Musculoskeletal:        General: No tenderness. Normal range of motion.     Cervical back: Normal range of motion and neck supple.  Skin:    General: Skin is warm.  Neurological:     Mental Status: He is alert and oriented to person, place, and time.     Cranial Nerves: No cranial nerve deficit.     Motor: No abnormal muscle tone.     Coordination: Coordination normal.     Comments: No ataxia on finger to nose bilaterally. No pronator drift. 5/5 strength throughout. CN 2-12 intact.Equal grip strength. Sensation intact.   Psychiatric:        Behavior: Behavior normal.     ED Results / Procedures / Treatments   Labs (all labs ordered are listed, but only abnormal results are displayed) Labs Reviewed  RESP PANEL BY RT-PCR (RSV, FLU A&B, COVID)  RVPGX2 - Abnormal; Notable for the following components:      Result Value   SARS Coronavirus 2 by RT PCR POSITIVE (*)    All other components within normal limits  COMPREHENSIVE METABOLIC PANEL - Abnormal; Notable for the following components:   CO2 17 (*)    Glucose, Bld 115 (*)    BUN 25 (*)    Creatinine, Ser 1.59 (*)    Calcium 8.1 (*)    GFR, Estimated 44 (*)    All other components within normal limits  CBC WITH DIFFERENTIAL/PLATELET - Abnormal; Notable for the following components:   WBC 12.4 (*)    RBC 4.13 (*)    Neutro Abs 9.5 (*)    Lymphs Abs 0.5 (*)    Monocytes Absolute 2.3 (*)    All other components within normal limits  PROTIME-INR - Abnormal; Notable for the following components:   Prothrombin Time 23.0 (*)    INR 2.1 (*)    All other components within normal limits  APTT - Abnormal; Notable for the following components:   aPTT 43 (*)    All other components within normal limits  I-STAT ARTERIAL BLOOD GAS, ED - Abnormal; Notable for the following components:   pH, Arterial 7.339 (*)    pO2, Arterial 128 (*)    Bicarbonate 19.3 (*)    TCO2 20 (*)    Acid-base  deficit 6.0 (*)    HCT 37.0 (*)    Hemoglobin 12.6 (*)    All other components within normal limits  TROPONIN I (HIGH SENSITIVITY) - Abnormal; Notable for the following components:   Troponin I (High Sensitivity) 24 (*)    All other components within normal limits  EXPECTORATED  SPUTUM ASSESSMENT W GRAM STAIN, RFLX TO RESP C  CULTURE, RESPIRATORY W GRAM STAIN  CULTURE, BLOOD (ROUTINE X 2)  CULTURE, BLOOD (ROUTINE X 2)  RESP PANEL BY RT-PCR (RSV, FLU A&B, COVID)  RVPGX2  MRSA NEXT GEN BY PCR, NASAL  LACTIC ACID, PLASMA  LACTIC ACID, PLASMA  URINALYSIS, ROUTINE W REFLEX MICROSCOPIC  C-REACTIVE PROTEIN  BRAIN NATRIURETIC PEPTIDE  FERRITIN  PROCALCITONIN  MAGNESIUM  PHOSPHORUS  TROPONIN I (HIGH SENSITIVITY)    EKG EKG Interpretation  Date/Time:  Tuesday January 09 2023 10:05:18 EST Ventricular Rate:  115 PR Interval:  200 QRS Duration: 100 QT Interval:  297 QTC Calculation: 411 R Axis:   -25 Text Interpretation: Sinus tachycardia Atrial premature complex Abnormal R-wave progression, early transition Inferior infarct, acute (RCA) Probable RV involvement, suggest recording right precordial leads >>> Acute MI <<< ST elevation III, avF ST depression I avL, d/w Dr. Martinique Confirmed by Ezequiel Essex [phone]) on 01/09/2023 10:22:34 AM  Radiology EEG adult  Result Date: 01/08/2023       [facility] [address] [place]. [place] [address] [phone]      Electroencephalogram Procedure Note Mr. JAXTYN LINVILLE Date of Birth:  1941/06/17 Medical Record Number:  [id number] Indications: Diagnostic Date of Procedure 01/04/2023 Medications: levetiracetam (Keppra) Clinical history : 82 year old patient being evaluated for seizures Technical Description This study was performed using 17 channel digital electroencephalographic recording equipment. International 10-20 electrode placement was used. The record was obtained with the patient awake, drowsy, and asleep.  The record is of  fair technical quality for purposes of interpretation. Activation Procedures:  photic stimulation . EEG Description Awake: Alpha Activity: The waking state record contains a well-defined bi-occipital alpha rhythm of  moderate amplitude with a dominant frequency of 10 Hz. Reactivity is uncertain. No paroxsymal activity, spikes, or sharp waves are noted. Technical component of study is adequate. EKG tracing shows regular sinus rhythm Length of this recording is 24 minutes and 31 seconds Sleep: With drowsiness, there is attenuation of the background alpha activity. As the patient enters into light sleep, vertex waves and symmetrical spindles are noted. K complexes are noted in sleep. Transition to the waking state is unremarkable. Result of Activation Procedures: Hyperventilation: N/A. Photo Stimulation: No photic driving response is noted. Summary Normal electroencephalogram, awake, asleep and with activation procedures. There are no focal lateralizing or epileptiform features.    Procedures .Critical Care  Performed by: Ezequiel Essex, MD Authorized by: Ezequiel Essex, MD   Critical care provider statement:    Critical care time (minutes):  60   Critical care time was exclusive of:  Separately billable procedures and treating other patients   Critical care was necessary to treat or prevent imminent or life-threatening deterioration of the following conditions:  Respiratory failure and shock   Critical care was time spent personally by me on the following activities:  Development of treatment plan with patient or surrogate, discussions with consultants, evaluation of patient's response to treatment, examination of patient, ordering and review of laboratory studies, ordering and review of radiographic studies, ordering and performing treatments and interventions, pulse oximetry, re-evaluation of patient's condition, review of old charts and blood draw for specimens   I assumed direction of critical care  for this patient from another provider in my specialty: no     Care discussed with: admitting provider       Medications Ordered in ED Medications  lactated ringers infusion (has no administration in time range)  lactated ringers bolus  1,000 mL (has no administration in time range)  vancomycin (VANCOCIN) IVPB 1000 mg/200 mL premix (has no administration in time range)  ceFEPIme (MAXIPIME) 2 g in sodium chloride 0.9 % 100 mL IVPB (has no administration in time range)    ED Course/ Medical Decision Making/ A&P                             Medical Decision Making Amount and/or Complexity of Data Reviewed Independent Historian: EMS Labs: ordered. Decision-making details documented in ED Course. Radiology: ordered and independent interpretation performed. Decision-making details documented in ED Course. ECG/medicine tests: ordered and independent interpretation performed. Decision-making details documented in ED Course.  Risk OTC drugs. Prescription drug management. Decision regarding hospitalization.   Patient arrives with difficulty breathing, shortness of breath found to be febrile and tachycardic.  Code sepsis activated on arrival.  Patient started on IV fluids and IV antibiotics after cultures were obtained.  EKG shows some ST elevation in lead III and aVF with Q waves.  ST depression in lead I and aVL.  Patient denies any chest pain.  Discussed with on-call interventional cardiologist Dr. Martinique.  He looked at patient's EKG and feels this is not representative of STEMI.  Recommends medical treatment at this time.  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  Patient on nonrebreather.  Discussed with patient's daughter Verdis Frederickson by phone.  She agrees he is a full code  and is comfortable with intubation as necessary.  Patient transition to nonrebreather.  Work of breathing and difficulty clearing secretions has improved with deep suctioning.  ABG without significant CO2 retention.  Work of breathing much improved and patient transition to high flow nasal cannula oxygen of 6 L.  Concern for aspiration pneumonia.  He is also found to be COVID-positive.  Given dose of Decadron.  Fluid will be stopped at this time given COVID-positive state and adequate blood pressures. Lactate is normal.  Work of breathing has improved.  Patient has stabilized on 6 L nasal cannula oxygen and is breathing comfortably.  Continues to control his secretions and expectorate.  Will hold on intubation at this time.  Admission discussed with Dr. Olevia Bowens        Final Clinical Impression(s) / ED Diagnoses Final diagnoses:  Acute respiratory failure with hypoxia ([place])  COVID-19  Community acquired pneumonia, unspecified laterality    Rx / DC Orders ED Discharge Orders     None         Reegan Mctighe, Annie Main, MD 01/09/23 1338

## 2023-01-09 NOTE — Progress Notes (Signed)
Pharmacy Antibiotic Note  Roberto Dickson is a 82 y.o. male admitted on 01/09/2023 with pneumonia.  Pharmacy has been consulted for vancomycin and cefepime dosing. Tmax 102.76F, WBC 12.4, Scr appears at baseline at 1.59. Hand-calculated CrCl 41 mL/min using most recent weight 78 kg.   Plan: Vancomycin 1500 mg IV x 1, then 1000 mg IV q24h (eAUC 537.1 using Vd 0.72, Scr 1.59)  Cefepime 2g IV q12h  Monitor C&S, s/sx improvement, renal function, levels at SS as indicated     Temp (24hrs), Avg:102.9 F (39.4 C), Min:102.9 F (39.4 C), Max:102.9 F (39.4 C)  Recent Labs  Lab 01/09/23 1010  WBC 12.4*  CREATININE 1.59*  LATICACIDVEN 1.6    CrCl cannot be calculated (Unknown ideal weight.).    No Known Allergies  Antimicrobials this admission: vancomycin 2/6 >>  cefepime 2/6 >>   Dose adjustments this admission:   Microbiology results: 2/6 BCx: sent 2/6 Sputum: sent  2/6 MRSA PCR: sent 2/6 RVP: COVID-19 positive   Thank you for allowing pharmacy to be a part of this patient's care.  Eliseo Gum, PharmD PGY1 Pharmacy Resident   01/09/2023  11:51 AM

## 2023-01-09 NOTE — Progress Notes (Signed)
Patient with audible rhonchi, Increased patients O2 to 8 L due to SATs of 86%. NT suctioned patient at this time, obtaining copious amounts of yellowish, tan thickish secretions. Patient tolerated well. Patient with strong cough afterwards, coughing up small amount more. Will continue to monitor.

## 2023-01-09 NOTE — Sepsis Progress Note (Signed)
Sepsis protocol monitored by eLink ?

## 2023-01-09 NOTE — ED Triage Notes (Signed)
Arrives via EMS from home with SOB, fever for past 4 days. Unable to to walk today. Fever102.7 Per EMS. Possible N/V per wife.

## 2023-01-09 NOTE — ED Notes (Signed)
ED TO INPATIENT HANDOFF REPORT  ED Nurse Name and Phone #:  Nicki Reaper [phone]  S Name/Age/Gender Roberto Dickson 82 y.o. male Room/Bed: 026C/026C  Code Status   Code Status: Full Code  Home/SNF/Other Home Patient oriented to: self, place, time, and situation Is this baseline? Yes   Triage Complete: Triage complete  Chief Complaint Acute respiratory failure with hypoxia ([facility]) [J96.01]  Triage Note Arrives via EMS from home with SOB, fever for past 4 days. Unable to to walk today. Fever102.7 Per EMS. Possible N/V per wife.   Allergies No Known Allergies  Level of Care/Admitting Diagnosis ED Disposition     ED Disposition  Admit   Condition  --   [place]: [facility] [[id number]]  Level of Care: Progressive [102]  Admit to Progressive based on following criteria: RESPIRATORY PROBLEMS hypoxemic/hypercapnic respiratory failure that is responsive to NIPPV (BiPAP) or High Flow Nasal Cannula (6-80 lpm). Frequent assessment/intervention, no > Q2 hrs < Q4 hrs, to maintain oxygenation and pulmonary hygiene.  May admit patient to [facility] or [facility] if equivalent level of care is available:: No  Covid Evaluation: Confirmed COVID Positive  Diagnosis: Acute respiratory failure with hypoxia [facility]) [[id number]]  Admitting Physician: Reubin Milan [[id number]]  Attending Physician: Reubin Milan [[id number]]  Certification:: I certify this patient will need inpatient services for at least 2 midnights  Estimated Length of Stay: 4          B Medical/Surgery History Past Medical History:  Diagnosis Date   Hypertension    Past Surgical History:  Procedure Laterality Date   APPENDECTOMY     in his 34's   CORONARY ANGIOGRAPHY N/A 10/20/2021   Procedure: CORONARY ANGIOGRAPHY;  Surgeon: Burnell Blanks, MD;  Location: [facility];  Service: Cardiovascular;  Laterality: N/A;   IR CT HEAD LTD  07/19/2021   IR FLUORO GUIDED NEEDLE  PLC ASPIRATION/INJECTION LOC  07/21/2021   IR PERCUTANEOUS ART THROMBECTOMY/INFUSION INTRACRANIAL INC DIAG ANGIO  07/19/2021   RADIOLOGY WITH ANESTHESIA N/A 07/19/2021   Procedure: IR WITH ANESTHESIA;  Surgeon: Luanne Bras, MD;  Location: [facility];  Service: Radiology;  Laterality: N/A;     A IV Location/Drains/Wounds Patient Lines/Drains/Airways Status     Active Line/Drains/Airways     Name Placement date Placement time Site Days   Peripheral IV 01/09/23 18 G 1" Anterior;Distal;Left;Upper Arm 01/09/23  1004  Arm  less than 1   Peripheral IV 01/09/23 20 G 1" Anterior;Left Forearm 01/09/23  1023  Forearm  less than 1            Intake/Output Last 24 hours  Intake/Output Summary (Last 24 hours) at 01/09/2023 1323 Last data filed at 01/09/2023 1147 Gross per 24 hour  Intake 900 ml  Output --  Net 900 ml    Labs/Imaging Results for orders placed or performed during the hospital encounter of 01/09/23 (from the past 48 hour(s))  Lactic acid, plasma     Status: None   Collection Time: 01/09/23 10:10 AM  Result Value Ref Range   Lactic Acid, Venous 1.6 0.5 - 1.9 mmol/L    Comment: Performed at [facility], [address], [place], [place] [address]  Comprehensive metabolic panel     Status: Abnormal   Collection Time: 01/09/23 10:10 AM  Result Value Ref Range   Sodium 139 135 - 145 mmol/L   Potassium 3.6 3.5 - 5.1 mmol/L   Chloride 111 98 - 111  mmol/L   CO2 17 (L) 22 - 32 mmol/L   Glucose, Bld 115 (H) 70 - 99 mg/dL    Comment: Glucose reference range applies only to samples taken after fasting for at least 8 hours.   BUN 25 (H) 8 - 23 mg/dL   Creatinine, Ser 5.32 (H) 0.61 - 1.24 mg/dL   Calcium 8.1 (L) 8.9 - 10.3 mg/dL   Total Protein 6.9 6.5 - 8.1 g/dL   Albumin 3.7 3.5 - 5.0 g/dL   AST 34 15 - 41 U/L   ALT 34 0 - 44 U/L   Alkaline Phosphatase 63 38 - 126 U/L   Total Bilirubin 1.0 0.3 - 1.2 mg/dL   GFR, Estimated 44 (L) >60 mL/min    Comment:  (NOTE) Calculated using the CKD-EPI Creatinine Equation (2021)    Anion gap 11 5 - 15    Comment: Performed at [facility], [address], [place], [place] [address]  CBC with Differential     Status: Abnormal   Collection Time: 01/09/23 10:10 AM  Result Value Ref Range   WBC 12.4 (H) 4.0 - 10.5 K/uL   RBC 4.13 (L) 4.22 - 5.81 MIL/uL   Hemoglobin 13.0 13.0 - 17.0 g/dL   HCT 68.3 41.9 - 62.2 %   MCV 95.6 80.0 - 100.0 fL   MCH 31.5 26.0 - 34.0 pg   MCHC 32.9 30.0 - 36.0 g/dL   RDW 29.7 98.9 - 21.1 %   Platelets 159 150 - 400 K/uL   nRBC 0.0 0.0 - 0.2 %   Neutrophils Relative % 77 %   Neutro Abs 9.5 (H) 1.7 - 7.7 K/uL   Lymphocytes Relative 4 %   Lymphs Abs 0.5 (L) 0.7 - 4.0 K/uL   Monocytes Relative 18 %   Monocytes Absolute 2.3 (H) 0.1 - 1.0 K/uL   Eosinophils Relative 0 %   Eosinophils Absolute 0.0 0.0 - 0.5 K/uL   Basophils Relative 0 %   Basophils Absolute 0.0 0.0 - 0.1 K/uL   Immature Granulocytes 1 %   Abs Immature Granulocytes 0.06 0.00 - 0.07 K/uL    Comment: Performed at [facility], [address], [place], [place] [address]  Protime-INR     Status: Abnormal   Collection Time: 01/09/23 10:10 AM  Result Value Ref Range   Prothrombin Time 23.0 (H) 11.4 - 15.2 seconds   INR 2.1 (H) 0.8 - 1.2    Comment: (NOTE) INR goal varies based on device and disease states. Performed at [facility], [address] [place], [place] [address]   APTT     Status: Abnormal   Collection Time: 01/09/23 10:10 AM  Result Value Ref Range   aPTT 43 (H) 24 - 36 seconds    Comment:        IF BASELINE aPTT IS ELEVATED, SUGGEST PATIENT RISK ASSESSMENT BE USED TO DETERMINE APPROPRIATE ANTICOAGULANT THERAPY. Performed at [facility], [address], [place], [place] [address]   Troponin I (High Sensitivity)     Status: Abnormal   Collection Time: 01/09/23 10:10 AM  Result Value Ref Range   Troponin I (High Sensitivity) 24 (H) <18 ng/L    Comment:  (NOTE) Elevated high sensitivity troponin I (hsTnI) values and significant  changes across serial measurements may suggest ACS but many other  chronic and acute conditions are known to elevate hsTnI results.  Refer to the "Links" section for chest pain algorithms and additional  guidance. Performed at [facility], [place] [address] [place], [place] [address]   Resp panel by RT-PCR (RSV, Flu A&B, Covid) Anterior Nasal Swab     Status: Abnormal   Collection Time: 01/09/23 10:16 AM   Specimen: Anterior Nasal Swab  Result Value Ref Range   SARS Coronavirus 2 by RT PCR POSITIVE (A) NEGATIVE   Influenza A by PCR NEGATIVE NEGATIVE   Influenza B by PCR NEGATIVE NEGATIVE    Comment: (NOTE) The Xpert Xpress SARS-CoV-2/FLU/RSV plus assay is intended as an aid in the diagnosis of influenza from Nasopharyngeal swab specimens and should not be used as a sole basis for treatment. Nasal washings and aspirates are unacceptable for Xpert Xpress SARS-CoV-2/FLU/RSV testing.  Fact Sheet for Patients: EntrepreneurPulse.com.au  Fact Sheet for Healthcare Providers: IncredibleEmployment.be  This test is not yet approved or cleared by the [place] FDA and has been authorized for detection and/or diagnosis of SARS-CoV-2 by FDA under an Emergency Use Authorization (EUA). This EUA will remain in effect (meaning this test can be used) for the duration of the COVID-19 declaration under Section 564(b)(1) of the Act, 21 U.S.C. section 360bbb-3(b)(1), unless the authorization is terminated or revoked.     Resp Syncytial Virus by PCR NEGATIVE NEGATIVE    Comment: (NOTE) Fact Sheet for Patients: EntrepreneurPulse.com.au  Fact Sheet for Healthcare Providers: IncredibleEmployment.be  This test is not yet approved or cleared by the [place] FDA and has been authorized for detection and/or diagnosis of SARS-CoV-2 by FDA  under an Emergency Use Authorization (EUA). This EUA will remain in effect (meaning this test can be used) for the duration of the COVID-19 declaration under Section 564(b)(1) of the Act, 21 U.S.C. section 360bbb-3(b)(1), unless the authorization is terminated or revoked.  Performed at [facility], [place] [address], [place], [place] [address]   I-Stat arterial blood gas, ED [facility], [facility], [facility])     Status: Abnormal   Collection Time: 01/09/23 12:05 PM  Result Value Ref Range   pH, Arterial 7.339 (L) 7.35 - 7.45   pCO2 arterial 35.9 32 - 48 mmHg   pO2, Arterial 128 (H) 83 - 108 mmHg   Bicarbonate 19.3 (L) 20.0 - 28.0 mmol/L   TCO2 20 (L) 22 - 32 mmol/L   O2 Saturation 99 %   Acid-base deficit 6.0 (H) 0.0 - 2.0 mmol/L   Sodium 143 135 - 145 mmol/L   Potassium 3.5 3.5 - 5.1 mmol/L   Calcium, Ion 1.17 1.15 - 1.40 mmol/L   HCT 37.0 (L) 39.0 - 52.0 %   Hemoglobin 12.6 (L) 13.0 - 17.0 g/dL   Sample type ARTERIAL    DG Chest Port 1 View  Result Date: 01/09/2023 CLINICAL DATA:  Questionable sepsis - evaluate for abnormality EXAM: PORTABLE CHEST 1 VIEW COMPARISON:  Radiograph 03/01/2022 FINDINGS: Stable cardiac silhouette. Atherosclerotic calcification of the aorta. There is new bibasilar airspace opacity. Upper lungs clear. No pneumothorax. IMPRESSION: Bibasilar airspace opacities concerning for bilateral lower lobe pneumonia. Electronically Signed   By: Suzy Bouchard M.D.   On: 01/09/2023 11:16   EEG adult        [facility] [place]. [place] [address] [phone]      Electroencephalogram Procedure Note Mr. TAHIR BLANK Date of Birth:  1941/10/24 Medical Record Number:  [id number] Indications: Diagnostic Date of Procedure 01/04/2023 Medications: levetiracetam (Keppra) Clinical history : 82 year old patient being evaluated for seizures Technical Description This study was performed using 17  channel digital electroencephalographic recording equipment.  International 10-20 electrode placement was used. The record was obtained with the patient awake, drowsy, and asleep.  The record is of fair technical quality for purposes of interpretation. Activation Procedures:  photic stimulation . EEG Description Awake: Alpha Activity: The waking state record contains a well-defined bi-occipital alpha rhythm of  moderate amplitude with a dominant frequency of 10 Hz. Reactivity is uncertain. No paroxsymal activity, spikes, or sharp waves are noted. Technical component of study is adequate. EKG tracing shows regular sinus rhythm Length of this recording is 24 minutes and 31 seconds Sleep: With drowsiness, there is attenuation of the background alpha activity. As the patient enters into light sleep, vertex waves and symmetrical spindles are noted. K complexes are noted in sleep. Transition to the waking state is unremarkable. Result of Activation Procedures: Hyperventilation: N/A. Photo Stimulation: No photic driving response is noted. Summary Normal electroencephalogram, awake, asleep and with activation procedures. There are no focal lateralizing or epileptiform features.    Pending Labs Unresulted Labs (From admission, onward)     Start     Ordered   01/10/23 0500  Phosphorus  Daily,   R      01/09/23 1315   01/10/23 0500  Magnesium  Daily,   R      01/09/23 1315   01/10/23 0500  Ferritin  Daily,   R      01/09/23 1315   01/10/23 0500  D-dimer, quantitative  Daily,   R      01/09/23 1315   01/10/23 0500  Comprehensive metabolic panel  Daily,   R      01/09/23 1315   01/10/23 0500  C-reactive protein  Daily,   R      01/09/23 1315   01/10/23 0500  CBC with Differential/Platelet  Daily,   R      01/09/23 1315   01/09/23 1319  Magnesium  Add-on,   AD        01/09/23 1319   01/09/23 1319  Phosphorus  Add-on,   AD        01/09/23 1319   01/09/23 1315  C-reactive protein  Add-on,   AD        01/09/23 1315   01/09/23 1315  Brain natriuretic peptide  Add-on,    AD        01/09/23 1315   01/09/23 1315  Ferritin  Add-on,   AD        01/09/23 1315   01/09/23 1315  Procalcitonin  Add-on,   AD        01/09/23 1315   01/09/23 1041  Expectorated Sputum Assessment w Gram Stain, Rflx to Resp Cult  Once,   R        01/09/23 1040   01/09/23 1024  MRSA Next Gen by PCR, Nasal  (MRSA Screening)  Once,   URGENT        01/09/23 1024   01/09/23 1017  Resp panel by RT-PCR (RSV, Flu A&B, Covid) Anterior Nasal Swab  (Tier 2 - SymptomaticResp panel by RT-PCR (RSV, Flu A&B, Covid))  Once,   URGENT        01/09/23 1017   01/09/23 1016  Lactic acid, plasma  (Septic presentation on arrival (screening labs, nursing and treatment orders for obvious sepsis))  Now then every 2 hours,   R      01/09/23 1017   01/09/23 1016  Blood Culture (routine x 2)  (Septic presentation on arrival (screening labs,  nursing and treatment orders for obvious sepsis))  BLOOD CULTURE X 2,   STAT      01/09/23 1017   01/09/23 1016  Urinalysis, Routine w reflex microscopic -Urine, Clean Catch  (Septic presentation on arrival (screening labs, nursing and treatment orders for obvious sepsis))  ONCE - URGENT,   URGENT       Question:  Specimen Source  Answer:  Urine, Clean Catch   01/09/23 1017            Vitals/Pain Today's Vitals   01/09/23 1015 01/09/23 1030 01/09/23 1100 01/09/23 1200  BP: (!) 142/82 (!) 142/84 (!) 146/78 135/84  Pulse: (!) 108 (!) 106 (!) 104 100  Resp: (!) 26 (!) 33 (!) 31 (!) 24  Temp:    (!) 101.9 F (38.8 C)  TempSrc:    Oral  SpO2: 92% 90% 93% 100%  Weight:    73.5 kg    Isolation Precautions Airborne and Contact precautions  Medications Medications  vancomycin (VANCOREADY) IVPB 1500 mg/300 mL (1,500 mg Intravenous New Bag/Given 01/09/23 1223)  ceFEPIme (MAXIPIME) 2 g in sodium chloride 0.9 % 100 mL IVPB (has no administration in time range)  vancomycin (VANCOCIN) IVPB 1000 mg/200 mL premix (has no administration in time range)  remdesivir 200 mg in sodium  chloride 0.9% 250 mL IVPB (has no administration in time range)    Followed by  remdesivir 100 mg in sodium chloride 0.9 % 100 mL IVPB (has no administration in time range)  dexamethasone (DECADRON) injection 6 mg (has no administration in time range)  guaiFENesin-dextromethorphan (ROBITUSSIN DM) 100-10 MG/5ML syrup 10 mL (has no administration in time range)  ascorbic acid (VITAMIN C) tablet 500 mg (has no administration in time range)  zinc sulfate capsule 220 mg (has no administration in time range)  albuterol (VENTOLIN HFA) 108 (90 Base) MCG/ACT inhaler 2 puff (has no administration in time range)  acetaminophen (TYLENOL) tablet 650 mg (has no administration in time range)    Or  acetaminophen (TYLENOL) suppository 650 mg (has no administration in time range)  ondansetron (ZOFRAN) tablet 4 mg (has no administration in time range)    Or  ondansetron (ZOFRAN) injection 4 mg (has no administration in time range)  lactated ringers bolus 1,000 mL (0 mLs Intravenous Stopped 01/09/23 1147)  ceFEPIme (MAXIPIME) 2 g in sodium chloride 0.9 % 100 mL IVPB (0 g Intravenous Stopped 01/09/23 1147)  lactated ringers bolus 1,000 mL (0 mLs Intravenous Stopped 01/09/23 1223)  acetaminophen (TYLENOL) suppository 650 mg (650 mg Rectal Given 01/09/23 1227)  dexamethasone (DECADRON) injection 10 mg (10 mg Intravenous Given 01/09/23 1224)    Mobility walks     Focused Assessments Pulmonary Assessment Handoff:  Lung sounds:   O2 Device: High Flow Nasal Cannula O2 Flow Rate (L/min): 6 L/min    R Recommendations: See Admitting Provider Note  Report given to:   Additional Notes: Pt arrives with cough, fever for past 2 days. Aox4, COVID +, on 6lL HFNC, congested with sputum,

## 2023-01-09 NOTE — Progress Notes (Signed)
ANTICOAGULATION CONSULT NOTE - Initial Consult  Pharmacy Consult for warfarin Indication:  h/o CVA and LV thrombus  No Known Allergies  Patient Measurements: Weight: 73.5 kg (162 lb)  Vital Signs: Temp: 100.9 F (38.3 C) (02/06 1433) Temp Source: Oral (02/06 1433) BP: 107/68 (02/06 1400) Pulse Rate: 74 (02/06 1400)  Labs: Recent Labs    01/09/23 1010 01/09/23 1205 01/09/23 1317  HGB 13.0 12.6*  --   HCT 39.5 37.0*  --   PLT 159  --   --   APTT 43*  --   --   LABPROT 23.0*  --   --   INR 2.1*  --   --   CREATININE 1.59*  --   --   TROPONINIHS 24*  --  102*    Estimated Creatinine Clearance: 34.6 mL/min (A) (by C-G formula based on SCr of 1.59 mg/dL (H)).   Medical History: Past Medical History:  Diagnosis Date   Hypertension     Medications:  Medications Prior to Admission  Medication Sig Dispense Refill Last Dose   acetaminophen (TYLENOL) 325 MG tablet Take 1-2 tablets (325-650 mg total) by mouth every 4 (four) hours as needed for mild pain.      amLODipine (NORVASC) 5 MG tablet Take 1 tablet (5 mg total) by mouth daily. 90 tablet 3    aspirin EC (ASPIRIN LOW DOSE) 81 MG tablet Take 1 tablet (81 mg total) by mouth daily. Swallow whole. 90 tablet 3    atorvastatin (LIPITOR) 80 MG tablet Take 1 tablet (80 mg total) by mouth daily. 90 tablet 3    docusate sodium (COLACE) 100 MG capsule Take 1 capsule (100 mg total) by mouth 2 (two) times daily. (Patient taking differently: Take 100 mg by mouth daily.) 60 capsule 0    levETIRAcetam (KEPPRA) 500 MG tablet Take 1 tablet (500 mg total) by mouth 2 (two) times daily. 60 tablet 2    melatonin 3 MG TABS tablet Take 0.5 tablets (1.5 mg total) by mouth at bedtime. OVER THE COUNTER (Patient taking differently: Take 1.5 mg by mouth at bedtime.) 30 tablet 0    metoprolol succinate (TOPROL-XL) 50 MG 24 hr tablet TAKE 1 TABLET(50 MG) BY MOUTH AT BEDTIME (Patient taking differently: Take 50 mg by mouth at bedtime.) 90 tablet 1     pantoprazole (PROTONIX) 40 MG tablet TAKE 1 TABLET(40 MG) BY MOUTH AT BEDTIME (Patient taking differently: Take 40 mg by mouth at bedtime.) 90 tablet 1    tamsulosin (FLOMAX) 0.4 MG CAPS capsule Take one capsule by mouth daily. 90 capsule 0    tiZANidine (ZANAFLEX) 2 MG tablet TAKE 1 TABLET BY MOUTH AT BEDTIME AS NEEDED FOR MUSCLE SPASMS (Patient taking differently: Take 2 mg by mouth at bedtime as needed for muscle spasms.) 30 tablet 1    warfarin (COUMADIN) 6 MG tablet TAKE 1 TABLET BY MOUTH DAILY AS DIRECTED BY COAGULATION CLINIC 100 tablet 1    Scheduled:   albuterol  2 puff Inhalation Q6H   vitamin C  500 mg Oral Daily   [START ON 01/10/2023] dexamethasone (DECADRON) injection  6 mg Intravenous Q24H   zinc sulfate  220 mg Oral Daily   Infusions:   ceFEPime (MAXIPIME) IV     magnesium sulfate bolus IVPB     remdesivir 200 mg in sodium chloride 0.9% 250 mL IVPB     Followed by   Derrill Memo ON 01/10/2023] remdesivir 100 mg in sodium chloride 0.9 % 100 mL IVPB     [  START ON 01/10/2023] vancomycin      Assessment: 82yo male c/o SOB and fever x4d, admitted for Covid, 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  Goal of Therapy:  INR 2-3   Plan:  Continue home Coumadin regimen of 6mg  daily except 3mg  on Mondays. Monitor INR.  Wynona Neat, PharmD, BCPS  01/09/2023,4:22 PM

## 2023-01-10 ENCOUNTER — Ambulatory Visit: Payer: Medicare Other | Admitting: Emergency Medicine

## 2023-01-10 ENCOUNTER — Inpatient Hospital Stay (HOSPITAL_COMMUNITY): Payer: Medicare Other

## 2023-01-10 DIAGNOSIS — U071 COVID-19: Secondary | ICD-10-CM

## 2023-01-10 DIAGNOSIS — I255 Ischemic cardiomyopathy: Secondary | ICD-10-CM

## 2023-01-10 DIAGNOSIS — E7800 Pure hypercholesterolemia, unspecified: Secondary | ICD-10-CM

## 2023-01-10 DIAGNOSIS — I2583 Coronary atherosclerosis due to lipid rich plaque: Secondary | ICD-10-CM

## 2023-01-10 DIAGNOSIS — J9601 Acute respiratory failure with hypoxia: Secondary | ICD-10-CM | POA: Diagnosis not present

## 2023-01-10 DIAGNOSIS — I2510 Atherosclerotic heart disease of native coronary artery without angina pectoris: Secondary | ICD-10-CM

## 2023-01-10 DIAGNOSIS — R7989 Other specified abnormal findings of blood chemistry: Secondary | ICD-10-CM | POA: Diagnosis not present

## 2023-01-10 DIAGNOSIS — I25118 Atherosclerotic heart disease of native coronary artery with other forms of angina pectoris: Secondary | ICD-10-CM | POA: Diagnosis not present

## 2023-01-10 LAB — COMPREHENSIVE METABOLIC PANEL
ALT: 28 U/L (ref 0–44)
AST: 31 U/L (ref 15–41)
Albumin: 2.7 g/dL — ABNORMAL LOW (ref 3.5–5.0)
Alkaline Phosphatase: 38 U/L (ref 38–126)
Anion gap: 9 (ref 5–15)
BUN: 30 mg/dL — ABNORMAL HIGH (ref 8–23)
CO2: 20 mmol/L — ABNORMAL LOW (ref 22–32)
Calcium: 8 mg/dL — ABNORMAL LOW (ref 8.9–10.3)
Chloride: 110 mmol/L (ref 98–111)
Creatinine, Ser: 1.59 mg/dL — ABNORMAL HIGH (ref 0.61–1.24)
GFR, Estimated: 44 mL/min — ABNORMAL LOW (ref >60)
Glucose, Bld: 135 mg/dL — ABNORMAL HIGH (ref 70–99)
Potassium: 3.7 mmol/L (ref 3.5–5.1)
Sodium: 139 mmol/L (ref 135–145)
Total Bilirubin: 0.9 mg/dL (ref 0.3–1.2)
Total Protein: 5.7 g/dL — ABNORMAL LOW (ref 6.5–8.1)

## 2023-01-10 LAB — CBC WITH DIFFERENTIAL/PLATELET
Abs Immature Granulocytes: 0.08 10*3/uL — ABNORMAL HIGH (ref 0.00–0.07)
Basophils Absolute: 0 10*3/uL (ref 0.0–0.1)
Basophils Relative: 0 %
Eosinophils Absolute: 0 10*3/uL (ref 0.0–0.5)
Eosinophils Relative: 0 %
HCT: 35.8 % — ABNORMAL LOW (ref 39.0–52.0)
Hemoglobin: 12 g/dL — ABNORMAL LOW (ref 13.0–17.0)
Immature Granulocytes: 1 %
Lymphocytes Relative: 7 %
Lymphs Abs: 0.9 10*3/uL (ref 0.7–4.0)
MCH: 32.2 pg (ref 26.0–34.0)
MCHC: 33.5 g/dL (ref 30.0–36.0)
MCV: 96 fL (ref 80.0–100.0)
Monocytes Absolute: 1.3 10*3/uL — ABNORMAL HIGH (ref 0.1–1.0)
Monocytes Relative: 10 %
Neutro Abs: 10.7 10*3/uL — ABNORMAL HIGH (ref 1.7–7.7)
Neutrophils Relative %: 82 %
Platelets: 144 10*3/uL — ABNORMAL LOW (ref 150–400)
RBC: 3.73 MIL/uL — ABNORMAL LOW (ref 4.22–5.81)
RDW: 14.5 % (ref 11.5–15.5)
Smear Review: ADEQUATE
WBC Morphology: INCREASED
WBC: 13 10*3/uL — ABNORMAL HIGH (ref 4.0–10.5)
nRBC: 0 % (ref 0.0–0.2)

## 2023-01-10 LAB — BLOOD CULTURE ID PANEL (REFLEXED) - BCID2

## 2023-01-10 LAB — BLOOD GAS, ARTERIAL
Acid-base deficit: 5 mmol/L — ABNORMAL HIGH (ref 0.0–2.0)
Acid-base deficit: 5.1 mmol/L — ABNORMAL HIGH (ref 0.0–2.0)
Bicarbonate: 18.6 mmol/L — ABNORMAL LOW (ref 20.0–28.0)
Bicarbonate: 18.8 mmol/L — ABNORMAL LOW (ref 20.0–28.0)
Drawn by: 67510
Drawn by: 67510
O2 Saturation: 99.2 %
O2 Saturation: 94.5 %
Patient temperature: 37.2
Patient temperature: 36
pCO2 arterial: 30 mmHg — ABNORMAL LOW (ref 32–48)
pCO2 arterial: 30 mmHg — ABNORMAL LOW (ref 32–48)
pH, Arterial: 7.4 (ref 7.35–7.45)
pH, Arterial: 7.4 (ref 7.35–7.45)
pO2, Arterial: 115 mmHg — ABNORMAL HIGH (ref 83–108)
pO2, Arterial: 64 mmHg — ABNORMAL LOW (ref 83–108)

## 2023-01-10 LAB — ECHOCARDIOGRAM COMPLETE
AR max vel: 2.85 cm2
AV Area VTI: 2.68 cm2
AV Area mean vel: 2.67 cm2
AV Mean grad: 4 mmHg
AV Peak grad: 7.4 mmHg
Ao pk vel: 1.36 m/s
Area-P 1/2: 2.63 cm2
MV VTI: 3.3 cm2
S' Lateral: 2.8 cm
Weight: 2592 oz

## 2023-01-10 LAB — TROPONIN I (HIGH SENSITIVITY)
Troponin I (High Sensitivity): 838 ng/L (ref <18)
Troponin I (High Sensitivity): 790 ng/L (ref <18)

## 2023-01-10 LAB — FERRITIN: Ferritin: 259 ng/mL (ref 24–336)

## 2023-01-10 LAB — PROTIME-INR
INR: 2.8 — ABNORMAL HIGH (ref 0.8–1.2)
Prothrombin Time: 29.3 seconds — ABNORMAL HIGH (ref 11.4–15.2)

## 2023-01-10 LAB — MAGNESIUM: Magnesium: 2.2 mg/dL (ref 1.7–2.4)

## 2023-01-10 LAB — D-DIMER, QUANTITATIVE: D-Dimer, Quant: 2.06 ug/mL-FEU — ABNORMAL HIGH (ref 0.00–0.50)

## 2023-01-10 LAB — C-REACTIVE PROTEIN: CRP: 19.4 mg/dL — ABNORMAL HIGH (ref <1.0)

## 2023-01-10 LAB — PHOSPHORUS: Phosphorus: 2.9 mg/dL (ref 2.5–4.6)

## 2023-01-10 LAB — LEGIONELLA PNEUMOPHILA SEROGP 1 UR AG: L. pneumophila Serogp 1 Ur Ag: NEGATIVE

## 2023-01-10 MED ORDER — IPRATROPIUM-ALBUTEROL 0.5-2.5 (3) MG/3ML IN SOLN
3.0000 mL | Freq: Four times a day (QID) | RESPIRATORY_TRACT | Status: DC
  Administered 2023-01-10 – 2023-01-12 (×10): 3 mL via RESPIRATORY_TRACT
  Filled 2023-01-10 (×12): qty 3

## 2023-01-10 MED ORDER — ASPIRIN 81 MG PO TBEC
81.0000 mg | DELAYED_RELEASE_TABLET | Freq: Every day | ORAL | Status: DC
  Administered 2023-01-10 – 2023-01-15 (×6): 81 mg via ORAL
  Filled 2023-01-10 (×6): qty 1

## 2023-01-10 MED ORDER — TOCILIZUMAB 400 MG/20ML IV SOLN
600.0000 mg | Freq: Once | INTRAVENOUS | Status: AC
  Administered 2023-01-10: 600 mg via INTRAVENOUS
  Filled 2023-01-10: qty 30

## 2023-01-10 MED ORDER — PANTOPRAZOLE SODIUM 40 MG PO TBEC
40.0000 mg | DELAYED_RELEASE_TABLET | Freq: Two times a day (BID) | ORAL | Status: DC
  Administered 2023-01-10 – 2023-01-15 (×11): 40 mg via ORAL
  Filled 2023-01-10 (×11): qty 1

## 2023-01-10 MED ORDER — DOXYCYCLINE HYCLATE 100 MG PO TABS
100.0000 mg | ORAL_TABLET | Freq: Two times a day (BID) | ORAL | Status: AC
  Administered 2023-01-10 – 2023-01-13 (×8): 100 mg via ORAL
  Filled 2023-01-10 (×8): qty 1

## 2023-01-10 MED ORDER — SODIUM CHLORIDE 0.9 % IV SOLN: 500.0000 mg | INTRAVENOUS | Status: DC

## 2023-01-10 MED ORDER — PERFLUTREN LIPID MICROSPHERE
1.0000 mL | INTRAVENOUS | Status: AC | PRN
  Administered 2023-01-10: 4 mL via INTRAVENOUS

## 2023-01-10 MED ORDER — METHYLPREDNISOLONE SODIUM SUCC 125 MG IJ SOLR
80.0000 mg | Freq: Three times a day (TID) | INTRAMUSCULAR | Status: DC
  Administered 2023-01-10 – 2023-01-13 (×9): 80 mg via INTRAVENOUS
  Filled 2023-01-10 (×9): qty 2

## 2023-01-10 MED ORDER — SODIUM CHLORIDE 0.9 % IV SOLN
2.0000 g | INTRAVENOUS | Status: AC
  Administered 2023-01-10 – 2023-01-13 (×4): 2 g via INTRAVENOUS
  Filled 2023-01-10 (×4): qty 20

## 2023-01-10 MED ORDER — WARFARIN SODIUM 3 MG PO TABS
3.0000 mg | ORAL_TABLET | Freq: Once | ORAL | Status: AC
  Administered 2023-01-10: 3 mg via ORAL
  Filled 2023-01-10 (×2): qty 1

## 2023-01-10 NOTE — Progress Notes (Signed)
Pt found on HFNC 12L per Dr Lendon Colonel too much support at this time. Pt placed back on HFNC and ABG drawn pending results. Pt is stable and tolerating will continue to monitor

## 2023-01-10 NOTE — Consult Note (Signed)
Cardiology Consultation   Patient ID: NOLEN LINDAMOOD MRN: [id number]; DOB: November 30, 1941  Admit date: 01/09/2023 Date of Consult: 01/10/2023  PCP:  [name], MD   [facility] Providers Cardiologist:  Jodelle Red, MD        Patient Profile:   Roberto Dickson is a 82 y.o. 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 Randol Kern.  History of Present Illness:   HPI obtained via translator assistance. ID #[id number].  Roberto Dickson presented to the ED via EMS on 2/6 with symptoms of worsening shortness of breath/productive cough in the setting of myalgias, chills, decreased appetite, malaise. In the ED, patient was found to be febrile up to 102.7. Initial labwork notable for leukocytosis of 12.4, lactic acid 1.6. ABG pH 7.34, pCO2 36, pO2 128 mmHg, bicarbonate 19.3, TCO2 20. Blood culture preliminarily positive for gram positive cocci. Troponin checked and trended to peak: 24, 102, 702, 838, 790. CRP 7.9 ->19.4. D-dimer 2.06. Procalcitonin 3.07. RVP positive for COVID-19. Patient initiated on IV steroids, IV abx, and now Actemra.  Today patient tells me that while he has never quite returned to pre-CVA health, he is able to walk between 20-87min a couple of times per day without shortness of breath, chest pain, palpitations. He endorses compliance with cardiac medications including warfarin. With regards to his heart failure, he denies orthopnea, lower extremity edema. His only symptom with exertion is arthritic lower extremity pain.  Cardiac history notable for LV thrombus found in August/September 2022 while admitted with acute ischemic stroke. TTE also noted wall motion abnormalities but it appears outpatient follow up recommended. Patient had a stress test on 09/06/21 which noted a large size, severe severity mid to apical  inferior mostly fixed perfusion defect. LVEF 29% but felt to be falsely low. A cardiac CT October 2022 showed calcium score 6053 with significant three vessel disease with likely CTO of LAD. Cardiac cath in November 2022 with severe triple vessel CAD (CTO mid LAD, distal LAD diffusely diseased filing with L-L collaterals, moderate caliber diagonal branch severe disease, severe disease Cx and first obtuse marginal, CTO dominant RCA with L-R collaterals). Patient was seen by Dr. Cliffton Asters and elected to proceed with medical management as he was without anginal symptoms or exertional limitation. Repeat echocardiogram 01/13/22 with LVEF 45-50%, RWMA, mild LVH, gr1DD, dilation aortic root 82mm and ascending aorta 74mm. There was swirling artifact at apex suggesting slow flow but previously LV thrombus resolved. Given akinetic apex, recommended to continue anticoagulation.   Past Medical History:  Diagnosis Date   Hypertension     Past Surgical History:  Procedure Laterality Date   APPENDECTOMY     in his 56's   CORONARY ANGIOGRAPHY N/A 10/20/2021   Procedure: CORONARY ANGIOGRAPHY;  Surgeon: Kathleene Hazel, MD;  Location: [facility] INVASIVE CV LAB;  Service: Cardiovascular;  Laterality: N/A;   IR CT HEAD LTD  07/19/2021   IR FLUORO GUIDED NEEDLE PLC ASPIRATION/INJECTION LOC  07/21/2021   IR PERCUTANEOUS ART THROMBECTOMY/INFUSION INTRACRANIAL INC DIAG ANGIO  07/19/2021   RADIOLOGY WITH ANESTHESIA N/A 07/19/2021   Procedure: IR WITH ANESTHESIA;  Surgeon: Julieanne Cotton, MD;  Location: [facility] OR;  Service: Radiology;  Laterality: N/A;     Home Medications:  Prior to Admission medications   Medication Sig Start Date End Date Taking? Authorizing Provider  acetaminophen (TYLENOL) 325 MG  tablet Take 1-2 tablets (325-650 mg total) by mouth every 4 (four) hours as needed for mild pain. Patient taking differently: Take 325 mg by mouth daily as needed for mild pain. 08/10/21  Yes Love, Ivan Anchors, PA-C   amLODipine (NORVASC) 5 MG tablet Take 1 tablet (5 mg total) by mouth daily. 02/27/22  Yes Sagardia, Ines Bloomer, MD  aspirin EC (ASPIRIN LOW DOSE) 81 MG tablet Take 1 tablet (81 mg total) by mouth daily. Swallow whole. 10/19/22  Yes Buford Dresser, MD  atorvastatin (LIPITOR) 80 MG tablet Take 1 tablet (80 mg total) by mouth daily. 02/09/22  Yes Sagardia, Ines Bloomer, MD  docusate sodium (COLACE) 100 MG capsule Take 1 capsule (100 mg total) by mouth 2 (two) times daily. Patient taking differently: Take 200 mg by mouth every other day. 08/10/21  Yes Love, Ivan Anchors, PA-C  levETIRAcetam (KEPPRA) 500 MG tablet Take 1 tablet (500 mg total) by mouth 2 (two) times daily. 10/30/22  Yes Sagardia, Ines Bloomer, MD  melatonin 3 MG TABS tablet Take 0.5 tablets (1.5 mg total) by mouth at bedtime. OVER THE COUNTER Patient taking differently: Take 1.5 mg by mouth at bedtime. 08/10/21  Yes Love, Ivan Anchors, PA-C  metoprolol succinate (TOPROL-XL) 50 MG 24 hr tablet TAKE 1 TABLET(50 MG) BY MOUTH AT BEDTIME Patient taking differently: Take 50 mg by mouth at bedtime. 11/20/22  Yes Buford Dresser, MD  pantoprazole (PROTONIX) 40 MG tablet TAKE 1 TABLET(40 MG) BY MOUTH AT BEDTIME Patient taking differently: Take 40 mg by mouth at bedtime. 08/20/22  Yes Sagardia, Ines Bloomer, MD  tamsulosin (FLOMAX) 0.4 MG CAPS capsule Take one capsule by mouth daily. Patient taking differently: Take 0.4 mg by mouth daily. 10/18/22  Yes Sagardia, Ines Bloomer, MD  tiZANidine (ZANAFLEX) 2 MG tablet TAKE 1 TABLET BY MOUTH AT BEDTIME AS NEEDED FOR MUSCLE SPASMS Patient taking differently: Take 2 mg by mouth at bedtime. 11/18/22  Yes Sagardia, Ines Bloomer, MD  warfarin (COUMADIN) 6 MG tablet TAKE 1 TABLET BY MOUTH DAILY AS DIRECTED BY COAGULATION CLINIC Patient taking differently: Take 3-6 mg by mouth See admin instructions. Take 3 mg by mouth once daily on Monday and Friday. Take 6 mg by mouth once daily on Tuesday, Wednesday, Thursday,  Saturday, and Sunday. 09/19/22  Yes Buford Dresser, MD    Inpatient Medications: Scheduled Meds:  amLODipine  5 mg Oral Daily   vitamin C  500 mg Oral Daily   aspirin EC  81 mg Oral Daily   atorvastatin  80 mg Oral Daily   doxycycline  100 mg Oral Q12H   ipratropium-albuterol  3 mL Nebulization Q6H   levETIRAcetam  500 mg Oral BID   methylPREDNISolone (SOLU-MEDROL) injection  80 mg Intravenous Q8H   metoprolol succinate  50 mg Oral QHS   pantoprazole  40 mg Oral BID   tamsulosin  0.4 mg Oral Daily   warfarin  3 mg Oral ONCE-1600   Warfarin - Pharmacist Dosing Inpatient   Does not apply q1600   zinc sulfate  220 mg Oral Daily   Continuous Infusions:  cefTRIAXone (ROCEPHIN)  IV 2 g (01/10/23 0848)   remdesivir 100 mg in sodium chloride 0.9 % 100 mL IVPB 100 mg (01/10/23 1157)   PRN Meds: acetaminophen **OR** acetaminophen, guaiFENesin-dextromethorphan, ondansetron **OR** ondansetron (ZOFRAN) IV, phenol  Allergies:   No Known Allergies  Social History:   Social History   Socioeconomic History   Marital status: Married    Spouse name: Not on file  Number of children: Not on file   Years of education: Not on file   Highest education level: Not on file  Occupational History   Not on file  Tobacco Use   Smoking status: Never   Smokeless tobacco: Never  Vaping Use   Vaping Use: Never used  Substance and Sexual Activity   Alcohol use: Not Currently    Alcohol/week: 1.0 standard drink of alcohol    Types: 1 Cans of beer per week   Drug use: Never   Sexual activity: Not on file  Other Topics Concern   Not on file  Social History Narrative   Not on file   Social Determinants of Health   Financial Resource Strain: Low Risk  (11/23/2022)   Overall Financial Resource Strain (CARDIA)    Difficulty of Paying Living Expenses: Not hard at all  Food Insecurity: No Food Insecurity (11/23/2022)   Hunger Vital Sign    Worried About Running Out of Food in the Last  Year: Never true    Ran Out of Food in the Last Year: Never true  Transportation Needs: No Transportation Needs (11/23/2022)   PRAPARE - Administrator, Civil Service (Medical): No    Lack of Transportation (Non-Medical): No  Physical Activity: Sufficiently Active (11/23/2022)   Exercise Vital Sign    Days of Exercise per Week: 5 days    Minutes of Exercise per Session: 30 min  Stress: No Stress Concern Present (11/23/2022)   [employer] of Occupational Health - Occupational Stress Questionnaire    Feeling of Stress : Not at all  Social Connections: Socially Integrated (11/23/2022)   Social Connection and Isolation Panel [NHANES]    Frequency of Communication with Friends and Family: More than three times a week    Frequency of Social Gatherings with Friends and Family: More than three times a week    Attends Religious Services: More than 4 times per year    Active Member of [employer] or Organizations: Yes    Attends Engineer, structural: More than 4 times per year    Marital Status: Married  Catering manager Violence: Not At Risk (11/23/2022)   Humiliation, Afraid, Rape, and Kick questionnaire    Fear of Current or Ex-Partner: No    Emotionally Abused: No    Physically Abused: No    Sexually Abused: No    Family History:    Family History  Problem Relation Age of Onset   Cancer - Prostate Father      ROS:  Please see the history of present illness.   All other ROS reviewed and negative.     Physical Exam/Data:   Vitals:   01/10/23 0438 01/10/23 0837 01/10/23 1000 01/10/23 1200  BP:  110/63  102/69  Pulse:  72 75 79  Resp:  18 (!) 27 (!) 27  Temp:  98.5 F (36.9 C)    TempSrc:  Oral    SpO2: 92% 94% 97% (!) 89%  Weight:        Intake/Output Summary (Last 24 hours) at 01/10/2023 1411 Last data filed at 01/10/2023 1478 Gross per 24 hour  Intake 400 ml  Output --  Net 400 ml      01/09/2023   12:00 PM 11/30/2022    3:30 PM 11/23/2022     2:03 PM  Last 3 Weights  Weight (lbs) 162 lb 174 lb 172 lb  Weight (kg) 73.483 kg 78.926 kg 78.019 kg     Body mass  index is 25.37 kg/m.  General:  Well nourished, well developed, in no acute distress but ill appearing on HFNC HEENT: normal Neck: no JVD Vascular: No carotid bruits; Distal pulses 2+ bilaterally Cardiac:  normal S1, S2; RRR; no murmur  Lungs:  Bilateral lower lobes diminished. Left middle and lower lung with rhonchi. Abd: soft, nontender, no hepatomegaly  Ext: no edema Musculoskeletal:  No deformities, BUE and BLE strength normal and equal Skin: warm and dry  Neuro:  CNs 2-12 intact, no focal abnormalities noted Psych:  Normal affect   EKG:  The EKG was personally reviewed and demonstrates:  sinus rhythm. Baseline artifact but tracing generally consistent with old ECGs. Very mild inferior lead ST segment elevation is not new Telemetry:  Telemetry was personally reviewed and demonstrates:  sinus rhythm with frequent PVCs  Relevant CV Studies: 01/13/22 TTE  IMPRESSIONS     1. Left ventricular ejection fraction, by estimation, is 45 to 50%. The  left ventricle has mildly decreased function. The left ventricle  demonstrates regional wall motion abnormalities (see scoring  diagram/findings for description). There is mild left  ventricular hypertrophy. Left ventricular diastolic parameters are  consistent with Grade I diastolic dysfunction (impaired relaxation).   2. Right ventricular systolic function is mildly reduced. The right  ventricular size is normal.   3. The mitral valve is normal in structure. Trivial mitral valve  regurgitation. No evidence of mitral stenosis.   4. The aortic valve is tricuspid. Aortic valve regurgitation is mild to  moderate. Aortic valve sclerosis/calcification is present, without any  evidence of aortic stenosis.   5. Aortic dilatation noted. There is dilatation of the aortic root,  measuring 41 mm. There is dilatation of the  ascending aorta, measuring 43  mm.   Comparison(s): Compared to prior, there is swirling artifact at apex on  contrast images suggesting slow flow but previously seen LV thrombus is  not present. Apex remains akinetic however, would continue anticoagulation  if tolerating.   FINDINGS   Left Ventricle: Left ventricular ejection fraction, by estimation, is 45  to 50%. The left ventricle has mildly decreased function. The left  ventricle demonstrates regional wall motion abnormalities. Definity  contrast agent was given IV to delineate the  left ventricular endocardial borders. The left ventricular internal cavity  size was normal in size. There is mild left ventricular hypertrophy. Left  ventricular diastolic parameters are consistent with Grade I diastolic  dysfunction (impaired relaxation).     LV Wall Scoring:  The apical septal segment, apical inferior segment, and apex are akinetic.  The entire anterior wall, entire lateral wall, anterior septum, inferior  wall, mid inferoseptal segment, and basal inferoseptal segment are normal.   Right Ventricle: The right ventricular size is normal. Right vetricular  wall thickness was not well visualized. Right ventricular systolic  function is mildly reduced.   Left Atrium: Left atrial size was normal in size.   Right Atrium: Right atrial size was normal in size.   Pericardium: There is no evidence of pericardial effusion.   Mitral Valve: The mitral valve is normal in structure. Trivial mitral  valve regurgitation. No evidence of mitral valve stenosis.   Tricuspid Valve: The tricuspid valve is normal in structure. Tricuspid  valve regurgitation is mild.   Aortic Valve: The aortic valve is tricuspid. Aortic valve regurgitation is  mild to moderate. Aortic valve sclerosis/calcification is present, without  any evidence of aortic stenosis. Aortic valve mean gradient measures 4.0  mmHg. Aortic valve peak  gradient measures 6.9 mmHg.  Aortic valve area, by VTI measures 2.54 cm.   Pulmonic Valve: The pulmonic valve was not well visualized. Pulmonic valve  regurgitation is trivial.   Aorta: Aortic dilatation noted. There is dilatation of the aortic root,  measuring 41 mm. There is dilatation of the ascending aorta, measuring 43  mm.   IAS/Shunts: The interatrial septum was not well visualized.    10/20/21 LHC    Mid RCA lesion is 100% stenosed.   Mid Cx to Dist Cx lesion is 90% stenosed.   1st Mrg lesion is 90% stenosed.   Prox Cx to Mid Cx lesion is 80% stenosed.   Mid LAD to Dist LAD lesion is 100% stenosed.   2nd Diag lesion is 90% stenosed.   Ost LAD to Mid LAD lesion is 30% stenosed.   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 The distal LAD is diffusely diseased and fills from left to left collaterals. The moderate caliber diagonal branch has severe disease. Severe disease in the mid and distal AV groove Circumflex. Severe disease in the first obtuse marginal branch Chronic total occlusion of the dominant RCA in the mid segment. The distal vessel fills from left to right collaterals.   Diagnostic Dominance: Right   Laboratory Data:  High Sensitivity Troponin:   Recent Labs  Lab 01/09/23 1010 01/09/23 1317 01/09/23 1929 01/10/23 0847 01/10/23 1200  TROPONINIHS 24* 102* 702* 838* 790*     Chemistry Recent Labs  Lab 01/09/23 1010 01/09/23 1205 01/09/23 1317 01/10/23 0655  NA 139 143  --  139  K 3.6 3.5  --  3.7  CL 111  --   --  110  CO2 17*  --   --  20*  GLUCOSE 115*  --   --  135*  BUN 25*  --   --  30*  CREATININE 1.59*  --   --  1.59*  CALCIUM 8.1*  --   --  8.0*  MG  --   --  1.6* 2.2  GFRNONAA 44*  --   --  44*  ANIONGAP 11  --   --  9    Recent Labs  Lab 01/09/23 1010 01/10/23 0655  PROT 6.9 5.7*  ALBUMIN 3.7 2.7*  AST 34 31  ALT 34 28  ALKPHOS 63 38  BILITOT 1.0 0.9   Lipids No results for input(s): "CHOL", "TRIG", "HDL", "LABVLDL",  "LDLCALC", "CHOLHDL" in the last 168 hours.  Hematology Recent Labs  Lab 01/09/23 1010 01/09/23 1205 01/10/23 0655  WBC 12.4*  --  13.0*  RBC 4.13*  --  3.73*  HGB 13.0 12.6* 12.0*  HCT 39.5 37.0* 35.8*  MCV 95.6  --  96.0  MCH 31.5  --  32.2  MCHC 32.9  --  33.5  RDW 14.5  --  14.5  PLT 159  --  144*   Thyroid No results for input(s): "TSH", "FREET4" in the last 168 hours.  BNP Recent Labs  Lab 01/09/23 1010  BNP 195.7*    DDimer  Recent Labs  Lab 01/10/23 0655  DDIMER 2.06*     Radiology/Studies:  DG Chest Port 1 View  Result Date: 01/09/2023 CLINICAL DATA:  Questionable sepsis - evaluate for abnormality EXAM: PORTABLE CHEST 1 VIEW COMPARISON:  Radiograph 03/01/2022 FINDINGS: Stable cardiac silhouette. Atherosclerotic calcification of the aorta. There is new bibasilar airspace opacity. Upper lungs clear. No pneumothorax. IMPRESSION: Bibasilar airspace opacities concerning for bilateral lower lobe pneumonia.  Electronically Signed   By: Suzy Bouchard M.D.   On: 01/09/2023 11:16   EEG adult        [facility] [place]. [place] [address] [phone]      Electroencephalogram Procedure Note Mr. HADDON FYFE Date of Birth:  07-28-1941 Medical Record Number:  [id number] Indications: Diagnostic Date of Procedure 01/04/2023 Medications: levetiracetam (Keppra) Clinical history : 82 year old patient being evaluated for seizures Technical Description This study was performed using 17 channel digital electroencephalographic recording equipment. International 10-20 electrode placement was used. The record was obtained with the patient awake, drowsy, and asleep.  The record is of fair technical quality for purposes of interpretation. Activation Procedures:  photic stimulation . EEG Description Awake: Alpha Activity: The waking state record contains a well-defined bi-occipital alpha rhythm of  moderate amplitude with a dominant frequency of 10 Hz.  Reactivity is uncertain. No paroxsymal activity, spikes, or sharp waves are noted. Technical component of study is adequate. EKG tracing shows regular sinus rhythm Length of this recording is 24 minutes and 31 seconds Sleep: With drowsiness, there is attenuation of the background alpha activity. As the patient enters into light sleep, vertex waves and symmetrical spindles are noted. K complexes are noted in sleep. Transition to the waking state is unremarkable. Result of Activation Procedures: Hyperventilation: N/A. Photo Stimulation: No photic driving response is noted. Summary Normal electroencephalogram, awake, asleep and with activation procedures. There are no focal lateralizing or epileptiform features.     Assessment and Plan:   Multivessel CAD Elevated troponin Hyperlipidemia  Patient with significant 3 vessel CAD per 2022 LHC. Was seen by [name] but ultimately declined surgical repair due to lack of exertional limitation or anginal symptoms. Troponin elevated this admission in the setting of severe respiratory illness: 24, 102, 702, 838, 790. CRP 7.9 ->19.4. D-dimer 2.06. Procalcitonin 3.07. Patient without chest pain both currently and prior to admission.  Overall low suspicion for ACS. Given acute respiratory illness/COVID-19, would attribute troponin leak to systemic inflammation and demand ischemia/type II MI. No indication for heparin at this time. Additionally, patient would not be a candidate for ischemic evaluation due to acute illness and has previously declined CTS intervention as above. Continue CAD GDMT including: ASA 81mg , Atorvastatin 80mg , Metoprolol Succinate 50mg  QD  HFmrEF  Patient with LVEF 45-50% per February 2023 TTE. BNP elevated to 195.7 this admission, though patient is without symptoms of volume overload.   Patient previously on ARB but recently intolerant of ACEi/ARB due to low BP. Some pressures this admission elevated. No plans to adjust GDMT while  acutely ill with COVID-19 but once recovered, would consider ARB and/or Spironolactone. Follow repeat echocardiogram ordered today by primary team  Hx LV thrombus  Patient with hx LV thrombus found in the setting of ischemic CVA in 2022. Repeat echocardiogram in February 2023 showed absence of thrombus but slow flow.  Continue Warfarin per pharmacy dosing.    Risk Assessment/Risk Scores:                For questions or updates, please contact [place] Please consult [email or web address] for contact info under    Signed, Lily Kocher, PA-C  01/10/2023 2:11 PM

## 2023-01-10 NOTE — Progress Notes (Signed)
ANTICOAGULATION CONSULT NOTE   Pharmacy Consult for warfarin Indication:  h/o CVA and LV thrombus  No Known Allergies  Patient Measurements: Weight: 73.5 kg (162 lb)  Vital Signs: Temp: 98.5 F (36.9 C) (02/07 0837) Temp Source: Oral (02/07 0837) BP: 110/63 (02/07 0837) Pulse Rate: 72 (02/07 0837)  Labs: Recent Labs    01/09/23 1010 01/09/23 1205 01/09/23 1317 01/09/23 1929 01/10/23 0655  HGB 13.0 12.6*  --   --  12.0*  HCT 39.5 37.0*  --   --  35.8*  PLT 159  --   --   --  144*  APTT 43*  --   --   --   --   LABPROT 23.0*  --   --   --  29.3*  INR 2.1*  --   --   --  2.8*  CREATININE 1.59*  --   --   --  1.59*  TROPONINIHS 24*  --  102* 702*  --      Estimated Creatinine Clearance: 34.6 mL/min (A) (by C-G formula based on SCr of 1.59 mg/dL (H)).  Assessment: 82yo male c/o SOB and fever x4d, admitted for Covid, to continue Coumadin for CVA and LV thrombus  Dose PTA - 6 mg daily except 3 mg on Mondays  INR up to 2.8 today  Goal of Therapy:  INR 2-3   Plan:  Warfarin 3 mg po x 1 dose today Daily INR  Thank you Anette Guarneri, PharmD 01/10/2023,9:16 AM

## 2023-01-10 NOTE — Progress Notes (Signed)
PHARMACY - PHYSICIAN COMMUNICATION CRITICAL VALUE ALERT - BLOOD CULTURE IDENTIFICATION (BCID)  Roberto Dickson is an 82 y.o. male who presented to [facility] on 01/09/2023 with a chief complaint of COVID Assessment: Lab called with BCID result>>1/4 bottles with strep species  Name of physician (or Provider) Contacted: Dr Bridgett Larsson  Current antibiotics: Vanc/cefepime  Changes to prescribed antibiotics recommended:  Change abx to ceftriaxone 2g IV q24   Results for orders placed or performed during the hospital encounter of 01/09/23  Blood Culture ID Panel (Reflexed) (Collected: 01/09/2023 10:05 AM)  Result Value Ref Range   Enterococcus faecalis NOT DETECTED NOT DETECTED   Enterococcus Faecium NOT DETECTED NOT DETECTED   Listeria monocytogenes NOT DETECTED NOT DETECTED   Staphylococcus species NOT DETECTED NOT DETECTED   Staphylococcus aureus (BCID) NOT DETECTED NOT DETECTED   Staphylococcus epidermidis NOT DETECTED NOT DETECTED   Staphylococcus lugdunensis NOT DETECTED NOT DETECTED   Streptococcus species DETECTED (A) NOT DETECTED   Streptococcus agalactiae NOT DETECTED NOT DETECTED   Streptococcus pneumoniae NOT DETECTED NOT DETECTED   Streptococcus pyogenes NOT DETECTED NOT DETECTED   A.calcoaceticus-baumannii NOT DETECTED NOT DETECTED   Bacteroides fragilis NOT DETECTED NOT DETECTED   Enterobacterales NOT DETECTED NOT DETECTED   Enterobacter cloacae complex NOT DETECTED NOT DETECTED   Escherichia coli NOT DETECTED NOT DETECTED   Klebsiella aerogenes NOT DETECTED NOT DETECTED   Klebsiella oxytoca NOT DETECTED NOT DETECTED   Klebsiella pneumoniae NOT DETECTED NOT DETECTED   Proteus species NOT DETECTED NOT DETECTED   Salmonella species NOT DETECTED NOT DETECTED   Serratia marcescens NOT DETECTED NOT DETECTED   Haemophilus influenzae NOT DETECTED NOT DETECTED   Neisseria meningitidis NOT DETECTED NOT DETECTED   Pseudomonas aeruginosa NOT DETECTED NOT DETECTED   Stenotrophomonas  maltophilia NOT DETECTED NOT DETECTED   Candida albicans NOT DETECTED NOT DETECTED   Candida auris NOT DETECTED NOT DETECTED   Candida glabrata NOT DETECTED NOT DETECTED   Candida krusei NOT DETECTED NOT DETECTED   Candida parapsilosis NOT DETECTED NOT DETECTED   Candida tropicalis NOT DETECTED NOT DETECTED   Cryptococcus neoformans/gattii NOT DETECTED NOT DETECTED   Onnie Boer, PharmD, BCIDP, AAHIVP, CPP Infectious Disease Pharmacist 01/10/2023 6:38 AM

## 2023-01-10 NOTE — Progress Notes (Signed)
Patient is on 10L HFNC, will continue to monitor if bipap is needed.

## 2023-01-10 NOTE — Progress Notes (Signed)
  X-cover Note: Called for bedside evaluation by RN. Pt on 15 L HFNC. Sats still in the 80-90s. Lungs with coarse rales. No wheezing. No rhonchi. Discussed with RT. Get abg. Start bipap. Repeat abg 2 hours after bipap. Pt already on decadron and remdesivir.   Kristopher Oppenheim, DO [facility]

## 2023-01-10 NOTE — Progress Notes (Signed)
*  PRELIMINARY RESULTS* Echocardiogram 2D Echocardiogram has been performed.  Roberto Dickson 01/10/2023, 4:42 PM

## 2023-01-10 NOTE — Progress Notes (Signed)
PROGRESS NOTE    [name]  ZOX:[id number] DOB: [birth year] DOA: 01/09/2023 PCP: Horald Pollen, MD    Chief Complaint  Patient presents with   Shortness of Breath    Fever   Fever    102.9 oral by EMS, 102.7 in hospital    Brief Narrative:   Roberto Dickson is a 82 y.o. 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  He was found to be febrile in the emergency department. No chest pain, palpitations, diaphoresis, PND, orthopnea or pitting edema of the lower extremities.  No abdominal pain, nausea, emesis, diarrhea, constipation, melena or hematochezia.  No flank pain, dysuria, frequency or hematuria.  No polyuria, polydipsia, polyphagia or blurred vision.    Lab work: His CBCs are white count 12.4, Mono 13.0 g/dL platelets 77.  PT 23.0, INR 2.1 and PTT 43.  Lactic acid is 1.6 mmol/L.  CMP showed a CO2 of 17 mmol/L with a normal anion gap.  Glucose 115, BUN 25, creatinine 1.59 and calcium 8.1 mg/dL.  LFTs and the rest of the electrolytes were normal.  Troponin was 24 ng/L.  An ABG showed a pH of 7.34 with a pCO2 of 36, pO2 of 128 mmHg.  Bicarbonate 19.3, TCO2 20 and acid-base deficit 6.0 mmol/L.  Assessment & Plan:   Principal Problem:   Acute respiratory failure with hypoxia (HCC) Active Problems:   Essential hypertension   Left ventricular thrombus   CAD (coronary artery disease)   Hyperlipidemia   History of seizure   Pneumonia due to COVID-19 virus   Hypomagnesemia   Acute respiratory failure with hypoxia (HCC) Pneumonia due to COVID-19 virus  -Patient with significant respiratory failure, increased oxygen requirement overnight, this morning he is on 12 L  nasal cannula -Inflammatory markers significantly trending up. CRP from 7.9-19.4, as well D-dimers is elevated -Will change IV steroids to IV Decadron. -Given severe respiratory failure, and high risk for decompensation, will give Actemra, have discussed with the patient, no history of diverticulitis, malignancy or immunosuppression -He was encouraged to use incentive spirometry and flutter valve -Calcitonin is elevated, continue with IV antibiotics  Elevated troponins -He denies any chest pain, but troponins keep trending up, 2D echo is pending, will request cardiology input    Essential hypertension Continue amlodipine 5 mg p.o. daily. Continue metoprolol succinate 50 mg p.o. daily. Monitor blood pressure and heart rate.     Left ventricular thrombus Continue warfarin per pharmacy.     CAD (coronary artery disease) Continue atorvastatin, beta-blocker and warfarin.     Hyperlipidemia Continue atorvastatin 80 mg p.o. daily.     History of seizure Continue Keppra 500 mg p.o. twice daily.     DVT prophylaxis: on warfarin Code Status: Full Family Communication: Tele interpreter was used during my interaction, unable to reach wife by phone, D/W daughter by phone  Disposition:   Status is: Inpatient    Consultants:  Cardiology  Subjective:  Does report cough, shortness of breath, and fatigue  Objective: Vitals:   01/10/23 0438 01/10/23 0837 01/10/23 1000 01/10/23 1200  BP:  110/63  102/69  Pulse:  72 75 79  Resp:  18 (!) 27 (!) 27  Temp:  98.5 F (36.9 C)    TempSrc:  Oral  SpO2: 92% 94% 97% (!) 89%  Weight:        Intake/Output Summary (Last 24 hours) at 01/10/2023 1251 Last data filed at 01/10/2023 [id number] Gross per 24 hour  Intake 400 ml  Output --  Net 400 ml   Filed Weights   01/09/23 1200  Weight: 73.5 kg    Examination:  Awake Alert, Oriented X 3, frail, deconditioned Symmetrical Chest wall movement, scattered rales RRR,No Gallops,Rubs or new  Murmurs, No Parasternal Heave +ve B.Sounds, Abd Soft, No tenderness, No rebound - guarding or rigidity. No Cyanosis, Clubbing or edema, No new Rash or bruise      Data Reviewed: I have personally reviewed following labs and imaging studies  CBC: Recent Labs  Lab 01/09/23 1010 01/09/23 1205 01/10/23 0655  WBC 12.4*  --  13.0*  NEUTROABS 9.5*  --  10.7*  HGB 13.0 12.6* 12.0*  HCT 39.5 37.0* 35.8*  MCV 95.6  --  96.0  PLT 159  --  144*    Basic Metabolic Panel: Recent Labs  Lab 01/09/23 1010 01/09/23 1205 01/09/23 1317 01/10/23 0655  NA 139 143  --  139  K 3.6 3.5  --  3.7  CL 111  --   --  110  CO2 17*  --   --  20*  GLUCOSE 115*  --   --  135*  BUN 25*  --   --  30*  CREATININE 1.59*  --   --  1.59*  CALCIUM 8.1*  --   --  8.0*  MG  --   --  1.6* 2.2  PHOS  --   --  2.9 2.9    GFR: Estimated Creatinine Clearance: 34.6 mL/min (A) (by C-G formula based on SCr of 1.59 mg/dL (H)).  Liver Function Tests: Recent Labs  Lab 01/09/23 1010 01/10/23 0655  AST 34 31  ALT 34 28  ALKPHOS 63 38  BILITOT 1.0 0.9  PROT 6.9 5.7*  ALBUMIN 3.7 2.7*    CBG: No results for input(s): "GLUCAP" in the last 168 hours.   Recent Results (from the past 240 hour(s))  Blood Culture (routine x 2)     Status: None (Preliminary result)   Collection Time: 01/09/23 10:05 AM   Specimen: BLOOD  Result Value Ref Range Status   Specimen Description BLOOD SITE NOT SPECIFIED  Final   Special Requests   Final    BOTTLES DRAWN AEROBIC AND ANAEROBIC Blood Culture adequate volume   Culture  Setup Time   Final    GRAM POSITIVE COCCI IN BOTH AEROBIC AND ANAEROBIC BOTTLES CRITICAL RESULT CALLED TO, READ BACK BY AND VERIFIED WITH: Jerilynn Mages PHAM,[email or web address]  01/10/23 [place] Performed at [facility], [address] [place], [place] [address]    Culture GRAM POSITIVE COCCI  Final   Report Status PENDING  Incomplete  Blood Culture ID Panel (Reflexed)     Status: Abnormal   Collection Time:  01/09/23 10:05 AM  Result Value Ref Range Status   Enterococcus faecalis NOT DETECTED NOT DETECTED Final   Enterococcus Faecium NOT DETECTED NOT DETECTED Final   Listeria monocytogenes NOT DETECTED NOT DETECTED Final   Staphylococcus species NOT DETECTED NOT DETECTED Final   Staphylococcus aureus (BCID) NOT DETECTED NOT DETECTED Final   Staphylococcus epidermidis NOT DETECTED NOT DETECTED Final   Staphylococcus lugdunensis NOT DETECTED NOT DETECTED Final   Streptococcus species DETECTED (A) NOT DETECTED Final    Comment: Not Enterococcus species, Streptococcus agalactiae, Streptococcus pyogenes, or Streptococcus  pneumoniae. CRITICAL RESULT CALLED TO, READ BACK BY AND VERIFIED WITH: M PHAM,[email or web address]  01/10/23 [place]    Streptococcus agalactiae NOT DETECTED NOT DETECTED Final   Streptococcus pneumoniae NOT DETECTED NOT DETECTED Final   Streptococcus pyogenes NOT DETECTED NOT DETECTED Final   A.calcoaceticus-baumannii NOT DETECTED NOT DETECTED Final   Bacteroides fragilis NOT DETECTED NOT DETECTED Final   Enterobacterales NOT DETECTED NOT DETECTED Final   Enterobacter cloacae complex NOT DETECTED NOT DETECTED Final   Escherichia coli NOT DETECTED NOT DETECTED Final   Klebsiella aerogenes NOT DETECTED NOT DETECTED Final   Klebsiella oxytoca NOT DETECTED NOT DETECTED Final   Klebsiella pneumoniae NOT DETECTED NOT DETECTED Final   Proteus species NOT DETECTED NOT DETECTED Final   Salmonella species NOT DETECTED NOT DETECTED Final   Serratia marcescens NOT DETECTED NOT DETECTED Final   Haemophilus influenzae NOT DETECTED NOT DETECTED Final   Neisseria meningitidis NOT DETECTED NOT DETECTED Final   Pseudomonas aeruginosa NOT DETECTED NOT DETECTED Final   Stenotrophomonas maltophilia NOT DETECTED NOT DETECTED Final   Candida albicans NOT DETECTED NOT DETECTED Final   Candida auris NOT DETECTED NOT DETECTED Final   Candida glabrata NOT DETECTED NOT DETECTED Final   Candida krusei NOT DETECTED  NOT DETECTED Final   Candida parapsilosis NOT DETECTED NOT DETECTED Final   Candida tropicalis NOT DETECTED NOT DETECTED Final   Cryptococcus neoformans/gattii NOT DETECTED NOT DETECTED Final    Comment: Performed at [facility], [address], [place], [place] [address]  Blood Culture (routine x 2)     Status: None (Preliminary result)   Collection Time: 01/09/23 10:15 AM   Specimen: BLOOD  Result Value Ref Range Status   Specimen Description BLOOD SITE NOT SPECIFIED  Final   Special Requests   Final    BOTTLES DRAWN AEROBIC AND ANAEROBIC Blood Culture adequate volume   Culture   Final    NO GROWTH < 24 HOURS Performed at [facility], [address], [place], [place] [address]    Report Status PENDING  Incomplete  Resp panel by RT-PCR (RSV, Flu A&B, Covid) Anterior Nasal Swab     Status: Abnormal   Collection Time: 01/09/23 10:16 AM   Specimen: Anterior Nasal Swab  Result Value Ref Range Status   SARS Coronavirus 2 by RT PCR POSITIVE (A) NEGATIVE Final   Influenza A by PCR NEGATIVE NEGATIVE Final   Influenza B by PCR NEGATIVE NEGATIVE Final    Comment: (NOTE) The Xpert Xpress SARS-CoV-2/FLU/RSV plus assay is intended as an aid in the diagnosis of influenza from Nasopharyngeal swab specimens and should not be used as a sole basis for treatment. Nasal washings and aspirates are unacceptable for Xpert Xpress SARS-CoV-2/FLU/RSV testing.  Fact Sheet for Patients: EntrepreneurPulse.com.au  Fact Sheet for Healthcare Providers: IncredibleEmployment.be  This test is not yet approved or cleared by the [place] FDA and has been authorized for detection and/or diagnosis of SARS-CoV-2 by FDA under an Emergency Use Authorization (EUA). This EUA will remain in effect (meaning this test can be used) for the duration of the COVID-19 declaration under Section 564(b)(1) of the Act, 21 U.S.C. section 360bbb-3(b)(1), unless the  authorization is terminated or revoked.     Resp Syncytial Virus by PCR NEGATIVE NEGATIVE Final    Comment: (NOTE) Fact Sheet for Patients: EntrepreneurPulse.com.au  Fact Sheet for Healthcare Providers: IncredibleEmployment.be  This test is not yet approved or cleared by the [place] FDA and has been authorized for detection and/or diagnosis  of SARS-CoV-2 by FDA under an Emergency Use Authorization (EUA). This EUA will remain in effect (meaning this test can be used) for the duration of the COVID-19 declaration under Section 564(b)(1) of the Act, 21 U.S.C. section 360bbb-3(b)(1), unless the authorization is terminated or revoked.  Performed at [facility], [address], [place], [place] [address]   Expectorated Sputum Assessment w Gram Stain, Rflx to Resp Cult     Status: None   Collection Time: 01/09/23 10:41 AM   Specimen: Expectorated Sputum  Result Value Ref Range Status   Specimen Description EXPECTORATED SPUTUM  Final   Special Requests NONE  Final   Sputum evaluation   Final    THIS SPECIMEN IS ACCEPTABLE FOR SPUTUM CULTURE Performed at [facility], [address], [place], [place] [address]    Report Status 01/09/2023 FINAL  Final  Culture, Respiratory w Gram Stain     Status: None (Preliminary result)   Collection Time: 01/09/23 10:41 AM  Result Value Ref Range Status   Specimen Description EXPECTORATED SPUTUM  Final   Special Requests NONE Reflexed from [id number]  Final   Gram Stain   Final    ABUNDANT WBC PRESENT, PREDOMINANTLY PMN FEW GRAM NEGATIVE RODS FEW GRAM POSITIVE COCCI IN PAIRS    Culture   Final    CULTURE REINCUBATED FOR BETTER GROWTH Performed at [facility], [address] [place], [place] [address]    Report Status PENDING  Incomplete         Radiology Studies: DG Chest Port 1 View  Result Date: 01/09/2023 CLINICAL DATA:  Questionable sepsis - evaluate for abnormality EXAM:  PORTABLE CHEST 1 VIEW COMPARISON:  Radiograph 03/01/2022 FINDINGS: Stable cardiac silhouette. Atherosclerotic calcification of the aorta. There is new bibasilar airspace opacity. Upper lungs clear. No pneumothorax. IMPRESSION: Bibasilar airspace opacities concerning for bilateral lower lobe pneumonia. Electronically Signed   By: Genevive Bi M.D.   On: 01/09/2023 11:16   EEG adult        [facility] [address] [place]. [place] [address] [phone]      Electroencephalogram Procedure Note Mr. SHAMARCUS HOHEISEL Date of Birth:  1941/06/15 Medical Record Number:  [id number] Indications: Diagnostic Date of Procedure 01/04/2023 Medications: levetiracetam (Keppra) Clinical history : 82 year old patient being evaluated for seizures Technical Description This study was performed using 17 channel digital electroencephalographic recording equipment. International 10-20 electrode placement was used. The record was obtained with the patient awake, drowsy, and asleep.  The record is of fair technical quality for purposes of interpretation. Activation Procedures:  photic stimulation . EEG Description Awake: Alpha Activity: The waking state record contains a well-defined bi-occipital alpha rhythm of  moderate amplitude with a dominant frequency of 10 Hz. Reactivity is uncertain. No paroxsymal activity, spikes, or sharp waves are noted. Technical component of study is adequate. EKG tracing shows regular sinus rhythm Length of this recording is 24 minutes and 31 seconds Sleep: With drowsiness, there is attenuation of the background alpha activity. As the patient enters into light sleep, vertex waves and symmetrical spindles are noted. K complexes are noted in sleep. Transition to the waking state is unremarkable. Result of Activation Procedures: Hyperventilation: N/A. Photo Stimulation: No photic driving response is noted. Summary Normal electroencephalogram, awake, asleep and with activation procedures.  There are no focal lateralizing or epileptiform features.        Scheduled Meds:  amLODipine  5 mg Oral Daily   vitamin C  500  mg Oral Daily   atorvastatin  80 mg Oral Daily   ipratropium-albuterol  3 mL Nebulization Q6H   levETIRAcetam  500 mg Oral BID   methylPREDNISolone (SOLU-MEDROL) injection  80 mg Intravenous Q8H   metoprolol succinate  50 mg Oral QHS   pantoprazole  40 mg Oral BID   tamsulosin  0.4 mg Oral Daily   warfarin  3 mg Oral ONCE-1600   Warfarin - Pharmacist Dosing Inpatient   Does not apply q1600   zinc sulfate  220 mg Oral Daily   Continuous Infusions:  cefTRIAXone (ROCEPHIN)  IV 2 g (01/10/23 0848)   remdesivir 100 mg in sodium chloride 0.9 % 100 mL IVPB 100 mg (01/10/23 1157)     LOS: 1 day       Phillips Climes, MD [facility]   To contact the attending provider between 7A-7P or the covering provider during after hours 7P-7A, please log into the web site [email or web address] and access using universal [place] password for that web site. If you do not have the password, please call the hospital operator.  01/10/2023, 12:51 PM

## 2023-01-11 DIAGNOSIS — I513 Intracardiac thrombosis, not elsewhere classified: Secondary | ICD-10-CM | POA: Diagnosis not present

## 2023-01-11 DIAGNOSIS — J9601 Acute respiratory failure with hypoxia: Secondary | ICD-10-CM | POA: Diagnosis not present

## 2023-01-11 DIAGNOSIS — Z87898 Personal history of other specified conditions: Secondary | ICD-10-CM

## 2023-01-11 DIAGNOSIS — I10 Essential (primary) hypertension: Secondary | ICD-10-CM | POA: Diagnosis not present

## 2023-01-11 LAB — CBC WITH DIFFERENTIAL/PLATELET
Abs Immature Granulocytes: 0.05 10*3/uL (ref 0.00–0.07)
Basophils Absolute: 0 10*3/uL (ref 0.0–0.1)
Basophils Relative: 0 %
Eosinophils Absolute: 0 10*3/uL (ref 0.0–0.5)
Eosinophils Relative: 0 %
HCT: 34.6 % — ABNORMAL LOW (ref 39.0–52.0)
Hemoglobin: 11.4 g/dL — ABNORMAL LOW (ref 13.0–17.0)
Immature Granulocytes: 1 %
Lymphocytes Relative: 2 %
Lymphs Abs: 0.3 10*3/uL — ABNORMAL LOW (ref 0.7–4.0)
MCH: 31.2 pg (ref 26.0–34.0)
MCHC: 32.9 g/dL (ref 30.0–36.0)
MCV: 94.8 fL (ref 80.0–100.0)
Monocytes Absolute: 0.8 10*3/uL (ref 0.1–1.0)
Monocytes Relative: 8 %
Neutro Abs: 9.7 10*3/uL — ABNORMAL HIGH (ref 1.7–7.7)
Neutrophils Relative %: 89 %
Platelets: 154 10*3/uL (ref 150–400)
RBC: 3.65 MIL/uL — ABNORMAL LOW (ref 4.22–5.81)
RDW: 14.4 % (ref 11.5–15.5)
WBC: 10.8 10*3/uL — ABNORMAL HIGH (ref 4.0–10.5)
nRBC: 0 % (ref 0.0–0.2)

## 2023-01-11 LAB — COMPREHENSIVE METABOLIC PANEL
ALT: 25 U/L (ref 0–44)
AST: 35 U/L (ref 15–41)
Albumin: 2.6 g/dL — ABNORMAL LOW (ref 3.5–5.0)
Alkaline Phosphatase: 40 U/L (ref 38–126)
Anion gap: 10 (ref 5–15)
BUN: 47 mg/dL — ABNORMAL HIGH (ref 8–23)
CO2: 18 mmol/L — ABNORMAL LOW (ref 22–32)
Calcium: 7.6 mg/dL — ABNORMAL LOW (ref 8.9–10.3)
Chloride: 107 mmol/L (ref 98–111)
Creatinine, Ser: 2.07 mg/dL — ABNORMAL HIGH (ref 0.61–1.24)
GFR, Estimated: 32 mL/min — ABNORMAL LOW (ref >60)
Glucose, Bld: 150 mg/dL — ABNORMAL HIGH (ref 70–99)
Potassium: 3.7 mmol/L (ref 3.5–5.1)
Sodium: 135 mmol/L (ref 135–145)
Total Bilirubin: 0.4 mg/dL (ref 0.3–1.2)
Total Protein: 5.6 g/dL — ABNORMAL LOW (ref 6.5–8.1)

## 2023-01-11 LAB — PROTIME-INR
INR: 4.7 (ref 0.8–1.2)
Prothrombin Time: 43.7 seconds — ABNORMAL HIGH (ref 11.4–15.2)

## 2023-01-11 LAB — CULTURE, RESPIRATORY W GRAM STAIN: Culture: NORMAL

## 2023-01-11 LAB — FERRITIN: Ferritin: 306 ng/mL (ref 24–336)

## 2023-01-11 LAB — MAGNESIUM: Magnesium: 2.2 mg/dL (ref 1.7–2.4)

## 2023-01-11 LAB — C-REACTIVE PROTEIN: CRP: 17.5 mg/dL — ABNORMAL HIGH (ref <1.0)

## 2023-01-11 LAB — PHOSPHORUS: Phosphorus: 2.5 mg/dL (ref 2.5–4.6)

## 2023-01-11 LAB — D-DIMER, QUANTITATIVE: D-Dimer, Quant: 1.8 ug/mL-FEU — ABNORMAL HIGH (ref 0.00–0.50)

## 2023-01-11 MED ORDER — POTASSIUM CHLORIDE CRYS ER 20 MEQ PO TBCR
40.0000 meq | EXTENDED_RELEASE_TABLET | Freq: Once | ORAL | Status: AC
  Administered 2023-01-11: 40 meq via ORAL
  Filled 2023-01-11: qty 2

## 2023-01-11 NOTE — Care Management (Signed)
  Transition of Care [facility]) Screening Note   Patient Details  Name: Roberto Dickson Date of Birth: 08-10-1941   Transition of Care [facility]) CM/SW Contact:    Levonne Lapping, RN Phone Number: 01/11/2023, 9:33 AM    Transition of Care Department [facility]) has reviewed patient chart. We will continue to monitor patient advancement through interdisciplinary progression rounds. If new patient transition needs arise, please place a TOC consult.

## 2023-01-11 NOTE — Evaluation (Signed)
Physical Therapy Evaluation Patient Details Name: Roberto Dickson MRN: [id number] DOB: 26-Feb-1941 Today's Date: 01/11/2023  History of Present Illness  Pt is a 82 y.o. male who presented to the emergency department with progressively worse dyspnea associated with productive cough, sore throat, rhinorrhea, chills, decreased appetite, fatigue and malaise for the past 4 days. In ED pt positive for COVID-19 and admitted for PNA. 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   Clinical Impression  Roberto Dickson is 82 y.o. male admitted with above HPI and diagnosis. Patient is currently limited by functional impairments below (see PT problem list). Patient lives with his family and is independent at baseline. He has progressed with his mobility since the CVA he has in 2022 and no longer uses an AD for gait and is not longer receiving PT/OT. Currently he requires min guard for transfers and min assist for gait with RW. No overt LOB noted but assist to negotiate obstacles in room. Pt on 8L/min throughout and SpO2 maintained at 90% with mobility. Patient will benefit from continued skilled PT interventions to address impairments and progress independence with mobility, recommending OPPT follow up for Rt UE coordination and general balance training, pt may decline. Acute PT will follow and progress as able.        Recommendations for follow up therapy are one component of a multi-disciplinary discharge planning process, led by the attending physician.  Recommendations may be updated based on patient status, additional functional criteria and insurance authorization.  Follow Up Recommendations Outpatient PT (pt completed HHPT and never transitioned to OPPT, may not want to)      Assistance Recommended at Discharge Set up Supervision/Assistance  Patient can return home with the following  A little help with walking and/or  transfers;A little help with bathing/dressing/bathroom;Assistance with cooking/housework;Assistance with feeding;Assist for transportation;Help with stairs or ramp for entrance    Equipment Recommendations None recommended by PT  Recommendations for Other Services       Functional Status Assessment Patient has had a recent decline in their functional status and demonstrates the ability to make significant improvements in function in a reasonable and predictable amount of time.     Precautions / Restrictions Precautions Precautions: Fall Precaution Comments: watch O2 Restrictions Weight Bearing Restrictions: No      Mobility  Bed Mobility               General bed mobility comments: OOB to recliner at start of session    Transfers Overall transfer level: Needs assistance Equipment used: Rolling walker (2 wheels) Transfers: Sit to/from Stand Sit to Stand: Min guard           General transfer comment: pt using bil UE for power up, guard for safety. cues in bathroom to use grab bar for power up.    Ambulation/Gait Ambulation/Gait assistance: Min assist Gait Distance (Feet): 20 Feet (2x20) Assistive device: Rolling walker (2 wheels) Gait Pattern/deviations: Step-through pattern, Decreased stride length, Trunk flexed Gait velocity: decr     General Gait Details: min assist to maintain walker position with transition through bathroom door and around obstacles in room like bed. no LOB noted, trunk flexed throughout. SpO2 90% on 8L/min with ambulation.  Stairs            Wheelchair Mobility    Modified Rankin (Stroke Patients Only)       Balance Overall balance assessment: Needs assistance Sitting-balance support: Feet supported  Sitting balance-Leahy Scale: Good     Standing balance support: Reliant on assistive device for balance, During functional activity, Bilateral upper extremity supported Standing balance-Leahy Scale: Poor                                Pertinent Vitals/Pain Pain Assessment Pain Assessment: No/denies pain    Home Living Family/patient expects to be discharged to:: Private residence Living Arrangements: Spouse/significant other (daughter is available as needed for assistance) Available Help at Discharge: Family Type of Home: Apartment Home Access: Elevator       Home Layout: One level Home Equipment: Advice worker (2 wheels);Cane - single point      Prior Function Prior Level of Function : Independent/Modified Independent             Mobility Comments: reports independence with mobility, no use of AD ADLs Comments: reports his wife assists him with bathing now and does everything around the home.     Hand Dominance        Extremity/Trunk Assessment   Upper Extremity Assessment Upper Extremity Assessment: Overall WFL for tasks assessed    Lower Extremity Assessment Lower Extremity Assessment: Overall WFL for tasks assessed    Cervical / Trunk Assessment Cervical / Trunk Assessment: Normal  Communication   Communication: Prefers language other than English  Cognition Arousal/Alertness: Awake/alert Behavior During Therapy: WFL for tasks assessed/performed Overall Cognitive Status: Within Functional Limits for tasks assessed                                          General Comments      Exercises     Assessment/Plan    PT Assessment Patient needs continued PT services  PT Problem List Decreased strength;Decreased activity tolerance;Decreased balance;Decreased mobility;Decreased knowledge of use of DME;Cardiopulmonary status limiting activity       PT Treatment Interventions DME instruction;Balance training;Therapeutic exercise;Therapeutic activities;Functional mobility training;Stair training;Gait training;Patient/family education    PT Goals (Current goals can be found in the Care Plan section)  Acute Rehab PT Goals Patient Stated  Goal: return home and regain strength/endurance PT Goal Formulation: With patient Time For Goal Achievement: 01/25/23 Potential to Achieve Goals: Good    Frequency Min 3X/week     Co-evaluation               AM-PAC PT "6 Clicks" Mobility  Outcome Measure Help needed turning from your back to your side while in a flat bed without using bedrails?: A Little Help needed moving from lying on your back to sitting on the side of a flat bed without using bedrails?: A Little Help needed moving to and from a bed to a chair (including a wheelchair)?: A Little Help needed standing up from a chair using your arms (e.g., wheelchair or bedside chair)?: A Little Help needed to walk in hospital room?: A Little Help needed climbing 3-5 steps with a railing? : A Lot 6 Click Score: 17    End of Session Equipment Utilized During Treatment: Gait belt;Oxygen Activity Tolerance: Patient tolerated treatment well Patient left: in chair;with call bell/phone within reach Nurse Communication: Mobility status PT Visit Diagnosis: Muscle weakness (generalized) (M62.81);Difficulty in walking, not elsewhere classified (R26.2);Unsteadiness on feet (R26.81)    Time: 7416-3845 PT Time Calculation (min) (ACUTE ONLY): 33 min   Charges:   PT  Evaluation $PT Eval Moderate Complexity: 1 Mod PT Treatments $Gait Training: 8-22 mins        Verner Mould, DPT Acute Rehabilitation Services Office [phone]  01/11/23 12:57 PM

## 2023-01-11 NOTE — Progress Notes (Signed)
PROGRESS NOTE    Roberto Dickson  ZOX:[id number] DOB: [birth year] DOA: 01/09/2023 PCP: Horald Pollen, MD    Chief Complaint  Patient presents with   Shortness of Breath    Fever   Fever    102.9 oral by EMS, 102.7 in hospital    Brief Narrative:   Roberto Dickson is a 82 y.o. 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  His workup significant for COVID-19 pneumonia, with significant hypoxia, admitted for further workup.   Assessment & Plan:   Principal Problem:   Acute respiratory failure with hypoxia (HCC) Active Problems:   Essential hypertension   Left ventricular thrombus   CAD (coronary artery disease)   Hyperlipidemia   History of seizure   Pneumonia due to COVID-19 virus   Hypomagnesemia   Acute respiratory failure with hypoxia (HCC) Pneumonia due to COVID-19 virus  -Patient with significant respiratory failure, severe COVID-19 infection, significantly elevated inflammatory markers . - significant oxygen requirement.  Down to 10 L via HFNC today -Continue to monitor inflammatory markers closely, remains significantly elevated including CRP, and D-dimers . -Given severe COVID infection, he received Actemra 2/7 . -Continue with IV remdesivir . -Sinew with high-dose IV steroids, continue with Solu-Medrol.  . -Elevated procalcitonin, continue with IV doxycycline and Rocephin . -Patient was encouraged to use incentive spirometer and flutter valve .  Elevated troponins - cardiology  input greatly appreciated, likely due to demand ischemia. -Continue with aspirin    Essential hypertension Continue amlodipine 5 mg p.o. daily. Continue metoprolol  succinate 50 mg p.o. daily. Monitor blood pressure and heart rate.     Left ventricular thrombus Continue warfarin per pharmacy. INR supratherapeutic at 4.7 today,     CAD (coronary artery disease) Continue atorvastatin, beta-blocker and warfarin.     Hyperlipidemia Continue atorvastatin 80 mg p.o. daily.     History of seizure Continue Keppra 500 mg p.o. twice daily.     DVT prophylaxis: on warfarin Code Status: Full Family Communication: D/W daughter by phone. Disposition:   Status is: Inpatient    Consultants:  Cardiology  Subjective:  Complaining of cough, shortness of breath, and fatigue.  Objective: Vitals:   01/10/23 2300 01/11/23 0831 01/11/23 0851 01/11/23 0937  BP: 125/75 114/73    Pulse: 80     Resp: (!) 86     Temp: 98.5 F (36.9 C) 98.4 F (36.9 C)    TempSrc: Oral Oral    SpO2: 92% 96% 94% 92%  Weight:        Intake/Output Summary (Last 24 hours) at 01/11/2023 1119 Last data filed at 01/11/2023 0407 Gross per 24 hour  Intake 200 ml  Output 450 ml  Net -250 ml   Filed Weights   01/09/23 1200  Weight: 73.5 kg    Examination:  Awake Alert, Oriented X 3, frail, deconditioned  Symmetrical Chest wall movement, scattered Rales bilaterally with diminished air entry at the bases. RRR,No Gallops,Rubs or new Murmurs, No Parasternal Heave +ve B.Sounds, Abd Soft, No tenderness, No rebound - guarding or rigidity. No Cyanosis, Clubbing or edema, No new Rash or bruise      Data Reviewed: I have personally reviewed following labs and imaging studies  CBC: Recent Labs  Lab 01/09/23 1010  01/09/23 1205 01/10/23 0655 01/11/23 0252  WBC 12.4*  --  13.0* 10.8*  NEUTROABS 9.5*  --  10.7* 9.7*  HGB 13.0 12.6* 12.0* 11.4*  HCT 39.5 37.0* 35.8* 34.6*  MCV 95.6  --  96.0 94.8  PLT 159  --  144* 154    Basic Metabolic Panel: Recent Labs  Lab 01/09/23 1010 01/09/23 1205 01/09/23 1317 01/10/23 0655 01/11/23 0252  NA 139 143  --  139 135  K 3.6  3.5  --  3.7 3.7  CL 111  --   --  110 107  CO2 17*  --   --  20* 18*  GLUCOSE 115*  --   --  135* 150*  BUN 25*  --   --  30* 47*  CREATININE 1.59*  --   --  1.59* 2.07*  CALCIUM 8.1*  --   --  8.0* 7.6*  MG  --   --  1.6* 2.2 2.2  PHOS  --   --  2.9 2.9 2.5    GFR: Estimated Creatinine Clearance: 26.6 mL/min (A) (by C-G formula based on SCr of 2.07 mg/dL (H)).  Liver Function Tests: Recent Labs  Lab 01/09/23 1010 01/10/23 0655 01/11/23 0252  AST 34 31 35  ALT 34 28 25  ALKPHOS 63 38 40  BILITOT 1.0 0.9 0.4  PROT 6.9 5.7* 5.6*  ALBUMIN 3.7 2.7* 2.6*    CBG: No results for input(s): "GLUCAP" in the last 168 hours.   Recent Results (from the past 240 hour(s))  Blood Culture (routine x 2)     Status: Abnormal (Preliminary result)   Collection Time: 01/09/23 10:05 AM   Specimen: BLOOD  Result Value Ref Range Status   Specimen Description BLOOD SITE NOT SPECIFIED  Final   Special Requests   Final    BOTTLES DRAWN AEROBIC AND ANAEROBIC Blood Culture adequate volume   Culture  Setup Time   Final    GRAM POSITIVE COCCI IN BOTH AEROBIC AND ANAEROBIC BOTTLES CRITICAL RESULT CALLED TO, READ BACK BY AND VERIFIED WITH: M PHAM,PHARMD@0615  01/10/23 MK    Culture (A)  Final    STREPTOCOCCUS PARASANGUINIS STAPHYLOCOCCUS EPIDERMIDIS THE SIGNIFICANCE OF ISOLATING THIS ORGANISM FROM A SINGLE SET OF BLOOD CULTURES WHEN MULTIPLE SETS ARE DRAWN IS UNCERTAIN. PLEASE NOTIFY THE MICROBIOLOGY DEPARTMENT WITHIN ONE WEEK IF SPECIATION AND SENSITIVITIES ARE REQUIRED. Performed at [facility], [address] [place], [place] [address]    Report Status PENDING  Incomplete  Blood Culture ID Panel (Reflexed)     Status: Abnormal   Collection Time: 01/09/23 10:05 AM  Result Value Ref Range Status   Enterococcus faecalis NOT DETECTED NOT DETECTED Final   Enterococcus Faecium NOT DETECTED NOT DETECTED Final   Listeria monocytogenes NOT DETECTED NOT DETECTED Final   Staphylococcus  species NOT DETECTED NOT DETECTED Final   Staphylococcus aureus (BCID) NOT DETECTED NOT DETECTED Final   Staphylococcus epidermidis NOT DETECTED NOT DETECTED Final   Staphylococcus lugdunensis NOT DETECTED NOT DETECTED Final   Streptococcus species DETECTED (A) NOT DETECTED Final    Comment: Not Enterococcus species, Streptococcus agalactiae, Streptococcus pyogenes, or Streptococcus pneumoniae. CRITICAL RESULT CALLED TO, READ BACK BY AND VERIFIED WITH: M PHAM,PHARMD@0615  01/10/23 MK    Streptococcus agalactiae NOT DETECTED NOT DETECTED Final   Streptococcus pneumoniae NOT DETECTED NOT DETECTED Final   Streptococcus pyogenes NOT DETECTED NOT DETECTED Final   A.calcoaceticus-baumannii NOT DETECTED NOT DETECTED Final   Bacteroides fragilis NOT DETECTED NOT DETECTED Final  Enterobacterales NOT DETECTED NOT DETECTED Final   Enterobacter cloacae complex NOT DETECTED NOT DETECTED Final   Escherichia coli NOT DETECTED NOT DETECTED Final   Klebsiella aerogenes NOT DETECTED NOT DETECTED Final   Klebsiella oxytoca NOT DETECTED NOT DETECTED Final   Klebsiella pneumoniae NOT DETECTED NOT DETECTED Final   Proteus species NOT DETECTED NOT DETECTED Final   Salmonella species NOT DETECTED NOT DETECTED Final   Serratia marcescens NOT DETECTED NOT DETECTED Final   Haemophilus influenzae NOT DETECTED NOT DETECTED Final   Neisseria meningitidis NOT DETECTED NOT DETECTED Final   Pseudomonas aeruginosa NOT DETECTED NOT DETECTED Final   Stenotrophomonas maltophilia NOT DETECTED NOT DETECTED Final   Candida albicans NOT DETECTED NOT DETECTED Final   Candida auris NOT DETECTED NOT DETECTED Final   Candida glabrata NOT DETECTED NOT DETECTED Final   Candida krusei NOT DETECTED NOT DETECTED Final   Candida parapsilosis NOT DETECTED NOT DETECTED Final   Candida tropicalis NOT DETECTED NOT DETECTED Final   Cryptococcus neoformans/gattii NOT DETECTED NOT DETECTED Final    Comment: Performed at [facility], [place] [address] [place], [place] [address]  Blood Culture (routine x 2)     Status: None (Preliminary result)   Collection Time: 01/09/23 10:15 AM   Specimen: BLOOD  Result Value Ref Range Status   Specimen Description BLOOD SITE NOT SPECIFIED  Final   Special Requests   Final    BOTTLES DRAWN AEROBIC AND ANAEROBIC Blood Culture adequate volume   Culture   Final    NO GROWTH 2 DAYS Performed at [facility], [place] [address] [place], [place] [address]    Report Status PENDING  Incomplete  Resp panel by RT-PCR (RSV, Flu A&B, Covid) Anterior Nasal Swab     Status: Abnormal   Collection Time: 01/09/23 10:16 AM   Specimen: Anterior Nasal Swab  Result Value Ref Range Status   SARS Coronavirus 2 by RT PCR POSITIVE (A) NEGATIVE Final   Influenza A by PCR NEGATIVE NEGATIVE Final   Influenza B by PCR NEGATIVE NEGATIVE Final    Comment: (NOTE) The Xpert Xpress SARS-CoV-2/FLU/RSV plus assay is intended as an aid in the diagnosis of influenza from Nasopharyngeal swab specimens and should not be used as a sole basis for treatment. Nasal washings and aspirates are unacceptable for Xpert Xpress SARS-CoV-2/FLU/RSV testing.  Fact Sheet for Patients: EntrepreneurPulse.com.au  Fact Sheet for Healthcare Providers: IncredibleEmployment.be  This test is not yet approved or cleared by the [place] FDA and has been authorized for detection and/or diagnosis of SARS-CoV-2 by FDA under an Emergency Use Authorization (EUA). This EUA will remain in effect (meaning this test can be used) for the duration of the COVID-19 declaration under Section 564(b)(1) of the Act, 21 U.S.C. section 360bbb-3(b)(1), unless the authorization is terminated or revoked.     Resp Syncytial Virus by PCR NEGATIVE NEGATIVE Final    Comment: (NOTE) Fact Sheet for Patients: EntrepreneurPulse.com.au  Fact Sheet for Healthcare  Providers: IncredibleEmployment.be  This test is not yet approved or cleared by the [place] FDA and has been authorized for detection and/or diagnosis of SARS-CoV-2 by FDA under an Emergency Use Authorization (EUA). This EUA will remain in effect (meaning this test can be used) for the duration of the COVID-19 declaration under Section 564(b)(1) of the Act, 21 U.S.C. section 360bbb-3(b)(1), unless the authorization is terminated or revoked.  Performed at [facility], [place] [address], [place], [place] [address]   Expectorated  Sputum Assessment w Gram Stain, Rflx to Resp Cult     Status: None   Collection Time: 01/09/23 10:41 AM   Specimen: Expectorated Sputum  Result Value Ref Range Status   Specimen Description EXPECTORATED SPUTUM  Final   Special Requests NONE  Final   Sputum evaluation   Final    THIS SPECIMEN IS ACCEPTABLE FOR SPUTUM CULTURE Performed at [facility], [address] [place], [place] [address]    Report Status 01/09/2023 FINAL  Final  Culture, Respiratory w Gram Stain     Status: None (Preliminary result)   Collection Time: 01/09/23 10:41 AM  Result Value Ref Range Status   Specimen Description EXPECTORATED SPUTUM  Final   Special Requests NONE Reflexed from [id number]  Final   Gram Stain   Final    ABUNDANT WBC PRESENT, PREDOMINANTLY PMN FEW GRAM NEGATIVE RODS FEW GRAM POSITIVE COCCI IN PAIRS    Culture   Final    CULTURE REINCUBATED FOR BETTER GROWTH Performed at [facility], [address] [place], [place] [address]    Report Status PENDING  Incomplete         Radiology Studies: ECHOCARDIOGRAM COMPLETE  Result Date: 01/10/2023    ECHOCARDIOGRAM REPORT   Patient Name:   ZIAIR PENSON Date of Exam: 01/10/2023 Medical Rec #:  [id number]         Height:       67.0 in Accession #:    [id number]        Weight:       162.0 lb Date of Birth:  [birth year]         BSA:          1.849 m Patient Age:    82 years           BP:           102/69 mmHg Patient Gender: M                 HR:           69 bpm. Exam Location:  Inpatient Procedure: 2D Echo, Cardiac Doppler, Color Doppler and Intracardiac            Opacification Agent Indications:     Elevated Troponin  History:         Patient has prior history of Echocardiogram examinations, most                  recent 01/13/2022. CAD, Stroke; Risk Factors:Hypertension and                  Dyslipidemia. LV Thrombus, COVID +.  Sonographer:     Mikki Harbor Referring Phys:  [id number] Reinette Cuneo Teena Irani Diagnosing Phys: Epifanio Lesches MD IMPRESSIONS  1. Left ventricular ejection fraction, by estimation, is 45 to 50%. The left ventricle has mildly decreased function. The left ventricle demonstrates regional wall motion abnormalities (see scoring diagram/findings for description). There is moderate asymmetric left ventricular hypertrophy of the basal-septal segment. Left ventricular diastolic parameters are consistent with Grade I diastolic dysfunction (impaired relaxation).  2. Apical akinesis with contrast images showing swirling artifact at apex consistent with slow flow but no clear thrombus seen  3. Right ventricular systolic function is normal. The right ventricular size is normal.  4. Left atrial size was mildly dilated.  5. The mitral valve is normal in structure. No evidence of mitral valve regurgitation. No evidence of mitral stenosis.  6. The aortic valve  was not well visualized. Aortic valve regurgitation is not visualized. No aortic stenosis is present.  7. Aortic dilatation noted. There is dilatation of the aortic root, measuring 42 mm. There is dilatation of the ascending aorta, measuring 39 mm. FINDINGS  Left Ventricle: Left ventricular ejection fraction, by estimation, is 45 to 50%. The left ventricle has mildly decreased function. The left ventricle demonstrates regional wall motion abnormalities. Definity contrast agent was given IV to delineate the left ventricular  endocardial borders. The left ventricular internal cavity size was small. There is moderate asymmetric left ventricular hypertrophy of the basal-septal segment. Left ventricular diastolic parameters are consistent with Grade I diastolic dysfunction (impaired relaxation).  LV Wall Scoring: The apical septal segment, apical inferior segment, and apex are akinetic. The entire anterior wall, entire lateral wall, anterior septum, inferior wall, mid inferoseptal segment, and basal inferoseptal segment are normal. Right Ventricle: The right ventricular size is normal. No increase in right ventricular wall thickness. Right ventricular systolic function is normal. The tricuspid regurgitant velocity is 2.62 m/s, and with an assumed right atrial pressure of 8 mmHg, the estimated right ventricular systolic pressure is 01.0 mmHg. Left Atrium: Left atrial size was mildly dilated. Right Atrium: Right atrial size was normal in size. Pericardium: There is no evidence of pericardial effusion. Mitral Valve: The mitral valve is normal in structure. No evidence of mitral valve regurgitation. No evidence of mitral valve stenosis. MV peak gradient, 3.1 mmHg. The mean mitral valve gradient is 1.0 mmHg. Tricuspid Valve: The tricuspid valve is normal in structure. Tricuspid valve regurgitation is trivial. Aortic Valve: The aortic valve was not well visualized. Aortic valve regurgitation is not visualized. No aortic stenosis is present. Aortic valve mean gradient measures 4.0 mmHg. Aortic valve peak gradient measures 7.4 mmHg. Aortic valve area, by VTI measures 2.68 cm. Pulmonic Valve: The pulmonic valve was not well visualized. Pulmonic valve regurgitation is not visualized. Aorta: Aortic dilatation noted. There is dilatation of the aortic root, measuring 42 mm. There is dilatation of the ascending aorta, measuring 39 mm. IAS/Shunts: The interatrial septum was not well visualized.  LEFT VENTRICLE PLAX 2D LVIDd:         5.00 cm   Diastology  LVIDs:         2.80 cm   LV e' medial:    6.06 cm/s LV PW:         1.20 cm   LV E/e' medial:  9.4 LV IVS:        1.30 cm   LV e' lateral:   12.40 cm/s LVOT diam:     2.20 cm   LV E/e' lateral: 4.6 LV SV:         77 LV SV Index:   42 LVOT Area:     3.80 cm  RIGHT VENTRICLE RV Basal diam:  2.90 cm RV Mid diam:    3.30 cm LEFT ATRIUM             Index        RIGHT ATRIUM           Index LA diam:        4.30 cm 2.33 cm/m   RA Area:     15.90 cm LA Vol (A2C):   71.6 ml 38.72 ml/m  RA Volume:   38.70 ml  20.93 ml/m LA Vol (A4C):   61.6 ml 33.31 ml/m LA Biplane Vol: 69.2 ml 37.43 ml/m  AORTIC VALVE  PULMONIC VALVE AV Area (Vmax):    2.85 cm     PV Vmax:       0.88 m/s AV Area (Vmean):   2.67 cm     PV Peak grad:  3.1 mmHg AV Area (VTI):     2.68 cm AV Vmax:           136.00 cm/s AV Vmean:          90.400 cm/s AV VTI:            0.288 m AV Peak Grad:      7.4 mmHg AV Mean Grad:      4.0 mmHg LVOT Vmax:         102.00 cm/s LVOT Vmean:        63.500 cm/s LVOT VTI:          0.203 m LVOT/AV VTI ratio: 0.70  AORTA Ao Root diam: 4.20 cm Ao Asc diam:  3.90 cm MITRAL VALVE               TRICUSPID VALVE MV Area (PHT): 2.63 cm    TR Peak grad:   27.5 mmHg MV Area VTI:   3.30 cm    TR Vmax:        262.00 cm/s MV Peak grad:  3.1 mmHg MV Mean grad:  1.0 mmHg    SHUNTS MV Vmax:       0.88 m/s    Systemic VTI:  0.20 m MV Vmean:      48.0 cm/s   Systemic Diam: 2.20 cm MV Decel Time: 288 msec MV E velocity: 56.80 cm/s MV A velocity: 87.80 cm/s MV E/A ratio:  0.65 Epifanio Lesches MD Electronically signed by Epifanio Lesches MD Signature Date/Time: 01/10/2023/7:03:28 PM    Final (Updated)         Scheduled Meds:  amLODipine  5 mg Oral Daily   vitamin C  500 mg Oral Daily   aspirin EC  81 mg Oral Daily   atorvastatin  80 mg Oral Daily   doxycycline  100 mg Oral Q12H   ipratropium-albuterol  3 mL Nebulization Q6H   levETIRAcetam  500 mg Oral BID   methylPREDNISolone (SOLU-MEDROL) injection  80  mg Intravenous Q8H   metoprolol succinate  50 mg Oral QHS   pantoprazole  40 mg Oral BID   tamsulosin  0.4 mg Oral Daily   Warfarin - Pharmacist Dosing Inpatient   Does not apply q1600   zinc sulfate  220 mg Oral Daily   Continuous Infusions:  cefTRIAXone (ROCEPHIN)  IV 2 g (01/11/23 0928)   remdesivir 100 mg in sodium chloride 0.9 % 100 mL IVPB 100 mg (01/11/23 1112)     LOS: 2 days       Huey Bienenstock, MD [facility]   To contact the attending provider between 7A-7P or the covering provider during after hours 7P-7A, please log into the web site [email or web address] and access using universal [place] password for that web site. If you do not have the password, please call the hospital operator.  01/11/2023, 11:19 AM

## 2023-01-11 NOTE — Progress Notes (Signed)
ANTICOAGULATION CONSULT NOTE   Pharmacy Consult for warfarin Indication:  h/o CVA and LV thrombus  No Known Allergies  Patient Measurements: Weight: 73.5 kg (162 lb)  Vital Signs: Temp: 98.4 F (36.9 C) (02/08 0831) Temp Source: Oral (02/08 0831) BP: 114/73 (02/08 0831) Pulse Rate: 80 (02/07 2300)  Labs: Recent Labs    01/09/23 1010 01/09/23 1205 01/09/23 1317 01/09/23 1929 01/10/23 0655 01/10/23 0847 01/10/23 1200 01/11/23 0252  HGB 13.0 12.6*  --   --  12.0*  --   --  11.4*  HCT 39.5 37.0*  --   --  35.8*  --   --  34.6*  PLT 159  --   --   --  144*  --   --  154  APTT 43*  --   --   --   --   --   --   --   LABPROT 23.0*  --   --   --  29.3*  --   --  43.7*  INR 2.1*  --   --   --  2.8*  --   --  4.7*  CREATININE 1.59*  --   --   --  1.59*  --   --  2.07*  TROPONINIHS 24*  --    < > 702*  --  838* 790*  --    < > = values in this interval not displayed.     Estimated Creatinine Clearance: 26.6 mL/min (A) (by C-G formula based on SCr of 2.07 mg/dL (H)).  Assessment: 82yo male c/o SOB and fever x4d, admitted for Covid, to continue Coumadin for CVA and LV thrombus  Dose PTA - 6 mg daily except 3 mg on Mondays  INR up to 4.7 today  Goal of Therapy:  INR 2-3   Plan:  Hold Warfarin today Daily INR  Thank you Anette Guarneri, PharmD 01/11/2023,9:33 AM

## 2023-01-11 NOTE — Progress Notes (Incomplete)
Rounding Note    Patient Name: Roberto Dickson Date of Encounter: 01/11/2023  [place] Cardiologist: Buford Dresser, MD   Subjective   ***  Inpatient Medications    Scheduled Meds:  amLODipine  5 mg Oral Daily   vitamin C  500 mg Oral Daily   aspirin EC  81 mg Oral Daily   atorvastatin  80 mg Oral Daily   doxycycline  100 mg Oral Q12H   ipratropium-albuterol  3 mL Nebulization Q6H   levETIRAcetam  500 mg Oral BID   methylPREDNISolone (SOLU-MEDROL) injection  80 mg Intravenous Q8H   metoprolol succinate  50 mg Oral QHS   pantoprazole  40 mg Oral BID   tamsulosin  0.4 mg Oral Daily   Warfarin - Pharmacist Dosing Inpatient   Does not apply q1600   zinc sulfate  220 mg Oral Daily   Continuous Infusions:  cefTRIAXone (ROCEPHIN)  IV 2 g (01/11/23 0928)   remdesivir 100 mg in sodium chloride 0.9 % 100 mL IVPB Stopped (01/10/23 1228)   PRN Meds: acetaminophen **OR** acetaminophen, guaiFENesin-dextromethorphan, ondansetron **OR** ondansetron (ZOFRAN) IV, phenol   Vital Signs    Vitals:   01/10/23 2300 01/11/23 0831 01/11/23 0851 01/11/23 0937  BP: 125/75 114/73    Pulse: 80     Resp: (!) 86     Temp: 98.5 F (36.9 C) 98.4 F (36.9 C)    TempSrc: Oral Oral    SpO2: 92% 96% 94% 92%  Weight:        Intake/Output Summary (Last 24 hours) at 01/11/2023 1023 Last data filed at 01/11/2023 0407 Gross per 24 hour  Intake 200 ml  Output 450 ml  Net -250 ml      01/09/2023   12:00 PM 11/30/2022    3:30 PM 11/23/2022    2:03 PM  Last 3 Weights  Weight (lbs) 162 lb 174 lb 172 lb  Weight (kg) 73.483 kg 78.926 kg 78.019 kg      Telemetry    *** - Personally Reviewed  ECG    N/A today - Personally Reviewed  Physical Exam  *** GEN: No acute distress.   Neck: No JVD Cardiac: RRR, no murmurs, rubs, or gallops.  Respiratory: Clear to auscultation bilaterally. GI: Soft, nontender, non-distended  MS: No edema; No deformity. Neuro:  Nonfocal   Psych: Normal affect   Labs    High Sensitivity Troponin:   Recent Labs  Lab 01/09/23 1010 01/09/23 1317 01/09/23 1929 01/10/23 0847 01/10/23 1200  TROPONINIHS 24* 102* 702* 838* 790*     Chemistry Recent Labs  Lab 01/09/23 1010 01/09/23 1205 01/09/23 1317 01/10/23 0655 01/11/23 0252  NA 139 143  --  139 135  K 3.6 3.5  --  3.7 3.7  CL 111  --   --  110 107  CO2 17*  --   --  20* 18*  GLUCOSE 115*  --   --  135* 150*  BUN 25*  --   --  30* 47*  CREATININE 1.59*  --   --  1.59* 2.07*  CALCIUM 8.1*  --   --  8.0* 7.6*  MG  --   --  1.6* 2.2 2.2  PROT 6.9  --   --  5.7* 5.6*  ALBUMIN 3.7  --   --  2.7* 2.6*  AST 34  --   --  31 35  ALT 34  --   --  28 25  ALKPHOS 63  --   --  38 40  BILITOT 1.0  --   --  0.9 0.4  GFRNONAA 44*  --   --  44* 32*  ANIONGAP 11  --   --  9 10    Lipids No results for input(s): "CHOL", "TRIG", "HDL", "LABVLDL", "LDLCALC", "CHOLHDL" in the last 168 hours.  Hematology Recent Labs  Lab 01/09/23 1010 01/09/23 1205 01/10/23 0655 01/11/23 0252  WBC 12.4*  --  13.0* 10.8*  RBC 4.13*  --  3.73* 3.65*  HGB 13.0 12.6* 12.0* 11.4*  HCT 39.5 37.0* 35.8* 34.6*  MCV 95.6  --  96.0 94.8  MCH 31.5  --  32.2 31.2  MCHC 32.9  --  33.5 32.9  RDW 14.5  --  14.5 14.4  PLT 159  --  144* 154   Thyroid No results for input(s): "TSH", "FREET4" in the last 168 hours.  BNP Recent Labs  Lab 01/09/23 1010  BNP 195.7*    DDimer  Recent Labs  Lab 01/10/23 0655 01/11/23 0252  DDIMER 2.06* 1.80*     Radiology    ECHOCARDIOGRAM COMPLETE  Result Date: 01/10/2023    ECHOCARDIOGRAM REPORT   Patient Name:   Roberto Dickson Date of Exam: 01/10/2023 Medical Rec #:  [id number]         Height:       67.0 in Accession #:    [id number]        Weight:       162.0 lb Date of Birth:  Mar 06, 1941         BSA:          1.849 m Patient Age:    82 years          BP:           102/69 mmHg Patient Gender: M                 HR:           69 bpm. Exam Location:   Inpatient Procedure: 2D Echo, Cardiac Doppler, Color Doppler and Intracardiac            Opacification Agent Indications:     Elevated Troponin  History:         Patient has prior history of Echocardiogram examinations, most                  recent 01/13/2022. CAD, Stroke; Risk Factors:Hypertension and                  Dyslipidemia. LV Thrombus, COVID +.  Sonographer:     Wenda Low Referring Phys:  [place] Diagnosing Phys: Oswaldo Milian MD IMPRESSIONS  1. Left ventricular ejection fraction, by estimation, is 45 to 50%. The left ventricle has mildly decreased function. The left ventricle demonstrates regional wall motion abnormalities (see scoring diagram/findings for description). There is moderate asymmetric left ventricular hypertrophy of the basal-septal segment. Left ventricular diastolic parameters are consistent with Grade I diastolic dysfunction (impaired relaxation).  2. Apical akinesis with contrast images showing swirling artifact at apex consistent with slow flow but no clear thrombus seen  3. Right ventricular systolic function is normal. The right ventricular size is normal.  4. Left atrial size was mildly dilated.  5. The mitral valve is normal in structure. No evidence of mitral valve regurgitation. No evidence of mitral stenosis.  6. The aortic valve was not well visualized. Aortic valve regurgitation is not visualized. No aortic stenosis is present.  7.  Aortic dilatation noted. There is dilatation of the aortic root, measuring 42 mm. There is dilatation of the ascending aorta, measuring 39 mm. FINDINGS  Left Ventricle: Left ventricular ejection fraction, by estimation, is 45 to 50%. The left ventricle has mildly decreased function. The left ventricle demonstrates regional wall motion abnormalities. Definity contrast agent was given IV to delineate the left ventricular endocardial borders. The left ventricular internal cavity size was small. There is moderate asymmetric  left ventricular hypertrophy of the basal-septal segment. Left ventricular diastolic parameters are consistent with Grade I diastolic dysfunction (impaired relaxation).  LV Wall Scoring: The apical septal segment, apical inferior segment, and apex are akinetic. The entire anterior wall, entire lateral wall, anterior septum, inferior wall, mid inferoseptal segment, and basal inferoseptal segment are normal. Right Ventricle: The right ventricular size is normal. No increase in right ventricular wall thickness. Right ventricular systolic function is normal. The tricuspid regurgitant velocity is 2.62 m/s, and with an assumed right atrial pressure of 8 mmHg, the estimated right ventricular systolic pressure is 35.5 mmHg. Left Atrium: Left atrial size was mildly dilated. Right Atrium: Right atrial size was normal in size. Pericardium: There is no evidence of pericardial effusion. Mitral Valve: The mitral valve is normal in structure. No evidence of mitral valve regurgitation. No evidence of mitral valve stenosis. MV peak gradient, 3.1 mmHg. The mean mitral valve gradient is 1.0 mmHg. Tricuspid Valve: The tricuspid valve is normal in structure. Tricuspid valve regurgitation is trivial. Aortic Valve: The aortic valve was not well visualized. Aortic valve regurgitation is not visualized. No aortic stenosis is present. Aortic valve mean gradient measures 4.0 mmHg. Aortic valve peak gradient measures 7.4 mmHg. Aortic valve area, by VTI measures 2.68 cm. Pulmonic Valve: The pulmonic valve was not well visualized. Pulmonic valve regurgitation is not visualized. Aorta: Aortic dilatation noted. There is dilatation of the aortic root, measuring 42 mm. There is dilatation of the ascending aorta, measuring 39 mm. IAS/Shunts: The interatrial septum was not well visualized.  LEFT VENTRICLE PLAX 2D LVIDd:         5.00 cm   Diastology LVIDs:         2.80 cm   LV e' medial:    6.06 cm/s LV PW:         1.20 cm   LV E/e' medial:  9.4 LV  IVS:        1.30 cm   LV e' lateral:   12.40 cm/s LVOT diam:     2.20 cm   LV E/e' lateral: 4.6 LV SV:         77 LV SV Index:   42 LVOT Area:     3.80 cm  RIGHT VENTRICLE RV Basal diam:  2.90 cm RV Mid diam:    3.30 cm LEFT ATRIUM             Index        RIGHT ATRIUM           Index LA diam:        4.30 cm 2.33 cm/m   RA Area:     15.90 cm LA Vol (A2C):   71.6 ml 38.72 ml/m  RA Volume:   38.70 ml  20.93 ml/m LA Vol (A4C):   61.6 ml 33.31 ml/m LA Biplane Vol: 69.2 ml 37.43 ml/m  AORTIC VALVE                    PULMONIC VALVE AV Area (Vmax):  2.85 cm     PV Vmax:       0.88 m/s AV Area (Vmean):   2.67 cm     PV Peak grad:  3.1 mmHg AV Area (VTI):     2.68 cm AV Vmax:           136.00 cm/s AV Vmean:          90.400 cm/s AV VTI:            0.288 m AV Peak Grad:      7.4 mmHg AV Mean Grad:      4.0 mmHg LVOT Vmax:         102.00 cm/s LVOT Vmean:        63.500 cm/s LVOT VTI:          0.203 m LVOT/AV VTI ratio: 0.70  AORTA Ao Root diam: 4.20 cm Ao Asc diam:  3.90 cm MITRAL VALVE               TRICUSPID VALVE MV Area (PHT): 2.63 cm    TR Peak grad:   27.5 mmHg MV Area VTI:   3.30 cm    TR Vmax:        262.00 cm/s MV Peak grad:  3.1 mmHg MV Mean grad:  1.0 mmHg    SHUNTS MV Vmax:       0.88 m/s    Systemic VTI:  0.20 m MV Vmean:      48.0 cm/s   Systemic Diam: 2.20 cm MV Decel Time: 288 msec MV E velocity: 56.80 cm/s MV A velocity: 87.80 cm/s MV E/A ratio:  0.65 Oswaldo Milian MD Electronically signed by Oswaldo Milian MD Signature Date/Time: 01/10/2023/7:03:28 PM    Final (Updated)    DG Chest Port 1 View  Result Date: 01/09/2023 CLINICAL DATA:  Questionable sepsis - evaluate for abnormality EXAM: PORTABLE CHEST 1 VIEW COMPARISON:  Radiograph 03/01/2022 FINDINGS: Stable cardiac silhouette. Atherosclerotic calcification of the aorta. There is new bibasilar airspace opacity. Upper lungs clear. No pneumothorax. IMPRESSION: Bibasilar airspace opacities concerning for bilateral lower lobe pneumonia.  Electronically Signed   By: Suzy Bouchard M.D.   On: 01/09/2023 11:16    Cardiac Studies   Echo from 01/10/23:   1. Left ventricular ejection fraction, by estimation, is 45 to 50%. The  left ventricle has mildly decreased function. The left ventricle  demonstrates regional wall motion abnormalities (see scoring  diagram/findings for description). There is moderate  asymmetric left ventricular hypertrophy of the basal-septal segment. Left  ventricular diastolic parameters are consistent with Grade I diastolic  dysfunction (impaired relaxation).   2. Apical akinesis with contrast images showing swirling artifact at apex  consistent with slow flow but no clear thrombus seen   3. Right ventricular systolic function is normal. The right ventricular  size is normal.   4. Left atrial size was mildly dilated.   5. The mitral valve is normal in structure. No evidence of mitral valve  regurgitation. No evidence of mitral stenosis.   6. The aortic valve was not well visualized. Aortic valve regurgitation  is not visualized. No aortic stenosis is present.   7. Aortic dilatation noted. There is dilatation of the aortic root,  measuring 42 mm. There is dilatation of the ascending aorta, measuring 39  mm.    Patient Profile     82 y.o. 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   Assessment & Plan    Elevated troponin Multivessel CAD - Cath 11/ 2022 showed three-vessel CAD with CTO mid LAD, distal LAD diffusely disease with left to left collaterals and moderate caliber diagonal branch severe disease as well as severe disease in left circumflex and first obtuse marginal. There was CTO of the right dominant RCA with left-to-right collaterals. He was evaluated by CT surgery and recommended for CABG but patient prefers medical management - presented with COVID-19  associated symptoms - Hs trop 24> 102> 702> 838> 790 - Echo 01/10/23 showed LVEF 45-50%, moderate asymmetric LVH of the basal-septal segment, grade I DD, Apical akinesis with contrast images showing swirling artifact at apex consistent with slow flow but no clear thrombus seen, normal RV, mild LAE, no significant valvular disease, dilatation of the aortic root 42 mm. Dilatation of the ascending aorta 39 mm. (Largely unchanged from Echo on 01/13/22).  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 Repeat echocardiogram in February 2023 and yesterday showed absence of thrombus but slow flow. - Continue Warfarin per pharmacy dosing  COVID-19 pneumonia  HTN HLD Hx of seizure Hx of CVA - per primary team     For questions or updates, please contact [facility] Please consult [email or web address] for contact info under        Signed, , NP  01/11/2023, 10:23 AM

## 2023-01-11 NOTE — Progress Notes (Signed)
Patient not in any distress, patient not in any distress, will continue to monitor.

## 2023-01-12 DIAGNOSIS — U071 COVID-19: Secondary | ICD-10-CM | POA: Diagnosis not present

## 2023-01-12 DIAGNOSIS — I25118 Atherosclerotic heart disease of native coronary artery with other forms of angina pectoris: Secondary | ICD-10-CM | POA: Diagnosis not present

## 2023-01-12 DIAGNOSIS — J9601 Acute respiratory failure with hypoxia: Secondary | ICD-10-CM | POA: Diagnosis not present

## 2023-01-12 LAB — CULTURE, BLOOD (ROUTINE X 2): Special Requests: ADEQUATE

## 2023-01-12 LAB — CBC WITH DIFFERENTIAL/PLATELET
Abs Immature Granulocytes: 0.05 10*3/uL (ref 0.00–0.07)
Basophils Absolute: 0 10*3/uL (ref 0.0–0.1)
Basophils Relative: 0 %
Eosinophils Absolute: 0 10*3/uL (ref 0.0–0.5)
Eosinophils Relative: 0 %
HCT: 33.9 % — ABNORMAL LOW (ref 39.0–52.0)
Hemoglobin: 11.4 g/dL — ABNORMAL LOW (ref 13.0–17.0)
Immature Granulocytes: 0 %
Lymphocytes Relative: 2 %
Lymphs Abs: 0.2 10*3/uL — ABNORMAL LOW (ref 0.7–4.0)
MCH: 31.8 pg (ref 26.0–34.0)
MCHC: 33.6 g/dL (ref 30.0–36.0)
MCV: 94.7 fL (ref 80.0–100.0)
Monocytes Absolute: 0.5 10*3/uL (ref 0.1–1.0)
Monocytes Relative: 4 %
Neutro Abs: 11.8 10*3/uL — ABNORMAL HIGH (ref 1.7–7.7)
Neutrophils Relative %: 94 %
Platelets: 170 10*3/uL (ref 150–400)
RBC: 3.58 MIL/uL — ABNORMAL LOW (ref 4.22–5.81)
RDW: 14.6 % (ref 11.5–15.5)
WBC: 12.5 10*3/uL — ABNORMAL HIGH (ref 4.0–10.5)
nRBC: 0 % (ref 0.0–0.2)

## 2023-01-12 LAB — COMPREHENSIVE METABOLIC PANEL
ALT: 27 U/L (ref 0–44)
AST: 36 U/L (ref 15–41)
Albumin: 2.6 g/dL — ABNORMAL LOW (ref 3.5–5.0)
Alkaline Phosphatase: 45 U/L (ref 38–126)
Anion gap: 9 (ref 5–15)
BUN: 48 mg/dL — ABNORMAL HIGH (ref 8–23)
CO2: 18 mmol/L — ABNORMAL LOW (ref 22–32)
Calcium: 7.6 mg/dL — ABNORMAL LOW (ref 8.9–10.3)
Chloride: 107 mmol/L (ref 98–111)
Creatinine, Ser: 1.83 mg/dL — ABNORMAL HIGH (ref 0.61–1.24)
GFR, Estimated: 37 mL/min — ABNORMAL LOW (ref >60)
Glucose, Bld: 180 mg/dL — ABNORMAL HIGH (ref 70–99)
Potassium: 4.1 mmol/L (ref 3.5–5.1)
Sodium: 134 mmol/L — ABNORMAL LOW (ref 135–145)
Total Bilirubin: 0.9 mg/dL (ref 0.3–1.2)
Total Protein: 5.4 g/dL — ABNORMAL LOW (ref 6.5–8.1)

## 2023-01-12 LAB — PROTIME-INR
INR: 3.7 — ABNORMAL HIGH (ref 0.8–1.2)
Prothrombin Time: 36.3 seconds — ABNORMAL HIGH (ref 11.4–15.2)

## 2023-01-12 LAB — FERRITIN: Ferritin: 399 ng/mL — ABNORMAL HIGH (ref 24–336)

## 2023-01-12 LAB — MAGNESIUM: Magnesium: 2.1 mg/dL (ref 1.7–2.4)

## 2023-01-12 LAB — D-DIMER, QUANTITATIVE: D-Dimer, Quant: 1.21 ug/mL-FEU — ABNORMAL HIGH (ref 0.00–0.50)

## 2023-01-12 LAB — C-REACTIVE PROTEIN: CRP: 10.4 mg/dL — ABNORMAL HIGH (ref <1.0)

## 2023-01-12 LAB — PHOSPHORUS: Phosphorus: 2.4 mg/dL — ABNORMAL LOW (ref 2.5–4.6)

## 2023-01-12 MED ORDER — K PHOS MONO-SOD PHOS DI & MONO 155-852-130 MG PO TABS
250.0000 mg | ORAL_TABLET | Freq: Three times a day (TID) | ORAL | Status: DC
  Administered 2023-01-13: 250 mg via ORAL
  Filled 2023-01-12: qty 1

## 2023-01-12 MED ORDER — K PHOS MONO-SOD PHOS DI & MONO 155-852-130 MG PO TABS
500.0000 mg | ORAL_TABLET | Freq: Three times a day (TID) | ORAL | Status: AC
  Administered 2023-01-12 (×3): 500 mg via ORAL
  Filled 2023-01-12 (×3): qty 2

## 2023-01-12 NOTE — Progress Notes (Signed)
PHARMACY - PHYSICIAN COMMUNICATION CRITICAL VALUE ALERT - BLOOD CULTURE IDENTIFICATION (BCID)  Roberto Dickson is an 82 y.o. male who presented to [facility] on 01/09/2023   Assessment:  Previous call for strep and changed to ceftriaxone, now with staph epi (not shown below)>>>likely contaminant  Name of physician (or Provider) Contacted: Dr. Claria Dice  Current antibiotics: Ceftriaxone  Changes to prescribed antibiotics recommended:  No changes  Results for orders placed or performed during the hospital encounter of 01/09/23  Blood Culture ID Panel (Reflexed) (Collected: 01/09/2023 10:05 AM)  Result Value Ref Range   Enterococcus faecalis NOT DETECTED NOT DETECTED   Enterococcus Faecium NOT DETECTED NOT DETECTED   Listeria monocytogenes NOT DETECTED NOT DETECTED   Staphylococcus species NOT DETECTED NOT DETECTED   Staphylococcus aureus (BCID) NOT DETECTED NOT DETECTED   Staphylococcus epidermidis NOT DETECTED NOT DETECTED   Staphylococcus lugdunensis NOT DETECTED NOT DETECTED   Streptococcus species DETECTED (A) NOT DETECTED   Streptococcus agalactiae NOT DETECTED NOT DETECTED   Streptococcus pneumoniae NOT DETECTED NOT DETECTED   Streptococcus pyogenes NOT DETECTED NOT DETECTED   A.calcoaceticus-baumannii NOT DETECTED NOT DETECTED   Bacteroides fragilis NOT DETECTED NOT DETECTED   Enterobacterales NOT DETECTED NOT DETECTED   Enterobacter cloacae complex NOT DETECTED NOT DETECTED   Escherichia coli NOT DETECTED NOT DETECTED   Klebsiella aerogenes NOT DETECTED NOT DETECTED   Klebsiella oxytoca NOT DETECTED NOT DETECTED   Klebsiella pneumoniae NOT DETECTED NOT DETECTED   Proteus species NOT DETECTED NOT DETECTED   Salmonella species NOT DETECTED NOT DETECTED   Serratia marcescens NOT DETECTED NOT DETECTED   Haemophilus influenzae NOT DETECTED NOT DETECTED   Neisseria meningitidis NOT DETECTED NOT DETECTED   Pseudomonas aeruginosa NOT DETECTED NOT DETECTED   Stenotrophomonas  maltophilia NOT DETECTED NOT DETECTED   Candida albicans NOT DETECTED NOT DETECTED   Candida auris NOT DETECTED NOT DETECTED   Candida glabrata NOT DETECTED NOT DETECTED   Candida krusei NOT DETECTED NOT DETECTED   Candida parapsilosis NOT DETECTED NOT DETECTED   Candida tropicalis NOT DETECTED NOT DETECTED   Cryptococcus neoformans/gattii NOT DETECTED NOT DETECTED    [name] 01/12/2023  6:45 AM

## 2023-01-12 NOTE — Progress Notes (Signed)
PROGRESS NOTE    [name]  [id number] DOB: 08-17-1941 DOA: 01/09/2023 PCP: Horald Pollen, MD    Chief Complaint  Patient presents with   Shortness of Breath    Fever   Fever    102.9 oral by EMS, 102.7 in hospital    Brief Narrative:   Roberto Dickson is a 82 y.o. 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  His workup significant for COVID-19 pneumonia, with significant hypoxia, admitted for further workup.   Assessment & Plan:   Principal Problem:   Acute respiratory failure with hypoxia (HCC) Active Problems:   Essential hypertension   Left ventricular thrombus   CAD (coronary artery disease)   Hyperlipidemia   History of seizure   Pneumonia due to COVID-19 virus   Hypomagnesemia   Acute respiratory failure with hypoxia (HCC) Pneumonia due to COVID-19 virus  -Patient with significant respiratory failure, severe COVID-19 infection, significantly elevated inflammatory markers . - significant oxygen requirement.  Down to 5 L via HFNC today -Continue to monitor inflammatory markers closely, remains significantly elevated including CRP, and D-dimers . -Given severe COVID infection, he received Actemra 2/7 . -Continue with IV remdesivir . -Sinew with high-dose IV steroids, continue with Solu-Medrol.  . -Elevated procalcitonin, continue with IV doxycycline and Rocephin . -Patient was encouraged to use incentive spirometer and flutter valve .  2/4 blood cultures positive for Staphylococcus epidermidis and Streptococcus parasanguinous -Most likely related to contamination, but will discuss with ID  Elevated troponins - cardiology  input greatly  appreciated, likely due to demand ischemia. -Continue with aspirin    Essential hypertension Continue amlodipine 5 mg p.o. daily. Continue metoprolol succinate 50 mg p.o. daily. Monitor blood pressure and heart rate.     Left ventricular thrombus Continue warfarin per pharmacy. INR supratherapeutic at 3.7 today,     CAD (coronary artery disease) Continue atorvastatin, beta-blocker and warfarin.     Hyperlipidemia Continue atorvastatin 80 mg p.o. daily.     History of seizure Continue Keppra 500 mg p.o. twice daily.     DVT prophylaxis: on warfarin Code Status: Full Family Communication: None at bedside today Disposition:   Status is: Inpatient    Consultants:  Cardiology  Subjective:  Reports he is feeling better today, cough and phlegm has improved.  Objective: Vitals:   01/12/23 0300 01/12/23 0400 01/12/23 0742 01/12/23 0834  BP:  125/73  109/63  Pulse: 80 71  77  Resp: (!) 25 (!) 29  20  Temp:  98.5 F (36.9 C)  98.2 F (36.8 C)  TempSrc:  Oral  Oral  SpO2: 93% 93% 92% 90%  Weight:        Intake/Output Summary (Last 24 hours) at 01/12/2023 1538 Last data filed at 01/12/2023 0900 Gross per 24 hour  Intake 480 ml  Output 800 ml  Net -320 ml   Filed Weights   01/09/23 1200  Weight: 73.5 kg    Examination:   Awake Alert, Oriented X 3, frail, deconditioned Symmetrical Chest wall movement, improved air entry bilaterally RRR,No Gallops,Rubs or new Murmurs, No Parasternal Heave +ve B.Sounds, Abd Soft, No tenderness, No rebound - guarding or rigidity. No Cyanosis, Clubbing or edema, No new Rash or bruise  Data Reviewed: I have personally reviewed following labs and imaging studies  CBC: Recent Labs  Lab 01/09/23 1010 01/09/23 1205 01/10/23 0655 01/11/23 0252 01/12/23 0500  WBC 12.4*  --  13.0* 10.8* 12.5*  NEUTROABS 9.5*  --  10.7* 9.7* 11.8*  HGB 13.0 12.6* 12.0* 11.4* 11.4*  HCT 39.5 37.0* 35.8* 34.6* 33.9*  MCV 95.6  --  96.0 94.8  94.7  PLT 159  --  144* 154 [id number]    Basic Metabolic Panel: Recent Labs  Lab 01/09/23 1010 01/09/23 1205 01/09/23 1317 01/10/23 0655 01/11/23 0252 01/12/23 0500  NA 139 143  --  139 135 134*  K 3.6 3.5  --  3.7 3.7 4.1  CL 111  --   --  110 107 107  CO2 17*  --   --  20* 18* 18*  GLUCOSE 115*  --   --  135* 150* 180*  BUN 25*  --   --  30* 47* 48*  CREATININE 1.59*  --   --  1.59* 2.07* 1.83*  CALCIUM 8.1*  --   --  8.0* 7.6* 7.6*  MG  --   --  1.6* 2.2 2.2 2.1  PHOS  --   --  2.9 2.9 2.5 2.4*    GFR: Estimated Creatinine Clearance: 30.1 mL/min (A) (by C-G formula based on SCr of 1.83 mg/dL (H)).  Liver Function Tests: Recent Labs  Lab 01/09/23 1010 01/10/23 0655 01/11/23 0252 01/12/23 0500  AST 34 31 35 36  ALT 34 28 25 27  $ ALKPHOS 63 38 40 45  BILITOT 1.0 0.9 0.4 0.9  PROT 6.9 5.7* 5.6* 5.4*  ALBUMIN 3.7 2.7* 2.6* 2.6*    CBG: No results for input(s): "GLUCAP" in the last 168 hours.   Recent Results (from the past 240 hour(s))  Blood Culture (routine x 2)     Status: Abnormal   Collection Time: 01/09/23 10:05 AM   Specimen: BLOOD  Result Value Ref Range Status   Specimen Description BLOOD SITE NOT SPECIFIED  Final   Special Requests   Final    BOTTLES DRAWN AEROBIC AND ANAEROBIC Blood Culture adequate volume   Culture  Setup Time   Final    GRAM POSITIVE COCCI IN BOTH AEROBIC AND ANAEROBIC BOTTLES CRITICAL RESULT CALLED TO, READ BACK BY AND VERIFIED WITH: M PHAM,PHARMD@0615$  01/10/23 [place]    Culture (A)  Final    STREPTOCOCCUS PARASANGUINIS STAPHYLOCOCCUS EPIDERMIDIS THE SIGNIFICANCE OF ISOLATING THIS ORGANISM FROM A SINGLE SET OF BLOOD CULTURES WHEN MULTIPLE SETS ARE DRAWN IS UNCERTAIN. PLEASE NOTIFY THE MICROBIOLOGY DEPARTMENT WITHIN ONE WEEK IF SPECIATION AND SENSITIVITIES ARE REQUIRED. CRITICAL RESULT CALLED TO, READ BACK BY AND VERIFIED WITH: Sweeny [id number] FCP Performed at [facility], [place] [address], [place], [place]  [address]    Report Status 01/12/2023 FINAL  Final  Blood Culture ID Panel (Reflexed)     Status: Abnormal   Collection Time: 01/09/23 10:05 AM  Result Value Ref Range Status   Enterococcus faecalis NOT DETECTED NOT DETECTED Final   Enterococcus Faecium NOT DETECTED NOT DETECTED Final   Listeria monocytogenes NOT DETECTED NOT DETECTED Final   Staphylococcus species NOT DETECTED NOT DETECTED Final   Staphylococcus aureus (BCID) NOT DETECTED NOT DETECTED Final   Staphylococcus epidermidis NOT DETECTED NOT DETECTED Final   Staphylococcus lugdunensis NOT DETECTED NOT DETECTED Final   Streptococcus species DETECTED (A) NOT DETECTED Final    Comment: Not Enterococcus species, Streptococcus agalactiae, Streptococcus pyogenes,  or Streptococcus pneumoniae. CRITICAL RESULT CALLED TO, READ BACK BY AND VERIFIED WITH: M PHAM,PHARMD@0615$  01/10/23 [place]    Streptococcus agalactiae NOT DETECTED NOT DETECTED Final   Streptococcus pneumoniae NOT DETECTED NOT DETECTED Final   Streptococcus pyogenes NOT DETECTED NOT DETECTED Final   A.calcoaceticus-baumannii NOT DETECTED NOT DETECTED Final   Bacteroides fragilis NOT DETECTED NOT DETECTED Final   Enterobacterales NOT DETECTED NOT DETECTED Final   Enterobacter cloacae complex NOT DETECTED NOT DETECTED Final   Escherichia coli NOT DETECTED NOT DETECTED Final   Klebsiella aerogenes NOT DETECTED NOT DETECTED Final   Klebsiella oxytoca NOT DETECTED NOT DETECTED Final   Klebsiella pneumoniae NOT DETECTED NOT DETECTED Final   Proteus species NOT DETECTED NOT DETECTED Final   Salmonella species NOT DETECTED NOT DETECTED Final   Serratia marcescens NOT DETECTED NOT DETECTED Final   Haemophilus influenzae NOT DETECTED NOT DETECTED Final   Neisseria meningitidis NOT DETECTED NOT DETECTED Final   Pseudomonas aeruginosa NOT DETECTED NOT DETECTED Final   Stenotrophomonas maltophilia NOT DETECTED NOT DETECTED Final   Candida albicans NOT DETECTED NOT DETECTED Final    Candida auris NOT DETECTED NOT DETECTED Final   Candida glabrata NOT DETECTED NOT DETECTED Final   Candida krusei NOT DETECTED NOT DETECTED Final   Candida parapsilosis NOT DETECTED NOT DETECTED Final   Candida tropicalis NOT DETECTED NOT DETECTED Final   Cryptococcus neoformans/gattii NOT DETECTED NOT DETECTED Final    Comment: Performed at [facility], [address], [place], [place] [address]  Blood Culture (routine x 2)     Status: None (Preliminary result)   Collection Time: 01/09/23 10:15 AM   Specimen: BLOOD  Result Value Ref Range Status   Specimen Description BLOOD SITE NOT SPECIFIED  Final   Special Requests   Final    BOTTLES DRAWN AEROBIC AND ANAEROBIC Blood Culture adequate volume   Culture   Final    NO GROWTH 3 DAYS Performed at [facility], [address] [place], [place] [address]    Report Status PENDING  Incomplete  Resp panel by RT-PCR (RSV, Flu A&B, Covid) Anterior Nasal Swab     Status: Abnormal   Collection Time: 01/09/23 10:16 AM   Specimen: Anterior Nasal Swab  Result Value Ref Range Status   SARS Coronavirus 2 by RT PCR POSITIVE (A) NEGATIVE Final   Influenza A by PCR NEGATIVE NEGATIVE Final   Influenza B by PCR NEGATIVE NEGATIVE Final    Comment: (NOTE) The Xpert Xpress SARS-CoV-2/FLU/RSV plus assay is intended as an aid in the diagnosis of influenza from Nasopharyngeal swab specimens and should not be used as a sole basis for treatment. Nasal washings and aspirates are unacceptable for Xpert Xpress SARS-CoV-2/FLU/RSV testing.  Fact Sheet for Patients: EntrepreneurPulse.com.au  Fact Sheet for Healthcare Providers: IncredibleEmployment.be  This test is not yet approved or cleared by the [place] FDA and has been authorized for detection and/or diagnosis of SARS-CoV-2 by FDA under an Emergency Use Authorization (EUA). This EUA will remain in effect (meaning this test can be used) for the  duration of the COVID-19 declaration under Section 564(b)(1) of the Act, 21 U.S.C. section 360bbb-3(b)(1), unless the authorization is terminated or revoked.     Resp Syncytial Virus by PCR NEGATIVE NEGATIVE Final    Comment: (NOTE) Fact Sheet for Patients: EntrepreneurPulse.com.au  Fact Sheet for Healthcare Providers: IncredibleEmployment.be  This test is not yet approved or cleared by the [place] FDA and has been authorized for detection and/or  diagnosis of SARS-CoV-2 by FDA under an Emergency Use Authorization (EUA). This EUA will remain in effect (meaning this test can be used) for the duration of the COVID-19 declaration under Section 564(b)(1) of the Act, 21 U.S.C. section 360bbb-3(b)(1), unless the authorization is terminated or revoked.  Performed at [facility], [place] [address], [place], [place] [address]   Expectorated Sputum Assessment w Gram Stain, Rflx to Resp Cult     Status: None   Collection Time: 01/09/23 10:41 AM   Specimen: Expectorated Sputum  Result Value Ref Range Status   Specimen Description EXPECTORATED SPUTUM  Final   Special Requests NONE  Final   Sputum evaluation   Final    THIS SPECIMEN IS ACCEPTABLE FOR SPUTUM CULTURE Performed at [facility], [place] [address] [place], [place] [address]    Report Status 01/09/2023 FINAL  Final  Culture, Respiratory w Gram Stain     Status: None   Collection Time: 01/09/23 10:41 AM  Result Value Ref Range Status   Specimen Description EXPECTORATED SPUTUM  Final   Special Requests NONE Reflexed from [id number]  Final   Gram Stain   Final    ABUNDANT WBC PRESENT, PREDOMINANTLY PMN FEW GRAM NEGATIVE RODS FEW GRAM POSITIVE COCCI IN PAIRS    Culture   Final    ABUNDANT Normal respiratory flora-no Staph aureus or Pseudomonas seen Performed at [facility], [place] [address], [place], [place] [address]    Report Status 01/11/2023 FINAL  Final          Radiology Studies: ECHOCARDIOGRAM COMPLETE  Result Date: 01/10/2023    ECHOCARDIOGRAM REPORT   Patient Name:   ZAYLEN HIGHLAND Date of Exam: 01/10/2023 Medical Rec #:  [id number]         Height:       67.0 in Accession #:    [id number]        Weight:       162.0 lb Date of Birth:  [birth year]         BSA:          1.849 m Patient Age:    63 years          BP:           102/69 mmHg Patient Gender: M                 HR:           69 bpm. Exam Location:  Inpatient Procedure: 2D Echo, Cardiac Doppler, Color Doppler and Intracardiac            Opacification Agent Indications:     Elevated Troponin  History:         Patient has prior history of Echocardiogram examinations, most                  recent 01/13/2022. CAD, Stroke; Risk Factors:Hypertension and                  Dyslipidemia. LV Thrombus, COVID +.  Sonographer:     Wenda Low Referring Phys:  [place] Diagnosing Phys: Oswaldo Milian MD IMPRESSIONS  1. Left ventricular ejection fraction, by estimation, is 45 to 50%. The left ventricle has mildly decreased function. The left ventricle demonstrates regional wall motion abnormalities (see scoring diagram/findings for description). There is moderate asymmetric left ventricular hypertrophy of the basal-septal segment. Left ventricular diastolic parameters are consistent with Grade I diastolic dysfunction (impaired relaxation).  2. Apical akinesis with contrast images showing swirling artifact at apex consistent with slow flow but no clear thrombus seen  3. Right ventricular systolic function is normal. The right ventricular size is normal.  4. Left atrial size was mildly dilated.  5. The mitral valve is normal in structure. No evidence of mitral valve regurgitation. No evidence of mitral stenosis.  6. The aortic valve was not well visualized. Aortic valve regurgitation is not visualized. No aortic stenosis is present.  7. Aortic dilatation noted. There is dilatation of the aortic  root, measuring 42 mm. There is dilatation of the ascending aorta, measuring 39 mm. FINDINGS  Left Ventricle: Left ventricular ejection fraction, by estimation, is 45 to 50%. The left ventricle has mildly decreased function. The left ventricle demonstrates regional wall motion abnormalities. Definity contrast agent was given IV to delineate the left ventricular endocardial borders. The left ventricular internal cavity size was small. There is moderate asymmetric left ventricular hypertrophy of the basal-septal segment. Left ventricular diastolic parameters are consistent with Grade I diastolic dysfunction (impaired relaxation).  LV Wall Scoring: The apical septal segment, apical inferior segment, and apex are akinetic. The entire anterior wall, entire lateral wall, anterior septum, inferior wall, mid inferoseptal segment, and basal inferoseptal segment are normal. Right Ventricle: The right ventricular size is normal. No increase in right ventricular wall thickness. Right ventricular systolic function is normal. The tricuspid regurgitant velocity is 2.62 m/s, and with an assumed right atrial pressure of 8 mmHg, the estimated right ventricular systolic pressure is [id number] mmHg. Left Atrium: Left atrial size was mildly dilated. Right Atrium: Right atrial size was normal in size. Pericardium: There is no evidence of pericardial effusion. Mitral Valve: The mitral valve is normal in structure. No evidence of mitral valve regurgitation. No evidence of mitral valve stenosis. MV peak gradient, 3.1 mmHg. The mean mitral valve gradient is 1.0 mmHg. Tricuspid Valve: The tricuspid valve is normal in structure. Tricuspid valve regurgitation is trivial. Aortic Valve: The aortic valve was not well visualized. Aortic valve regurgitation is not visualized. No aortic stenosis is present. Aortic valve mean gradient measures 4.0 mmHg. Aortic valve peak gradient measures 7.4 mmHg. Aortic valve area, by VTI measures 2.68 cm. Pulmonic  Valve: The pulmonic valve was not well visualized. Pulmonic valve regurgitation is not visualized. Aorta: Aortic dilatation noted. There is dilatation of the aortic root, measuring 42 mm. There is dilatation of the ascending aorta, measuring 39 mm. IAS/Shunts: The interatrial septum was not well visualized.  LEFT VENTRICLE PLAX 2D LVIDd:         5.00 cm   Diastology LVIDs:         2.80 cm   LV e' medial:    6.06 cm/s LV PW:         1.20 cm   LV E/e' medial:  9.4 LV IVS:        1.30 cm   LV e' lateral:   12.40 cm/s LVOT diam:     2.20 cm   LV E/e' lateral: 4.6 LV SV:         77 LV SV Index:   42 LVOT Area:     3.80 cm  RIGHT VENTRICLE RV Basal diam:  2.90 cm RV Mid diam:    3.30 cm LEFT ATRIUM             Index        RIGHT ATRIUM           Index LA diam:  4.30 cm 2.33 cm/m   RA Area:     15.90 cm LA Vol (A2C):   71.6 ml 38.72 ml/m  RA Volume:   38.70 ml  20.93 ml/m LA Vol (A4C):   61.6 ml 33.31 ml/m LA Biplane Vol: 69.2 ml 37.43 ml/m  AORTIC VALVE                    PULMONIC VALVE AV Area (Vmax):    2.85 cm     PV Vmax:       0.88 m/s AV Area (Vmean):   2.67 cm     PV Peak grad:  3.1 mmHg AV Area (VTI):     2.68 cm AV Vmax:           136.00 cm/s AV Vmean:          90.400 cm/s AV VTI:            0.288 m AV Peak Grad:      7.4 mmHg AV Mean Grad:      4.0 mmHg LVOT Vmax:         102.00 cm/s LVOT Vmean:        63.500 cm/s LVOT VTI:          0.203 m LVOT/AV VTI ratio: 0.70  AORTA Ao Root diam: 4.20 cm Ao Asc diam:  3.90 cm MITRAL VALVE               TRICUSPID VALVE MV Area (PHT): 2.63 cm    TR Peak grad:   27.5 mmHg MV Area VTI:   3.30 cm    TR Vmax:        262.00 cm/s MV Peak grad:  3.1 mmHg MV Mean grad:  1.0 mmHg    SHUNTS MV Vmax:       0.88 m/s    Systemic VTI:  0.20 m MV Vmean:      48.0 cm/s   Systemic Diam: 2.20 cm MV Decel Time: 288 msec MV E velocity: 56.80 cm/s MV A velocity: 87.80 cm/s MV E/A ratio:  0.65 Oswaldo Milian MD Electronically signed by Oswaldo Milian MD Signature  Date/Time: 01/10/2023/7:03:28 PM    Final (Updated)         Scheduled Meds:  amLODipine  5 mg Oral Daily   vitamin C  500 mg Oral Daily   aspirin EC  81 mg Oral Daily   atorvastatin  80 mg Oral Daily   doxycycline  100 mg Oral Q12H   ipratropium-albuterol  3 mL Nebulization Q6H   levETIRAcetam  500 mg Oral BID   methylPREDNISolone (SOLU-MEDROL) injection  80 mg Intravenous Q8H   metoprolol succinate  50 mg Oral QHS   pantoprazole  40 mg Oral BID   phosphorus  500 mg Oral TID WC   Followed by   Derrill Memo ON 01/13/2023] phosphorus  250 mg Oral TID WC   tamsulosin  0.4 mg Oral Daily   Warfarin - Pharmacist Dosing Inpatient   Does not apply q1600   zinc sulfate  220 mg Oral Daily   Continuous Infusions:  cefTRIAXone (ROCEPHIN)  IV 2 g (01/12/23 0900)     LOS: 3 days       Phillips Climes, MD [facility]   To contact the attending provider between 7A-7P or the covering provider during after hours 7P-7A, please log into the web site [email or web address] and access using universal [place] password for that web site. If you do not have the password,  please call the hospital operator.  01/12/2023, 3:38 PM

## 2023-01-12 NOTE — TOC Progression Note (Signed)
Transition of Care [facility]) - Progression Note    Patient Details  Name: Roberto Dickson MRN: [id number] Date of Birth: Jan 22, 1941  [facility] CM/SW Contact  Levonne Lapping, RN Phone Number: 01/12/2023, 1:54 PM  Clinical Narrative:   Anticipate Patient will dc to home with Family.  Currently on 6L High Flow O2  There was discussion of possibly going home over the weekend. CM requested HOT order from Provider and it was decided he would mostly likely not DC over the weekend.  An order for O2 cannot be written at this timeper provider as we are unsure what the needs will be each day. TOC will continue to follow patient for any additional discharge needs           Expected Discharge Plan and Services                                               Social Determinants of Health (SDOH) Interventions SDOH Screenings   Food Insecurity: No Food Insecurity (11/23/2022)  Housing: Low Risk  (11/23/2022)  Transportation Needs: No Transportation Needs (11/23/2022)  Utilities: Not At Risk (11/23/2022)  Alcohol Screen: Low Risk  (11/23/2022)  Depression (PHQ2-9): Low Risk  (11/23/2022)  Financial Resource Strain: Low Risk  (11/23/2022)  Physical Activity: Sufficiently Active (11/23/2022)  Social Connections: Socially Integrated (11/23/2022)  Stress: No Stress Concern Present (11/23/2022)  Tobacco Use: Low Risk  (01/09/2023)    Readmission Risk Interventions     No data to display

## 2023-01-12 NOTE — Progress Notes (Signed)
2D echo Findings from 01/10/2019 IMPRESSIONS    1. Left ventricular ejection fraction, by estimation, is 45 to 50%. The  left ventricle has mildly decreased function. The left ventricle  demonstrates regional wall motion abnormalities (see scoring  diagram/findings for description). There is moderate  asymmetric left ventricular hypertrophy of the basal-septal segment. Left  ventricular diastolic parameters are consistent with Grade I diastolic  dysfunction (impaired relaxation).   2. Apical akinesis with contrast images showing swirling artifact at apex  consistent with slow flow but no clear thrombus seen   3. Right ventricular systolic function is normal. The right ventricular  size is normal.   4. Left atrial size was mildly dilated.   5. The mitral valve is normal in structure. No evidence of mitral valve  regurgitation. No evidence of mitral stenosis.   6. The aortic valve was not well visualized. Aortic valve regurgitation  is not visualized. No aortic stenosis is present.   7. Aortic dilatation noted. There is dilatation of the aortic root,  measuring 42 mm. There is dilatation of the ascending aorta, measuring 39  mm.   Compared to echo 01/13/2022 the EF appears stable at 45 to 50% with apical akinesis and no significant change.  There is swirling of contrast in the apex consistent with sluggish flow but no evidence of LV thrombus at this time.  Would continue anticoagulation with warfarin per pharmacy.  He has been intolerant to ACE/ARB due to soft BP in the past.  Use of ARNI/ARB/MRA can be revisited as an outpatient.    Since patient is completely asymptomatic from a cardiac standpoint and he has known CAD (not a PCI candidate) and refused CABG, current elevation likely related to demand ischemia in the setting of COVID-19 infection underlying chronic CAD.  Continue current medical therapy for CAD.  No further cardiac recommendations at this time.  Will sign off  [facility]  will sign off.   Medication Recommendations: Aspirin 81 mg daily, atorvastatin 80 mg daily, amlodipine 5 mg daily, Toprol-XL 50 mg daily and warfarin dosing per pharmacy Other recommendations (labs, testing, etc):  None Follow up as an outpatient:  Buford Dresser, MD in 2 weeks

## 2023-01-12 NOTE — Progress Notes (Signed)
Patient not in any distress. Bipap not indicated at this time. Will continue to monitor.

## 2023-01-12 NOTE — Progress Notes (Signed)
Physical Therapy Treatment Patient Details Name: Roberto Dickson MRN: [id number] DOB: 1941-03-02 Today's Date: 01/12/2023   History of Present Illness Pt is a 82 y.o. male who presented to the emergency department with progressively worse dyspnea associated with productive cough, sore throat, rhinorrhea, chills, decreased appetite, fatigue and malaise for the past 4 days. In ED pt positive for COVID-19 and admitted for PNA. 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    PT Comments    Pt is progressing towards goals. Pt is demonstrating improved bed mobility, transfers and gait this session with less need for assistance navigating AD. Pt continues to be limited by respiratory status. Increased O2 needs with functional gait. Short distance gait without progression due to current respiratory status today. Discussed the benefits of sitting up in the chair for heart/lung health; pt stated understanding. Due to pt current functional status, available assistance from family at home and home set up recommending skilled physical therapy services as needed on discharge in [place] setting in order to address endurance and balance to decrease risk for falls.     Recommendations for follow up therapy are one component of a multi-disciplinary discharge planning process, led by the attending physician.  Recommendations may be updated based on patient status, additional functional criteria and insurance authorization.  Follow Up Recommendations  Outpatient PT     Assistance Recommended at Discharge Set up Supervision/Assistance  Patient can return home with the following A little help with walking and/or transfers;Assistance with cooking/housework;Assist for transportation;Help with stairs or ramp for entrance   Equipment Recommendations  None recommended by PT    Recommendations for Other Services       Precautions / Restrictions  Precautions Precautions: Fall Precaution Comments: watch O2 Restrictions Weight Bearing Restrictions: No     Mobility  Bed Mobility Overal bed mobility: Modified Independent               Patient Response: Cooperative  Transfers Overall transfer level: Needs assistance Equipment used: Rolling walker (2 wheels) Transfers: Sit to/from Stand, Bed to chair/wheelchair/BSC Sit to Stand: Min guard   Step pivot transfers: Min guard       General transfer comment: 1x cueing for hand placement with sit to stand. Pt did well navigating RW for transfer with 1x cueing to align AD with chair before sitting.    Ambulation/Gait Ambulation/Gait assistance: Min guard Gait Distance (Feet): 20 Feet Assistive device: Rolling walker (2 wheels) Gait Pattern/deviations: Step-through pattern, Decreased stride length Gait velocity: Decreased cadence Gait velocity interpretation: <1.8 ft/sec, indicate of risk for recurrent falls   General Gait Details: Pt did well navigating AD around he room. No overt LOB noted. O2 sats down to 84% on 8L O2 via [place] after sitting following ambulation. Recovered to 91% on 6L with rest for ~2 min .   Stairs Stairs:  (Not performed today due to current respiratory status.)                Balance Overall balance assessment: Mild deficits observed, not formally tested Sitting-balance support: Feet supported Sitting balance-Leahy Scale: Normal     Standing balance support: No upper extremity supported, Bilateral upper extremity supported, During functional activity Standing balance-Leahy Scale: Fair Standing balance comment: No overt LOB.        Cognition Arousal/Alertness: Awake/alert Behavior During Therapy: WFL for tasks assessed/performed Overall Cognitive Status: Within Functional Limits for tasks assessed  General Comments: Interpretor [id number]           General Comments General comments (skin integrity, edema, etc.): Pt is  mostly limited by respiratory status.      Pertinent Vitals/Pain Pain Assessment Pain Assessment: No/denies pain       Prior Function    PT Goals (current goals can now be found in the care plan section) Acute Rehab PT Goals Patient Stated Goal: return home and regain strength/endurance PT Goal Formulation: With patient Time For Goal Achievement: 01/25/23 Potential to Achieve Goals: Good Progress towards PT goals: Progressing toward goals    Frequency    Min 3X/week      PT Plan Current plan remains appropriate       AM-PAC PT "6 Clicks" Mobility   Outcome Measure  Help needed turning from your back to your side while in a flat bed without using bedrails?: None Help needed moving from lying on your back to sitting on the side of a flat bed without using bedrails?: None Help needed moving to and from a bed to a chair (including a wheelchair)?: A Little Help needed standing up from a chair using your arms (e.g., wheelchair or bedside chair)?: A Little Help needed to walk in hospital room?: A Little Help needed climbing 3-5 steps with a railing? : A Little 6 Click Score: 20    End of Session Equipment Utilized During Treatment: Gait belt;Oxygen Activity Tolerance: Patient tolerated treatment well Patient left: in chair;with call bell/phone within reach Nurse Communication: Mobility status PT Visit Diagnosis: Muscle weakness (generalized) (M62.81);Difficulty in walking, not elsewhere classified (R26.2);Unsteadiness on feet (R26.81)     Time: [id number] PT Time Calculation (min) (ACUTE ONLY): 28 min  Charges:  $Gait Training: 8-22 mins $Therapeutic Activity: 8-22 mins                    Tomma Rakers, DPT, CLT  Acute Rehabilitation Services Office: [phone] (Secure chat preferred)    Ander Purpura 01/12/2023, 4:34 PM

## 2023-01-12 NOTE — Progress Notes (Signed)
ANTICOAGULATION CONSULT NOTE   Pharmacy Consult for warfarin Indication:  h/o CVA and LV thrombus  No Known Allergies  Patient Measurements: Weight: 73.5 kg (162 lb)  Vital Signs: Temp: 98.2 F (36.8 C) (02/09 0834) Temp Source: Oral (02/09 0834) BP: 109/63 (02/09 0834) Pulse Rate: 77 (02/09 0834)  Labs: Recent Labs    01/09/23 1929 01/10/23 0655 01/10/23 0847 01/10/23 1200 01/11/23 0252 01/12/23 0500  HGB  --  12.0*  --   --  11.4* 11.4*  HCT  --  35.8*  --   --  34.6* 33.9*  PLT  --  144*  --   --  154 170  LABPROT  --  29.3*  --   --  43.7* 36.3*  INR  --  2.8*  --   --  4.7* 3.7*  CREATININE  --  1.59*  --   --  2.07* 1.83*  TROPONINIHS 702*  --  838* 790*  --   --      Estimated Creatinine Clearance: 30.1 mL/min (A) (by C-G formula based on SCr of 1.83 mg/dL (H)).  Assessment: 82yo male c/o SOB and fever x4d, admitted for Covid, to continue Coumadin for CVA and LV thrombus  Dose PTA - 6 mg daily except 3 mg on Mondays  INR  trending back down to 3.7  Goal of Therapy:  INR 2-3   Plan:  Hold Warfarin today Daily INR  Thank you Anette Guarneri, PharmD 01/12/2023,11:26 AM

## 2023-01-13 DIAGNOSIS — J9601 Acute respiratory failure with hypoxia: Secondary | ICD-10-CM | POA: Diagnosis not present

## 2023-01-13 DIAGNOSIS — U071 COVID-19: Secondary | ICD-10-CM | POA: Diagnosis not present

## 2023-01-13 LAB — COMPREHENSIVE METABOLIC PANEL
ALT: 39 U/L (ref 0–44)
AST: 45 U/L — ABNORMAL HIGH (ref 15–41)
Albumin: 2.7 g/dL — ABNORMAL LOW (ref 3.5–5.0)
Alkaline Phosphatase: 50 U/L (ref 38–126)
Anion gap: 11 (ref 5–15)
BUN: 43 mg/dL — ABNORMAL HIGH (ref 8–23)
CO2: 20 mmol/L — ABNORMAL LOW (ref 22–32)
Calcium: 7.7 mg/dL — ABNORMAL LOW (ref 8.9–10.3)
Chloride: 105 mmol/L (ref 98–111)
Creatinine, Ser: 1.71 mg/dL — ABNORMAL HIGH (ref 0.61–1.24)
GFR, Estimated: 40 mL/min — ABNORMAL LOW (ref >60)
Glucose, Bld: 209 mg/dL — ABNORMAL HIGH (ref 70–99)
Potassium: 3.6 mmol/L (ref 3.5–5.1)
Sodium: 136 mmol/L (ref 135–145)
Total Bilirubin: 0.8 mg/dL (ref 0.3–1.2)
Total Protein: 5.8 g/dL — ABNORMAL LOW (ref 6.5–8.1)

## 2023-01-13 LAB — CBC WITH DIFFERENTIAL/PLATELET
Abs Immature Granulocytes: 0.08 10*3/uL — ABNORMAL HIGH (ref 0.00–0.07)
Basophils Absolute: 0 10*3/uL (ref 0.0–0.1)
Basophils Relative: 0 %
Eosinophils Absolute: 0 10*3/uL (ref 0.0–0.5)
Eosinophils Relative: 0 %
HCT: 36.3 % — ABNORMAL LOW (ref 39.0–52.0)
Hemoglobin: 12.4 g/dL — ABNORMAL LOW (ref 13.0–17.0)
Immature Granulocytes: 1 %
Lymphocytes Relative: 2 %
Lymphs Abs: 0.2 10*3/uL — ABNORMAL LOW (ref 0.7–4.0)
MCH: 31.4 pg (ref 26.0–34.0)
MCHC: 34.2 g/dL (ref 30.0–36.0)
MCV: 91.9 fL (ref 80.0–100.0)
Monocytes Absolute: 0.4 10*3/uL (ref 0.1–1.0)
Monocytes Relative: 4 %
Neutro Abs: 11.2 10*3/uL — ABNORMAL HIGH (ref 1.7–7.7)
Neutrophils Relative %: 93 %
Platelets: 179 10*3/uL (ref 150–400)
RBC: 3.95 MIL/uL — ABNORMAL LOW (ref 4.22–5.81)
RDW: 14.1 % (ref 11.5–15.5)
WBC: 12 10*3/uL — ABNORMAL HIGH (ref 4.0–10.5)
nRBC: 0 % (ref 0.0–0.2)

## 2023-01-13 LAB — D-DIMER, QUANTITATIVE: D-Dimer, Quant: 1.13 ug/mL-FEU — ABNORMAL HIGH (ref 0.00–0.50)

## 2023-01-13 LAB — GLUCOSE, CAPILLARY: Glucose-Capillary: 172 mg/dL — ABNORMAL HIGH (ref 70–99)

## 2023-01-13 LAB — C-REACTIVE PROTEIN: CRP: 8 mg/dL — ABNORMAL HIGH (ref <1.0)

## 2023-01-13 LAB — PROTIME-INR
INR: 4.2 (ref 0.8–1.2)
Prothrombin Time: 40.4 seconds — ABNORMAL HIGH (ref 11.4–15.2)

## 2023-01-13 LAB — PHOSPHORUS: Phosphorus: 4 mg/dL (ref 2.5–4.6)

## 2023-01-13 LAB — FERRITIN: Ferritin: 412 ng/mL — ABNORMAL HIGH (ref 24–336)

## 2023-01-13 LAB — MAGNESIUM: Magnesium: 2 mg/dL (ref 1.7–2.4)

## 2023-01-13 MED ORDER — IPRATROPIUM-ALBUTEROL 0.5-2.5 (3) MG/3ML IN SOLN
3.0000 mL | Freq: Two times a day (BID) | RESPIRATORY_TRACT | Status: DC
  Administered 2023-01-13 – 2023-01-15 (×4): 3 mL via RESPIRATORY_TRACT
  Filled 2023-01-13 (×4): qty 3

## 2023-01-13 MED ORDER — MAGIC MOUTHWASH
10.0000 mL | Freq: Four times a day (QID) | ORAL | Status: DC
  Administered 2023-01-13 – 2023-01-15 (×7): 10 mL via ORAL
  Filled 2023-01-13 (×12): qty 10

## 2023-01-13 MED ORDER — METHYLPREDNISOLONE SODIUM SUCC 125 MG IJ SOLR
80.0000 mg | Freq: Every day | INTRAMUSCULAR | Status: DC
  Administered 2023-01-14 – 2023-01-15 (×2): 80 mg via INTRAVENOUS
  Filled 2023-01-13 (×2): qty 2

## 2023-01-13 NOTE — Progress Notes (Signed)
PROGRESS NOTE    Roberto Dickson  [id number] DOB: [birth year] DOA: 01/09/2023 PCP: Horald Pollen, MD    Chief Complaint  Patient presents with   Shortness of Breath    Fever   Fever    102.9 oral by EMS, 102.7 in hospital    Brief Narrative:   Roberto Dickson is a 82 y.o. 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  His workup significant for COVID-19 pneumonia, with significant hypoxia, admitted for further workup.   Assessment & Plan:   Principal Problem:   Acute respiratory failure with hypoxia (HCC) Active Problems:   Essential hypertension   Left ventricular thrombus   CAD (coronary artery disease)   Hyperlipidemia   History of seizure   Pneumonia due to COVID-19 virus   Hypomagnesemia   Acute respiratory failure with hypoxia (HCC) Pneumonia due to COVID-19 virus  -Patient with significant respiratory failure, severe COVID-19 infection, significantly elevated inflammatory markers . -Significant oxygen requirement initially, on 12 L nasal cannula, improving gradually, he is 4 to 5 L this morning at rest -Continue to monitor inflammatory markers closely, remains significantly elevated including CRP, and D-dimers . -Given severe COVID infection, he received Actemra 2/7 . -Treated  with IV remdesivir X3 days -continue with Solu-Medrol, will decrease to 80 mg daily. -Elevated procalcitonin, treated with IV Rocephin and doxycycline for CAP -Patient was encouraged to use incentive spirometer and flutter valve .  2/4 blood cultures positive for Staphylococcus epidermidis and Streptococcus parasanguinous -This is secondary to contamination as discussed  with ID  Elevated troponins - cardiology  input greatly appreciated, likely due to demand ischemia. -Continue with aspirin    Essential hypertension Continue amlodipine 5 mg p.o. daily. Continue metoprolol succinate 50 mg p.o. daily. Monitor blood pressure and heart rate.     Left ventricular thrombus Continue warfarin per pharmacy. INR supratherapeutic at 3.7 today,     CAD (coronary artery disease) Continue atorvastatin, beta-blocker and warfarin.     Hyperlipidemia Continue atorvastatin 80 mg p.o. daily.     History of seizure Continue Keppra 500 mg p.o. twice daily.     DVT prophylaxis: on warfarin Code Status: Full Family Communication: None at bedside today Disposition:   Status is: Inpatient    Consultants:  Cardiology  Subjective:  He reports some sore throat.  Objective: Vitals:   01/13/23 0600 01/13/23 0800 01/13/23 1100 01/13/23 1111  BP:  134/80    Pulse: 61 70  79  Resp: (!) 23 (!) 22  (!) 23  Temp:   97.8 F (36.6 C)   TempSrc:   Oral   SpO2: 93% 91%  94%  Weight:        Intake/Output Summary (Last 24 hours) at 01/13/2023 1222 Last data filed at 01/12/2023 1731 Gross per 24 hour  Intake 200.06 ml  Output --  Net 200.06 ml   Filed Weights   01/09/23 1200  Weight: 73.5 kg    Examination:   Awake Alert, Oriented X 3, frail, deconditioned Symmetrical Chest wall movement, Good air movement bilaterally, CTAB RRR,No Gallops,Rubs or new Murmurs, No Parasternal Heave +ve B.Sounds, Abd Soft, No tenderness, No rebound - guarding or rigidity. No Cyanosis, Clubbing or edema, No new Rash or bruise  Data Reviewed: I have personally reviewed following labs and imaging studies  CBC: Recent Labs  Lab 01/09/23 1010 01/09/23 1205 01/10/23 0655 01/11/23 0252 01/12/23 0500 01/13/23 0329  WBC 12.4*  --  13.0* 10.8* 12.5* 12.0*  NEUTROABS 9.5*  --  10.7* 9.7* 11.8* 11.2*  HGB 13.0 12.6* 12.0* 11.4* 11.4* 12.4*  HCT 39.5 37.0* 35.8*  34.6* 33.9* 36.3*  MCV 95.6  --  96.0 94.8 94.7 91.9  PLT 159  --  144* 154 170 0000000    Basic Metabolic Panel: Recent Labs  Lab 01/09/23 1010 01/09/23 1205 01/09/23 1317 01/10/23 0655 01/11/23 0252 01/12/23 0500 01/13/23 0329  NA 139 143  --  139 135 134* 136  K 3.6 3.5  --  3.7 3.7 4.1 3.6  CL 111  --   --  110 107 107 105  CO2 17*  --   --  20* 18* 18* 20*  GLUCOSE 115*  --   --  135* 150* 180* 209*  BUN 25*  --   --  30* 47* 48* 43*  CREATININE 1.59*  --   --  1.59* 2.07* 1.83* 1.71*  CALCIUM 8.1*  --   --  8.0* 7.6* 7.6* 7.7*  MG  --   --  1.6* 2.2 2.2 2.1 2.0  PHOS  --   --  2.9 2.9 2.5 2.4* 4.0    GFR: Estimated Creatinine Clearance: 32.2 mL/min (A) (by C-G formula based on SCr of 1.71 mg/dL (H)).  Liver Function Tests: Recent Labs  Lab 01/09/23 1010 01/10/23 0655 01/11/23 0252 01/12/23 0500 01/13/23 0329  AST 34 31 35 36 45*  ALT 34 28 25 27 $ 39  ALKPHOS 63 38 40 45 50  BILITOT 1.0 0.9 0.4 0.9 0.8  PROT 6.9 5.7* 5.6* 5.4* 5.8*  ALBUMIN 3.7 2.7* 2.6* 2.6* 2.7*    CBG: Recent Labs  Lab 01/13/23 0755  GLUCAP 172*     Recent Results (from the past 240 hour(s))  Blood Culture (routine x 2)     Status: Abnormal   Collection Time: 01/09/23 10:05 AM   Specimen: BLOOD  Result Value Ref Range Status   Specimen Description BLOOD SITE NOT SPECIFIED  Final   Special Requests   Final    BOTTLES DRAWN AEROBIC AND ANAEROBIC Blood Culture adequate volume   Culture  Setup Time   Final    GRAM POSITIVE COCCI IN BOTH AEROBIC AND ANAEROBIC BOTTLES CRITICAL RESULT CALLED TO, READ BACK BY AND VERIFIED WITH: M PHAM,PHARMD@0615$  01/10/23 [place]    Culture (A)  Final    STREPTOCOCCUS PARASANGUINIS STAPHYLOCOCCUS EPIDERMIDIS THE SIGNIFICANCE OF ISOLATING THIS ORGANISM FROM A SINGLE SET OF BLOOD CULTURES WHEN MULTIPLE SETS ARE DRAWN IS UNCERTAIN. PLEASE NOTIFY THE MICROBIOLOGY DEPARTMENT WITHIN ONE WEEK IF SPECIATION AND SENSITIVITIES ARE REQUIRED. CRITICAL RESULT CALLED  TO, READ BACK BY AND VERIFIED WITH: [place] [id number] FCP Performed at [facility], [place] [address] [place], [place] [address]    Report Status 01/12/2023 FINAL  Final  Blood Culture ID Panel (Reflexed)     Status: Abnormal   Collection Time: 01/09/23 10:05 AM  Result Value Ref Range Status   Enterococcus faecalis NOT DETECTED NOT DETECTED Final   Enterococcus Faecium NOT DETECTED NOT DETECTED Final   Listeria monocytogenes NOT DETECTED NOT DETECTED Final   Staphylococcus species NOT DETECTED NOT DETECTED Final   Staphylococcus aureus (BCID) NOT DETECTED NOT DETECTED Final   Staphylococcus epidermidis NOT DETECTED NOT DETECTED Final  Staphylococcus lugdunensis NOT DETECTED NOT DETECTED Final   Streptococcus species DETECTED (A) NOT DETECTED Final    Comment: Not Enterococcus species, Streptococcus agalactiae, Streptococcus pyogenes, or Streptococcus pneumoniae. CRITICAL RESULT CALLED TO, READ BACK BY AND VERIFIED WITH: M PHAM,PHARMD@0615$  01/10/23 [place]    Streptococcus agalactiae NOT DETECTED NOT DETECTED Final   Streptococcus pneumoniae NOT DETECTED NOT DETECTED Final   Streptococcus pyogenes NOT DETECTED NOT DETECTED Final   A.calcoaceticus-baumannii NOT DETECTED NOT DETECTED Final   Bacteroides fragilis NOT DETECTED NOT DETECTED Final   Enterobacterales NOT DETECTED NOT DETECTED Final   Enterobacter cloacae complex NOT DETECTED NOT DETECTED Final   Escherichia coli NOT DETECTED NOT DETECTED Final   Klebsiella aerogenes NOT DETECTED NOT DETECTED Final   Klebsiella oxytoca NOT DETECTED NOT DETECTED Final   Klebsiella pneumoniae NOT DETECTED NOT DETECTED Final   Proteus species NOT DETECTED NOT DETECTED Final   Salmonella species NOT DETECTED NOT DETECTED Final   Serratia marcescens NOT DETECTED NOT DETECTED Final   Haemophilus influenzae NOT DETECTED NOT DETECTED Final   Neisseria meningitidis NOT DETECTED NOT DETECTED Final   Pseudomonas aeruginosa NOT DETECTED  NOT DETECTED Final   Stenotrophomonas maltophilia NOT DETECTED NOT DETECTED Final   Candida albicans NOT DETECTED NOT DETECTED Final   Candida auris NOT DETECTED NOT DETECTED Final   Candida glabrata NOT DETECTED NOT DETECTED Final   Candida krusei NOT DETECTED NOT DETECTED Final   Candida parapsilosis NOT DETECTED NOT DETECTED Final   Candida tropicalis NOT DETECTED NOT DETECTED Final   Cryptococcus neoformans/gattii NOT DETECTED NOT DETECTED Final    Comment: Performed at [facility], [address], [place], [place] [address]  Blood Culture (routine x 2)     Status: None (Preliminary result)   Collection Time: 01/09/23 10:15 AM   Specimen: BLOOD  Result Value Ref Range Status   Specimen Description BLOOD SITE NOT SPECIFIED  Final   Special Requests   Final    BOTTLES DRAWN AEROBIC AND ANAEROBIC Blood Culture adequate volume   Culture   Final    NO GROWTH 4 DAYS Performed at [facility], [place] [address], [place], [place] [address]    Report Status PENDING  Incomplete  Resp panel by RT-PCR (RSV, Flu A&B, Covid) Anterior Nasal Swab     Status: Abnormal   Collection Time: 01/09/23 10:16 AM   Specimen: Anterior Nasal Swab  Result Value Ref Range Status   SARS Coronavirus 2 by RT PCR POSITIVE (A) NEGATIVE Final   Influenza A by PCR NEGATIVE NEGATIVE Final   Influenza B by PCR NEGATIVE NEGATIVE Final    Comment: (NOTE) The Xpert Xpress SARS-CoV-2/FLU/RSV plus assay is intended as an aid in the diagnosis of influenza from Nasopharyngeal swab specimens and should not be used as a sole basis for treatment. Nasal washings and aspirates are unacceptable for Xpert Xpress SARS-CoV-2/FLU/RSV testing.  Fact Sheet for Patients: EntrepreneurPulse.com.au  Fact Sheet for Healthcare Providers: IncredibleEmployment.be  This test is not yet approved or cleared by the [place] FDA and has been authorized for detection and/or diagnosis of  SARS-CoV-2 by FDA under an Emergency Use Authorization (EUA). This EUA will remain in effect (meaning this test can be used) for the duration of the COVID-19 declaration under Section 564(b)(1) of the Act, 21 U.S.C. section 360bbb-3(b)(1), unless the authorization is terminated or revoked.     Resp Syncytial Virus by PCR NEGATIVE NEGATIVE Final    Comment: (NOTE) Fact Sheet for Patients: EntrepreneurPulse.com.au  Fact Sheet for Healthcare Providers: IncredibleEmployment.be  This test is not yet approved or cleared by the [place] FDA and has been authorized for detection and/or diagnosis of SARS-CoV-2 by FDA under an Emergency Use Authorization (EUA). This EUA will remain in effect (meaning this test can be used) for the duration of the COVID-19 declaration under Section 564(b)(1) of the Act, 21 U.S.C. section 360bbb-3(b)(1), unless the authorization is terminated or revoked.  Performed at [facility], [place] [address], [place], [place] [address]   Expectorated Sputum Assessment w Gram Stain, Rflx to Resp Cult     Status: None   Collection Time: 01/09/23 10:41 AM   Specimen: Expectorated Sputum  Result Value Ref Range Status   Specimen Description EXPECTORATED SPUTUM  Final   Special Requests NONE  Final   Sputum evaluation   Final    THIS SPECIMEN IS ACCEPTABLE FOR SPUTUM CULTURE Performed at [facility], [place] [address], [place], [place] [address]    Report Status 01/09/2023 FINAL  Final  Culture, Respiratory w Gram Stain     Status: None   Collection Time: 01/09/23 10:41 AM  Result Value Ref Range Status   Specimen Description EXPECTORATED SPUTUM  Final   Special Requests NONE Reflexed from [id number]  Final   Gram Stain   Final    ABUNDANT WBC PRESENT, PREDOMINANTLY PMN FEW GRAM NEGATIVE RODS FEW GRAM POSITIVE COCCI IN PAIRS    Culture   Final    ABUNDANT Normal respiratory flora-no Staph aureus or Pseudomonas  seen Performed at [facility], [place] [address], [place], [place] [address]    Report Status 01/11/2023 FINAL  Final         Radiology Studies: No results found.      Scheduled Meds:  amLODipine  5 mg Oral Daily   vitamin C  500 mg Oral Daily   aspirin EC  81 mg Oral Daily   atorvastatin  80 mg Oral Daily   doxycycline  100 mg Oral Q12H   ipratropium-albuterol  3 mL Nebulization BID   levETIRAcetam  500 mg Oral BID   magic mouthwash  10 mL Oral QID   methylPREDNISolone (SOLU-MEDROL) injection  80 mg Intravenous Q8H   metoprolol succinate  50 mg Oral QHS   pantoprazole  40 mg Oral BID   tamsulosin  0.4 mg Oral Daily   Warfarin - Pharmacist Dosing Inpatient   Does not apply q1600   zinc sulfate  220 mg Oral Daily   Continuous Infusions:     LOS: 4 days       Phillips Climes, MD [facility]   To contact the attending provider between 7A-7P or the covering provider during after hours 7P-7A, please log into the web site [email or web address] and access using universal [place] password for that web site. If you do not have the password, please call the hospital operator.  01/13/2023, 12:22 PM

## 2023-01-13 NOTE — Progress Notes (Signed)
ANTICOAGULATION CONSULT NOTE   Pharmacy Consult for warfarin Indication:  h/o CVA and LV thrombus  No Known Allergies  Patient Measurements: Weight: 73.5 kg (162 lb)  Vital Signs: Temp: 98.7 F (37.1 C) (02/10 0319) Temp Source: Oral (02/10 0319) BP: 130/77 (02/10 0400) Pulse Rate: 61 (02/10 0600)  Labs: Recent Labs    01/10/23 1200 01/11/23 0252 01/11/23 0252 01/12/23 0500 01/13/23 0329  HGB  --  11.4*   < > 11.4* 12.4*  HCT  --  34.6*  --  33.9* 36.3*  PLT  --  154  --  170 179  LABPROT  --  43.7*  --  36.3* 40.4*  INR  --  4.7*  --  3.7* 4.2*  CREATININE  --  2.07*  --  1.83* 1.71*  TROPONINIHS 790*  --   --   --   --    < > = values in this interval not displayed.     Estimated Creatinine Clearance: 32.2 mL/min (A) (by C-G formula based on SCr of 1.71 mg/dL (H)).  Assessment: 82yo male c/o SOB and fever for 4 days admitted for Covid. The plan is to continue Warfarin for CVA and LV thrombus.   Patient's home regimen is 6 mg daily except 3 mg on Mondays - INR on admission was therapeutic at 2.1.   INR is 4.2 today, up from 3.7 yesterday but down from inpatient peak of 4.7 on 2/8. Patient's last dose inpatient was 36m on 2/7. Hemoglobin and hematocrit stable, no signs or symptoms of bleeding per notes.   Goal of Therapy:  INR 2-3   Plan:  Hold Warfarin again today Daily INR  HVicenta Dunning PharmD  PGY1 Pharmacy Resident

## 2023-01-13 NOTE — Progress Notes (Signed)
Accessed pt.. no resp distress noted. Pt resting on 4l [place]. No  bipap needed at this time.

## 2023-01-13 NOTE — Plan of Care (Signed)
  Problem: Coping: Goal: Psychosocial and spiritual needs will be supported Outcome: Progressing   Problem: Respiratory: Goal: Will maintain a patent airway Outcome: Progressing Goal: Complications related to the disease process, condition or treatment will be avoided or minimized Outcome: Progressing   

## 2023-01-14 DIAGNOSIS — J9601 Acute respiratory failure with hypoxia: Secondary | ICD-10-CM | POA: Diagnosis not present

## 2023-01-14 DIAGNOSIS — U071 COVID-19: Secondary | ICD-10-CM | POA: Diagnosis not present

## 2023-01-14 LAB — CULTURE, BLOOD (ROUTINE X 2)
Culture: NO GROWTH
Special Requests: ADEQUATE

## 2023-01-14 LAB — COMPREHENSIVE METABOLIC PANEL
ALT: 39 U/L (ref 0–44)
AST: 38 U/L (ref 15–41)
Albumin: 2.4 g/dL — ABNORMAL LOW (ref 3.5–5.0)
Alkaline Phosphatase: 47 U/L (ref 38–126)
Anion gap: 9 (ref 5–15)
BUN: 39 mg/dL — ABNORMAL HIGH (ref 8–23)
CO2: 22 mmol/L (ref 22–32)
Calcium: 7.7 mg/dL — ABNORMAL LOW (ref 8.9–10.3)
Chloride: 107 mmol/L (ref 98–111)
Creatinine, Ser: 1.7 mg/dL — ABNORMAL HIGH (ref 0.61–1.24)
GFR, Estimated: 40 mL/min — ABNORMAL LOW (ref >60)
Glucose, Bld: 128 mg/dL — ABNORMAL HIGH (ref 70–99)
Potassium: 4 mmol/L (ref 3.5–5.1)
Sodium: 138 mmol/L (ref 135–145)
Total Bilirubin: 1 mg/dL (ref 0.3–1.2)
Total Protein: 5.1 g/dL — ABNORMAL LOW (ref 6.5–8.1)

## 2023-01-14 LAB — CBC WITH DIFFERENTIAL/PLATELET
Abs Immature Granulocytes: 0.18 10*3/uL — ABNORMAL HIGH (ref 0.00–0.07)
Basophils Absolute: 0 10*3/uL (ref 0.0–0.1)
Basophils Relative: 0 %
Eosinophils Absolute: 0 10*3/uL (ref 0.0–0.5)
Eosinophils Relative: 0 %
HCT: 34 % — ABNORMAL LOW (ref 39.0–52.0)
Hemoglobin: 11.8 g/dL — ABNORMAL LOW (ref 13.0–17.0)
Immature Granulocytes: 1 %
Lymphocytes Relative: 4 %
Lymphs Abs: 0.5 10*3/uL — ABNORMAL LOW (ref 0.7–4.0)
MCH: 32 pg (ref 26.0–34.0)
MCHC: 34.7 g/dL (ref 30.0–36.0)
MCV: 92.1 fL (ref 80.0–100.0)
Monocytes Absolute: 1.4 10*3/uL — ABNORMAL HIGH (ref 0.1–1.0)
Monocytes Relative: 11 %
Neutro Abs: 10.6 10*3/uL — ABNORMAL HIGH (ref 1.7–7.7)
Neutrophils Relative %: 84 %
Platelets: 180 10*3/uL (ref 150–400)
RBC: 3.69 MIL/uL — ABNORMAL LOW (ref 4.22–5.81)
RDW: 14.1 % (ref 11.5–15.5)
WBC: 12.7 10*3/uL — ABNORMAL HIGH (ref 4.0–10.5)
nRBC: 0 % (ref 0.0–0.2)

## 2023-01-14 LAB — PROTIME-INR
INR: 3.7 — ABNORMAL HIGH (ref 0.8–1.2)
Prothrombin Time: 36.7 seconds — ABNORMAL HIGH (ref 11.4–15.2)

## 2023-01-14 LAB — C-REACTIVE PROTEIN: CRP: 4.6 mg/dL — ABNORMAL HIGH (ref <1.0)

## 2023-01-14 LAB — MAGNESIUM: Magnesium: 2 mg/dL (ref 1.7–2.4)

## 2023-01-14 LAB — D-DIMER, QUANTITATIVE: D-Dimer, Quant: 0.94 ug/mL-FEU — ABNORMAL HIGH (ref 0.00–0.50)

## 2023-01-14 LAB — FERRITIN: Ferritin: 352 ng/mL — ABNORMAL HIGH (ref 24–336)

## 2023-01-14 LAB — PHOSPHORUS: Phosphorus: 3.5 mg/dL (ref 2.5–4.6)

## 2023-01-14 NOTE — Progress Notes (Signed)
Mobility Specialist Progress Note   01/14/23 1818  Mobility  Activity Ambulated with assistance in hallway  Level of Assistance Contact guard assist, steadying assist  Assistive Device Front wheel walker  Distance Ambulated (ft) 280 ft  Activity Response Tolerated well  Mobility Referral Yes  $Mobility charge 1 Mobility   Pre Mobility: 71 HR, 94% SpO2 on 3LO2 During Mobility: 104 HR, 87% SpO2 on 3LO2 Post Mobility: 76 HR, 91% SpO2 on 3LO2  Received pt in chair having no complaints and agreeable. Min cues on posture during ambulation but no physical intervention, CGA throughout. Pt maintaining 87% - 88% SpO2 on 3LO2 but able to recover >90% when able to sit down and take a break. Returned to room w/o fault and left in bed w/ call bell in reach and bed alarm on. Pt remains on 3LO2.   [place]land Falling Mobility Specialist Please contact via SecureChat or  Rehab office at [phone]

## 2023-01-14 NOTE — Progress Notes (Signed)
PROGRESS NOTE    Roberto Dickson  [id number] DOB: 12-22-1940 DOA: 01/09/2023 PCP: Horald Pollen, MD    Chief Complaint  Patient presents with   Shortness of Breath    Fever   Fever    102.9 oral by EMS, 102.7 in hospital    Brief Narrative:   Roberto Dickson is a 82 y.o. 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  His workup significant for COVID-19 pneumonia, with significant hypoxia, admitted for further workup.   Assessment & Plan:   Principal Problem:   Acute respiratory failure with hypoxia (HCC) Active Problems:   Essential hypertension   Left ventricular thrombus   CAD (coronary artery disease)   Hyperlipidemia   History of seizure   Pneumonia due to COVID-19 virus   Hypomagnesemia   Acute respiratory failure with hypoxia (HCC) Pneumonia due to COVID-19 virus  -Patient with significant respiratory failure, severe COVID-19 infection, significantly elevated inflammatory markers . -Significant oxygen requirement initially, on 12 L nasal cannula, improving gradually, he improved, he is on 3 L nasal cannula at rest today.   -Inflammatory markers trending down which is reassuring -Given severe COVID infection, he received Actemra 2/7 . -Treated  with IV remdesivir X3 days -continue with Solu-Medrol, will decrease to 80 mg daily.  Changed to oral steroids upon discharge -Elevated procalcitonin, treated with IV Rocephin and doxycycline for CAP -Patient was encouraged to use incentive spirometer and flutter valve .  2/4 blood cultures positive for Staphylococcus epidermidis and Streptococcus parasanguinous -This is secondary to contamination as  discussed with ID  Elevated troponins - cardiology  input greatly appreciated, likely due to demand ischemia. -Continue with aspirin    Essential hypertension Continue amlodipine 5 mg p.o. daily. Continue metoprolol succinate 50 mg p.o. daily. Monitor blood pressure and heart rate.     Left ventricular thrombus Continue warfarin per pharmacy. INR supratherapeutic at 3.7 today,     CAD (coronary artery disease) Continue atorvastatin, beta-blocker and warfarin.     Hyperlipidemia Continue atorvastatin 80 mg p.o. daily.     History of seizure Continue Keppra 500 mg p.o. twice daily.     DVT prophylaxis: on warfarin Code Status: Full Family Communication: None at bedside today Disposition:   Status is: Inpatient    Consultants:  Cardiology  Subjective:  Reports some cough, otherwise he reports he is feeling better.  Objective: Vitals:   01/14/23 0300 01/14/23 0400 01/14/23 0837 01/14/23 0915  BP:  126/70  136/86  Pulse: 74 81  68  Resp: (!) 28 (!) 22  20  Temp:  98.8 F (37.1 C)    TempSrc:  Oral    SpO2: 98% 97% 93% 90%  Weight:        Intake/Output Summary (Last 24 hours) at 01/14/2023 1436 Last data filed at 01/13/2023 1800 Gross per 24 hour  Intake --  Output 650 ml  Net -650 ml   Filed Weights   01/09/23 1200  Weight: 73.5 kg    Examination:   Awake Alert, Oriented X 3, frail, deconditioned Symmetrical Chest wall movement, Good air movement bilaterally, CTAB RRR,No Gallops,Rubs or new Murmurs, No Parasternal Heave +ve B.Sounds, Abd Soft, No tenderness, No rebound - guarding or rigidity. No Cyanosis, Clubbing or edema,  No new Rash or bruise        Data Reviewed: I have personally reviewed following labs and imaging studies  CBC: Recent Labs  Lab 01/10/23 0655 01/11/23 0252 01/12/23 0500 01/13/23 0329 01/14/23 0645  WBC 13.0* 10.8* 12.5* 12.0* 12.7*  NEUTROABS 10.7* 9.7* 11.8* 11.2* 10.6*  HGB 12.0* 11.4* 11.4* 12.4* 11.8*  HCT  35.8* 34.6* 33.9* 36.3* 34.0*  MCV 96.0 94.8 94.7 91.9 92.1  PLT 144* 154 170 179 [id number]    Basic Metabolic Panel: Recent Labs  Lab 01/10/23 0655 01/11/23 0252 01/12/23 0500 01/13/23 0329 01/14/23 0645  NA 139 135 134* 136 138  K 3.7 3.7 4.1 3.6 4.0  CL 110 107 107 105 107  CO2 20* 18* 18* 20* 22  GLUCOSE 135* 150* 180* 209* 128*  BUN 30* 47* 48* 43* 39*  CREATININE 1.59* 2.07* 1.83* 1.71* 1.70*  CALCIUM 8.0* 7.6* 7.6* 7.7* 7.7*  MG 2.2 2.2 2.1 2.0 2.0  PHOS 2.9 2.5 2.4* 4.0 3.5    GFR: Estimated Creatinine Clearance: 32.4 mL/min (A) (by C-G formula based on SCr of 1.7 mg/dL (H)).  Liver Function Tests: Recent Labs  Lab 01/10/23 0655 01/11/23 0252 01/12/23 0500 01/13/23 0329 01/14/23 0645  AST 31 35 36 45* 38  ALT 28 25 27 $ 39 39  ALKPHOS 38 40 45 50 47  BILITOT 0.9 0.4 0.9 0.8 1.0  PROT 5.7* 5.6* 5.4* 5.8* 5.1*  ALBUMIN 2.7* 2.6* 2.6* 2.7* 2.4*    CBG: Recent Labs  Lab 01/13/23 0755  GLUCAP 172*     Recent Results (from the past 240 hour(s))  Blood Culture (routine x 2)     Status: Abnormal   Collection Time: 01/09/23 10:05 AM   Specimen: BLOOD  Result Value Ref Range Status   Specimen Description BLOOD SITE NOT SPECIFIED  Final   Special Requests   Final    BOTTLES DRAWN AEROBIC AND ANAEROBIC Blood Culture adequate volume   Culture  Setup Time   Final    GRAM POSITIVE COCCI IN BOTH AEROBIC AND ANAEROBIC BOTTLES CRITICAL RESULT CALLED TO, READ BACK BY AND VERIFIED WITH: M PHAM,PHARMD@0615$  01/10/23 [place]    Culture (A)  Final    STREPTOCOCCUS PARASANGUINIS STAPHYLOCOCCUS EPIDERMIDIS THE SIGNIFICANCE OF ISOLATING THIS ORGANISM FROM A SINGLE SET OF BLOOD CULTURES WHEN MULTIPLE SETS ARE DRAWN IS UNCERTAIN. PLEASE NOTIFY THE MICROBIOLOGY DEPARTMENT WITHIN ONE WEEK IF SPECIATION AND SENSITIVITIES ARE REQUIRED. CRITICAL RESULT CALLED TO, READ BACK BY AND VERIFIED WITH: [place] [id number] FCP Performed at [facility], [place] [address]  [place], [place] [address]    Report Status 01/12/2023 FINAL  Final  Blood Culture ID Panel (Reflexed)     Status: Abnormal   Collection Time: 01/09/23 10:05 AM  Result Value Ref Range Status   Enterococcus faecalis NOT DETECTED NOT DETECTED Final   Enterococcus Faecium NOT DETECTED NOT DETECTED Final   Listeria monocytogenes NOT DETECTED NOT DETECTED Final   Staphylococcus species NOT DETECTED NOT DETECTED Final   Staphylococcus aureus (BCID) NOT DETECTED NOT DETECTED Final   Staphylococcus epidermidis NOT DETECTED NOT DETECTED Final   Staphylococcus lugdunensis NOT DETECTED NOT DETECTED Final   Streptococcus species DETECTED (A) NOT DETECTED Final    Comment: Not Enterococcus species, Streptococcus agalactiae, Streptococcus pyogenes, or Streptococcus pneumoniae. CRITICAL RESULT CALLED TO, READ BACK BY AND VERIFIED WITH: M PHAM,PHARMD@0615$  01/10/23 [place]    Streptococcus agalactiae NOT DETECTED NOT DETECTED Final   Streptococcus pneumoniae NOT DETECTED NOT DETECTED  Final   Streptococcus pyogenes NOT DETECTED NOT DETECTED Final   A.calcoaceticus-baumannii NOT DETECTED NOT DETECTED Final   Bacteroides fragilis NOT DETECTED NOT DETECTED Final   Enterobacterales NOT DETECTED NOT DETECTED Final   Enterobacter cloacae complex NOT DETECTED NOT DETECTED Final   Escherichia coli NOT DETECTED NOT DETECTED Final   Klebsiella aerogenes NOT DETECTED NOT DETECTED Final   Klebsiella oxytoca NOT DETECTED NOT DETECTED Final   Klebsiella pneumoniae NOT DETECTED NOT DETECTED Final   Proteus species NOT DETECTED NOT DETECTED Final   Salmonella species NOT DETECTED NOT DETECTED Final   Serratia marcescens NOT DETECTED NOT DETECTED Final   Haemophilus influenzae NOT DETECTED NOT DETECTED Final   Neisseria meningitidis NOT DETECTED NOT DETECTED Final   Pseudomonas aeruginosa NOT DETECTED NOT DETECTED Final   Stenotrophomonas maltophilia NOT DETECTED NOT DETECTED Final   Candida albicans NOT DETECTED NOT  DETECTED Final   Candida auris NOT DETECTED NOT DETECTED Final   Candida glabrata NOT DETECTED NOT DETECTED Final   Candida krusei NOT DETECTED NOT DETECTED Final   Candida parapsilosis NOT DETECTED NOT DETECTED Final   Candida tropicalis NOT DETECTED NOT DETECTED Final   Cryptococcus neoformans/gattii NOT DETECTED NOT DETECTED Final    Comment: Performed at [facility], [place] [address], [place], [place] [address]  Blood Culture (routine x 2)     Status: None   Collection Time: 01/09/23 10:15 AM   Specimen: BLOOD  Result Value Ref Range Status   Specimen Description BLOOD SITE NOT SPECIFIED  Final   Special Requests   Final    BOTTLES DRAWN AEROBIC AND ANAEROBIC Blood Culture adequate volume   Culture   Final    NO GROWTH 5 DAYS Performed at [facility], [address], [place], [place] [address]    Report Status 01/14/2023 FINAL  Final  Resp panel by RT-PCR (RSV, Flu A&B, Covid) Anterior Nasal Swab     Status: Abnormal   Collection Time: 01/09/23 10:16 AM   Specimen: Anterior Nasal Swab  Result Value Ref Range Status   SARS Coronavirus 2 by RT PCR POSITIVE (A) NEGATIVE Final   Influenza A by PCR NEGATIVE NEGATIVE Final   Influenza B by PCR NEGATIVE NEGATIVE Final    Comment: (NOTE) The Xpert Xpress SARS-CoV-2/FLU/RSV plus assay is intended as an aid in the diagnosis of influenza from Nasopharyngeal swab specimens and should not be used as a sole basis for treatment. Nasal washings and aspirates are unacceptable for Xpert Xpress SARS-CoV-2/FLU/RSV testing.  Fact Sheet for Patients: EntrepreneurPulse.com.au  Fact Sheet for Healthcare Providers: IncredibleEmployment.be  This test is not yet approved or cleared by the [place] FDA and has been authorized for detection and/or diagnosis of SARS-CoV-2 by FDA under an Emergency Use Authorization (EUA). This EUA will remain in effect (meaning this test can be used) for the  duration of the COVID-19 declaration under Section 564(b)(1) of the Act, 21 U.S.C. section 360bbb-3(b)(1), unless the authorization is terminated or revoked.     Resp Syncytial Virus by PCR NEGATIVE NEGATIVE Final    Comment: (NOTE) Fact Sheet for Patients: EntrepreneurPulse.com.au  Fact Sheet for Healthcare Providers: IncredibleEmployment.be  This test is not yet approved or cleared by the [place] FDA and has been authorized for detection and/or diagnosis of SARS-CoV-2 by FDA under an Emergency Use Authorization (EUA). This EUA will remain in effect (meaning this test can be used) for the duration of the COVID-19 declaration under Section 564(b)(1) of the Act,  21 U.S.C. section 360bbb-3(b)(1), unless the authorization is terminated or revoked.  Performed at [facility], [place] [address], [place], [place] [address]   Expectorated Sputum Assessment w Gram Stain, Rflx to Resp Cult     Status: None   Collection Time: 01/09/23 10:41 AM   Specimen: Expectorated Sputum  Result Value Ref Range Status   Specimen Description EXPECTORATED SPUTUM  Final   Special Requests NONE  Final   Sputum evaluation   Final    THIS SPECIMEN IS ACCEPTABLE FOR SPUTUM CULTURE Performed at [facility], [place] [address], [place], [place] [address]    Report Status 01/09/2023 FINAL  Final  Culture, Respiratory w Gram Stain     Status: None   Collection Time: 01/09/23 10:41 AM  Result Value Ref Range Status   Specimen Description EXPECTORATED SPUTUM  Final   Special Requests NONE Reflexed from [id number]  Final   Gram Stain   Final    ABUNDANT WBC PRESENT, PREDOMINANTLY PMN FEW GRAM NEGATIVE RODS FEW GRAM POSITIVE COCCI IN PAIRS    Culture   Final    ABUNDANT Normal respiratory flora-no Staph aureus or Pseudomonas seen Performed at [facility], [place] [address] [place], [place] [address]    Report Status 01/11/2023 FINAL  Final          Radiology Studies: No results found.      Scheduled Meds:  amLODipine  5 mg Oral Daily   vitamin C  500 mg Oral Daily   aspirin EC  81 mg Oral Daily   atorvastatin  80 mg Oral Daily   ipratropium-albuterol  3 mL Nebulization BID   levETIRAcetam  500 mg Oral BID   magic mouthwash  10 mL Oral QID   methylPREDNISolone (SOLU-MEDROL) injection  80 mg Intravenous Daily   metoprolol succinate  50 mg Oral QHS   pantoprazole  40 mg Oral BID   tamsulosin  0.4 mg Oral Daily   Warfarin - Pharmacist Dosing Inpatient   Does not apply q1600   zinc sulfate  220 mg Oral Daily   Continuous Infusions:     LOS: 5 days       Phillips Climes, MD [facility]   To contact the attending provider between 7A-7P or the covering provider during after hours 7P-7A, please log into the web site [email or web address] and access using universal [place] password for that web site. If you do not have the password, please call the hospital operator.  01/14/2023, 2:36 PM

## 2023-01-14 NOTE — Progress Notes (Signed)
ANTICOAGULATION CONSULT NOTE   Pharmacy Consult for warfarin Indication:  h/o CVA and LV thrombus  No Known Allergies  Patient Measurements: Weight: 73.5 kg (162 lb)  Vital Signs: Temp: 98.8 F (37.1 C) (02/11 0400) Temp Source: Oral (02/11 0400) BP: 126/70 (02/11 0400) Pulse Rate: 81 (02/11 0400)  Labs: Recent Labs    01/12/23 0500 01/13/23 0329 01/14/23 0645  HGB 11.4* 12.4* 11.8*  HCT 33.9* 36.3* 34.0*  PLT 170 179 180  LABPROT 36.3* 40.4*  --   INR 3.7* 4.2*  --   CREATININE 1.83* 1.71* 1.70*     Estimated Creatinine Clearance: 32.4 mL/min (A) (by C-G formula based on SCr of 1.7 mg/dL (H)).  Assessment: 82yo male c/o SOB and fever for 4 days admitted for Covid. The plan is to continue Warfarin for CVA and LV thrombus.   Patient's home regimen is 6 mg daily except 3 mg on Mondays - INR on admission was therapeutic at 2.1.   INR remains elevated at 3.7. CBC stable  Goal of Therapy:  INR 2-3   Plan:  Hold Warfarin again today Daily INR  Dimple Nanas, PharmD, BCPS 01/14/2023 8:04 AM

## 2023-01-15 ENCOUNTER — Other Ambulatory Visit: Payer: Self-pay | Admitting: Emergency Medicine

## 2023-01-15 ENCOUNTER — Other Ambulatory Visit (HOSPITAL_COMMUNITY): Payer: Self-pay

## 2023-01-15 DIAGNOSIS — I513 Intracardiac thrombosis, not elsewhere classified: Secondary | ICD-10-CM | POA: Diagnosis not present

## 2023-01-15 DIAGNOSIS — J9601 Acute respiratory failure with hypoxia: Secondary | ICD-10-CM | POA: Diagnosis not present

## 2023-01-15 DIAGNOSIS — U071 COVID-19: Secondary | ICD-10-CM | POA: Diagnosis not present

## 2023-01-15 LAB — CBC
HCT: 35.1 % — ABNORMAL LOW (ref 39.0–52.0)
Hemoglobin: 11.5 g/dL — ABNORMAL LOW (ref 13.0–17.0)
MCH: 30.9 pg (ref 26.0–34.0)
MCHC: 32.8 g/dL (ref 30.0–36.0)
MCV: 94.4 fL (ref 80.0–100.0)
Platelets: 189 10*3/uL (ref 150–400)
RBC: 3.72 MIL/uL — ABNORMAL LOW (ref 4.22–5.81)
RDW: 14.1 % (ref 11.5–15.5)
WBC: 11.6 10*3/uL — ABNORMAL HIGH (ref 4.0–10.5)
nRBC: 0 % (ref 0.0–0.2)

## 2023-01-15 LAB — BASIC METABOLIC PANEL WITH GFR
Anion gap: 10 (ref 5–15)
BUN: 42 mg/dL — ABNORMAL HIGH (ref 8–23)
CO2: 21 mmol/L — ABNORMAL LOW (ref 22–32)
Calcium: 7.6 mg/dL — ABNORMAL LOW (ref 8.9–10.3)
Chloride: 104 mmol/L (ref 98–111)
Creatinine, Ser: 1.59 mg/dL — ABNORMAL HIGH (ref 0.61–1.24)
GFR, Estimated: 44 mL/min — ABNORMAL LOW
Glucose, Bld: 176 mg/dL — ABNORMAL HIGH (ref 70–99)
Potassium: 4.2 mmol/L (ref 3.5–5.1)
Sodium: 135 mmol/L (ref 135–145)

## 2023-01-15 LAB — PROTIME-INR
INR: 3.4 — ABNORMAL HIGH (ref 0.8–1.2)
Prothrombin Time: 34.3 seconds — ABNORMAL HIGH (ref 11.4–15.2)

## 2023-01-15 MED ORDER — WARFARIN SODIUM 6 MG PO TABS
ORAL_TABLET | ORAL | 1 refills | Status: DC
  Filled 2023-01-15: qty 30, 30d supply, fill #0
  Filled 2023-01-15: qty 90, 90d supply, fill #0

## 2023-01-15 MED ORDER — WARFARIN 0.5 MG HALF TABLET: 0.5000 mg | ORAL_TABLET | Freq: Once | ORAL | Status: DC

## 2023-01-15 MED ORDER — PREDNISONE 10 MG (21) PO TBPK
ORAL_TABLET | ORAL | 0 refills | Status: DC
  Filled 2023-01-15: qty 21, 6d supply, fill #0

## 2023-01-15 MED ORDER — WARFARIN SODIUM 6 MG PO TABS: ORAL_TABLET | ORAL | 1 refills | Status: DC

## 2023-01-15 MED ORDER — WARFARIN 0.5 MG HALF TABLET
0.5000 mg | ORAL_TABLET | Freq: Once | ORAL | Status: DC
  Filled 2023-01-15: qty 1

## 2023-01-15 NOTE — Progress Notes (Signed)
Still awaiting all of pt's DME to be delivered to room before pt can be discharged.

## 2023-01-15 NOTE — Plan of Care (Signed)

## 2023-01-15 NOTE — Discharge Summary (Signed)
Physician Discharge Summary  PAGE RAMAGLIA [id number] DOB: 1941-06-17 DOA: 01/09/2023  PCP: Horald Pollen, MD  Admit date: 01/09/2023 Discharge date: 01/15/2023  Admitted From: (Home) Disposition:  (Home)  Recommendations for Outpatient Follow-up:  Follow up with PCP in 2 weeks Please obtain BMP/CBC in one week Please repeat two-view chest x-ray in 2 weeks   Home Health: (YES) Equipment/Devices: (oxygen)  Discharge Condition: (Stable) CODE STATUS: (FULL) Diet recommendation: Heart Healthy   Brief/Interim Summary:  Roberto Dickson is a 82 y.o. 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  His workup significant for COVID-19 pneumonia, with significant hypoxia, admitted for further workup.      Acute respiratory failure with hypoxia (HCC) Pneumonia due to COVID-19 virus  -Patient with significant respiratory failure, severe COVID-19 infection, significantly elevated inflammatory markers .  Significant oxygen requirement up to 12 L high flow nasal cannula, he received Actemra 2/7, treated with IV remdesivir x 3 days, he was kept on IV Solu-Medrol, respiratory status much improved after receiving Actemra, he is down to 2 to 3 L nasal cannula with activity, he will be discharged with oxygen and prednisone taper on discharge. -Elevated procalcitonin, treated with IV Rocephin and doxycycline for CAP, no further antibiotics needed at time of discharge -Patient was encouraged to use incentive spirometer and flutter valve , he was instructed to using at home.   2/4 blood cultures positive for Staphylococcus epidermidis and Streptococcus parasanguinous -This is secondary to  contamination as discussed with ID   Elevated troponins - cardiology  input greatly appreciated, likely due to demand ischemia. -Continue with aspirin     Essential hypertension Continue with home medications     Left ventricular thrombus Continue warfarin     CAD (coronary artery disease) Continue atorvastatin, beta-blocker and warfarin.     Hyperlipidemia Continue atorvastatin 80 mg p.o. daily.     History of seizure Continue Keppra 500 mg p.o. twice daily.      Discharge Diagnoses:  Principal Problem:   Acute respiratory failure with hypoxia ([place]) Active Problems:   Essential hypertension   Left ventricular thrombus   CAD (coronary artery disease)   Hyperlipidemia   History of seizure   Pneumonia due to COVID-19 virus   Hypomagnesemia    Discharge Instructions  Discharge Instructions     Diet - low sodium heart healthy   Complete by: As directed    Discharge instructions   Complete by: As directed    Follow with Primary MD Horald Pollen, MD in 14 days   Get CBC, CMP, 2 view Chest X ray checked  by Primary MD next visit.    Activity: As tolerated with Full fall precautions use walker/cane & assistance as needed   Disposition Home    Diet: Heart Healthy .  On your next visit with your primary care physician please Get Medicines reviewed and adjusted.   Please request your Prim.MD to go over all Hospital Tests and Procedure/Radiological results at the follow up, please get all Hospital records sent to your Prim MD by signing hospital release before you go home.   If you experience worsening of your admission symptoms, develop shortness of breath, life threatening emergency, suicidal or homicidal thoughts you must seek medical attention immediately  by calling 911 or calling your MD immediately  if symptoms less severe.  You Must read complete instructions/literature along with all the possible adverse reactions/side effects for all the  Medicines you take and that have been prescribed to you. Take any new Medicines after you have completely understood and accpet all the possible adverse reactions/side effects.   Do not drive, operating heavy machinery, perform activities at heights, swimming or participation in water activities or provide baby sitting services if your were admitted for syncope or siezures until you have seen by Primary MD or a Neurologist and advised to do so again.  Do not drive when taking Pain medications.    Do not take more than prescribed Pain, Sleep and Anxiety Medications  Special Instructions: If you have smoked or chewed Tobacco  in the last 2 yrs please stop smoking, stop any regular Alcohol  and or any Recreational drug use.  Wear Seat belts while driving.   Please note  You were cared for by a hospitalist during your hospital stay. If you have any questions about your discharge medications or the care you received while you were in the hospital after you are discharged, you can call the unit and asked to speak with the hospitalist on call if the hospitalist that took care of you is not available. Once you are discharged, your primary care physician will handle any further medical issues. Please note that NO REFILLS for any discharge medications will be authorized once you are discharged, as it is imperative that you return to your primary care physician (or establish a relationship with a primary care physician if you do not have one) for your aftercare needs so that they can reassess your need for medications and monitor your lab values.   Increase activity slowly   Complete by: As directed       Allergies as of 01/15/2023   No Known Allergies      Medication List     TAKE these medications    acetaminophen 325 MG tablet Commonly known as: TYLENOL Take 1-2 tablets (325-650 mg total) by mouth every 4 (four) hours as needed for mild pain. What changed:  how much to take when to take  this   amLODipine 5 MG tablet Commonly known as: NORVASC Take 1 tablet (5 mg total) by mouth daily.   aspirin EC 81 MG tablet Commonly known as: Aspirin Low Dose Take 1 tablet (81 mg total) by mouth daily. Swallow whole.   atorvastatin 80 MG tablet Commonly known as: LIPITOR Take 1 tablet (80 mg total) by mouth daily.   docusate sodium 100 MG capsule Commonly known as: COLACE Take 1 capsule (100 mg total) by mouth 2 (two) times daily. What changed:  how much to take when to take this   levETIRAcetam 500 MG tablet Commonly known as: Keppra Take 1 tablet (500 mg total) by mouth 2 (two) times daily.   melatonin 3 MG Tabs tablet Take 0.5 tablets (1.5 mg total) by mouth at bedtime. OVER THE COUNTER What changed: additional instructions   metoprolol succinate 50 MG 24 hr tablet Commonly known as: TOPROL-XL TAKE 1 TABLET(50 MG) BY MOUTH AT BEDTIME What changed: See the new instructions.   pantoprazole 40 MG tablet Commonly known as: PROTONIX TAKE 1 TABLET(40 MG) BY MOUTH AT BEDTIME What changed: See the new instructions.   predniSONE 10 MG (21) Tbpk tablet Commonly known as: STERAPRED UNI-PAK 21 TAB Use per package instruction   tamsulosin 0.4 MG  Caps capsule Commonly known as: FLOMAX Take one capsule by mouth daily. What changed:  how much to take how to take this when to take this additional instructions   tiZANidine 2 MG tablet Commonly known as: ZANAFLEX TAKE 1 TABLET BY MOUTH AT BEDTIME AS NEEDED FOR MUSCLE SPASMS What changed: See the new instructions.   warfarin 6 MG tablet Commonly known as: COUMADIN Take as directed. If you are unsure how to take this medication, talk to your nurse or doctor. Original instructions: TAKE 1 TABLET BY MOUTH DAILY AS DIRECTED BY COAGULATION CLINIC - hold warfarin today, and start on 01/16/2023 What changed: additional instructions               Durable Medical Equipment  (From admission, onward)            Start     Ordered   01/15/23 0722  For home use only DME oxygen  Once       Question Answer Comment  Length of Need 6 Months   Mode or (Route) Nasal cannula   Liters per Minute 3   Oxygen delivery system Gas      01/15/23 0721            Follow-up Information     Deberah Pelton, NP Follow up on 01/24/2023.   Specialty: Cardiology Why: at 2:20pm for cardiology appointment Contact information: [address] [place] [address] [place] [address] [place] [facility] Follow up.   Specialty: Rehabilitation Why: Referral has been for outpatient physical therapy Contact information: [address] [id number] [place] [phone]               No Known Allergies  Consultations: Cardiology   Procedures/Studies: ECHOCARDIOGRAM COMPLETE  Result Date: 01/10/2023    ECHOCARDIOGRAM REPORT   Patient Name:   ZIAIR DURDIN Date of Exam: 01/10/2023 Medical Rec #:  [id number]         Height:       67.0 in Accession #:    [id number]        Weight:       162.0 lb Date of Birth:  03-15-1941         BSA:          1.849 m Patient Age:    39 years          BP:           102/69 mmHg Patient Gender: M                 HR:           69 bpm. Exam Location:  Inpatient Procedure: 2D Echo, Cardiac Doppler, Color Doppler and Intracardiac            Opacification Agent Indications:     Elevated Troponin  History:         Patient has prior history of Echocardiogram examinations, most                  recent 01/13/2022. CAD, Stroke; Risk Factors:Hypertension and                  Dyslipidemia. LV Thrombus, COVID +.  Sonographer:     Wenda Low Referring Phys:  [place] Diagnosing Phys: Oswaldo Milian MD IMPRESSIONS  1. Left ventricular ejection fraction, by estimation, is 45 to 50%. The left ventricle has  mildly decreased function. The left ventricle demonstrates regional wall  motion abnormalities (see scoring diagram/findings for description). There is moderate asymmetric left ventricular hypertrophy of the basal-septal segment. Left ventricular diastolic parameters are consistent with Grade I diastolic dysfunction (impaired relaxation).  2. Apical akinesis with contrast images showing swirling artifact at apex consistent with slow flow but no clear thrombus seen  3. Right ventricular systolic function is normal. The right ventricular size is normal.  4. Left atrial size was mildly dilated.  5. The mitral valve is normal in structure. No evidence of mitral valve regurgitation. No evidence of mitral stenosis.  6. The aortic valve was not well visualized. Aortic valve regurgitation is not visualized. No aortic stenosis is present.  7. Aortic dilatation noted. There is dilatation of the aortic root, measuring 42 mm. There is dilatation of the ascending aorta, measuring 39 mm. FINDINGS  Left Ventricle: Left ventricular ejection fraction, by estimation, is 45 to 50%. The left ventricle has mildly decreased function. The left ventricle demonstrates regional wall motion abnormalities. Definity contrast agent was given IV to delineate the left ventricular endocardial borders. The left ventricular internal cavity size was small. There is moderate asymmetric left ventricular hypertrophy of the basal-septal segment. Left ventricular diastolic parameters are consistent with Grade I diastolic dysfunction (impaired relaxation).  LV Wall Scoring: The apical septal segment, apical inferior segment, and apex are akinetic. The entire anterior wall, entire lateral wall, anterior septum, inferior wall, mid inferoseptal segment, and basal inferoseptal segment are normal. Right Ventricle: The right ventricular size is normal. No increase in right ventricular wall thickness. Right ventricular systolic function is normal. The tricuspid regurgitant velocity is 2.62 m/s, and with an assumed right atrial pressure  of 8 mmHg, the estimated right ventricular systolic pressure is [id number] mmHg. Left Atrium: Left atrial size was mildly dilated. Right Atrium: Right atrial size was normal in size. Pericardium: There is no evidence of pericardial effusion. Mitral Valve: The mitral valve is normal in structure. No evidence of mitral valve regurgitation. No evidence of mitral valve stenosis. MV peak gradient, 3.1 mmHg. The mean mitral valve gradient is 1.0 mmHg. Tricuspid Valve: The tricuspid valve is normal in structure. Tricuspid valve regurgitation is trivial. Aortic Valve: The aortic valve was not well visualized. Aortic valve regurgitation is not visualized. No aortic stenosis is present. Aortic valve mean gradient measures 4.0 mmHg. Aortic valve peak gradient measures 7.4 mmHg. Aortic valve area, by VTI measures 2.68 cm. Pulmonic Valve: The pulmonic valve was not well visualized. Pulmonic valve regurgitation is not visualized. Aorta: Aortic dilatation noted. There is dilatation of the aortic root, measuring 42 mm. There is dilatation of the ascending aorta, measuring 39 mm. IAS/Shunts: The interatrial septum was not well visualized.  LEFT VENTRICLE PLAX 2D LVIDd:         5.00 cm   Diastology LVIDs:         2.80 cm   LV e' medial:    6.06 cm/s LV PW:         1.20 cm   LV E/e' medial:  9.4 LV IVS:        1.30 cm   LV e' lateral:   12.40 cm/s LVOT diam:     2.20 cm   LV E/e' lateral: 4.6 LV SV:         77 LV SV Index:   42 LVOT Area:     3.80 cm  RIGHT VENTRICLE RV Basal diam:  2.90 cm RV Mid diam:  3.30 cm LEFT ATRIUM             Index        RIGHT ATRIUM           Index LA diam:        4.30 cm 2.33 cm/m   RA Area:     15.90 cm LA Vol (A2C):   71.6 ml 38.72 ml/m  RA Volume:   38.70 ml  20.93 ml/m LA Vol (A4C):   61.6 ml 33.31 ml/m LA Biplane Vol: 69.2 ml 37.43 ml/m  AORTIC VALVE                    PULMONIC VALVE AV Area (Vmax):    2.85 cm     PV Vmax:       0.88 m/s AV Area (Vmean):   2.67 cm     PV Peak grad:  3.1 mmHg AV  Area (VTI):     2.68 cm AV Vmax:           136.00 cm/s AV Vmean:          90.400 cm/s AV VTI:            0.288 m AV Peak Grad:      7.4 mmHg AV Mean Grad:      4.0 mmHg LVOT Vmax:         102.00 cm/s LVOT Vmean:        63.500 cm/s LVOT VTI:          0.203 m LVOT/AV VTI ratio: 0.70  AORTA Ao Root diam: 4.20 cm Ao Asc diam:  3.90 cm MITRAL VALVE               TRICUSPID VALVE MV Area (PHT): 2.63 cm    TR Peak grad:   27.5 mmHg MV Area VTI:   3.30 cm    TR Vmax:        262.00 cm/s MV Peak grad:  3.1 mmHg MV Mean grad:  1.0 mmHg    SHUNTS MV Vmax:       0.88 m/s    Systemic VTI:  0.20 m MV Vmean:      48.0 cm/s   Systemic Diam: 2.20 cm MV Decel Time: 288 msec MV E velocity: 56.80 cm/s MV A velocity: 87.80 cm/s MV E/A ratio:  0.65 Oswaldo Milian MD Electronically signed by Oswaldo Milian MD Signature Date/Time: 01/10/2023/7:03:28 PM    Final (Updated)    DG Chest Port 1 View  Result Date: 01/09/2023 CLINICAL DATA:  Questionable sepsis - evaluate for abnormality EXAM: PORTABLE CHEST 1 VIEW COMPARISON:  Radiograph 03/01/2022 FINDINGS: Stable cardiac silhouette. Atherosclerotic calcification of the aorta. There is new bibasilar airspace opacity. Upper lungs clear. No pneumothorax. IMPRESSION: Bibasilar airspace opacities concerning for bilateral lower lobe pneumonia. Electronically Signed   By: Suzy Bouchard M.D.   On: 01/09/2023 11:16   EEG adult        [facility] [place]. [place] [address] [phone]      Electroencephalogram Procedure Note Mr. KAHLEE COOKSON Date of Birth:  1941-02-05 Medical Record Number:  [id number] Indications: Diagnostic Date of Procedure 01/04/2023 Medications: levetiracetam (Keppra) Clinical history : 82 year old patient being evaluated for seizures Technical Description This study was performed using 17 channel digital electroencephalographic recording equipment. International 10-20 electrode placement was used. The record was obtained with  the patient awake, drowsy, and asleep.  The record is of fair technical quality for  purposes of interpretation. Activation Procedures:  photic stimulation . EEG Description Awake: Alpha Activity: The waking state record contains a well-defined bi-occipital alpha rhythm of  moderate amplitude with a dominant frequency of 10 Hz. Reactivity is uncertain. No paroxsymal activity, spikes, or sharp waves are noted. Technical component of study is adequate. EKG tracing shows regular sinus rhythm Length of this recording is 24 minutes and 31 seconds Sleep: With drowsiness, there is attenuation of the background alpha activity. As the patient enters into light sleep, vertex waves and symmetrical spindles are noted. K complexes are noted in sleep. Transition to the waking state is unremarkable. Result of Activation Procedures: Hyperventilation: N/A. Photo Stimulation: No photic driving response is noted. Summary Normal electroencephalogram, awake, asleep and with activation procedures. There are no focal lateralizing or epileptiform features.      Subjective:  Acute events overnight, he denies any complaints, eager to go home today.   Discharge Exam: Vitals:   01/14/23 2227 01/15/23 0000  BP: 122/85 116/74  Pulse: 90 61  Resp:  20  Temp:  98.1 F (36.7 C)  SpO2:  90%   Vitals:   01/14/23 0915 01/14/23 2000 01/14/23 2227 01/15/23 0000  BP: 136/86 123/74 122/85 116/74  Pulse: 68 92 90 61  Resp: 20 18  20  $ Temp:  98 F (36.7 C)  98.1 F (36.7 C)  TempSrc:  Oral  Oral  SpO2: 90% 90%  90%  Weight:        General: Pt is alert, awake, not in acute distress Cardiovascular: RRR, S1/S2 +, no rubs, no gallops Respiratory: CTA bilaterally, no wheezing, no rhonchi Abdominal: Soft, NT, ND, bowel sounds + Extremities: no edema, no cyanosis    The results of significant diagnostics from this hospitalization (including imaging, microbiology, ancillary and laboratory) are listed below for reference.      Microbiology: Recent Results (from the past 240 hour(s))  Blood Culture (routine x 2)     Status: Abnormal   Collection Time: 01/09/23 10:05 AM   Specimen: BLOOD  Result Value Ref Range Status   Specimen Description BLOOD SITE NOT SPECIFIED  Final   Special Requests   Final    BOTTLES DRAWN AEROBIC AND ANAEROBIC Blood Culture adequate volume   Culture  Setup Time   Final    GRAM POSITIVE COCCI IN BOTH AEROBIC AND ANAEROBIC BOTTLES CRITICAL RESULT CALLED TO, READ BACK BY AND VERIFIED WITH: M PHAM,PHARMD@0615$  01/10/23 [place]    Culture (A)  Final    STREPTOCOCCUS PARASANGUINIS STAPHYLOCOCCUS EPIDERMIDIS THE SIGNIFICANCE OF ISOLATING THIS ORGANISM FROM A SINGLE SET OF BLOOD CULTURES WHEN MULTIPLE SETS ARE DRAWN IS UNCERTAIN. PLEASE NOTIFY THE MICROBIOLOGY DEPARTMENT WITHIN ONE WEEK IF SPECIATION AND SENSITIVITIES ARE REQUIRED. CRITICAL RESULT CALLED TO, READ BACK BY AND VERIFIED WITH: [place] [id number] FCP Performed at [facility], [place] [address], [place], [place] [address]    Report Status 01/12/2023 FINAL  Final  Blood Culture ID Panel (Reflexed)     Status: Abnormal   Collection Time: 01/09/23 10:05 AM  Result Value Ref Range Status   Enterococcus faecalis NOT DETECTED NOT DETECTED Final   Enterococcus Faecium NOT DETECTED NOT DETECTED Final   Listeria monocytogenes NOT DETECTED NOT DETECTED Final   Staphylococcus species NOT DETECTED NOT DETECTED Final   Staphylococcus aureus (BCID) NOT DETECTED NOT DETECTED Final   Staphylococcus epidermidis NOT DETECTED NOT DETECTED Final   Staphylococcus lugdunensis NOT DETECTED NOT DETECTED Final   Streptococcus species DETECTED (A)  NOT DETECTED Final    Comment: Not Enterococcus species, Streptococcus agalactiae, Streptococcus pyogenes, or Streptococcus pneumoniae. CRITICAL RESULT CALLED TO, READ BACK BY AND VERIFIED WITH: M PHAM,PHARMD@0615$  01/10/23 [place]    Streptococcus agalactiae NOT DETECTED NOT DETECTED Final    Streptococcus pneumoniae NOT DETECTED NOT DETECTED Final   Streptococcus pyogenes NOT DETECTED NOT DETECTED Final   A.calcoaceticus-baumannii NOT DETECTED NOT DETECTED Final   Bacteroides fragilis NOT DETECTED NOT DETECTED Final   Enterobacterales NOT DETECTED NOT DETECTED Final   Enterobacter cloacae complex NOT DETECTED NOT DETECTED Final   Escherichia coli NOT DETECTED NOT DETECTED Final   Klebsiella aerogenes NOT DETECTED NOT DETECTED Final   Klebsiella oxytoca NOT DETECTED NOT DETECTED Final   Klebsiella pneumoniae NOT DETECTED NOT DETECTED Final   Proteus species NOT DETECTED NOT DETECTED Final   Salmonella species NOT DETECTED NOT DETECTED Final   Serratia marcescens NOT DETECTED NOT DETECTED Final   Haemophilus influenzae NOT DETECTED NOT DETECTED Final   Neisseria meningitidis NOT DETECTED NOT DETECTED Final   Pseudomonas aeruginosa NOT DETECTED NOT DETECTED Final   Stenotrophomonas maltophilia NOT DETECTED NOT DETECTED Final   Candida albicans NOT DETECTED NOT DETECTED Final   Candida auris NOT DETECTED NOT DETECTED Final   Candida glabrata NOT DETECTED NOT DETECTED Final   Candida krusei NOT DETECTED NOT DETECTED Final   Candida parapsilosis NOT DETECTED NOT DETECTED Final   Candida tropicalis NOT DETECTED NOT DETECTED Final   Cryptococcus neoformans/gattii NOT DETECTED NOT DETECTED Final    Comment: Performed at [facility], [address] [place], [place] [address]  Blood Culture (routine x 2)     Status: None   Collection Time: 01/09/23 10:15 AM   Specimen: BLOOD  Result Value Ref Range Status   Specimen Description BLOOD SITE NOT SPECIFIED  Final   Special Requests   Final    BOTTLES DRAWN AEROBIC AND ANAEROBIC Blood Culture adequate volume   Culture   Final    NO GROWTH 5 DAYS Performed at [facility], [address], [place], [place] [address]    Report Status 01/14/2023 FINAL  Final  Resp panel by RT-PCR (RSV, Flu A&B, Covid) Anterior Nasal Swab      Status: Abnormal   Collection Time: 01/09/23 10:16 AM   Specimen: Anterior Nasal Swab  Result Value Ref Range Status   SARS Coronavirus 2 by RT PCR POSITIVE (A) NEGATIVE Final   Influenza A by PCR NEGATIVE NEGATIVE Final   Influenza B by PCR NEGATIVE NEGATIVE Final    Comment: (NOTE) The Xpert Xpress SARS-CoV-2/FLU/RSV plus assay is intended as an aid in the diagnosis of influenza from Nasopharyngeal swab specimens and should not be used as a sole basis for treatment. Nasal washings and aspirates are unacceptable for Xpert Xpress SARS-CoV-2/FLU/RSV testing.  Fact Sheet for Patients: EntrepreneurPulse.com.au  Fact Sheet for Healthcare Providers: IncredibleEmployment.be  This test is not yet approved or cleared by the [place] FDA and has been authorized for detection and/or diagnosis of SARS-CoV-2 by FDA under an Emergency Use Authorization (EUA). This EUA will remain in effect (meaning this test can be used) for the duration of the COVID-19 declaration under Section 564(b)(1) of the Act, 21 U.S.C. section 360bbb-3(b)(1), unless the authorization is terminated or revoked.     Resp Syncytial Virus by PCR NEGATIVE NEGATIVE Final    Comment: (NOTE) Fact Sheet for Patients: EntrepreneurPulse.com.au  Fact Sheet for Healthcare Providers: IncredibleEmployment.be  This test is not yet approved or  cleared by the [place] and has been authorized for detection and/or diagnosis of SARS-CoV-2 by FDA under an Emergency Use Authorization (EUA). This EUA will remain in effect (meaning this test can be used) for the duration of the COVID-19 declaration under Section 564(b)(1) of the Act, 21 U.S.C. section 360bbb-3(b)(1), unless the authorization is terminated or revoked.  Performed at [facility], [place] [address] [place], [place] [address]   Expectorated Sputum Assessment w Gram Stain, Rflx to  Resp Cult     Status: None   Collection Time: 01/09/23 10:41 AM   Specimen: Expectorated Sputum  Result Value Ref Range Status   Specimen Description EXPECTORATED SPUTUM  Final   Special Requests NONE  Final   Sputum evaluation   Final    THIS SPECIMEN IS ACCEPTABLE FOR SPUTUM CULTURE Performed at [facility], [place] [address], [place], [place] [address]    Report Status 01/09/2023 FINAL  Final  Culture, Respiratory w Gram Stain     Status: None   Collection Time: 01/09/23 10:41 AM  Result Value Ref Range Status   Specimen Description EXPECTORATED SPUTUM  Final   Special Requests NONE Reflexed from [id number]  Final   Gram Stain   Final    ABUNDANT WBC PRESENT, PREDOMINANTLY PMN FEW GRAM NEGATIVE RODS FEW GRAM POSITIVE COCCI IN PAIRS    Culture   Final    ABUNDANT Normal respiratory flora-no Staph aureus or Pseudomonas seen Performed at [facility], [place] [address], [place],  [address]    Report Status 01/11/2023 FINAL  Final     Labs: BNP (last 3 results) Recent Labs    01/09/23 1010  BNP [id number]*   Basic Metabolic Panel: Recent Labs  Lab 01/10/23 0655 01/11/23 0252 01/12/23 0500 01/13/23 0329 01/14/23 0645 01/15/23 0420  NA 139 135 134* 136 138 135  K 3.7 3.7 4.1 3.6 4.0 4.2  CL 110 107 107 105 107 104  CO2 20* 18* 18* 20* 22 21*  GLUCOSE 135* 150* 180* 209* 128* 176*  BUN 30* 47* 48* 43* 39* 42*  CREATININE 1.59* 2.07* 1.83* 1.71* 1.70* 1.59*  CALCIUM 8.0* 7.6* 7.6* 7.7* 7.7* 7.6*  MG 2.2 2.2 2.1 2.0 2.0  --   PHOS 2.9 2.5 2.4* 4.0 3.5  --    Liver Function Tests: Recent Labs  Lab 01/10/23 0655 01/11/23 0252 01/12/23 0500 01/13/23 0329 01/14/23 0645  AST 31 35 36 45* 38  ALT 28 25 27 $ 39 39  ALKPHOS 38 40 45 50 47  BILITOT 0.9 0.4 0.9 0.8 1.0  PROT 5.7* 5.6* 5.4* 5.8* 5.1*  ALBUMIN 2.7* 2.6* 2.6* 2.7* 2.4*   No results for input(s): "LIPASE", "AMYLASE" in the last 168 hours. No results for input(s): "AMMONIA" in the last 168  hours. CBC: Recent Labs  Lab 01/10/23 0655 01/11/23 0252 01/12/23 0500 01/13/23 0329 01/14/23 0645 01/15/23 0420  WBC 13.0* 10.8* 12.5* 12.0* 12.7* 11.6*  NEUTROABS 10.7* 9.7* 11.8* 11.2* 10.6*  --   HGB 12.0* 11.4* 11.4* 12.4* 11.8* 11.5*  HCT 35.8* 34.6* 33.9* 36.3* 34.0* 35.1*  MCV 96.0 94.8 94.7 91.9 92.1 94.4  PLT 144* 154 170 179 180 189   Cardiac Enzymes: No results for input(s): "CKTOTAL", "CKMB", "CKMBINDEX", "TROPONINI" in the last 168 hours. BNP: Invalid input(s): "POCBNP" CBG: Recent Labs  Lab 01/13/23 0755  GLUCAP 172*   D-Dimer Recent Labs    01/13/23 0329 01/14/23 0645  DDIMER 1.13* 0.94*   Hgb  A1c No results for input(s): "HGBA1C" in the last 72 hours. Lipid Profile No results for input(s): "CHOL", "HDL", "LDLCALC", "TRIG", "CHOLHDL", "LDLDIRECT" in the last 72 hours. Thyroid function studies No results for input(s): "TSH", "T4TOTAL", "T3FREE", "THYROIDAB" in the last 72 hours.  Invalid input(s): "FREET3" Anemia work up Recent Labs    01/13/23 0329 01/14/23 0645  FERRITIN 412* 352*   Urinalysis    Component Value Date/Time   COLORURINE YELLOW 01/09/2023 1400   APPEARANCEUR CLEAR 01/09/2023 1400   LABSPEC 1.013 01/09/2023 1400   PHURINE 5.0 01/09/2023 1400   GLUCOSEU NEGATIVE 01/09/2023 1400   HGBUR SMALL (A) 01/09/2023 1400   BILIRUBINUR NEGATIVE 01/09/2023 1400   KETONESUR NEGATIVE 01/09/2023 1400   PROTEINUR 30 (A) 01/09/2023 1400   NITRITE NEGATIVE 01/09/2023 1400   LEUKOCYTESUR NEGATIVE 01/09/2023 1400   Sepsis Labs Recent Labs  Lab 01/12/23 0500 01/13/23 0329 01/14/23 0645 01/15/23 0420  WBC 12.5* 12.0* 12.7* 11.6*   Microbiology Recent Results (from the past 240 hour(s))  Blood Culture (routine x 2)     Status: Abnormal   Collection Time: 01/09/23 10:05 AM   Specimen: BLOOD  Result Value Ref Range Status   Specimen Description BLOOD SITE NOT SPECIFIED  Final   Special Requests   Final    BOTTLES DRAWN AEROBIC AND  ANAEROBIC Blood Culture adequate volume   Culture  Setup Time   Final    GRAM POSITIVE COCCI IN BOTH AEROBIC AND ANAEROBIC BOTTLES CRITICAL RESULT CALLED TO, READ BACK BY AND VERIFIED WITH: M PHAM,PHARMD@0615$  01/10/23 [place]    Culture (A)  Final    STREPTOCOCCUS PARASANGUINIS STAPHYLOCOCCUS EPIDERMIDIS THE SIGNIFICANCE OF ISOLATING THIS ORGANISM FROM A SINGLE SET OF BLOOD CULTURES WHEN MULTIPLE SETS ARE DRAWN IS UNCERTAIN. PLEASE NOTIFY THE MICROBIOLOGY DEPARTMENT WITHIN ONE WEEK IF SPECIATION AND SENSITIVITIES ARE REQUIRED. CRITICAL RESULT CALLED TO, READ BACK BY AND VERIFIED WITH: Keenes [id number] FCP Performed at [facility], [place] [address] [place], [place] [address]    Report Status 01/12/2023 FINAL  Final  Blood Culture ID Panel (Reflexed)     Status: Abnormal   Collection Time: 01/09/23 10:05 AM  Result Value Ref Range Status   Enterococcus faecalis NOT DETECTED NOT DETECTED Final   Enterococcus Faecium NOT DETECTED NOT DETECTED Final   Listeria monocytogenes NOT DETECTED NOT DETECTED Final   Staphylococcus species NOT DETECTED NOT DETECTED Final   Staphylococcus aureus (BCID) NOT DETECTED NOT DETECTED Final   Staphylococcus epidermidis NOT DETECTED NOT DETECTED Final   Staphylococcus lugdunensis NOT DETECTED NOT DETECTED Final   Streptococcus species DETECTED (A) NOT DETECTED Final    Comment: Not Enterococcus species, Streptococcus agalactiae, Streptococcus pyogenes, or Streptococcus pneumoniae. CRITICAL RESULT CALLED TO, READ BACK BY AND VERIFIED WITH: M PHAM,PHARMD@0615$  01/10/23 [place]    Streptococcus agalactiae NOT DETECTED NOT DETECTED Final   Streptococcus pneumoniae NOT DETECTED NOT DETECTED Final   Streptococcus pyogenes NOT DETECTED NOT DETECTED Final   A.calcoaceticus-baumannii NOT DETECTED NOT DETECTED Final   Bacteroides fragilis NOT DETECTED NOT DETECTED Final   Enterobacterales NOT DETECTED NOT DETECTED Final   Enterobacter cloacae complex NOT  DETECTED NOT DETECTED Final   Escherichia coli NOT DETECTED NOT DETECTED Final   Klebsiella aerogenes NOT DETECTED NOT DETECTED Final   Klebsiella oxytoca NOT DETECTED NOT DETECTED Final   Klebsiella pneumoniae NOT DETECTED NOT DETECTED Final   Proteus species NOT DETECTED NOT DETECTED Final   Salmonella species NOT DETECTED NOT DETECTED Final   Serratia  marcescens NOT DETECTED NOT DETECTED Final   Haemophilus influenzae NOT DETECTED NOT DETECTED Final   Neisseria meningitidis NOT DETECTED NOT DETECTED Final   Pseudomonas aeruginosa NOT DETECTED NOT DETECTED Final   Stenotrophomonas maltophilia NOT DETECTED NOT DETECTED Final   Candida albicans NOT DETECTED NOT DETECTED Final   Candida auris NOT DETECTED NOT DETECTED Final   Candida glabrata NOT DETECTED NOT DETECTED Final   Candida krusei NOT DETECTED NOT DETECTED Final   Candida parapsilosis NOT DETECTED NOT DETECTED Final   Candida tropicalis NOT DETECTED NOT DETECTED Final   Cryptococcus neoformans/gattii NOT DETECTED NOT DETECTED Final    Comment: Performed at [facility], [place] [address] [place], [place] [address]  Blood Culture (routine x 2)     Status: None   Collection Time: 01/09/23 10:15 AM   Specimen: BLOOD  Result Value Ref Range Status   Specimen Description BLOOD SITE NOT SPECIFIED  Final   Special Requests   Final    BOTTLES DRAWN AEROBIC AND ANAEROBIC Blood Culture adequate volume   Culture   Final    NO GROWTH 5 DAYS Performed at [facility], [address] [place], [place] [address]    Report Status 01/14/2023 FINAL  Final  Resp panel by RT-PCR (RSV, Flu A&B, Covid) Anterior Nasal Swab     Status: Abnormal   Collection Time: 01/09/23 10:16 AM   Specimen: Anterior Nasal Swab  Result Value Ref Range Status   SARS Coronavirus 2 by RT PCR POSITIVE (A) NEGATIVE Final   Influenza A by PCR NEGATIVE NEGATIVE Final   Influenza B by PCR NEGATIVE NEGATIVE Final    Comment: (NOTE) The Xpert Xpress  SARS-CoV-2/FLU/RSV plus assay is intended as an aid in the diagnosis of influenza from Nasopharyngeal swab specimens and should not be used as a sole basis for treatment. Nasal washings and aspirates are unacceptable for Xpert Xpress SARS-CoV-2/FLU/RSV testing.  Fact Sheet for Patients: EntrepreneurPulse.com.au  Fact Sheet for Healthcare Providers: IncredibleEmployment.be  This test is not yet approved or cleared by the [place] FDA and has been authorized for detection and/or diagnosis of SARS-CoV-2 by FDA under an Emergency Use Authorization (EUA). This EUA will remain in effect (meaning this test can be used) for the duration of the COVID-19 declaration under Section 564(b)(1) of the Act, 21 U.S.C. section 360bbb-3(b)(1), unless the authorization is terminated or revoked.     Resp Syncytial Virus by PCR NEGATIVE NEGATIVE Final    Comment: (NOTE) Fact Sheet for Patients: EntrepreneurPulse.com.au  Fact Sheet for Healthcare Providers: IncredibleEmployment.be  This test is not yet approved or cleared by the [place] FDA and has been authorized for detection and/or diagnosis of SARS-CoV-2 by FDA under an Emergency Use Authorization (EUA). This EUA will remain in effect (meaning this test can be used) for the duration of the COVID-19 declaration under Section 564(b)(1) of the Act, 21 U.S.C. section 360bbb-3(b)(1), unless the authorization is terminated or revoked.  Performed at [facility], [place] [address], [place], [place] [address]   Expectorated Sputum Assessment w Gram Stain, Rflx to Resp Cult     Status: None   Collection Time: 01/09/23 10:41 AM   Specimen: Expectorated Sputum  Result Value Ref Range Status   Specimen Description EXPECTORATED SPUTUM  Final   Special Requests NONE  Final   Sputum evaluation   Final    THIS SPECIMEN IS ACCEPTABLE FOR SPUTUM CULTURE Performed at [facility], [place]  [address], [place], [place] [address]    Report Status 01/09/2023 FINAL  Final  Culture, Respiratory w Gram Stain     Status: None   Collection Time: 01/09/23 10:41 AM  Result Value Ref Range Status   Specimen Description EXPECTORATED SPUTUM  Final   Special Requests NONE Reflexed from [id number]  Final   Gram Stain   Final    ABUNDANT WBC PRESENT, PREDOMINANTLY PMN FEW GRAM NEGATIVE RODS FEW GRAM POSITIVE COCCI IN PAIRS    Culture   Final    ABUNDANT Normal respiratory flora-no Staph aureus or Pseudomonas seen Performed at [facility], [address], [place], [place] [address]    Report Status 01/11/2023 FINAL  Final     Time coordinating discharge: Over 30 minutes  SIGNED:   Phillips Climes, MD  [facility] 01/15/2023, 7:55 AM Pager   If 7PM-7AM, please contact night-coverage [email or web address] Password [email or web address]

## 2023-01-15 NOTE — Progress Notes (Signed)
Physical Therapy Treatment Patient Details Name: Roberto Dickson MRN: [id number] DOB: 1941/03/15 Today's Date: 01/15/2023   History of Present Illness Pt is a 82 y.o. male who presented to the emergency department with progressively worse dyspnea associated with productive cough, sore throat, rhinorrhea, chills, decreased appetite, fatigue and malaise for the past 4 days. In ED pt positive for COVID-19 and admitted for PNA. 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    PT Comments    Pt eager to d/c home, agreeable to gait with RW to assess readiness for d/c as well as O2 requirement at home. Pt needing 2LO2 to maintain SpO2 88% and greater, additionally cues for breathing technique helpful during session. Sat qualification note put in prior to his note.      Recommendations for follow up therapy are one component of a multi-disciplinary discharge planning process, led by the attending physician.  Recommendations may be updated based on patient status, additional functional criteria and insurance authorization.  Follow Up Recommendations  Outpatient PT     Assistance Recommended at Discharge Set up Supervision/Assistance  Patient can return home with the following A little help with walking and/or transfers;Assistance with cooking/housework;Assist for transportation;Help with stairs or ramp for entrance   Equipment Recommendations  None recommended by PT    Recommendations for Other Services       Precautions / Restrictions Precautions Precautions: Fall Precaution Comments: watch O2 Restrictions Weight Bearing Restrictions: No     Mobility  Bed Mobility Overal bed mobility: Needs Assistance             General bed mobility comments: OOB to recliner at start of session    Transfers Overall transfer level: Needs assistance Equipment used: Rolling walker (2 wheels) Transfers: Sit to/from  Stand Sit to Stand: Supervision           General transfer comment: for safety, PT managing lines/leads    Ambulation/Gait Ambulation/Gait assistance: Supervision Gait Distance (Feet): 400 Feet Assistive device: Rolling walker (2 wheels) Gait Pattern/deviations: Step-through pattern, Decreased stride length Gait velocity: decr     General Gait Details: cues for navigating tight quarters and obstacles in hallway, SpO2 88% and greater on 2LO2   Stairs             Wheelchair Mobility    Modified Rankin (Stroke Patients Only)       Balance Overall balance assessment: Mild deficits observed, not formally tested Sitting-balance support: Feet supported Sitting balance-Leahy Scale: Normal     Standing balance support: No upper extremity supported, Bilateral upper extremity supported, During functional activity Standing balance-Leahy Scale: Fair Standing balance comment: No overt LOB.                            Cognition Arousal/Alertness: Awake/alert Behavior During Therapy: WFL for tasks assessed/performed Overall Cognitive Status: Within Functional Limits for tasks assessed                                 General Comments: stratus interpreter [place] [phone]        Exercises      General Comments General comments (skin integrity, edema, etc.): DOE 2/4, SPO2 88% and greater on 2LO2      Pertinent Vitals/Pain Pain Assessment Pain Assessment: Faces Faces Pain Scale: No hurt Pain Intervention(s): Monitored during session  Home Living                          Prior Function            PT Goals (current goals can now be found in the care plan section) Acute Rehab PT Goals Patient Stated Goal: return home and regain strength/endurance PT Goal Formulation: With patient Time For Goal Achievement: 01/25/23 Potential to Achieve Goals: Good Progress towards PT goals: Progressing toward goals    Frequency    Min  3X/week      PT Plan Current plan remains appropriate    Co-evaluation              AM-PAC PT "6 Clicks" Mobility   Outcome Measure  Help needed turning from your back to your side while in a flat bed without using bedrails?: None Help needed moving from lying on your back to sitting on the side of a flat bed without using bedrails?: None Help needed moving to and from a bed to a chair (including a wheelchair)?: A Little Help needed standing up from a chair using your arms (e.g., wheelchair or bedside chair)?: A Little Help needed to walk in hospital room?: A Little Help needed climbing 3-5 steps with a railing? : A Little 6 Click Score: 20    End of Session Equipment Utilized During Treatment: Gait belt;Oxygen Activity Tolerance: Patient tolerated treatment well Patient left: with call bell/phone within reach;Other (comment);with nursing/sitter in room (NT outside of room and listening out for pt, pt on toilet and knows to press call button for assist post-toileting) Nurse Communication: Mobility status PT Visit Diagnosis: Muscle weakness (generalized) (M62.81);Difficulty in walking, not elsewhere classified (R26.2);Unsteadiness on feet (R26.81)     Time: 1050-1110 PT Time Calculation (min) (ACUTE ONLY): 20 min  Charges:  $Therapeutic Activity: 8-22 mins                     Stacie Glaze, PT DPT Acute Rehabilitation Services Pager [phone]  Office [phone]    Dundee E Ruffin Pyo 01/15/2023, 11:25 AM

## 2023-01-15 NOTE — TOC Transition Note (Signed)
Transition of Care [facility]) - CM/SW Discharge Note   Patient Details  Name: Roberto Dickson MRN: [id number] Date of Birth: Jan 01, 1941  [facility]) CM/SW Contact:  Pollie Friar, RN Phone Number: 01/15/2023, 11:35 AM   Clinical Narrative:    PCP: Horald Pollen  Pharmacy: [facility] pharmacy to deliver d/c medications to the bedside.  Pt is discharging home with outpatient therapy through [facility]. Information on the AVS. Walker, shower seat and oxygen to be delivered to the room per [employer]. Concentrator for the home oxygen will be delivered to the pts home.  Pt has transportation home.    Final next level of care: OP Rehab Barriers to Discharge: No Barriers Identified   Patient Goals and CMS Choice CMS Medicare.gov Compare Post Acute Care list provided to:: Patient Choice offered to / list presented to : Patient  Discharge Placement                         Discharge Plan and Services Additional resources added to the After Visit Summary for                  DME Arranged: Walker rolling, Shower stool, Oxygen DME Agency: [employer] Date DME Agency Contacted: 01/15/23   Representative spoke with at DME Agency: [place] ([place]) Interventions SDOH Screenings   Food Insecurity: No Food Insecurity (11/23/2022)  Housing: Low Risk  (11/23/2022)  Transportation Needs: No Transportation Needs (11/23/2022)  Utilities: Not At Risk (11/23/2022)  Alcohol Screen: Low Risk  (11/23/2022)  Depression (PHQ2-9): Low Risk  (11/23/2022)  Financial Resource Strain: Low Risk  (11/23/2022)  Physical Activity: Sufficiently Active (11/23/2022)  Social Connections: Socially Integrated (11/23/2022)  Stress: No Stress Concern Present (11/23/2022)  Tobacco Use: Low Risk  (01/09/2023)     Readmission Risk Interventions     No data to display

## 2023-01-15 NOTE — Progress Notes (Signed)
gned      SATURATION QUALIFICATIONS: (This note is used to comply with regulatory documentation for home oxygen)   Patient Saturations on Room Air at Rest = 90%   Patient Saturations on Room Air while Ambulating = 86%   Patient Saturations on 2 Liters of oxygen while Ambulating = 88-90%   Please briefly explain why patient needs home oxygen: to maintain SPO2 >=88% during mobility    Alexa S, PT DPT Acute Rehabilitation Services Pager [phone]  Office [phone]         Phillips Climes MD

## 2023-01-15 NOTE — Progress Notes (Signed)
ANTICOAGULATION CONSULT NOTE   Pharmacy Consult for warfarin Indication:  h/o CVA and LV thrombus  No Known Allergies  Patient Measurements: Weight: 73.5 kg (162 lb)  Vital Signs: Temp: 98 F (36.7 C) (02/12 0832) Temp Source: Oral (02/12 0832) BP: 112/74 (02/12 0832) Pulse Rate: 62 (02/12 0832)  Labs: Recent Labs    01/13/23 0329 01/14/23 0645 01/15/23 0420  HGB 12.4* 11.8* 11.5*  HCT 36.3* 34.0* 35.1*  PLT 179 180 189  LABPROT 40.4* 36.7* 34.3*  INR 4.2* 3.7* 3.4*  CREATININE 1.71* 1.70* 1.59*     Estimated Creatinine Clearance: 34.6 mL/min (A) (by C-G formula based on SCr of 1.59 mg/dL (H)).  Assessment: 82yo male c/o SOB and fever for 4 days admitted for Covid. The plan is to continue Warfarin for CVA and LV thrombus.   Patient's home regimen is 6 mg daily except 3 mg on Mondays - INR on admission was therapeutic at 2.1.   INR remains elevated at 3.4 - suspect doxycycline contributed to warfarin sensitivity and 4 doses have been held. Expect INR to be < 3 tomorrow. CBC stable.  Goal of Therapy:  INR 2-3   Plan:  Warfarin 0.5 mg PO tonight Daily INR Monitor for s/sx of bleeding  Thank you for involving pharmacy in this patient's care.  Renold Genta, PharmD, BCPS Clinical Pharmacist Clinical phone for 01/15/2023 is [phone] 01/15/2023 9:06 AM

## 2023-01-15 NOTE — Progress Notes (Signed)
SATURATION QUALIFICATIONS: (This note is used to comply with regulatory documentation for home oxygen)  Patient Saturations on Room Air at Rest = 90%  Patient Saturations on Room Air while Ambulating = 86%  Patient Saturations on 2 Liters of oxygen while Ambulating = 88-90%  Please briefly explain why patient needs home oxygen: to maintain SPO2 >=88% during mobility   Damareon Lanni S, PT DPT Acute Rehabilitation Services Pager [phone]  Office [phone]

## 2023-01-15 NOTE — Discharge Instructions (Signed)
Follow with Primary MD Horald Pollen, MD in 14 days   Get CBC, CMP, 2 view Chest X ray checked  by Primary MD next visit.    Activity: As tolerated with Full fall precautions use walker/cane & assistance as needed   Disposition Home    Diet: Heart Healthy .  On your next visit with your primary care physician please Get Medicines reviewed and adjusted.   Please request your Prim.MD to go over all Hospital Tests and Procedure/Radiological results at the follow up, please get all Hospital records sent to your Prim MD by signing hospital release before you go home.   If you experience worsening of your admission symptoms, develop shortness of breath, life threatening emergency, suicidal or homicidal thoughts you must seek medical attention immediately by calling 911 or calling your MD immediately  if symptoms less severe.  You Must read complete instructions/literature along with all the possible adverse reactions/side effects for all the Medicines you take and that have been prescribed to you. Take any new Medicines after you have completely understood and accpet all the possible adverse reactions/side effects.   Do not drive, operating heavy machinery, perform activities at heights, swimming or participation in water activities or provide baby sitting services if your were admitted for syncope or siezures until you have seen by Primary MD or a Neurologist and advised to do so again.  Do not drive when taking Pain medications.    Do not take more than prescribed Pain, Sleep and Anxiety Medications  Special Instructions: If you have smoked or chewed Tobacco  in the last 2 yrs please stop smoking, stop any regular Alcohol  and or any Recreational drug use.  Wear Seat belts while driving.   Please note  You were cared for by a hospitalist during your hospital stay. If you have any questions about your discharge medications or the care you received while you were in the  hospital after you are discharged, you can call the unit and asked to speak with the hospitalist on call if the hospitalist that took care of you is not available. Once you are discharged, your primary care physician will handle any further medical issues. Please note that NO REFILLS for any discharge medications will be authorized once you are discharged, as it is imperative that you return to your primary care physician (or establish a relationship with a primary care physician if you do not have one) for your aftercare needs so that they can reassess your need for medications and monitor your lab values.

## 2023-01-16 ENCOUNTER — Telehealth: Payer: Self-pay

## 2023-01-16 MED ORDER — TAMSULOSIN HCL 0.4 MG PO CAPS: ORAL_CAPSULE | ORAL | 1 refills | Status: DC

## 2023-01-16 NOTE — Transitions of Care (Post Inpatient/ED Visit) (Signed)
   01/16/2023  Name: Roberto Dickson MRN: [id number] DOB: Jul 16, 1941  Today's TOC FU Call Status: Today's TOC FU Call Status:: Successful TOC FU Call Competed TOC FU Call Complete Date: 01/16/23  Transition Care Management Follow-up Telephone Call Date of Discharge: 01/15/23 Discharge Facility: [facility] Type of Discharge: Inpatient Admission Primary Inpatient Discharge Diagnosis:: Acute respiratory failure with hypoxia How have you been since you were released from the hospital?: Better Any questions or concerns?: No  Items Reviewed: Did you receive and understand the discharge instructions provided?: Yes Medications obtained and verified?: Yes (Medications Reviewed) Any new allergies since your discharge?: No Dietary orders reviewed?: Yes Do you have support at home?: Yes  Home Care and Equipment/Supplies: [place] Ordered?: NA Any new equipment or medical supplies ordered?: Yes Name of Medical supply agency?: unsure of name Were you able to get the equipment/medical supplies?: Yes Do you have any questions related to the use of the equipment/supplies?: No  Functional Questionnaire: Do you need assistance with bathing/showering or dressing?: No Do you need assistance with meal preparation?: No Do you need assistance with eating?: No Do you have difficulty maintaining continence: No Do you need assistance with getting out of bed/getting out of a chair/moving?: No Do you have difficulty managing or taking your medications?: No  Folllow up appointments reviewed: PCP Follow-up appointment confirmed?: Yes Date of PCP follow-up appointment?: 01/25/23 Follow-up Provider: Dr Mitchel [facility] Follow-up appointment confirmed?: Yes Date of Specialist follow-up appointment?: 01/24/23 Follow-Up Specialty Provider:: Dr Marilynn Rail Do you need transportation to your follow-up appointment?: No Do you understand care options if your condition(s) worsen?: Yes-patient  verbalized understanding    [place] Direct Dial [phone]

## 2023-01-17 ENCOUNTER — Other Ambulatory Visit: Payer: Self-pay | Admitting: Emergency Medicine

## 2023-01-24 ENCOUNTER — Encounter: Payer: Self-pay | Admitting: Cardiology

## 2023-01-24 ENCOUNTER — Ambulatory Visit: Payer: Medicare Other | Attending: General Practice | Admitting: Cardiology

## 2023-01-24 ENCOUNTER — Ambulatory Visit (INDEPENDENT_AMBULATORY_CARE_PROVIDER_SITE_OTHER): Payer: Medicare Other

## 2023-01-24 VITALS — BP 124/70 | HR 72 | Ht 67.0 in | Wt 177.4 lb

## 2023-01-24 DIAGNOSIS — I63512 Cerebral infarction due to unspecified occlusion or stenosis of left middle cerebral artery: Secondary | ICD-10-CM

## 2023-01-24 DIAGNOSIS — I5022 Chronic systolic (congestive) heart failure: Secondary | ICD-10-CM

## 2023-01-24 DIAGNOSIS — Z8673 Personal history of transient ischemic attack (TIA), and cerebral infarction without residual deficits: Secondary | ICD-10-CM | POA: Diagnosis not present

## 2023-01-24 DIAGNOSIS — I77819 Aortic ectasia, unspecified site: Secondary | ICD-10-CM

## 2023-01-24 DIAGNOSIS — I25118 Atherosclerotic heart disease of native coronary artery with other forms of angina pectoris: Secondary | ICD-10-CM | POA: Diagnosis not present

## 2023-01-24 DIAGNOSIS — I513 Intracardiac thrombosis, not elsewhere classified: Secondary | ICD-10-CM

## 2023-01-24 DIAGNOSIS — I10 Essential (primary) hypertension: Secondary | ICD-10-CM | POA: Diagnosis present

## 2023-01-24 DIAGNOSIS — D6859 Other primary thrombophilia: Secondary | ICD-10-CM

## 2023-01-24 LAB — POCT INR: INR: 5.3 — AB (ref 2.0–3.0)

## 2023-01-24 MED ORDER — FUROSEMIDE 20 MG PO TABS: 20.0000 mg | ORAL_TABLET | Freq: Every day | ORAL | 0 refills | Status: DC | PRN

## 2023-01-24 NOTE — Progress Notes (Signed)
Cardiology Office Note:    Date:  01/24/2023   ID:  Roberto Dickson, DOB Mar 17, 1941, MRN [id number]  PCP:  Horald Pollen, [facility] Cardiologist:  Buford Dresser, MD     Referring MD: Horald Pollen, *   No chief complaint on file.   History of Present Illness:    Roberto Dickson is a 82 y.o. 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  He initially established care with our office in 2022 following a recent hospital admission. He was admitted 07/26/2021 - 08/10/2021 with acute ischemic left middle cerebral artery (MCA) stroke. He received tPA and additionally underwent cerebral angiography with revascularization of left ICA from proximal to distal terminus and origin of left MCA. Echocardiogram with LVEF 50 to 55% with apical thrombus and Coumadin started. There were wall motion abnormalities noted by echocardiogram but this was recommended for outpatient duration.  Due to the wall motion abnormalities noted on the echo, the decision was made to proceed with a [place] which ultimately showed previous scar with ischemia, high risk study.  He was then sent for cardiac CTA which showed significant three-vessel disease, the decision was made to proceed with a cardiac catheterization.  Left heart cath revealed severe triple-vessel disease, chronic total occlusion of the mid LAD, distal LAD diffusely diseased and fills from left to left collaterals, he was noted to have LV thrombus by recent echo and no suitable targets for PCI were found.  CT surgery was considered however since his LAD target was poor medical management was recommended.   Admitted to 01/09/2023 to 01/15/2023 for acute hypoxic respiratory failure secondary to COVID-19 pneumonia, requiring oxygen up to 12 L HFNC, he received Actemra, remdesivir, Solu-Medrol, Rocephin, doxycycline. He was eventually discharged back home with  home O2.   He presents today accompanied by medical interpreter and by his daughter-in-law.  He states overall he is feeling better physically and mentally. He denies chest pain, palpitations, dyspnea, pnd, orthopnea, n, v, dizziness, syncope, weight gain, or early satiety.  We discussed his heart failure, he does not have access to a scale in his home and is not interested in weighing every day.  He has noticed that his ankles are more swollen than normal.  Denies hematuria, hematochezia, or hemoptysis.  Past Medical History:  Diagnosis Date   Hypertension     Past Surgical History:  Procedure Laterality Date   APPENDECTOMY     in his 36's   CORONARY ANGIOGRAPHY N/A 10/20/2021   Procedure: CORONARY ANGIOGRAPHY;  Surgeon: Burnell Blanks, MD;  Location: [facility];  Service: Cardiovascular;  Laterality: N/A;   IR CT HEAD LTD  07/19/2021   IR FLUORO GUIDED NEEDLE PLC ASPIRATION/INJECTION LOC  07/21/2021   IR PERCUTANEOUS ART THROMBECTOMY/INFUSION INTRACRANIAL INC DIAG ANGIO  07/19/2021   RADIOLOGY WITH ANESTHESIA N/A 07/19/2021   Procedure: IR WITH ANESTHESIA;  Surgeon: Luanne Bras, MD;  Location: [facility];  Service: Radiology;  Laterality: N/A;    Current Medications: Current Meds  Medication Sig   acetaminophen (TYLENOL) 325 MG tablet Take 1-2 tablets (325-650 mg total) by mouth every 4 (four) hours as needed for mild pain. (Patient taking differently: Take 325 mg by mouth daily as needed for mild pain.)   amLODipine (NORVASC) 5 MG tablet Take 1 tablet (5 mg total) by mouth daily.   aspirin EC (ASPIRIN LOW DOSE) 81 MG tablet Take 1  tablet (81 mg total) by mouth daily. Swallow whole.   atorvastatin (LIPITOR) 80 MG tablet Take 1 tablet (80 mg total) by mouth daily.   docusate sodium (COLACE) 100 MG capsule Take 1 capsule (100 mg total) by mouth 2 (two) times daily. (Patient taking differently: Take 200 mg by mouth every other day.)   furosemide (LASIX) 20 MG tablet  Take 1 tablet (20 mg total) by mouth daily as needed for edema (PEDAL EDEMA). TAKE 20MG X 2DAYS THEN TAKE AS NEEDED FOR PEDAL EDEMA   melatonin 3 MG TABS tablet Take 0.5 tablets (1.5 mg total) by mouth at bedtime. OVER THE COUNTER (Patient taking differently: Take 1.5 mg by mouth at bedtime.)   metoprolol succinate (TOPROL-XL) 50 MG 24 hr tablet TAKE 1 TABLET(50 MG) BY MOUTH AT BEDTIME (Patient taking differently: Take 50 mg by mouth at bedtime.)   pantoprazole (PROTONIX) 40 MG tablet TAKE 1 TABLET(40 MG) BY MOUTH AT BEDTIME (Patient taking differently: Take 40 mg by mouth at bedtime.)   tamsulosin (FLOMAX) 0.4 MG CAPS capsule TAKE 1 CAPSULE BY MOUTH DAILY   tiZANidine (ZANAFLEX) 2 MG tablet TAKE 1 TABLET BY MOUTH AT BEDTIME AS NEEDED FOR MUSCLE SPASMS   warfarin (COUMADIN) 6 MG tablet TAKE 1 TABLET BY MOUTH DAILY AS DIRECTED BY COAGULATION CLINIC - Hold warfarin dose 01/15/23. Take warfarin 3 mg on 01/16/23 then resume home dosing     Allergies:   Patient has no known allergies.   Social History   Socioeconomic History   Marital status: Married    Spouse name: Not on file   Number of children: Not on file   Years of education: Not on file   Highest education level: Not on file  Occupational History   Not on file  Tobacco Use   Smoking status: Never   Smokeless tobacco: Never  Vaping Use   Vaping Use: Never used  Substance and Sexual Activity   Alcohol use: Not Currently    Alcohol/week: 1.0 standard drink of alcohol    Types: 1 Cans of beer per week   Drug use: Never   Sexual activity: Not on file  Other Topics Concern   Not on file  Social History Narrative   Not on file   Social Determinants of Health   Financial Resource Strain: Low Risk  (11/23/2022)   Overall Financial Resource Strain (CARDIA)    Difficulty of Paying Living Expenses: Not hard at all  Food Insecurity: No Food Insecurity (11/23/2022)   Hunger Vital Sign    Worried About Running Out of Food in the Last  Year: Never true    [place] in the Last Year: Never true  Transportation Needs: No Transportation Needs (11/23/2022)   PRAPARE - Hydrologist (Medical): No    Lack of Transportation (Non-Medical): No  Physical Activity: Sufficiently Active (11/23/2022)   Exercise Vital Sign    Days of Exercise per Week: 5 days    Minutes of Exercise per Session: 30 min  Stress: No Stress Concern Present (11/23/2022)   [place]    Feeling of Stress : Not at all  Social Connections: [place] (11/23/2022)   Social Connection and Isolation Panel [NHANES]    Frequency of Communication with Friends and Family: More than three times a week    Frequency of Social Gatherings with Friends and Family: More than three times a week    Attends Religious  Services: More than 4 times per year    Active Member of Clubs or Organizations: Yes    Attends Archivist Meetings: More than 4 times per year    Marital Status: Married     Family History: The patient's family history includes Cancer - Prostate in his father.  ROS:   Please see the history of present illness.    All other systems reviewed and are negative.  EKGs/Labs/Other Studies Reviewed:    The following studies were reviewed today:   EKG:  EKG is not ordered today.    Recent Labs: 01/09/2023: B Natriuretic Peptide 195.7 01/14/2023: ALT 39; Magnesium 2.0 01/15/2023: BUN 42; Creatinine, Ser 1.59; Hemoglobin 11.5; Platelets 189; Potassium 4.2; Sodium 135  Recent Lipid Panel    Component Value Date/Time   CHOL 128 10/13/2021 0000   TRIG 134 10/13/2021 0000   HDL 30 (L) 10/13/2021 0000   CHOLHDL 4.3 10/13/2021 0000   CHOLHDL 6.0 07/20/2021 0536   VLDL 17 07/20/2021 0536   LDLCALC 74 10/13/2021 0000     Risk Assessment/Calculations:                Physical Exam:    VS:  BP 124/70   Pulse 72   Ht 5' 7"$  (1.702 m)    Wt 177 lb 6.4 oz (80.5 kg)   SpO2 96%   BMI 27.78 kg/m     Wt Readings from Last 3 Encounters:  01/24/23 177 lb 6.4 oz (80.5 kg)  01/09/23 162 lb (73.5 kg)  11/30/22 174 lb (78.9 kg)     GEN:  Well nourished, well developed in no acute distress HEENT: Normal NECK: No JVD; No carotid bruits LYMPHATICS: No lymphadenopathy CARDIAC: irregular rated noted , no murmurs, rubs, gallops RESPIRATORY:  Clear to auscultation without wheezing or rhonchi, trace rales noted. ABDOMEN: Soft, non-tender, non-distended MUSCULOSKELETAL:  +2 pitting edema to mid tibia; No deformity  SKIN: Warm and dry NEUROLOGIC:  Alert and oriented x 3 PSYCHIATRIC:  Normal affect   ASSESSMENT:    1. Chronic systolic heart failure ([place])   2. History of CVA (cerebrovascular accident)   3. Hypercoagulable state ([place])   4. Coronary artery disease of native artery of native heart with stable angina pectoris ([place])   5. Essential hypertension   6. LV (left ventricular) mural thrombus   7. Aortic dilatation (HCC)    PLAN:    In order of problems listed above:  Chronic systolic heart failure -echo on 01/10/2023 reveals EF 45 to 50%, + RWMA, moderate concentric LVH.  NYHA class I-II today, he is volume overloaded but asymptomatic.  Will start Lasix 20 mg p.o. x 2 days, then as needed as needed for worsening pedal edema (he does not have a scale at home and his daughter-in-law states he would not be able to work a scale).  Continue metoprolol.  Will repeat BMET, CBC today.  He will see his PCP on 2/23 for hospital follow-up visit. CAD - Stable with no anginal symptoms. No indication for ischemic evaluation.  Continue metoprolol, aspirin, Lipitor. Hypertension -pressure today 124/70, currently well-controlled, continue with current antihypertensive regimen. History of CVA -currently anticoagulated on warfarin.  History of LV thrombus -Per most recent echo it appears resolved, anticoagulated on warfarin. Aortic dilatation  -noted on most recent echo on 01/10/2023, 42 mm, repeat imaging in one year.   Disposition-check BMET, CBC, take Lasix 20 mg p.o. for the next 2 mornings, then as needed for worsening pedal edema.  Return in 3 weeks with Laurann Montana.            Medication Adjustments/Labs and Tests Ordered: Current medicines are reviewed at length with the patient today.  Concerns regarding medicines are outlined above.  Orders Placed This Encounter  Procedures   CBC   Basic metabolic panel   Meds ordered this encounter  Medications   furosemide (LASIX) 20 MG tablet    Sig: Take 1 tablet (20 mg total) by mouth daily as needed for edema (PEDAL EDEMA). TAKE 20MG X 2DAYS THEN TAKE AS NEEDED FOR PEDAL EDEMA    Dispense:  30 tablet    Refill:  0    Patient Instructions  Medication Instructions:  TAKE FUROSEMIDE 20MG FOR 2 DAYS THEN STOP AND TAKE AS NEEDED FOR SWELLING-LOWER LEGS *If you need a refill on your cardiac medications before your next appointment, please call your pharmacy*  Lab Work: CBC AND BMET TODAY If you have labs (blood work) drawn today and your tests are completely normal, you will receive your results only by: MyChart Message (if you have MyChart) OR A paper copy in the mail If you have any lab test that is abnormal or we need to change your treatment, we will call you to review the results.  Testing/Procedures: NONE  Follow-Up: At [facility], you and your health needs are our priority.  As part of our continuing mission to provide you with exceptional heart care, we have created designated Provider Care Teams.  These Care Teams include your primary Cardiologist (physician) and Advanced Practice Providers (APPs -  Physician Assistants and Nurse Practitioners) who all work together to provide you with the care you need, when you need it.  Your next appointment:   3 week(s)  Provider:   Laurann Montana, NP    Other Instructions     Signed, Trudi Ida,  NP  01/24/2023 4:38 PM    [place]

## 2023-01-24 NOTE — Patient Instructions (Signed)
Description   Hold Warfarin today and tomorrow and then continue taking Warfarin 1 tablet daily except 1/2 tablet on Tuesdays and Fridays.  Recheck INR in 1 week.  Coumadin Clinic [phone]

## 2023-01-24 NOTE — Patient Instructions (Signed)
Medication Instructions:  TAKE FUROSEMIDE 20MG FOR 2 DAYS THEN STOP AND TAKE AS NEEDED FOR SWELLING-LOWER LEGS *If you need a refill on your cardiac medications before your next appointment, please call your pharmacy*  Lab Work: CBC AND BMET TODAY If you have labs (blood work) drawn today and your tests are completely normal, you will receive your results only by: [place] (if you have MyChart) OR A paper copy in the mail If you have any lab test that is abnormal or we need to change your treatment, we will call you to review the results.  Testing/Procedures: NONE  Follow-Up: At [facility], you and your health needs are our priority.  As part of our continuing mission to provide you with exceptional heart care, we have created designated Provider Care Teams.  These Care Teams include your primary Cardiologist (physician) and Advanced Practice Providers (APPs -  Physician Assistants and Nurse Practitioners) who all work together to provide you with the care you need, when you need it.  Your next appointment:   3 week(s)  Provider:   Laurann Montana, NP    Other Instructions

## 2023-01-25 ENCOUNTER — Ambulatory Visit (INDEPENDENT_AMBULATORY_CARE_PROVIDER_SITE_OTHER): Payer: Medicare Other | Admitting: Emergency Medicine

## 2023-01-25 ENCOUNTER — Encounter: Payer: Self-pay | Admitting: Emergency Medicine

## 2023-01-25 VITALS — BP 106/68 | HR 73 | Temp 98.4°F | Ht 67.0 in | Wt 176.1 lb

## 2023-01-25 DIAGNOSIS — U071 COVID-19: Secondary | ICD-10-CM | POA: Diagnosis not present

## 2023-01-25 DIAGNOSIS — Z8673 Personal history of transient ischemic attack (TIA), and cerebral infarction without residual deficits: Secondary | ICD-10-CM

## 2023-01-25 DIAGNOSIS — M25552 Pain in left hip: Secondary | ICD-10-CM

## 2023-01-25 DIAGNOSIS — M25551 Pain in right hip: Secondary | ICD-10-CM

## 2023-01-25 DIAGNOSIS — I10 Essential (primary) hypertension: Secondary | ICD-10-CM

## 2023-01-25 DIAGNOSIS — I513 Intracardiac thrombosis, not elsewhere classified: Secondary | ICD-10-CM | POA: Diagnosis not present

## 2023-01-25 DIAGNOSIS — I63512 Cerebral infarction due to unspecified occlusion or stenosis of left middle cerebral artery: Secondary | ICD-10-CM

## 2023-01-25 DIAGNOSIS — Z09 Encounter for follow-up examination after completed treatment for conditions other than malignant neoplasm: Secondary | ICD-10-CM | POA: Diagnosis not present

## 2023-01-25 DIAGNOSIS — J1282 Pneumonia due to coronavirus disease 2019: Secondary | ICD-10-CM

## 2023-01-25 HISTORY — DX: Pain in right hip: M25.551

## 2023-01-25 LAB — BASIC METABOLIC PANEL WITH GFR
BUN/Creatinine Ratio: 24 (ref 10–24)
BUN: 38 mg/dL — ABNORMAL HIGH (ref 8–27)
CO2: 22 mmol/L (ref 20–29)
Calcium: 7.9 mg/dL — ABNORMAL LOW (ref 8.6–10.2)
Chloride: 105 mmol/L (ref 96–106)
Creatinine, Ser: 1.56 mg/dL — ABNORMAL HIGH (ref 0.76–1.27)
Glucose: 75 mg/dL (ref 70–99)
Potassium: 4.7 mmol/L (ref 3.5–5.2)
Sodium: 140 mmol/L (ref 134–144)
eGFR: 45 mL/min/1.73 — ABNORMAL LOW

## 2023-01-25 LAB — CBC
Hematocrit: 35.7 % — ABNORMAL LOW (ref 37.5–51.0)
Hemoglobin: 12.2 g/dL — ABNORMAL LOW (ref 13.0–17.7)
MCH: 31.9 pg (ref 26.6–33.0)
MCHC: 34.2 g/dL (ref 31.5–35.7)
MCV: 93 fL (ref 79–97)
Platelets: 167 x10E3/uL (ref 150–450)
RBC: 3.83 x10E6/uL — ABNORMAL LOW (ref 4.14–5.80)
RDW: 13.9 % (ref 11.6–15.4)
WBC: 6 x10E3/uL (ref 3.4–10.8)

## 2023-01-25 NOTE — Assessment & Plan Note (Signed)
Well-controlled hypertension. Continues atorvastatin 80 mg daily No history of diabetes

## 2023-01-25 NOTE — Assessment & Plan Note (Signed)
Stable.  Continues warfarin 6 mg daily as directed by coagulation clinic

## 2023-01-25 NOTE — Assessment & Plan Note (Signed)
Well-controlled hypertension. Continue amlodipine 5 mg and metoprolol succinate 50 mg daily. BP Readings from Last 3 Encounters:  01/25/23 106/68  01/24/23 124/70  01/15/23 112/74

## 2023-01-25 NOTE — Patient Instructions (Signed)
Mantenimiento de la salud despus de los 74 aos de edad Health Maintenance After Age 80 Despus de los 65 aos de edad, corre un riesgo mayor de Tourist information centre manager enfermedades e infecciones a Barrister's clerk, como tambin de sufrir lesiones por cadas. Las cadas son la causa principal de las fracturas de huesos y lesiones en la cabeza de personas mayores de 53 aos de edad. Recibir cuidados preventivos de forma regular puede ayudarlo a mantenerse saludable y en buen [place]. Los cuidados preventivos incluyen realizarse anlisis de forma regular y Actor en el estilo de vida segn las recomendaciones del mdico. Converse con el mdico sobre lo siguiente: Las pruebas de deteccin y los anlisis que debe Dispensing optician. Una prueba de deteccin es un estudio que se para Hydrographic surveyor la presencia de una enfermedad cuando no tiene sntomas. Un plan de dieta y ejercicios adecuado para usted. Qu debo saber sobre las pruebas de deteccin y los anlisis para prevenir cadas? Realizarse pruebas de deteccin y [employer] es la mejor manera de Hydrographic surveyor un problema de salud de forma temprana. El diagnstico y tratamiento tempranos le brindan la mejor oportunidad de Chief Technology Officer las afecciones mdicas que son comunes despus de los 60 aos de edad. Ciertas afecciones y elecciones de estilo de vida pueden hacer que sea ms propenso a sufrir Engineer, manufacturing. El mdico puede recomendarle lo siguiente: Controles regulares de la visin. Una visin deficiente y afecciones como las cataratas pueden hacer que sea ms propenso a sufrir Engineer, manufacturing. Si [place] lentes, asegrese de obtener una receta actualizada si su visin cambia. Revisin de medicamentos. Revise regularmente con el mdico todos los medicamentos que toma, incluidos los medicamentos de [place]. Consulte al [employer] efectos secundarios que pueden hacer que sea ms propenso a sufrir Engineer, manufacturing. Informe al mdico si alguno de los medicamentos que toma lo hace sentir mareado o  somnoliento. Controles de fuerza y equilibrio. El mdico puede recomendar ciertos estudios para controlar su fuerza y equilibrio al estar de pie, al caminar o al cambiar de posicin. Examen de los pies. El dolor y Chiropractor en los pies, como tambin no utilizar el calzado [place], pueden hacer que sea ms propenso a sufrir Engineer, manufacturing. Pruebas de deteccin, que incluyen las siguientes: Pruebas de deteccin para la osteoporosis. La osteoporosis es una afeccin que hace que los huesos se tornen ms dbiles y se quiebren con ms facilidad. Pruebas de deteccin para la presin arterial. Los cambios en la presin arterial y los medicamentos para Chief Technology Officer la presin arterial pueden hacerlo sentir mareado. Prueba de deteccin de la depresin. Es ms probable que sufra una cada si tiene miedo a caerse, se siente deprimido o se siente incapaz de Patent examiner. Prueba de deteccin de consumo de alcohol. Beber demasiado alcohol puede afectar su equilibrio y puede hacer que sea ms propenso a sufrir Engineer, manufacturing. Siga estas indicaciones en su casa: Estilo de vida No beba alcohol si: Su mdico le indica no hacerlo. Si bebe alcohol: Limite la cantidad que bebe a lo siguiente: De 0 a 1 medida por da para las mujeres. De 0 a 2 medidas por da para los hombres. Sepa cunta cantidad de alcohol hay en las bebidas que toma. En los [place], una medida equivale a una botella de cerveza de 12 oz (355 ml), un vaso de vino de 5 oz (148 ml) o un vaso de una bebida alcohlica de alta graduacin de 1 oz (44 ml). No consuma ningn producto que  contenga nicotina o tabaco. Estos productos incluyen cigarrillos, tabaco para Higher education careers adviser y aparatos de vapeo, como los Psychologist, sport and exercise. Si necesita ayuda para dejar de consumir estos productos, consulte al [employer]. Actividad  Siga un programa de ejercicio regular para mantenerse en forma. Esto lo ayudar a Contractor equilibrio. Consulte al  mdico qu tipos de ejercicios son adecuados para usted. Si necesita un bastn o un andador, selo segn las recomendaciones del mdico. Utilice calzado con buen apoyo y suela antideslizante. Seguridad  Retire los [place] puedan causar tropiezos tales como alfombras, cables u obstculos. Instale equipos de seguridad, como barras para sostn en los baos y barandas de seguridad en las escaleras. [place] habitaciones y los pasillos bien iluminados. Indicaciones generales Hable con el mdico sobre sus riesgos de sufrir una cada. Infrmele a su mdico si: Se cae. Asegrese de informarle a su mdico acerca de todas las cadas, incluso aquellas que parecen ser [employer] Se siente mareado, cansado (tiene fatiga) o siente que pierde el equilibrio. Use los medicamentos de venta libre y los recetados solamente como se lo haya indicado el mdico. Estos incluyen suplementos. Siga una dieta sana y [place] un peso saludable. Una dieta saludable incluye productos lcteos descremados, carnes bajas en contenido de grasa (Cash), fibra de granos enteros, frijoles y [place] frutas y verduras. [place]. Realcese los estudios de rutina de la salud, dentales y de Public librarian. Resumen Tener un estilo de vida saludable y recibir cuidados preventivos pueden ayudar a Theatre stage manager salud y el bienestar despus de los 19 aos de [place]. Realizarse pruebas de deteccin y [employer] es la mejor manera de Hydrographic surveyor un problema de salud de forma temprana y [place]rena Simmonds a Product/process development scientist una cada. El diagnstico y tratamiento tempranos le brindan la mejor oportunidad de Chief Technology Officer las afecciones mdicas ms comunes en las personas mayores de 46 aos de edad. Las cadas son la causa principal de las fracturas de huesos y lesiones en la cabeza de personas mayores de 4 aos de edad. Tome precauciones para evitar una cada en su casa. Trabaje con el mdico para saber qu cambios que puede hacer para mejorar su salud y  [place], y [place]. Esta informacin no tiene Marine scientist el consejo del mdico. Asegrese de hacerle al mdico cualquier pregunta que tenga. Document Revised: 04/27/2021 Document Reviewed: 04/27/2021 Elsevier Patient Education  [place].

## 2023-01-25 NOTE — Assessment & Plan Note (Signed)
Improved with physical activity.  Encouraged to walk around the house.  May take Tylenol for pain.  Must avoid NSAIDs.

## 2023-01-25 NOTE — Progress Notes (Signed)
Roberto Dickson 82 y.o.   Chief Complaint  Patient presents with   Hospitalization Follow-up    Pt was admitted 2/6 for acute respiratory failure, patient has been doing better since being d/c. Has not been using the oxygen at home, still having some swelling,    Bil hip pain     HISTORY OF PRESENT ILLNESS: This is a 82 y.o. male here for hospital discharge follow-up when he was admitted on 01/09/2023 with COVID-pneumonia and acute respiratory failure.  Discharged on 01/15/2023 in stable condition.  Continues to improve.  No longer needing to use oxygen at home.  Also complaining of intermittent bilateral hip pain which gets better when he walks around. No other complaints or medical concerns today. Discharge summary as follows: Physician Discharge Summary  Roberto Dickson [id number] DOB: 1941/01/22 DOA: 01/09/2023   PCP: Horald Pollen, MD   Admit date: 01/09/2023 Discharge date: 01/15/2023   Admitted From: (Home) Disposition:  (Home)   Recommendations for Outpatient Follow-up:  Follow up with PCP in 2 weeks Please obtain BMP/CBC in one week Please repeat two-view chest x-ray in 2 weeks     Home Health: (YES) Equipment/Devices: (oxygen)   Discharge Condition: (Stable) CODE STATUS: (FULL) Diet recommendation: Heart Healthy    Brief/Interim Summary:   Roberto Dickson is a 82 y.o. 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  His workup significant for COVID-19 pneumonia, with significant hypoxia, admitted for further workup.       Acute respiratory failure with hypoxia (HCC) Pneumonia due to COVID-19 virus  -Patient with significant respiratory failure,  severe COVID-19 infection, significantly elevated inflammatory markers .  Significant oxygen requirement up to 12 L high flow nasal cannula, he received Actemra 2/7, treated with IV remdesivir x 3 days, he was kept on IV Solu-Medrol, respiratory status much improved after receiving Actemra, he is down to 2 to 3 L nasal cannula with activity, he will be discharged with oxygen and prednisone taper on discharge. -Elevated procalcitonin, treated with IV Rocephin and doxycycline for CAP, no further antibiotics needed at time of discharge -Patient was encouraged to use incentive spirometer and flutter valve , he was instructed to using at home.   2/4 blood cultures positive for Staphylococcus epidermidis and Streptococcus parasanguinous -This is secondary to contamination as discussed with ID   Elevated troponins - cardiology  input greatly appreciated, likely due to demand ischemia. -Continue with aspirin     Essential hypertension Continue with home medications     Left ventricular thrombus Continue warfarin     CAD (coronary artery disease) Continue atorvastatin, beta-blocker and warfarin.     Hyperlipidemia Continue atorvastatin 80 mg p.o. daily.     History of seizure Continue Keppra 500 mg p.o. twice daily.       Discharge Diagnoses:  Principal Problem:   Acute respiratory failure with hypoxia (HCC) Active Problems:   Essential hypertension   Left ventricular thrombus   CAD (coronary artery disease)   Hyperlipidemia   History of seizure   Pneumonia due to COVID-19 virus   Hypomagnesemia      HPI   Prior to Admission medications   Medication Sig Start Date End Date Taking? Authorizing Provider  acetaminophen (TYLENOL) 325 MG tablet Take 1-2  tablets (325-650 mg total) by mouth every 4 (four) hours as needed for mild pain. Patient taking differently: Take 325 mg by mouth daily as needed for mild pain. 08/10/21  Yes Love, Ivan Anchors, PA-C  amLODipine (NORVASC) 5 MG tablet  Take 1 tablet (5 mg total) by mouth daily. 02/27/22  Yes Davied Nocito, Ines Bloomer, MD  aspirin EC (ASPIRIN LOW DOSE) 81 MG tablet Take 1 tablet (81 mg total) by mouth daily. Swallow whole. 10/19/22  Yes Buford Dresser, MD  atorvastatin (LIPITOR) 80 MG tablet Take 1 tablet (80 mg total) by mouth daily. 02/09/22  Yes Johndavid Geralds, Ines Bloomer, MD  docusate sodium (COLACE) 100 MG capsule Take 1 capsule (100 mg total) by mouth 2 (two) times daily. Patient taking differently: Take 200 mg by mouth every other day. 08/10/21  Yes Love, Ivan Anchors, PA-C  furosemide (LASIX) 20 MG tablet Take 1 tablet (20 mg total) by mouth daily as needed for edema (PEDAL EDEMA). TAKE 20MG X 2DAYS THEN TAKE AS NEEDED FOR PEDAL EDEMA 01/24/23 02/23/23 Yes Venia Carbon C, NP  melatonin 3 MG TABS tablet Take 0.5 tablets (1.5 mg total) by mouth at bedtime. OVER THE COUNTER Patient taking differently: Take 1.5 mg by mouth at bedtime. 08/10/21  Yes Love, Ivan Anchors, PA-C  metoprolol succinate (TOPROL-XL) 50 MG 24 hr tablet TAKE 1 TABLET(50 MG) BY MOUTH AT BEDTIME Patient taking differently: Take 50 mg by mouth at bedtime. 11/20/22  Yes Buford Dresser, MD  pantoprazole (PROTONIX) 40 MG tablet TAKE 1 TABLET(40 MG) BY MOUTH AT BEDTIME Patient taking differently: Take 40 mg by mouth at bedtime. 08/20/22  Yes Horald Pollen, MD  tamsulosin (FLOMAX) 0.4 MG CAPS capsule TAKE 1 CAPSULE BY MOUTH DAILY 01/18/23  Yes Amad Mau, Ines Bloomer, MD  tiZANidine (ZANAFLEX) 2 MG tablet TAKE 1 TABLET BY MOUTH AT BEDTIME AS NEEDED FOR MUSCLE SPASMS 01/18/23  Yes Garold Sheeler, Ines Bloomer, MD  warfarin (COUMADIN) 6 MG tablet TAKE 1 TABLET BY MOUTH DAILY AS DIRECTED BY COAGULATION CLINIC - Hold warfarin dose 01/15/23. 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/12/2021   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   Sleep disturbance    Essential hypertension     Past Medical History:  Diagnosis Date   Hypertension     Past Surgical History:  Procedure Laterality Date   APPENDECTOMY     in his 10's   CORONARY ANGIOGRAPHY N/A 10/20/2021   Procedure: CORONARY ANGIOGRAPHY;  Surgeon: Burnell Blanks, MD;  Location: [facility];  Service: Cardiovascular;  Laterality: N/A;   IR CT HEAD LTD  07/19/2021   IR FLUORO GUIDED NEEDLE PLC ASPIRATION/INJECTION LOC  07/21/2021   IR PERCUTANEOUS ART THROMBECTOMY/INFUSION INTRACRANIAL INC DIAG ANGIO  07/19/2021   RADIOLOGY WITH ANESTHESIA N/A 07/19/2021   Procedure: IR WITH ANESTHESIA;  Surgeon: Luanne Bras, MD;  Location: [facility];  Service: Radiology;  Laterality: N/A;    Social History   Socioeconomic History   Marital status: Married  Spouse name: Not on file   Number of children: Not on file   Years of education: Not on file   Highest education level: Not on file  Occupational History   Not on file  Tobacco Use   Smoking status: Never   Smokeless tobacco: Never  Vaping Use   Vaping Use: Never used  Substance and Sexual Activity   Alcohol use: Not Currently    Alcohol/week: 1.0 standard drink of alcohol    Types: 1 Cans of beer per week   Drug use: Never   Sexual activity: Not on file  Other Topics Concern   Not on file  Social History Narrative   Not on file   Social Determinants of Health   Financial Resource Strain: Low Risk  (11/23/2022)   Overall Financial Resource Strain (CARDIA)    Difficulty of Paying Living Expenses: Not hard at all  Food  Insecurity: No Food Insecurity (11/23/2022)   Hunger Vital Sign    Worried About Running Out of Food in the Last Year: Never true    [place] in the Last Year: Never true  Transportation Needs: No Transportation Needs (11/23/2022)   PRAPARE - Hydrologist (Medical): No    Lack of Transportation (Non-Medical): No  Physical Activity: Sufficiently Active (11/23/2022)   Exercise Vital Sign    Days of Exercise per Week: 5 days    Minutes of Exercise per Session: 30 min  Stress: No Stress Concern Present (11/23/2022)   [place]    Feeling of Stress : Not at all  Social Connections: [place] (11/23/2022)   Social Connection and Isolation Panel [NHANES]    Frequency of Communication with Friends and Family: More than three times a week    Frequency of Social Gatherings with Friends and Family: More than three times a week    Attends Religious Services: More than 4 times per year    Active Member of [employer] or Organizations: Yes    Attends Music therapist: More than 4 times per year    Marital Status: Married  Human resources officer Violence: Not At Risk (11/23/2022)   Humiliation, Afraid, Rape, and Kick questionnaire    Fear of Current or Ex-Partner: No    Emotionally Abused: No    Physically Abused: No    Sexually Abused: No    Family History  Problem Relation Age of Onset   Cancer - Prostate Father      Review of Systems  Constitutional: Negative.  Negative for chills and fever.  HENT: Negative.  Negative for congestion and sore throat.   Respiratory: Negative.  Negative for cough and shortness of breath.   Cardiovascular: Negative.  Negative for chest pain and palpitations.  Gastrointestinal: Negative.  Negative for abdominal pain, diarrhea, nausea and vomiting.  Genitourinary: Negative.  Negative for hematuria.  Musculoskeletal:  Positive for joint pain  (Bilateral hip pain).  Skin: Negative.  Negative for rash.  Neurological: Negative.  Negative for dizziness and headaches.  All other systems reviewed and are negative.   Today's Vitals   01/25/23 1526  BP: 106/68  Pulse: 73  Temp: 98.4 F (36.9 C)  TempSrc: Oral  SpO2: 97%  Weight: 176 lb 2 oz (79.9 kg)  Height: 5' 7"$  (1.702 m)   Body mass index is 27.59 kg/m.  Physical Exam Vitals reviewed.  Constitutional:      Appearance: Normal appearance.  HENT:  Head: Normocephalic.  Eyes:     Extraocular Movements: Extraocular movements intact.     Pupils: Pupils are equal, round, and reactive to light.  Cardiovascular:     Rate and Rhythm: Normal rate and regular rhythm.     Pulses: Normal pulses.     Heart sounds: Normal heart sounds.  Pulmonary:     Effort: Pulmonary effort is normal.     Breath sounds: Normal breath sounds.  Abdominal:     Palpations: Abdomen is soft.     Tenderness: There is no abdominal tenderness.  Musculoskeletal:     Cervical back: No tenderness.     Right lower leg: No edema.     Left lower leg: No edema.  Lymphadenopathy:     Cervical: No cervical adenopathy.  Skin:    General: Skin is warm and dry.     Capillary Refill: Capillary refill takes less than 2 seconds.  Neurological:     General: No focal deficit present.     Mental Status: He is alert and oriented to person, place, and time.  Psychiatric:        Mood and Affect: Mood normal.        Behavior: Behavior normal.      ASSESSMENT & PLAN: A total of 48 minutes was spent with the patient and counseling/coordination of care regarding preparing for this visit, review of most recent office visit notes, review of most recent hospital discharge summary, review of multiple chronic medical conditions under management, review of all medications, review of most recent blood work results, review of most recent imaging reports, education on nutrition and hydration, pain management, prognosis,  documentation, need for follow-up.  Problem List Items Addressed This Visit       Cardiovascular and Mediastinum   Essential hypertension    Well-controlled hypertension. Continue amlodipine 5 mg and metoprolol succinate 50 mg daily. BP Readings from Last 3 Encounters:  01/25/23 106/68  01/24/23 124/70  01/15/23 112/74        Left ventricular thrombus    Stable.  Continues warfarin 6 mg daily as directed by coagulation clinic        Respiratory   Pneumonia due to COVID-19 virus - Primary    Much improved.  Recovering well. No need for oxygen at home. No longer having trouble breathing No more complications No concerns today.        Other   History of stroke    Well-controlled hypertension. Continues atorvastatin 80 mg daily No history of diabetes      Bilateral hip pain    Improved with physical activity.  Encouraged to walk around the house.  May take Tylenol for pain.  Must avoid NSAIDs.      Other Visit Diagnoses     Hospital discharge follow-up          Patient Instructions  Mantenimiento de la salud despus de los [place] Maintenance After Age 80 Despus de los 65 aos de edad, corre un riesgo mayor de Tourist information centre manager enfermedades e infecciones a Barrister's clerk, como tambin de sufrir lesiones por cadas. Las cadas son la causa principal de las fracturas de huesos y lesiones en la cabeza de personas mayores de 68 aos de edad. Recibir cuidados preventivos de forma regular puede ayudarlo a mantenerse saludable y en buen [place]. Los cuidados preventivos incluyen realizarse anlisis de forma regular y Actor en el estilo de vida segn las recomendaciones del mdico. [place] con el mdico sobre  lo siguiente: Las pruebas de deteccin y los anlisis que debe Dispensing optician. Una prueba de deteccin es un estudio que se para Hydrographic surveyor la presencia de una enfermedad cuando no tiene sntomas. Un plan de dieta y ejercicios adecuado para usted. Qu  debo saber sobre las pruebas de deteccin y los anlisis para prevenir cadas? Realizarse pruebas de deteccin y [employer] es la mejor manera de Hydrographic surveyor un problema de salud de forma temprana. El diagnstico y tratamiento tempranos le brindan la mejor oportunidad de Chief Technology Officer las afecciones mdicas que son comunes despus de los 27 aos de edad. Ciertas afecciones y elecciones de estilo de vida pueden hacer que sea ms propenso a sufrir Engineer, manufacturing. El mdico puede recomendarle lo siguiente: Controles regulares de la visin. Una visin deficiente y afecciones como las cataratas pueden hacer que sea ms propenso a sufrir Engineer, manufacturing. Si [place] lentes, asegrese de obtener una receta actualizada si su visin cambia. Revisin de medicamentos. Revise regularmente con el mdico todos los medicamentos que toma, incluidos los medicamentos de [place]. Consulte al [employer] efectos secundarios que pueden hacer que sea ms propenso a sufrir Engineer, manufacturing. Informe al mdico si alguno de los medicamentos que toma lo hace sentir mareado o somnoliento. Controles de fuerza y equilibrio. El mdico puede recomendar ciertos estudios para controlar su fuerza y equilibrio al estar de pie, al caminar o al cambiar de posicin. Examen de los pies. El dolor y Chiropractor en los pies, como tambin no utilizar el calzado [place], pueden hacer que sea ms propenso a sufrir Engineer, manufacturing. Pruebas de deteccin, que incluyen las siguientes: Pruebas de deteccin para la osteoporosis. La osteoporosis es una afeccin que hace que los huesos se tornen ms dbiles y se quiebren con ms facilidad. Pruebas de deteccin para la presin arterial. Los cambios en la presin arterial y los medicamentos para Chief Technology Officer la presin arterial pueden hacerlo sentir mareado. Prueba de deteccin de la depresin. Es ms probable que sufra una cada si tiene miedo a caerse, se siente deprimido o se siente incapaz de Stage manager. Prueba de deteccin de consumo de alcohol. Beber demasiado alcohol puede afectar su equilibrio y puede hacer que sea ms propenso a sufrir Engineer, manufacturing. Siga estas indicaciones en su casa: Estilo de vida No beba alcohol si: Su mdico le indica no hacerlo. Si bebe alcohol: Limite la cantidad que bebe a lo siguiente: De 0 a 1 medida por da para las mujeres. De 0 a 2 medidas por da para los hombres. Sepa cunta cantidad de alcohol hay en las bebidas que toma. En los [place], una medida equivale a una botella de cerveza de 12 oz (355 ml), un vaso de vino de 5 oz (148 ml) o un vaso de una bebida alcohlica de alta graduacin de 1 oz (44 ml). No consuma ningn producto que contenga nicotina o tabaco. Estos productos incluyen cigarrillos, tabaco para Higher education careers adviser y aparatos de vapeo, como los Psychologist, sport and exercise. Si necesita ayuda para dejar de consumir estos productos, consulte al [employer]. Actividad  Siga un programa de ejercicio regular para mantenerse en forma. Esto lo ayudar a Contractor equilibrio. Consulte al mdico qu tipos de ejercicios son adecuados para usted. Si necesita un bastn o un andador, selo segn las recomendaciones del mdico. Utilice calzado con buen apoyo y suela antideslizante. Seguridad  [place] los [place] puedan causar tropiezos tales como alfombras, cables u obstculos. Instale equipos de seguridad, como barras para [employer]  y barandas de seguridad en las escaleras. [place] habitaciones y los pasillos bien iluminados. Indicaciones generales Hable con el mdico sobre sus riesgos de sufrir una cada. Infrmele a su mdico si: Se cae. Asegrese de informarle a su mdico acerca de todas las cadas, incluso aquellas que parecen ser [employer]. Se siente mareado, cansado (tiene fatiga) o siente que pierde el equilibrio. Use los medicamentos de venta libre y los recetados solamente como se lo haya indicado el mdico. Estos incluyen  suplementos. Siga una dieta sana y [place] un peso saludable. Una dieta saludable incluye productos lcteos descremados, carnes bajas en contenido de grasa ([place]), fibra de granos enteros, frijoles y [place] frutas y verduras. [place] Realcese los estudios de rutina de la salud, dentales y de Public librarian. Resumen Tener un estilo de vida saludable y recibir cuidados preventivos pueden ayudar a Theatre stage manager salud y el bienestar despus de los 58 aos de [place]. Realizarse pruebas de deteccin y [employer] es la mejor manera de Hydrographic surveyor un problema de salud de forma temprana y Lourena Simmonds a Product/process development scientist una cada. El diagnstico y tratamiento tempranos le brindan la mejor oportunidad de Chief Technology Officer las afecciones mdicas ms comunes en las personas mayores de 67 aos de edad. Las cadas son la causa principal de las fracturas de huesos y lesiones en la cabeza de personas mayores de 24 aos de edad. Tome precauciones para evitar una cada en su casa. Trabaje con el mdico para saber qu cambios que puede hacer para mejorar su salud y [place], y [place]. Esta informacin no tiene Marine scientist el consejo del mdico. Asegrese de hacerle al mdico cualquier pregunta que tenga. Document Revised: 04/27/2021 Document Reviewed: 04/27/2021 Elsevier Patient Education  [place], MD [facility]

## 2023-01-25 NOTE — Assessment & Plan Note (Signed)
Much improved.  Recovering well. No need for oxygen at home. No longer having trouble breathing No more complications No concerns today.

## 2023-02-01 ENCOUNTER — Ambulatory Visit: Payer: Medicare Other | Attending: Family

## 2023-02-01 DIAGNOSIS — Z5181 Encounter for therapeutic drug level monitoring: Secondary | ICD-10-CM | POA: Insufficient documentation

## 2023-02-01 DIAGNOSIS — I63512 Cerebral infarction due to unspecified occlusion or stenosis of left middle cerebral artery: Secondary | ICD-10-CM | POA: Diagnosis not present

## 2023-02-01 LAB — POCT INR: INR: 2 (ref 2.0–3.0)

## 2023-02-01 NOTE — Patient Instructions (Signed)
Description   Take 1.5 tablets today and then continue taking Warfarin 1 tablet daily except 1/2 tablet on Tuesdays and Fridays.  Recheck INR in 2 week.  Coumadin Clinic [phone]

## 2023-02-15 ENCOUNTER — Ambulatory Visit: Payer: Medicare Other | Attending: Internal Medicine | Admitting: *Deleted

## 2023-02-15 DIAGNOSIS — Z5181 Encounter for therapeutic drug level monitoring: Secondary | ICD-10-CM | POA: Insufficient documentation

## 2023-02-15 DIAGNOSIS — I513 Intracardiac thrombosis, not elsewhere classified: Secondary | ICD-10-CM

## 2023-02-15 DIAGNOSIS — I63512 Cerebral infarction due to unspecified occlusion or stenosis of left middle cerebral artery: Secondary | ICD-10-CM | POA: Insufficient documentation

## 2023-02-15 LAB — POCT INR: INR: 2.8 (ref 2.0–3.0)

## 2023-02-15 NOTE — Patient Instructions (Signed)
Description   Continue taking Warfarin 1 tablet daily except 1/2 tablet on Tuesdays and Fridays.  Recheck INR in 3 weeks. Coumadin Clinic [phone] or [phone]

## 2023-02-16 ENCOUNTER — Other Ambulatory Visit: Payer: Self-pay | Admitting: Emergency Medicine

## 2023-02-20 ENCOUNTER — Ambulatory Visit (HOSPITAL_BASED_OUTPATIENT_CLINIC_OR_DEPARTMENT_OTHER): Payer: Medicare Other | Admitting: Family

## 2023-02-21 ENCOUNTER — Other Ambulatory Visit: Payer: Self-pay | Admitting: Cardiology

## 2023-02-22 ENCOUNTER — Ambulatory Visit: Payer: Medicare Other | Admitting: Emergency Medicine

## 2023-03-01 ENCOUNTER — Encounter: Payer: Self-pay | Admitting: Emergency Medicine

## 2023-03-01 DIAGNOSIS — E785 Hyperlipidemia, unspecified: Secondary | ICD-10-CM

## 2023-03-01 MED ORDER — ATORVASTATIN CALCIUM 80 MG PO TABS: 80.0000 mg | ORAL_TABLET | Freq: Every day | ORAL | 3 refills | Status: DC

## 2023-03-03 ENCOUNTER — Other Ambulatory Visit: Payer: Self-pay | Admitting: Emergency Medicine

## 2023-03-03 DIAGNOSIS — E785 Hyperlipidemia, unspecified: Secondary | ICD-10-CM

## 2023-03-08 ENCOUNTER — Ambulatory Visit: Payer: Medicare Other | Attending: Cardiology | Admitting: *Deleted

## 2023-03-08 DIAGNOSIS — I513 Intracardiac thrombosis, not elsewhere classified: Secondary | ICD-10-CM

## 2023-03-08 DIAGNOSIS — I63512 Cerebral infarction due to unspecified occlusion or stenosis of left middle cerebral artery: Secondary | ICD-10-CM | POA: Diagnosis present

## 2023-03-08 DIAGNOSIS — Z5181 Encounter for therapeutic drug level monitoring: Secondary | ICD-10-CM

## 2023-03-08 LAB — POCT INR: INR: 2 (ref 2.0–3.0)

## 2023-03-08 NOTE — Patient Instructions (Addendum)
Description   Tomorrow take 1 tablet of warfarin then continue taking Warfarin 1 tablet daily except 1/2 tablet on Tuesdays and Fridays. Recheck INR in 3 weeks. Coumadin Clinic [phone] or [phone]

## 2023-03-18 ENCOUNTER — Other Ambulatory Visit: Payer: Self-pay | Admitting: Emergency Medicine

## 2023-03-22 ENCOUNTER — Encounter (HOSPITAL_BASED_OUTPATIENT_CLINIC_OR_DEPARTMENT_OTHER): Payer: Self-pay | Admitting: Family

## 2023-03-22 ENCOUNTER — Ambulatory Visit (INDEPENDENT_AMBULATORY_CARE_PROVIDER_SITE_OTHER): Payer: Medicare Other | Admitting: Family

## 2023-03-22 VITALS — BP 126/72 | HR 64 | Ht 67.0 in | Wt 176.0 lb

## 2023-03-22 DIAGNOSIS — I513 Intracardiac thrombosis, not elsewhere classified: Secondary | ICD-10-CM

## 2023-03-22 DIAGNOSIS — D6859 Other primary thrombophilia: Secondary | ICD-10-CM

## 2023-03-22 DIAGNOSIS — E785 Hyperlipidemia, unspecified: Secondary | ICD-10-CM

## 2023-03-22 DIAGNOSIS — Z8673 Personal history of transient ischemic attack (TIA), and cerebral infarction without residual deficits: Secondary | ICD-10-CM

## 2023-03-22 DIAGNOSIS — I25118 Atherosclerotic heart disease of native coronary artery with other forms of angina pectoris: Secondary | ICD-10-CM

## 2023-03-22 DIAGNOSIS — I5022 Chronic systolic (congestive) heart failure: Secondary | ICD-10-CM

## 2023-03-22 MED ORDER — FUROSEMIDE 20 MG PO TABS: 20.0000 mg | ORAL_TABLET | ORAL | 1 refills | Status: DC

## 2023-03-22 NOTE — Patient Instructions (Signed)
Medication Instructions:  Your physician has recommended you make the following change in your medication:   CHANGE Furosemide to  once per week May take extra tablet as needed for swelling.   *If you need a refill on your cardiac medications before your next appointment, please call your pharmacy*  Follow-Up: At [facility], you and your health needs are our priority.  As part of our continuing mission to provide you with exceptional heart care, we have created designated Provider Care Teams.  These Care Teams include your primary Cardiologist (physician) and Advanced Practice Providers (APPs -  Physician Assistants and Nurse Practitioners) who all work together to provide you with the care you need, when you need it.  We recommend signing up for the patient portal called "MyChart".  Sign up information is provided on this After Visit Summary.  MyChart is used to connect with patients for Virtual Visits (Telemedicine).  Patients are able to view lab/test results, encounter notes, upcoming appointments, etc.  Non-urgent messages can be sent to your provider as well.   To learn more about what you can do with MyChart, go to [email or web address].    Your next appointment:   In August or September  Provider:   Gillian Shields, NP    Other Instructions  Call or send a MyChart message if you feel the swelling is not improving.   To prevent or reduce lower extremity swelling: Eat a low salt diet. Salt makes the body hold onto extra fluid which causes swelling. Sit with legs elevated. For example, in the recliner or on an ottoman.  Wear knee-high compression stockings during the daytime. Ones labeled 15-20 mmHg provide good compression.

## 2023-03-22 NOTE — Progress Notes (Signed)
Office Visit    Patient Name: Roberto Dickson Date of Encounter: 03/28/2023  PCP:  [name], MD   [facility]  Cardiologist:  Jodelle Red, MD  Advanced Practice Provider:  No care team member to display Electrophysiologist:  None     Chief Complaint    Roberto Dickson is a 82 y.o. male presents today for follow-up of heart failure.  Past Medical History    Past Medical History:  Diagnosis Date   Hypertension    Past Surgical History:  Procedure Laterality Date   APPENDECTOMY     in his 53's   CORONARY ANGIOGRAPHY N/A 10/20/2021   Procedure: CORONARY ANGIOGRAPHY;  Surgeon: Kathleene Hazel, MD;  Location: [facility] INVASIVE CV [facility];  Service: Cardiovascular;  Laterality: N/A;   IR CT HEAD LTD  07/19/2021   IR FLUORO GUIDED NEEDLE PLC ASPIRATION/INJECTION LOC  07/21/2021   IR PERCUTANEOUS ART THROMBECTOMY/INFUSION INTRACRANIAL INC DIAG ANGIO  07/19/2021   RADIOLOGY WITH ANESTHESIA N/A 07/19/2021   Procedure: IR WITH ANESTHESIA;  Surgeon: Julieanne Cotton, MD;  Location: [facility] OR;  Service: Radiology;  Laterality: N/A;    Allergies  No Known Allergies  History of Present Illness    Roberto Dickson is a 82 y.o. male with a hx of CVA, HTN, HLD, LV thrombus, HFrEF, CAD last seen 01/24/2023 by Wallis Bamberg, NP  He was admitted 07/26/2021 - 08/10/2021 with acute ischemic left middle cerebral artery (MCA) stroke.  He received tPA and additionally underwent cerebral angiography with revascularization of left ICA from proximal to distal terminus and origin of left MCA.  Echocardiogram with LVEF 50 to 55% with apical thrombus and Coumadin started.  There were wall motion abnormalities noted by echocardiogram but this was recommended for outpatient duration.[name]  He was recommended for CIR.  Seen in follow up 08/31/21, stress test was recommended due to wall motion abnormalities by echo. Stress test 09/06/21 with large size, severe  severity mid to apical inferior mostly fixed perfusion defect suggestive of large RCA territory scar with peri-infarct ischemia. LVEF read as 29% but suspected to be falsely low.  Cardiac CT 09/2021 calcium score 6053 and significant three vessel disease with likely CTO of LAD. Subsequent cardiac cath 10/20/21 with severe triple vessel CAD (CTO mid LAD, distal LAD diffusely diseased filing with L-L collaterals, moderate caliber diagonal branch severe disease, severe disease Cx and first obtuse marginal, CTO dominant RCA with L-R collaterals). PCI not an option. He met with Dr. Cliffton Asters of [facility] and decided to proceed with medical management. Repeat echocardiogram 01/13/22 with LVEF 45-50%, RWMA, mild LVH, gr1DD, dilation aortic root 41mm and ascending aorta 43mm. There was swirling artifact at apex suggesting slow flow but previously LV thrombus resolved. Given akinetic apex, recommended to continue anticoagulation.   At follow up 01/2022 and 04/2022 he was doing well from a cardiac perspective. Losartan was trialed for optimization of GDMT but was stopped due to rash which ended up being shingles. However, as BP well controlled on Amlodipine and asymptomatic in regards to his heart failure it was not resumed.   Seen 11/2021 doing well from a cardiac perspective and no changes made at that time.  Subsequently admitted 01/2023 for respiratory failure in the setting of COVID-19 infection.  Updated echocardiogram 01/10/23 LVEF 45-50%, WMA, moderate LVH. He was volume overloaded at visit 01/24/23 and started on lasix x 2 days and then PRN.   Presents today for follow-up with his daughter.  Visit  assisted by interpreter.  Feeling overall well since his last visit with Victorino Dike.  He has not taken an additional dose of Lasix though does note over the past few days his ankles have become more swollen.  Approximate driving 161 pounds with weight today 176 pounds.  Stable exertional dyspnea with more than usual activity.  No  shortness of breath at rest, orthopnea, PND, chest pain.  EKGs/Labs/Other Studies Reviewed:   The following studies were reviewed today: Cardiac Studies & Procedures   CARDIAC CATHETERIZATION  CARDIAC CATHETERIZATION 10/20/2021  Narrative   Mid RCA lesion is 100% stenosed.   Mid Cx to Dist Cx lesion is 90% stenosed.   1st Mrg lesion is 90% stenosed.   Prox Cx to Mid Cx lesion is 80% stenosed.   Mid LAD to Dist LAD lesion is 100% stenosed.   2nd Diag lesion is 90% stenosed.   Ost LAD to Mid LAD lesion is 30% stenosed.  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 The distal LAD is diffusely diseased and fills from left to left collaterals. The moderate caliber diagonal branch has severe disease. Severe disease in the mid and distal AV groove Circumflex. Severe disease in the first obtuse marginal branch Chronic total occlusion of the dominant RCA in the mid segment. The distal vessel fills from left to right collaterals.  Recommendations: He has an LV thrombus by recent echo and has been on coumadin. He has been bridged with Lovenox for this procedure today. I did not cross into the LV today due to the LV thrombus. He will be admitted post cath and we will start IV heparin 8 hours post sheath pull. I would anticipate that he will be watched overnight and then Lovenox bridging resumed tomorrow if no access site bleeding. Resume coumadin tomorrow. In regards to his CAD, he has severe three vessel CAD with CTO of the RCA and LAD. PCI is not an option. His options include medical management vs CABG. Given advanced age, recent stroke and LV thrombus, he may not be a good candidate for CABG. This would be delayed for several months anyway given LV thrombus and recent stroke. We can plan an outpatient CT surgery consult in several weeks post discharge. Fortunately he has overall preserved LV systolic function and is asymptomatic.  Findings Coronary Findings Diagnostic   Dominance: Right  Left Anterior Descending Vessel is large. Collaterals Dist LAD filled by collaterals from 1st Sept.  Ost LAD to Mid LAD lesion is 30% stenosed. The lesion is calcified. Mid LAD to Dist LAD lesion is 100% stenosed. The lesion is chronically occluded.  Second Diagonal Branch 2nd Diag lesion is 90% stenosed.  Left Circumflex Vessel is large. Prox Cx to Mid Cx lesion is 80% stenosed. Mid Cx to Dist Cx lesion is 90% stenosed.  First Obtuse Marginal Branch 1st Mrg lesion is 90% stenosed.  Right Coronary Artery Vessel is large. Mid RCA lesion is 100% stenosed. The lesion is chronically occluded.  Third Right Posterolateral Branch Collaterals 3rd RPL filled by collaterals from 2nd Sept.  Intervention  No interventions have been documented.   STRESS TESTS  MYOCARDIAL PERFUSION IMAGING 09/06/2021  Narrative   Findings are consistent with prior myocardial infarction with peri-infarct ischemia. The study is high risk.   No ST deviation was noted.   LV perfusion is abnormal. There is no evidence of ischemia. There is evidence of infarction. Defect 1: There is a large defect with severe reduction in uptake present in  the apical to mid inferior location(s) that is partially reversible. Viability is present. There is abnormal wall motion in the defect area. Consistent with infarction and peri-infarct ischemia.   Left ventricular function is abnormal. Global function is severely reduced. There was a single regional abnormality. Nuclear stress EF: 29 %. The left ventricular ejection fraction is severely decreased (<30%). End diastolic cavity size is mildly enlarged. End systolic cavity size is mildly enlarged.   Prior study not available for comparison.  Large size, severe severity mid to apical inferior mostly fixed perfusion defect, suggestive of large RCA territory scar with peri-infarct ischemia (SDS 5). LVEF 29% with inferior and apical akinesis (suspect LVEF is  falsely low, recommend echo correlation). This is a high risk study. No prior for comparison.   ECHOCARDIOGRAM  ECHOCARDIOGRAM COMPLETE 01/10/2023  Narrative ECHOCARDIOGRAM REPORT    Patient Name:   Roberto Dickson Date of Exam: 01/10/2023 Medical Rec #:  [id number]         Height:       67.0 in Accession #:    [id number]        Weight:       162.0 lb Date of Birth:  January 03, 1941         BSA:          1.849 m Patient Age:    82 years          BP:           102/69 mmHg Patient Gender: M                 HR:           69 bpm. Exam Location:  Inpatient  Procedure: 2D Echo, Cardiac Doppler, Color Doppler and Intracardiac Opacification Agent  Indications:     Elevated Troponin  History:         Patient has prior history of Echocardiogram examinations, most recent 01/13/2022. CAD, Stroke; Risk Factors:Hypertension and Dyslipidemia. LV Thrombus, COVID +.  Sonographer:     Mikki Harbor Referring Phys:  [id number] DAWOOD Teena Irani Diagnosing Phys: Epifanio Lesches MD  IMPRESSIONS   1. Left ventricular ejection fraction, by estimation, is 45 to 50%. The left ventricle has mildly decreased function. The left ventricle demonstrates regional wall motion abnormalities (see scoring diagram/findings for description). There is moderate asymmetric left ventricular hypertrophy of the basal-septal segment. Left ventricular diastolic parameters are consistent with Grade I diastolic dysfunction (impaired relaxation). 2. Apical akinesis with contrast images showing swirling artifact at apex consistent with slow flow but no clear thrombus seen 3. Right ventricular systolic function is normal. The right ventricular size is normal. 4. Left atrial size was mildly dilated. 5. The mitral valve is normal in structure. No evidence of mitral valve regurgitation. No evidence of mitral stenosis. 6. The aortic valve was not well visualized. Aortic valve regurgitation is not visualized. No aortic stenosis is  present. 7. Aortic dilatation noted. There is dilatation of the aortic root, measuring 42 mm. There is dilatation of the ascending aorta, measuring 39 mm.  FINDINGS Left Ventricle: Left ventricular ejection fraction, by estimation, is 45 to 50%. The left ventricle has mildly decreased function. The left ventricle demonstrates regional wall motion abnormalities. Definity contrast agent was given IV to delineate the left ventricular endocardial borders. The left ventricular internal cavity size was small. There is moderate asymmetric left ventricular hypertrophy of the basal-septal segment. Left ventricular diastolic parameters are consistent with Grade I diastolic dysfunction (impaired relaxation).   LV  Wall Scoring: The apical septal segment, apical inferior segment, and apex are akinetic. The entire anterior wall, entire lateral wall, anterior septum, inferior wall, mid inferoseptal segment, and basal inferoseptal segment are normal.  Right Ventricle: The right ventricular size is normal. No increase in right ventricular wall thickness. Right ventricular systolic function is normal. The tricuspid regurgitant velocity is 2.62 m/s, and with an assumed right atrial pressure of 8 mmHg, the estimated right ventricular systolic pressure is 35.5 mmHg.  Left Atrium: Left atrial size was mildly dilated.  Right Atrium: Right atrial size was normal in size.  Pericardium: There is no evidence of pericardial effusion.  Mitral Valve: The mitral valve is normal in structure. No evidence of mitral valve regurgitation. No evidence of mitral valve stenosis. MV peak gradient, 3.1 mmHg. The mean mitral valve gradient is 1.0 mmHg.  Tricuspid Valve: The tricuspid valve is normal in structure. Tricuspid valve regurgitation is trivial.  Aortic Valve: The aortic valve was not well visualized. Aortic valve regurgitation is not visualized. No aortic stenosis is present. Aortic valve mean gradient measures 4.0 mmHg.  Aortic valve peak gradient measures 7.4 mmHg. Aortic valve area, by VTI measures 2.68 cm.  Pulmonic Valve: The pulmonic valve was not well visualized. Pulmonic valve regurgitation is not visualized.  Aorta: Aortic dilatation noted. There is dilatation of the aortic root, measuring 42 mm. There is dilatation of the ascending aorta, measuring 39 mm.  IAS/Shunts: The interatrial septum was not well visualized.   LEFT VENTRICLE PLAX 2D LVIDd:         5.00 cm   Diastology LVIDs:         2.80 cm   LV e' medial:    6.06 cm/s LV PW:         1.20 cm   LV E/e' medial:  9.4 LV IVS:        1.30 cm   LV e' lateral:   12.40 cm/s LVOT diam:     2.20 cm   LV E/e' lateral: 4.6 LV SV:         77 LV SV Index:   42 LVOT Area:     3.80 cm   RIGHT VENTRICLE RV Basal diam:  2.90 cm RV Mid diam:    3.30 cm  LEFT ATRIUM             Index        RIGHT ATRIUM           Index LA diam:        4.30 cm 2.33 cm/m   RA Area:     15.90 cm LA Vol (A2C):   71.6 ml 38.72 ml/m  RA Volume:   38.70 ml  20.93 ml/m LA Vol (A4C):   61.6 ml 33.31 ml/m LA Biplane Vol: 69.2 ml 37.43 ml/m AORTIC VALVE                    PULMONIC VALVE AV Area (Vmax):    2.85 cm     PV Vmax:       0.88 m/s AV Area (Vmean):   2.67 cm     PV Peak grad:  3.1 mmHg AV Area (VTI):     2.68 cm AV Vmax:           136.00 cm/s AV Vmean:          90.400 cm/s AV VTI:            0.288 m AV Peak Grad:  7.4 mmHg AV Mean Grad:      4.0 mmHg LVOT Vmax:         102.00 cm/s LVOT Vmean:        63.500 cm/s LVOT VTI:          0.203 m LVOT/AV VTI ratio: 0.70  AORTA Ao Root diam: 4.20 cm Ao Asc diam:  3.90 cm  MITRAL VALVE               TRICUSPID VALVE MV Area (PHT): 2.63 cm    TR Peak grad:   27.5 mmHg MV Area VTI:   3.30 cm    TR Vmax:        262.00 cm/s MV Peak grad:  3.1 mmHg MV Mean grad:  1.0 mmHg    SHUNTS MV Vmax:       0.88 m/s    Systemic VTI:  0.20 m MV Vmean:      48.0 cm/s   Systemic Diam: 2.20 cm MV Decel Time: 288  msec MV E velocity: 56.80 cm/s MV A velocity: 87.80 cm/s MV E/A ratio:  0.65  Epifanio Lesches MD Electronically signed by Epifanio Lesches MD Signature Date/Time: 01/10/2023/7:03:28 PM    Final (Updated)     CT SCANS  CT CORONARY MORPH W/CTA COR W/SCORE 09/29/2021  Addendum 09/29/2021  3:31 PM ADDENDUM REPORT: 09/29/2021 15:28  EXAM: OVER-READ INTERPRETATION  CT CHEST  The following report is an over-read performed by radiologist Dr. Marinda Elk [facility] on 09/29/2021. This over-read does not include interpretation of cardiac or coronary anatomy or pathology. The coronary CTA interpretation by the cardiologist is attached.  COMPARISON:  None.  FINDINGS: No significant noncardiac vascular findings. Visualized mediastinum and hilar regions demonstrate no lymphadenopathy or masses. There is a small hiatal hernia. Lungs demonstrate scarring and atelectasis at both lung bases. There may be a component bronchial thickening with some mild mucous plugging in posterior lower lobe airways bilaterally. Visualized bony structures and upper abdomen are unremarkable.  IMPRESSION: 1. Small hiatal hernia. 2. Suggestion mucous plugging and bronchial thickening in the posterior lower lobes bilaterally. Bibasilar pulmonary scarring and atelectasis.   Electronically Signed By: Irish Lack M.D. On: 09/29/2021 15:28  Narrative CLINICAL DATA:  Cardiomyopathy  EXAM: Cardiac/Coronary CTA  TECHNIQUE: A non-contrast, gated CT scan was obtained with axial slices of 3 mm through the heart for calcium scoring. Calcium scoring was performed using the Agatston method. A 100 kV prospective, gated, contrast cardiac scan was obtained. Gantry rotation speed was 250 msecs and collimation was 0.6 mm. Two sublingual nitroglycerin tablets (0.8 mg) were given. The 3D data set was reconstructed in 5% intervals of the 35-75% of the R-R cycle. Diastolic phases were  analyzed on a dedicated workstation using MPR, MIP, and VRT modes. The patient received 95 cc of contrast.  FINDINGS: Image quality: Excellent.  Noise artifact is: Limited.  Coronary Arteries:  Normal coronary origin.  Right dominance.  Left main: The left main is a large caliber vessel with a normal take off from the left coronary cusp that bifurcates to form a left anterior descending artery and a left circumflex artery. There is minimal mixed density plaque (<25%).  Left anterior descending artery: The proximal LAD contains mild calcified plaque (25-49%). The mid LAD is 100% occluded and there is filling of the distal vessel which is likely from collateral flow. The first diagonal appears occluded (100%). The second diagonal contains moderate mixed density plaque (50-69%).  Left circumflex artery: The LCX  is non-dominant. The proximal LCX contains minimal mixed density plaque (<25%). The mid LCX contains moderate mixed density plaque (50-60%). OM1 is diffusely diseased with moderate non-calcified plaque (50-69%). OM2 and OM3 are patent.  Right coronary artery: The RCA is dominant with normal take off from the right coronary cusp. There is severe mixed density plaque (70-99%) in the proximal segment. The mid RCA is 100% occluded versus subtotal occlusion.  Right Atrium: Right atrial size is within normal limits.  Right Ventricle: The right ventricular cavity is within normal limits.  Left Atrium: Left atrial size is normal in size with no left atrial appendage filling defect.  Left Ventricle: The ventricular cavity size is within normal limits. The apical myocardium is thinned and aneurysmal consistent with prior LAD infarction. There is a filling defect in the apex (measures 26 mm x 26 mm) consistent with LV thrombus.  Pulmonary arteries: Normal in size without proximal filling defect.  Pulmonary veins: Normal pulmonary venous drainage.  Pericardium: Normal  thickness with no significant effusion or calcium present.  Cardiac valves: The aortic valve is trileaflet without significant calcification. The mitral valve is normal structure without significant calcification.  Aorta: Normal caliber with no significant disease.  Extra-cardiac findings: See attached radiology report for non-cardiac structures.  IMPRESSION: 1. Coronary calcium score of 6053. This was 99th percentile for age-, sex, and race-matched controls.  2. Normal coronary origin with right dominance.  3. 100% occlusion of the mid LAD that likely a chronic total occlusion with collateral flow filling the vessel distally.  4. Moderate (50-69%) mixed density plaque in the mid LCX.  5. Severe stenosis in the proximal RCA (70-99%) with 100% occlusion versus subtotal occlusion of the mid RCA.  6. The apical myocardium is thinned and aneurysmal consistent with prior LAD infarction.  7. There is a filling defect in the apex (measures 26 mm x 26 mm) consistent with LV thrombus.  RECOMMENDATIONS: 1. Cardiac catheterization is recommended due to concerns for 3-vessel CAD.  2. LV thrombus is known from prior imaging.  Lennie Odor, MD  Electronically Signed: By: Lennie Odor M.D. On: 09/29/2021 14:07          EKG:No EKG today.   Recent Labs: 01/09/2023: B Natriuretic Peptide 195.7 01/14/2023: ALT 39; Magnesium 2.0 01/24/2023: BUN 38; Creatinine, Ser 1.56; Hemoglobin 12.2; Platelets 167; Potassium 4.7; Sodium 140  Recent Lipid Panel    Component Value Date/Time   CHOL 128 10/13/2021 0000   TRIG 134 10/13/2021 0000   HDL 30 (L) 10/13/2021 0000   CHOLHDL 4.3 10/13/2021 0000   CHOLHDL 6.0 07/20/2021 0536   VLDL 17 07/20/2021 0536   LDLCALC 74 10/13/2021 0000   Home Medications   Current Meds  Medication Sig   acetaminophen (TYLENOL) 325 MG tablet Take 1-2 tablets (325-650 mg total) by mouth every 4 (four) hours as needed for mild pain. (Patient taking  differently: Take 325 mg by mouth daily as needed for mild pain.)   amLODipine (NORVASC) 5 MG tablet TAKE 1 TABLET(5 MG) BY MOUTH DAILY   aspirin EC (ASPIRIN LOW DOSE) 81 MG tablet Take 1 tablet (81 mg total) by mouth daily. Swallow whole.   atorvastatin (LIPITOR) 80 MG tablet TAKE 1 TABLET(80 MG) BY MOUTH DAILY   docusate sodium (COLACE) 100 MG capsule Take 1 capsule (100 mg total) by mouth 2 (two) times daily. (Patient taking differently: Take 200 mg by mouth every other day.)   melatonin 3 MG TABS tablet Take 0.5 tablets (1.5 mg  total) by mouth at bedtime. OVER THE COUNTER (Patient taking differently: Take 1.5 mg by mouth at bedtime.)   metoprolol succinate (TOPROL-XL) 50 MG 24 hr tablet TAKE 1 TABLET(50 MG) BY MOUTH AT BEDTIME (Patient taking differently: Take 50 mg by mouth at bedtime.)   pantoprazole (PROTONIX) 40 MG tablet TAKE 1 TABLET(40 MG) BY MOUTH AT BEDTIME   tamsulosin (FLOMAX) 0.4 MG CAPS capsule TAKE 1 CAPSULE BY MOUTH DAILY   tiZANidine (ZANAFLEX) 2 MG tablet TAKE 1 TABLET BY MOUTH AT BEDTIME AS NEEDED FOR MUSCLE SPASMS   warfarin (COUMADIN) 6 MG tablet TAKE 1 TABLET BY MOUTH DAILY AS DIRECTED BY COAGULATION CLINIC - Hold warfarin dose 01/15/23. Take warfarin 3 mg on 01/16/23 then resume home dosing   [DISCONTINUED] furosemide (LASIX) 20 MG tablet TAKE 1 TABLET BY MOUTH DAILY FOR 2 DAYS, THEN AS NEEDED FOR EDEMA(PEDAL EDEMA)     Review of Systems    All other systems reviewed and are otherwise negative except as noted above.  Physical Exam    VS:  BP 126/72   Pulse 64   Ht 5\' 7"  (1.702 m)   Wt 176 lb (79.8 kg)   SpO2 96%   BMI 27.57 kg/m  , BMI Body mass index is 27.57 kg/m.  Wt Readings from Last 3 Encounters:  03/22/23 176 lb (79.8 kg)  01/25/23 176 lb 2 oz (79.9 kg)  01/24/23 177 lb 6.4 oz (80.5 kg)    GEN: Well nourished, well developed, in no acute distress. HEENT: normal. Neck: Supple, no JVD, carotid bruits, or masses. Cardiac: RRR, no murmurs, rubs, or  gallops. No clubbing, cyanosis, edema.  Radials/PT 2+ and equal bilaterally.  Respiratory:  Respirations regular and unlabored, clear to auscultation bilaterally. GI: Soft, nontender, nondistended. MS: No deformity or atrophy. Skin: Warm and dry, no rash. R radial catheterization site with no ecchymosis nor signs of infection.  Neuro:  Strength and sensation are intact. Psych: Normal affect.  Assessment & Plan    CAD - Echo 10/2021 with significant 3 vessel CAD not PCI candidate. LVEF preserved. Seen by [facility] and prefers medial management.He is asymptomatic with no chest pain nor dyspnea. His GDMT includes aspirin, metoprolol, atorvastatin. If anginal symptoms develop, could consider Imdur or alternative antianginal agent. Heart healthy diet and regular cardiovascular exercise encouraged.    HFrEF - Echo 01/2022 mildly reduced LVEF 45-50%.  Repeat echo 01/10/2023 LVEF 45-50%, WMA, apical akinesis showing slow flow but no clear thrombus, dilation aortic root 42 mm and ascending aorta 39 mm.  Recommend fluid restriction <2L, low salt diet. Continue metoprolol. BP previously not felt to tolerate ARB/ARNI.  NYHA I.  Given mild lower extremity edema we will transition his Lasix from as needed to once per week with additional dose as needed for edema.  History of CVA-Continue to follow with neurology.  Continue atorvastatin, aspirin. Mild R hand and RLE defecits. Has been painting to work on deficits.   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 Repeat echo 01/2022 with resolution of thrombus but still akinetic apex with slow flow recommended for continuation of Warfarin.  Repeat echo 01/2023 with stable slow flow but no thrombus.  Denies hematuria, melena. Follows routinely with coumadin clinic.   HTN- Continue Amlodipine.   HLD, LDL goal less than 70-Continue Atorvastatin 80mg  daily.   Disposition: Follow up in 4-5 months with Dr. Cristal Deer or  APP.  Signed, Alver Sorrow, NP 03/28/2023, 9:34 AM [facility]

## 2023-03-28 ENCOUNTER — Encounter (HOSPITAL_BASED_OUTPATIENT_CLINIC_OR_DEPARTMENT_OTHER): Payer: Self-pay | Admitting: Family

## 2023-03-29 ENCOUNTER — Ambulatory Visit: Payer: Medicare Other | Attending: Family | Admitting: *Deleted

## 2023-03-29 DIAGNOSIS — I513 Intracardiac thrombosis, not elsewhere classified: Secondary | ICD-10-CM

## 2023-03-29 DIAGNOSIS — Z5181 Encounter for therapeutic drug level monitoring: Secondary | ICD-10-CM | POA: Diagnosis present

## 2023-03-29 DIAGNOSIS — I63512 Cerebral infarction due to unspecified occlusion or stenosis of left middle cerebral artery: Secondary | ICD-10-CM | POA: Diagnosis present

## 2023-03-29 LAB — POCT INR: INR: 2.4 (ref 2.0–3.0)

## 2023-03-29 NOTE — Patient Instructions (Signed)
Description   Continue taking Warfarin 1 tablet daily except 1/2 tablet on Tuesdays and Fridays. Recheck INR in 4 weeks. Coumadin Clinic [phone] or [phone]

## 2023-04-17 ENCOUNTER — Other Ambulatory Visit: Payer: Self-pay | Admitting: Emergency Medicine

## 2023-04-26 ENCOUNTER — Ambulatory Visit: Payer: Medicare Other | Attending: Family | Admitting: *Deleted

## 2023-04-26 DIAGNOSIS — I513 Intracardiac thrombosis, not elsewhere classified: Secondary | ICD-10-CM | POA: Diagnosis present

## 2023-04-26 DIAGNOSIS — I63512 Cerebral infarction due to unspecified occlusion or stenosis of left middle cerebral artery: Secondary | ICD-10-CM | POA: Diagnosis present

## 2023-04-26 LAB — POCT INR: INR: 2.3 (ref 2.0–3.0)

## 2023-04-26 NOTE — Patient Instructions (Signed)
Description   Continue taking Warfarin 1 tablet daily except 1/2 tablet on Tuesdays and Fridays. Recheck INR in 5 weeks. Coumadin Clinic [phone] or [phone]

## 2023-05-03 ENCOUNTER — Ambulatory Visit (INDEPENDENT_AMBULATORY_CARE_PROVIDER_SITE_OTHER): Payer: Medicare Other | Admitting: Emergency Medicine

## 2023-05-03 ENCOUNTER — Encounter: Payer: Self-pay | Admitting: Emergency Medicine

## 2023-05-03 VITALS — BP 120/62 | HR 62 | Temp 98.6°F | Ht 67.0 in | Wt 174.5 lb

## 2023-05-03 DIAGNOSIS — E785 Hyperlipidemia, unspecified: Secondary | ICD-10-CM | POA: Diagnosis not present

## 2023-05-03 DIAGNOSIS — I5020 Unspecified systolic (congestive) heart failure: Secondary | ICD-10-CM

## 2023-05-03 DIAGNOSIS — I25118 Atherosclerotic heart disease of native coronary artery with other forms of angina pectoris: Secondary | ICD-10-CM

## 2023-05-03 DIAGNOSIS — I513 Intracardiac thrombosis, not elsewhere classified: Secondary | ICD-10-CM | POA: Diagnosis not present

## 2023-05-03 DIAGNOSIS — I10 Essential (primary) hypertension: Secondary | ICD-10-CM

## 2023-05-03 HISTORY — DX: Unspecified systolic (congestive) heart failure: I50.20

## 2023-05-03 NOTE — Assessment & Plan Note (Signed)
Well-controlled hypertension Continue amlodipine 5 mg, metoprolol succinate 50 mg

## 2023-05-03 NOTE — Assessment & Plan Note (Signed)
Stable.  No recent anginal episodes. Continue daily baby aspirin 

## 2023-05-03 NOTE — Progress Notes (Signed)
Roberto Dickson 82 y.o.   Chief Complaint  Patient presents with   Medical Management of Chronic Issues    f/u appt, right ankle swelling, taking lasix     HISTORY OF PRESENT ILLNESS: This is a 82 y.o. male here for 21-month follow-up of chronic medical problems.  Accompanied by daughter. Doing well.  Started taking Lasix once a week for ankle swelling Otherwise doing well.  No other complaints or medical concerns today.  HPI   Prior to Admission medications   Medication Sig Start Date End Date Taking? Authorizing Provider  amLODipine (NORVASC) 5 MG tablet TAKE 1 TABLET(5 MG) BY MOUTH DAILY 02/16/23  Yes Sarra Rachels, Eilleen Kempf, MD  aspirin EC (ASPIRIN LOW DOSE) 81 MG tablet Take 1 tablet (81 mg total) by mouth daily. Swallow whole. 10/19/22  Yes Jodelle Red, MD  atorvastatin (LIPITOR) 80 MG tablet TAKE 1 TABLET(80 MG) BY MOUTH DAILY 03/03/23  Yes Blonnie Maske, Eilleen Kempf, MD  docusate sodium (COLACE) 100 MG capsule Take 1 capsule (100 mg total) by mouth 2 (two) times daily. Patient taking differently: Take 200 mg by mouth every other day. 08/10/21  Yes Love, Evlyn Kanner, PA-C  furosemide (LASIX) 20 MG tablet Take 1 tablet (20 mg total) by mouth once a week. Ok to take an extra as needed 03/22/23  Yes Alver Sorrow, NP  melatonin 3 MG TABS tablet Take 0.5 tablets (1.5 mg total) by mouth at bedtime. OVER THE COUNTER Patient taking differently: Take 1.5 mg by mouth at bedtime. 08/10/21  Yes Love, Evlyn Kanner, PA-C  metoprolol succinate (TOPROL-XL) 50 MG 24 hr tablet TAKE 1 TABLET(50 MG) BY MOUTH AT BEDTIME Patient taking differently: Take 50 mg by mouth at bedtime. 11/20/22  Yes Jodelle Red, MD  pantoprazole (PROTONIX) 40 MG tablet TAKE 1 TABLET(40 MG) BY MOUTH AT BEDTIME 02/16/23  Yes Dreana Britz, Eilleen Kempf, MD  tamsulosin (FLOMAX) 0.4 MG CAPS capsule TAKE 1 CAPSULE BY MOUTH DAILY 01/18/23  Yes Jabri Blancett, Eilleen Kempf, MD  tiZANidine (ZANAFLEX) 2 MG tablet TAKE 1 TABLET BY  MOUTH AT BEDTIME AS NEEDED FOR MUSCLE SPASMS 03/18/23  Yes Analeise Mccleery, Eilleen Kempf, MD  warfarin (COUMADIN) 6 MG tablet TAKE 1 TABLET BY MOUTH DAILY AS DIRECTED BY COAGULATION CLINIC - Hold warfarin dose 01/15/23. Take warfarin 3 mg on 01/16/23 then resume home dosing 01/16/23  Yes Elgergawy, Leana Roe, MD  acetaminophen (TYLENOL) 325 MG tablet Take 1-2 tablets (325-650 mg total) by mouth every 4 (four) hours as needed for mild pain. Patient taking differently: Take 325 mg by mouth daily as needed for mild pain. 08/10/21   Jacquelynn Cree, PA-C    No Known Allergies  Patient Active Problem List   Diagnosis Date Noted   Bilateral hip pain 01/25/2023   History of seizure 08/24/2022   History of stroke 11/09/2021   Hyperlipidemia 10/21/2021   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   Coronary artery disease of native artery of native heart with stable angina pectoris (HCC)    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/12/2021   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   Sleep disturbance    Essential hypertension     Past Medical History:  Diagnosis Date   Hypertension     Past Surgical History:  Procedure Laterality Date   APPENDECTOMY     in his 40's   CORONARY ANGIOGRAPHY N/A 10/20/2021   Procedure: CORONARY ANGIOGRAPHY;  Surgeon: Kathleene Hazel, MD;  Location: [facility];  Service: Cardiovascular;  Laterality: N/A;  IR CT HEAD LTD  07/19/2021   IR FLUORO GUIDED NEEDLE PLC ASPIRATION/INJECTION LOC  07/21/2021   IR PERCUTANEOUS ART THROMBECTOMY/INFUSION INTRACRANIAL INC DIAG ANGIO  07/19/2021   RADIOLOGY WITH ANESTHESIA N/A 07/19/2021   Procedure: IR WITH ANESTHESIA;  Surgeon: Julieanne Cotton, MD;  Location: [facility] OR;  Service: Radiology;  Laterality: N/A;    Social History   Socioeconomic History   Marital status: Married    Spouse name: Not on file   Number of children: Not on file   Years of education: Not on file   Highest education level: Not on file  Occupational History    Not on file  Tobacco Use   Smoking status: Never   Smokeless tobacco: Never  Vaping Use   Vaping Use: Never used  Substance and Sexual Activity   Alcohol use: Not Currently    Alcohol/week: 1.0 standard drink of alcohol    Types: 1 Cans of beer per week   Drug use: Never   Sexual activity: Not on file  Other Topics Concern   Not on file  Social History Narrative   Not on file   Social Determinants of Health   Financial Resource Strain: Low Risk  (11/23/2022)   Overall Financial Resource Strain (CARDIA)    Difficulty of Paying Living Expenses: Not hard at all  Food Insecurity: No Food Insecurity (11/23/2022)   Hunger Vital Sign    Worried About Running Out of Food in the Last Year: Never true    Ran Out of Food in the Last Year: Never true  Transportation Needs: No Transportation Needs (11/23/2022)   PRAPARE - Administrator, Civil Service (Medical): No    Lack of Transportation (Non-Medical): No  Physical Activity: Sufficiently Active (11/23/2022)   Exercise Vital Sign    Days of Exercise per Week: 5 days    Minutes of Exercise per Session: 30 min  Stress: No Stress Concern Present (11/23/2022)   [employer] of Occupational Health - Occupational Stress Questionnaire    Feeling of Stress : Not at all  Social Connections: Socially Integrated (11/23/2022)   Social Connection and Isolation Panel [NHANES]    Frequency of Communication with Friends and Family: More than three times a week    Frequency of Social Gatherings with Friends and Family: More than three times a week    Attends Religious Services: More than 4 times per year    Active Member of [employer] or Organizations: Yes    Attends Engineer, structural: More than 4 times per year    Marital Status: Married  Catering manager Violence: Not At Risk (11/23/2022)   Humiliation, Afraid, Rape, and Kick questionnaire    Fear of Current or Ex-Partner: No    Emotionally Abused: No    Physically  Abused: No    Sexually Abused: No    Family History  Problem Relation Age of Onset   Cancer - Prostate Father      Review of Systems  Constitutional: Negative.  Negative for chills and fever.  HENT: Negative.  Negative for congestion and sore throat.   Respiratory: Negative.  Negative for cough and shortness of breath.   Cardiovascular:  Positive for leg swelling (Bilateral ankle swelling). Negative for chest pain and palpitations.  Gastrointestinal:  Negative for abdominal pain, nausea and vomiting.  Genitourinary: Negative.  Negative for dysuria.  Skin: Negative.  Negative for rash.  Neurological: Negative.  Negative for dizziness and headaches.  All other systems reviewed and  are negative.   Vitals:   05/03/23 1531  BP: 120/62  Pulse: 62  Temp: 98.6 F (37 C)  SpO2: 96%    Physical Exam Vitals reviewed.  Constitutional:      Appearance: Normal appearance.  HENT:     Head: Normocephalic.     Mouth/Throat:     Mouth: Mucous membranes are moist.     Pharynx: Oropharynx is clear.  Eyes:     Extraocular Movements: Extraocular movements intact.     Conjunctiva/sclera: Conjunctivae normal.     Pupils: Pupils are equal, round, and reactive to light.  Cardiovascular:     Rate and Rhythm: Normal rate and regular rhythm.     Pulses: Normal pulses.     Heart sounds: Normal heart sounds.  Pulmonary:     Effort: Pulmonary effort is normal.     Breath sounds: Normal breath sounds.  Abdominal:     Palpations: Abdomen is soft.     Tenderness: There is no abdominal tenderness.  Musculoskeletal:     Cervical back: No tenderness.     Comments: Bilateral ankle swelling  Lymphadenopathy:     Cervical: No cervical adenopathy.  Skin:    General: Skin is warm and dry.     Capillary Refill: Capillary refill takes less than 2 seconds.  Neurological:     Mental Status: He is alert and oriented to person, place, and time.  Psychiatric:        Mood and Affect: Mood normal.         Behavior: Behavior normal.      ASSESSMENT & PLAN: A total of 43 minutes was spent with the patient and counseling/coordination of care regarding preparing for this visit, review of most recent office visit notes, review of most recent cardiologist office visit notes, review of multiple chronic medical conditions under management, review of all medications, review of most recent blood work results, education on nutrition, prognosis, documentation, and need for follow-up.  Problem List Items Addressed This Visit       Cardiovascular and Mediastinum   Essential hypertension    Well-controlled hypertension Continue amlodipine 5 mg, metoprolol succinate 50 mg      Left ventricular thrombus    No complications. Continues warfarin with follow-up at the coagulation clinic      Coronary artery disease of native artery of native heart with stable angina pectoris (HCC) - Primary    Stable.  No recent anginal episodes.  Continue daily baby aspirin      HFrEF (heart failure with reduced ejection fraction) (HCC)    Echo 01/2022 mildly reduced LVEF 45-50%.  Repeat echo 01/10/2023 LVEF 45-50%, WMA, apical akinesis showing slow flow but no clear thrombus, dilation aortic root 42 mm and ascending aorta 39 mm.  Recommend fluid restriction <2L, low salt diet. Continue metoprolol. BP previously not felt to tolerate ARB/ARNI.  NYHA I.  Given mild lower extremity edema we will transition his Lasix from as needed to once per week with additional dose as needed for edema.         Other   Hyperlipidemia    Stable chronic condition. Continue atorvastatin 80 mg daily.      Patient Instructions  Mantenimiento de la salud despus de los 65 aos de edad Health Maintenance After Age 82 Despus de los 65 aos de edad, corre un riesgo mayor de Film/video editor enfermedades e infecciones a Air cabin crew, como tambin de sufrir lesiones por cadas. Las cadas son la causa principal de  las fracturas de huesos y  lesiones en la cabeza de personas mayores de 65 aos de edad. Recibir cuidados preventivos de forma regular puede ayudarlo a mantenerse saludable y en buen [place]. Los cuidados preventivos incluyen realizarse anlisis de forma regular y Forensic psychologist en el estilo de vida segn las recomendaciones del mdico. Converse con el mdico sobre lo siguiente: Las pruebas de deteccin y los anlisis que debe International aid/development worker. Una prueba de deteccin es un estudio que se para Engineer, manufacturing la presencia de una enfermedad cuando no tiene sntomas. Un plan de dieta y ejercicios adecuado para usted. Qu debo saber sobre las pruebas de deteccin y los anlisis para prevenir cadas? Realizarse pruebas de deteccin y [employer] es la mejor manera de Engineer, manufacturing un problema de salud de forma temprana. El diagnstico y tratamiento tempranos le brindan la mejor oportunidad de Chief Operating Officer las afecciones mdicas que son comunes despus de los 65 aos de edad. Ciertas afecciones y elecciones de estilo de vida pueden hacer que sea ms propenso a sufrir Engineer, site. El mdico puede recomendarle lo siguiente: Controles regulares de la visin. Una visin deficiente y afecciones como las cataratas pueden hacer que sea ms propenso a sufrir Engineer, site. Si [place] lentes, asegrese de obtener una receta actualizada si su visin cambia. Revisin de medicamentos. Revise regularmente con el mdico todos los medicamentos que toma, incluidos los medicamentos de [place]. Consulte al [employer] efectos secundarios que pueden hacer que sea ms propenso a sufrir Engineer, site. Informe al mdico si alguno de los medicamentos que toma lo hace sentir mareado o somnoliento. Controles de fuerza y equilibrio. El mdico puede recomendar ciertos estudios para controlar su fuerza y equilibrio al estar de pie, al caminar o al cambiar de posicin. Examen de los pies. El dolor y Materials engineer en los pies, como tambin no utilizar el calzado [place], pueden hacer que  sea ms propenso a sufrir Engineer, site. Pruebas de deteccin, que incluyen las siguientes: Pruebas de deteccin para la osteoporosis. La osteoporosis es una afeccin que hace que los huesos se tornen ms dbiles y se quiebren con ms facilidad. Pruebas de deteccin para la presin arterial. Los cambios en la presin arterial y los medicamentos para Chief Operating Officer la presin arterial pueden hacerlo sentir mareado. Prueba de deteccin de la depresin. Es ms probable que sufra una cada si tiene miedo a caerse, se siente deprimido o se siente incapaz de Probation officer. Prueba de deteccin de consumo de alcohol. Beber demasiado alcohol puede afectar su equilibrio y puede hacer que sea ms propenso a sufrir Engineer, site. Siga estas indicaciones en su casa: Estilo de vida No beba alcohol si: Su mdico le indica no hacerlo. Si bebe alcohol: Limite la cantidad que bebe a lo siguiente: De 0 a 1 medida por da para las mujeres. De 0 a 2 medidas por da para los hombres. Sepa cunta cantidad de alcohol hay en las bebidas que toma. En los [address], una medida equivale a una botella de cerveza de 12 oz (355 ml), un vaso de vino de 5 oz (148 ml) o un vaso de una bebida alcohlica de alta graduacin de 1 oz (44 ml). No consuma ningn producto que contenga nicotina o tabaco. Estos productos incluyen cigarrillos, tabaco para Theatre manager y aparatos de vapeo, como los Administrator, Civil Service. Si necesita ayuda para dejar de consumir estos productos, consulte al [employer]. Actividad  Siga un programa de ejercicio regular para mantenerse en forma. Esto lo ayudar a Radio producer  equilibrio. Consulte al mdico qu tipos de ejercicios son adecuados para usted. Si necesita un bastn o un andador, selo segn las recomendaciones del mdico. Utilice calzado con buen apoyo y suela antideslizante. Seguridad  Retire los [employer] puedan causar tropiezos tales como alfombras, cables u obstculos. Instale  equipos de seguridad, como barras para sostn en los baos y barandas de seguridad en las escaleras. Mantenga las habitaciones y los pasillos bien iluminados. Indicaciones generales Hable con el mdico sobre sus riesgos de sufrir una cada. Infrmele a su mdico si: Se cae. Asegrese de informarle a su mdico acerca de todas las cadas, incluso aquellas que parecen ser [employer]. Se siente mareado, cansado (tiene fatiga) o siente que pierde el equilibrio. Use los medicamentos de venta libre y los recetados solamente como se lo haya indicado el mdico. Estos incluyen suplementos. Siga una dieta sana y [place] un peso saludable. Una dieta saludable incluye productos lcteos descremados, carnes bajas en contenido de grasa ([place]), fibra de granos enteros, frijoles y [place] frutas y verduras. Mantngase al da con las vacunas. Realcese los estudios de rutina de la salud, dentales y de Wellsite geologist. Resumen Tener un estilo de vida saludable y recibir cuidados preventivos pueden ayudar a Research scientist (physical sciences) salud y el bienestar despus de los 65 aos de [place]. Realizarse pruebas de deteccin y [employer] es la mejor manera de Engineer, manufacturing un problema de salud de forma temprana y [place] a Automotive engineer una cada. El diagnstico y tratamiento tempranos le brindan la mejor oportunidad de Chief Operating Officer las afecciones mdicas ms comunes en las personas mayores de 65 aos de edad. Las cadas son la causa principal de las fracturas de huesos y lesiones en la cabeza de personas mayores de 65 aos de edad. Tome precauciones para evitar una cada en su casa. Trabaje con el mdico para saber qu cambios que puede hacer para mejorar su salud y [place], y para prevenir las cadas. Esta informacin no tiene Theme park manager el consejo del mdico. Asegrese de hacerle al mdico cualquier pregunta que tenga. Document Revised: 04/27/2021 Document Reviewed: 04/27/2021 Elsevier Patient Education  2024 Elsevier Inc.    Edwina Barth,  MD [facility]

## 2023-05-03 NOTE — Assessment & Plan Note (Signed)
Stable chronic condition Continue atorvastatin 80 mg daily. 

## 2023-05-03 NOTE — Assessment & Plan Note (Signed)
No complications. Continues warfarin with follow-up at the coagulation clinic

## 2023-05-03 NOTE — Assessment & Plan Note (Signed)
Echo 01/2022 mildly reduced LVEF 45-50%.  Repeat echo 01/10/2023 LVEF 45-50%, WMA, apical akinesis showing slow flow but no clear thrombus, dilation aortic root 42 mm and ascending aorta 39 mm.  Recommend fluid restriction <2L, low salt diet. Continue metoprolol. BP previously not felt to tolerate ARB/ARNI.  NYHA I.  Given mild lower extremity edema we will transition his Lasix from as needed to once per week with additional dose as needed for edema.

## 2023-05-03 NOTE — Patient Instructions (Signed)
Mantenimiento de la salud despus de los 65 aos de edad Health Maintenance After Age 80 Despus de los 65 aos de edad, corre un riesgo mayor de padecer ciertas enfermedades e infecciones a largo plazo, como tambin de sufrir lesiones por cadas. Las cadas son la causa principal de las fracturas de huesos y lesiones en la cabeza de personas mayores de 65 aos de edad. Recibir cuidados preventivos de forma regular puede ayudarlo a mantenerse saludable y en buen estado. Los cuidados preventivos incluyen realizarse anlisis de forma regular y realizar cambios en el estilo de vida segn las recomendaciones del mdico. Converse con el mdico sobre lo siguiente: Las pruebas de deteccin y los anlisis que debe realizarse. Una prueba de deteccin es un estudio que se para detectar la presencia de una enfermedad cuando no tiene sntomas. Un plan de dieta y ejercicios adecuado para usted. Qu debo saber sobre las pruebas de deteccin y los anlisis para prevenir cadas? Realizarse pruebas de deteccin y anlisis es la mejor manera de detectar un problema de salud de forma temprana. El diagnstico y tratamiento tempranos le brindan la mejor oportunidad de controlar las afecciones mdicas que son comunes despus de los 65 aos de edad. Ciertas afecciones y elecciones de estilo de vida pueden hacer que sea ms propenso a sufrir una cada. El mdico puede recomendarle lo siguiente: Controles regulares de la visin. Una visin deficiente y afecciones como las cataratas pueden hacer que sea ms propenso a sufrir una cada. Si usa lentes, asegrese de obtener una receta actualizada si su visin cambia. Revisin de medicamentos. Revise regularmente con el mdico todos los medicamentos que toma, incluidos los medicamentos de venta libre. Consulte al mdico sobre los efectos secundarios que pueden hacer que sea ms propenso a sufrir una cada. Informe al mdico si alguno de los medicamentos que toma lo hace sentir mareado o  somnoliento. Controles de fuerza y equilibrio. El mdico puede recomendar ciertos estudios para controlar su fuerza y equilibrio al estar de pie, al caminar o al cambiar de posicin. Examen de los pies. El dolor y el adormecimiento en los pies, como tambin no utilizar el calzado adecuado, pueden hacer que sea ms propenso a sufrir una cada. Pruebas de deteccin, que incluyen las siguientes: Pruebas de deteccin para la osteoporosis. La osteoporosis es una afeccin que hace que los huesos se tornen ms dbiles y se quiebren con ms facilidad. Pruebas de deteccin para la presin arterial. Los cambios en la presin arterial y los medicamentos para controlar la presin arterial pueden hacerlo sentir mareado. Prueba de deteccin de la depresin. Es ms probable que sufra una cada si tiene miedo a caerse, se siente deprimido o se siente incapaz de realizar actividades que sola hacer. Prueba de deteccin de consumo de alcohol. Beber demasiado alcohol puede afectar su equilibrio y puede hacer que sea ms propenso a sufrir una cada. Siga estas indicaciones en su casa: Estilo de vida No beba alcohol si: Su mdico le indica no hacerlo. Si bebe alcohol: Limite la cantidad que bebe a lo siguiente: De 0 a 1 medida por da para las mujeres. De 0 a 2 medidas por da para los hombres. Sepa cunta cantidad de alcohol hay en las bebidas que toma. En los [place], una medida equivale a una botella de cerveza de 12 oz (355 ml), un vaso de vino de 5 oz (148 ml) o un vaso de una bebida alcohlica de alta graduacin de 1 oz (44 ml). No consuma ningn producto que   contenga nicotina o tabaco. Estos productos incluyen cigarrillos, tabaco para mascar y aparatos de vapeo, como los cigarrillos electrnicos. Si necesita ayuda para dejar de consumir estos productos, consulte al mdico. Actividad  Siga un programa de ejercicio regular para mantenerse en forma. Esto lo ayudar a mantener el equilibrio. Consulte al  mdico qu tipos de ejercicios son adecuados para usted. Si necesita un bastn o un andador, selo segn las recomendaciones del mdico. Utilice calzado con buen apoyo y suela antideslizante. Seguridad  Retire los objetos que puedan causar tropiezos tales como alfombras, cables u obstculos. Instale equipos de seguridad, como barras para sostn en los baos y barandas de seguridad en las escaleras. Mantenga las habitaciones y los pasillos bien iluminados. Indicaciones generales Hable con el mdico sobre sus riesgos de sufrir una cada. Infrmele a su mdico si: Se cae. Asegrese de informarle a su mdico acerca de todas las cadas, incluso aquellas que parecen ser menores. Se siente mareado, cansado (tiene fatiga) o siente que pierde el equilibrio. Use los medicamentos de venta libre y los recetados solamente como se lo haya indicado el mdico. Estos incluyen suplementos. Siga una dieta sana y mantenga un peso saludable. Una dieta saludable incluye productos lcteos descremados, carnes bajas en contenido de grasa ([place]), fibra de granos enteros, frijoles y muchas frutas y verduras. Mantngase al da con las vacunas. Realcese los estudios de rutina de la salud, dentales y de la vista. Resumen Tener un estilo de vida saludable y recibir cuidados preventivos pueden ayudar a promover la salud y el bienestar despus de los 65 aos de edad. Realizarse pruebas de deteccin y anlisis es la mejor manera de detectar un problema de salud de forma temprana y ayudarlo a evitar una cada. El diagnstico y tratamiento tempranos le brindan la mejor oportunidad de controlar las afecciones mdicas ms comunes en las personas mayores de 65 aos de edad. Las cadas son la causa principal de las fracturas de huesos y lesiones en la cabeza de personas mayores de 65 aos de edad. Tome precauciones para evitar una cada en su casa. Trabaje con el mdico para saber qu cambios que puede hacer para mejorar su salud y  bienestar, y para prevenir las cadas. Esta informacin no tiene como fin reemplazar el consejo del mdico. Asegrese de hacerle al mdico cualquier pregunta que tenga. Document Revised: 04/27/2021 Document Reviewed: 04/27/2021 Elsevier Patient Education  2024 Elsevier Inc.  

## 2023-05-06 ENCOUNTER — Other Ambulatory Visit: Payer: Self-pay | Admitting: Emergency Medicine

## 2023-05-07 ENCOUNTER — Other Ambulatory Visit: Payer: Self-pay | Admitting: Emergency Medicine

## 2023-05-07 ENCOUNTER — Encounter: Payer: Self-pay | Admitting: Emergency Medicine

## 2023-05-07 NOTE — Telephone Encounter (Signed)
Does he take it once or twice a day?

## 2023-05-08 ENCOUNTER — Other Ambulatory Visit: Payer: Self-pay | Admitting: Emergency Medicine

## 2023-05-08 MED ORDER — LEVETIRACETAM 500 MG PO TABS: 500.0000 mg | ORAL_TABLET | Freq: Two times a day (BID) | ORAL | 3 refills | Status: DC

## 2023-05-08 NOTE — Telephone Encounter (Signed)
New prescription for Keppra 500 mg twice a day sent to pharmacy of record today.  Thanks.

## 2023-05-17 ENCOUNTER — Other Ambulatory Visit (HOSPITAL_BASED_OUTPATIENT_CLINIC_OR_DEPARTMENT_OTHER): Payer: Self-pay | Admitting: Cardiology

## 2023-05-17 ENCOUNTER — Other Ambulatory Visit: Payer: Self-pay | Admitting: Emergency Medicine

## 2023-05-17 ENCOUNTER — Other Ambulatory Visit: Payer: Self-pay | Admitting: *Deleted

## 2023-05-17 DIAGNOSIS — I10 Essential (primary) hypertension: Secondary | ICD-10-CM

## 2023-05-17 DIAGNOSIS — I513 Intracardiac thrombosis, not elsewhere classified: Secondary | ICD-10-CM

## 2023-05-17 DIAGNOSIS — R931 Abnormal findings on diagnostic imaging of heart and coronary circulation: Secondary | ICD-10-CM

## 2023-05-17 NOTE — Telephone Encounter (Signed)
Rx request sent to pharmacy.  

## 2023-05-31 ENCOUNTER — Ambulatory Visit: Payer: Medicare Other | Attending: Cardiology

## 2023-05-31 DIAGNOSIS — I63512 Cerebral infarction due to unspecified occlusion or stenosis of left middle cerebral artery: Secondary | ICD-10-CM | POA: Insufficient documentation

## 2023-05-31 DIAGNOSIS — Z5181 Encounter for therapeutic drug level monitoring: Secondary | ICD-10-CM | POA: Insufficient documentation

## 2023-05-31 LAB — POCT INR: INR: 2.5 (ref 2.0–3.0)

## 2023-05-31 NOTE — Patient Instructions (Signed)
Description   Continue taking Warfarin 1 tablet daily except 1/2 tablet on Tuesdays and Fridays.  Recheck INR in 6 weeks.  Coumadin Clinic [phone] or [phone]

## 2023-06-01 ENCOUNTER — Ambulatory Visit (HOSPITAL_BASED_OUTPATIENT_CLINIC_OR_DEPARTMENT_OTHER): Payer: Medicare Other | Admitting: Cardiology

## 2023-06-16 ENCOUNTER — Other Ambulatory Visit: Payer: Self-pay | Admitting: Cardiology

## 2023-06-16 DIAGNOSIS — Z5181 Encounter for therapeutic drug level monitoring: Secondary | ICD-10-CM

## 2023-06-16 DIAGNOSIS — I63512 Cerebral infarction due to unspecified occlusion or stenosis of left middle cerebral artery: Secondary | ICD-10-CM

## 2023-06-16 DIAGNOSIS — I513 Intracardiac thrombosis, not elsewhere classified: Secondary | ICD-10-CM

## 2023-07-12 ENCOUNTER — Ambulatory Visit: Payer: Medicare Other

## 2023-07-19 ENCOUNTER — Ambulatory Visit: Payer: Medicare Other | Attending: Cardiology | Admitting: *Deleted

## 2023-07-19 DIAGNOSIS — I63512 Cerebral infarction due to unspecified occlusion or stenosis of left middle cerebral artery: Secondary | ICD-10-CM

## 2023-07-19 DIAGNOSIS — I513 Intracardiac thrombosis, not elsewhere classified: Secondary | ICD-10-CM | POA: Diagnosis not present

## 2023-07-19 LAB — POCT INR: INR: 4.5 — AB (ref 2.0–3.0)

## 2023-07-19 NOTE — Patient Instructions (Addendum)
Description   Do not take any warfarin today and no warfarin tomorrow then continue taking Warfarin 1 tablet daily except 1/2 tablet on Tuesdays and Fridays.  Recheck INR in 2 weeks (normally 6 weeks).  Coumadin Clinic [phone] or [phone]

## 2023-07-27 ENCOUNTER — Other Ambulatory Visit: Payer: Self-pay | Admitting: Emergency Medicine

## 2023-07-31 ENCOUNTER — Ambulatory Visit: Payer: Medicare Other

## 2023-07-31 DIAGNOSIS — Z5181 Encounter for therapeutic drug level monitoring: Secondary | ICD-10-CM | POA: Insufficient documentation

## 2023-07-31 DIAGNOSIS — I63512 Cerebral infarction due to unspecified occlusion or stenosis of left middle cerebral artery: Secondary | ICD-10-CM | POA: Diagnosis present

## 2023-07-31 DIAGNOSIS — I513 Intracardiac thrombosis, not elsewhere classified: Secondary | ICD-10-CM | POA: Insufficient documentation

## 2023-07-31 LAB — POCT INR: INR: 3.3 — AB (ref 2.0–3.0)

## 2023-07-31 NOTE — Patient Instructions (Signed)
Do not take any warfarin today then decrease to 1 tablet daily except 1/2 tablet on Sundays, Tuesdays and Fridays.  Recheck INR in 3 weeks (normally 6 weeks).  Coumadin Clinic [phone] or [phone]

## 2023-08-12 ENCOUNTER — Other Ambulatory Visit: Payer: Self-pay | Admitting: Emergency Medicine

## 2023-08-21 ENCOUNTER — Ambulatory Visit: Payer: Medicare Other | Attending: Cardiology

## 2023-08-21 DIAGNOSIS — I513 Intracardiac thrombosis, not elsewhere classified: Secondary | ICD-10-CM | POA: Diagnosis not present

## 2023-08-21 DIAGNOSIS — Z5181 Encounter for therapeutic drug level monitoring: Secondary | ICD-10-CM

## 2023-08-21 DIAGNOSIS — I63512 Cerebral infarction due to unspecified occlusion or stenosis of left middle cerebral artery: Secondary | ICD-10-CM | POA: Diagnosis present

## 2023-08-21 LAB — POCT INR: INR: 3.3 — AB (ref 2.0–3.0)

## 2023-08-21 NOTE — Patient Instructions (Signed)
HOLD TODAY ONLY THEN CONTINUE 1 tablet daily except 1/2 tablet on Sundays, Tuesdays and Fridays.  Recheck INR in 3 weeks (normally 6 weeks).  Coumadin Clinic [phone] or [phone]

## 2023-08-23 ENCOUNTER — Ambulatory Visit (HOSPITAL_BASED_OUTPATIENT_CLINIC_OR_DEPARTMENT_OTHER): Payer: Medicare Other | Admitting: Family

## 2023-09-11 ENCOUNTER — Ambulatory Visit: Payer: Medicare Other | Attending: Cardiovascular Disease | Admitting: *Deleted

## 2023-09-11 DIAGNOSIS — I63512 Cerebral infarction due to unspecified occlusion or stenosis of left middle cerebral artery: Secondary | ICD-10-CM | POA: Insufficient documentation

## 2023-09-11 DIAGNOSIS — Z5181 Encounter for therapeutic drug level monitoring: Secondary | ICD-10-CM | POA: Insufficient documentation

## 2023-09-11 DIAGNOSIS — I513 Intracardiac thrombosis, not elsewhere classified: Secondary | ICD-10-CM | POA: Insufficient documentation

## 2023-09-11 LAB — POCT INR: INR: 2.2 (ref 2.0–3.0)

## 2023-09-11 NOTE — Patient Instructions (Signed)
Description   Continue taking warfarin 1 tablet daily except 1/2 tablet on Sundays, Tuesdays and Fridays.  Recheck INR in 3 weeks (normally 6 weeks).  Coumadin Clinic [phone] or [phone]

## 2023-09-25 ENCOUNTER — Ambulatory Visit (HOSPITAL_BASED_OUTPATIENT_CLINIC_OR_DEPARTMENT_OTHER): Payer: Medicare Other | Admitting: Family

## 2023-09-25 ENCOUNTER — Encounter (HOSPITAL_BASED_OUTPATIENT_CLINIC_OR_DEPARTMENT_OTHER): Payer: Self-pay | Admitting: Family

## 2023-09-25 VITALS — BP 118/60 | HR 64 | Resp 16 | Ht 67.0 in | Wt 173.0 lb

## 2023-09-25 DIAGNOSIS — I5022 Chronic systolic (congestive) heart failure: Secondary | ICD-10-CM

## 2023-09-25 DIAGNOSIS — D6859 Other primary thrombophilia: Secondary | ICD-10-CM

## 2023-09-25 DIAGNOSIS — I25118 Atherosclerotic heart disease of native coronary artery with other forms of angina pectoris: Secondary | ICD-10-CM

## 2023-09-25 DIAGNOSIS — E785 Hyperlipidemia, unspecified: Secondary | ICD-10-CM

## 2023-09-25 NOTE — Patient Instructions (Addendum)
Medication Instructions/Medicamentos  Continue your current medications.  Continua la misma medicamentos.  *If you need a refill on your cardiac medications before your next appointment, please call your pharmacy*   Lab Work/Laboratorios: Your physician recommends that you return for lab work today: CMP, CBC, direct LDL  Laboratorios hoy: CMP, CBC, direct LDL  If you have labs (blood work) drawn today and your tests are completely normal, you will receive your results only by: MyChart Message (if you have MyChart) OR A paper copy in the mail If you have any lab test that is abnormal or we need to change your treatment, we will call you to review the results.  Follow-Up: At [facility], you and your health needs are our priority.  As part of our continuing mission to provide you with exceptional heart care, we have created designated Provider Care Teams.  These Care Teams include your primary Cardiologist (physician) and Advanced Practice Providers (APPs -  Physician Assistants and Nurse Practitioners) who all work together to provide you with the care you need, when you need it.  We recommend signing up for the patient portal called "MyChart".  Sign up information is provided on this After Visit Summary.  MyChart is used to connect with patients for Virtual Visits (Telemedicine).  Patients are able to view lab/test results, encounter notes, upcoming appointments, etc.  Non-urgent messages can be sent to your provider as well.   To learn more about what you can do with MyChart, go to [email or web address].    Your next appointment/Tu proxima cita   In 6 months with Roberto Sorrow, NP En seis meces con Roberto Sorrow, NP

## 2023-09-25 NOTE — Progress Notes (Signed)
Cardiology Office Note:  .   Date:  09/25/2023  ID:  Roberto Dickson, DOB 01/25/1941, MRN [id number] PCP: [name], MD  [facility] Providers Cardiologist:  Jodelle Red, MD    History of Present Illness: .   Roberto Dickson is a 81 y.o. male  with a hx of CVA, HTN, HLD, LV thrombus, HFrEF, CAD.   He was admitted 07/26/2021 - 08/10/2021 with acute ischemic left middle cerebral artery (MCA) stroke.  He received tPA and additionally underwent cerebral angiography with revascularization of left ICA from proximal to distal terminus and origin of left MCA.  Echocardiogram with LVEF 50 to 55% with apical thrombus and Coumadin started.  There were wall motion abnormalities noted by echocardiogram but this was recommended for outpatient duration.[name]  He was recommended for CIR.   Seen in follow up 08/31/21, stress test was recommended due to wall motion abnormalities by echo. Stress test 09/06/21 with large size, severe severity mid to apical inferior mostly fixed perfusion defect suggestive of large RCA territory scar with peri-infarct ischemia. LVEF read as 29% but suspected to be falsely low.   Cardiac CT 09/2021 calcium score 6053 and significant three vessel disease with likely CTO of LAD. Subsequent cardiac cath 10/20/21 with severe triple vessel CAD (CTO mid LAD, distal LAD diffusely diseased filing with L-L collaterals, moderate caliber diagonal branch severe disease, severe disease Cx and first obtuse marginal, CTO dominant RCA with L-R collaterals). PCI not an option. He met with Dr. Cliffton Asters of [facility] and decided to proceed with medical management. Repeat echocardiogram 01/13/22 with LVEF 45-50%, RWMA, mild LVH, gr1DD, dilation aortic root 41mm and ascending aorta 43mm. There was swirling artifact at apex suggesting slow flow but previously LV thrombus resolved. Given akinetic apex, recommended to continue anticoagulation.    At follow up 01/2022 and 04/2022 he was doing  well from a cardiac perspective. Losartan was trialed for optimization of GDMT but was stopped due to rash which ended up being shingles. However, as BP well controlled on Amlodipine and asymptomatic in regards to his heart failure it was not resumed.    Seen 11/2021 doing well from a cardiac perspective and no changes made at that time.  Subsequently admitted 01/2023 for respiratory failure in the setting of COVID-19 infection.  Updated echocardiogram 01/10/23 LVEF 45-50%, WMA, moderate LVH. He was volume overloaded at visit 01/24/23 and started on lasix x 2 days and then PRN.    Last seen 03/22/23 at which time Lasix was transitioned from PRN to weekly with additional dose PRN for mild LE edema.   Presents today for follow-up with his daughter.  Visit assisted by interpreter.  Weight down 3 lbs from last clinic visit. Notes LE edema has been well-controlled with once weekly Lasix.  Has not required as needed dosing.  No exertional dyspnea, orthopnea, PND, chest pain.  Does note episode about 6 weeks ago where he came out of the shower and had 2 fainting spells which she did not recall.  EMS felt he was dehydrated and gave IV fluids but did not require hospitalization.  We discussed if BP persistently low at home could consider reducing dose of amlodipine in the future.  He is drinking only 1 L of fluid per day encouraged increased release 1.5 L/day but not exceed 2 L/day.  Notes his grandson is planning to study in [place] and he, his wife, his daughter are considering move to [place] versus move outside of [place].  We discussed  Cardiology Office Note:  .   Date:  09/25/2023  ID:  Roberto Dickson, DOB 01/25/1941, MRN [id number] PCP: [name], MD  [facility] Providers Cardiologist:  Jodelle Red, MD    History of Present Illness: .   Roberto Dickson is a 81 y.o. male  with a hx of CVA, HTN, HLD, LV thrombus, HFrEF, CAD.   He was admitted 07/26/2021 - 08/10/2021 with acute ischemic left middle cerebral artery (MCA) stroke.  He received tPA and additionally underwent cerebral angiography with revascularization of left ICA from proximal to distal terminus and origin of left MCA.  Echocardiogram with LVEF 50 to 55% with apical thrombus and Coumadin started.  There were wall motion abnormalities noted by echocardiogram but this was recommended for outpatient duration.[name]  He was recommended for CIR.   Seen in follow up 08/31/21, stress test was recommended due to wall motion abnormalities by echo. Stress test 09/06/21 with large size, severe severity mid to apical inferior mostly fixed perfusion defect suggestive of large RCA territory scar with peri-infarct ischemia. LVEF read as 29% but suspected to be falsely low.   Cardiac CT 09/2021 calcium score 6053 and significant three vessel disease with likely CTO of LAD. Subsequent cardiac cath 10/20/21 with severe triple vessel CAD (CTO mid LAD, distal LAD diffusely diseased filing with L-L collaterals, moderate caliber diagonal branch severe disease, severe disease Cx and first obtuse marginal, CTO dominant RCA with L-R collaterals). PCI not an option. He met with Dr. Cliffton Asters of [facility] and decided to proceed with medical management. Repeat echocardiogram 01/13/22 with LVEF 45-50%, RWMA, mild LVH, gr1DD, dilation aortic root 41mm and ascending aorta 43mm. There was swirling artifact at apex suggesting slow flow but previously LV thrombus resolved. Given akinetic apex, recommended to continue anticoagulation.    At follow up 01/2022 and 04/2022 he was doing  well from a cardiac perspective. Losartan was trialed for optimization of GDMT but was stopped due to rash which ended up being shingles. However, as BP well controlled on Amlodipine and asymptomatic in regards to his heart failure it was not resumed.    Seen 11/2021 doing well from a cardiac perspective and no changes made at that time.  Subsequently admitted 01/2023 for respiratory failure in the setting of COVID-19 infection.  Updated echocardiogram 01/10/23 LVEF 45-50%, WMA, moderate LVH. He was volume overloaded at visit 01/24/23 and started on lasix x 2 days and then PRN.    Last seen 03/22/23 at which time Lasix was transitioned from PRN to weekly with additional dose PRN for mild LE edema.   Presents today for follow-up with his daughter.  Visit assisted by interpreter.  Weight down 3 lbs from last clinic visit. Notes LE edema has been well-controlled with once weekly Lasix.  Has not required as needed dosing.  No exertional dyspnea, orthopnea, PND, chest pain.  Does note episode about 6 weeks ago where he came out of the shower and had 2 fainting spells which she did not recall.  EMS felt he was dehydrated and gave IV fluids but did not require hospitalization.  We discussed if BP persistently low at home could consider reducing dose of amlodipine in the future.  He is drinking only 1 L of fluid per day encouraged increased release 1.5 L/day but not exceed 2 L/day.  Notes his grandson is planning to study in [place] and he, his wife, his daughter are considering move to [place] versus move outside of [place].  We discussed  infarction and peri-infarct ischemia.   Left ventricular function is abnormal. Global function is severely reduced. There was a single regional abnormality. Nuclear stress EF: 29 %. The left ventricular ejection fraction is severely decreased (<30%). End diastolic cavity size is mildly enlarged. End systolic cavity size is mildly enlarged.   Prior study not available for comparison.  Large size, severe severity mid to apical inferior mostly fixed perfusion defect, suggestive of large RCA territory scar with peri-infarct ischemia (SDS 5). LVEF 29% with inferior and apical akinesis (suspect LVEF is falsely low, recommend echo correlation). This is a high risk study. No prior for comparison.   ECHOCARDIOGRAM  ECHOCARDIOGRAM COMPLETE 01/10/2023  Narrative ECHOCARDIOGRAM REPORT    Patient Name:   Roberto Dickson Date of  Exam: 01/10/2023 Medical Rec #:  [id number]         Height:       67.0 in Accession #:    [id number]        Weight:       162.0 lb Date of Birth:  08/26/1941         BSA:          1.849 m Patient Age:    82 years          BP:           102/69 mmHg Patient Gender: M                 HR:           69 bpm. Exam Location:  Inpatient  Procedure: 2D Echo, Cardiac Doppler, Color Doppler and Intracardiac Opacification Agent  Indications:     Elevated Troponin  History:         Patient has prior history of Echocardiogram examinations, most recent 01/13/2022. CAD, Stroke; Risk Factors:Hypertension and Dyslipidemia. LV Thrombus, COVID +.  Sonographer:     Mikki Harbor Referring Phys:  [id number] DAWOOD Teena Irani Diagnosing Phys: Epifanio Lesches MD  IMPRESSIONS   1. Left ventricular ejection fraction, by estimation, is 45 to 50%. The left ventricle has mildly decreased function. The left ventricle demonstrates regional wall motion abnormalities (see scoring diagram/findings for description). There is moderate asymmetric left ventricular hypertrophy of the basal-septal segment. Left ventricular diastolic parameters are consistent with Grade I diastolic dysfunction (impaired relaxation). 2. Apical akinesis with contrast images showing swirling artifact at apex consistent with slow flow but no clear thrombus seen 3. Right ventricular systolic function is normal. The right ventricular size is normal. 4. Left atrial size was mildly dilated. 5. The mitral valve is normal in structure. No evidence of mitral valve regurgitation. No evidence of mitral stenosis. 6. The aortic valve was not well visualized. Aortic valve regurgitation is not visualized. No aortic stenosis is present. 7. Aortic dilatation noted. There is dilatation of the aortic root, measuring 42 mm. There is dilatation of the ascending aorta, measuring 39 mm.  FINDINGS Left Ventricle: Left ventricular ejection fraction, by estimation, is  45 to 50%. The left ventricle has mildly decreased function. The left ventricle demonstrates regional wall motion abnormalities. Definity contrast agent was given IV to delineate the left ventricular endocardial borders. The left ventricular internal cavity size was small. There is moderate asymmetric left ventricular hypertrophy of the basal-septal segment. Left ventricular diastolic parameters are consistent with Grade I diastolic dysfunction (impaired relaxation).   LV Wall Scoring: The apical septal segment, apical inferior segment, and apex are akinetic. The entire anterior wall, entire lateral wall, anterior septum, inferior wall,  infarction and peri-infarct ischemia.   Left ventricular function is abnormal. Global function is severely reduced. There was a single regional abnormality. Nuclear stress EF: 29 %. The left ventricular ejection fraction is severely decreased (<30%). End diastolic cavity size is mildly enlarged. End systolic cavity size is mildly enlarged.   Prior study not available for comparison.  Large size, severe severity mid to apical inferior mostly fixed perfusion defect, suggestive of large RCA territory scar with peri-infarct ischemia (SDS 5). LVEF 29% with inferior and apical akinesis (suspect LVEF is falsely low, recommend echo correlation). This is a high risk study. No prior for comparison.   ECHOCARDIOGRAM  ECHOCARDIOGRAM COMPLETE 01/10/2023  Narrative ECHOCARDIOGRAM REPORT    Patient Name:   Roberto Dickson Date of  Exam: 01/10/2023 Medical Rec #:  [id number]         Height:       67.0 in Accession #:    [id number]        Weight:       162.0 lb Date of Birth:  08/26/1941         BSA:          1.849 m Patient Age:    82 years          BP:           102/69 mmHg Patient Gender: M                 HR:           69 bpm. Exam Location:  Inpatient  Procedure: 2D Echo, Cardiac Doppler, Color Doppler and Intracardiac Opacification Agent  Indications:     Elevated Troponin  History:         Patient has prior history of Echocardiogram examinations, most recent 01/13/2022. CAD, Stroke; Risk Factors:Hypertension and Dyslipidemia. LV Thrombus, COVID +.  Sonographer:     Mikki Harbor Referring Phys:  [id number] DAWOOD Teena Irani Diagnosing Phys: Epifanio Lesches MD  IMPRESSIONS   1. Left ventricular ejection fraction, by estimation, is 45 to 50%. The left ventricle has mildly decreased function. The left ventricle demonstrates regional wall motion abnormalities (see scoring diagram/findings for description). There is moderate asymmetric left ventricular hypertrophy of the basal-septal segment. Left ventricular diastolic parameters are consistent with Grade I diastolic dysfunction (impaired relaxation). 2. Apical akinesis with contrast images showing swirling artifact at apex consistent with slow flow but no clear thrombus seen 3. Right ventricular systolic function is normal. The right ventricular size is normal. 4. Left atrial size was mildly dilated. 5. The mitral valve is normal in structure. No evidence of mitral valve regurgitation. No evidence of mitral stenosis. 6. The aortic valve was not well visualized. Aortic valve regurgitation is not visualized. No aortic stenosis is present. 7. Aortic dilatation noted. There is dilatation of the aortic root, measuring 42 mm. There is dilatation of the ascending aorta, measuring 39 mm.  FINDINGS Left Ventricle: Left ventricular ejection fraction, by estimation, is  45 to 50%. The left ventricle has mildly decreased function. The left ventricle demonstrates regional wall motion abnormalities. Definity contrast agent was given IV to delineate the left ventricular endocardial borders. The left ventricular internal cavity size was small. There is moderate asymmetric left ventricular hypertrophy of the basal-septal segment. Left ventricular diastolic parameters are consistent with Grade I diastolic dysfunction (impaired relaxation).   LV Wall Scoring: The apical septal segment, apical inferior segment, and apex are akinetic. The entire anterior wall, entire lateral wall, anterior septum, inferior wall,  Cardiology Office Note:  .   Date:  09/25/2023  ID:  Roberto Dickson, DOB 01/25/1941, MRN [id number] PCP: [name], MD  [facility] Providers Cardiologist:  Jodelle Red, MD    History of Present Illness: .   Roberto Dickson is a 81 y.o. male  with a hx of CVA, HTN, HLD, LV thrombus, HFrEF, CAD.   He was admitted 07/26/2021 - 08/10/2021 with acute ischemic left middle cerebral artery (MCA) stroke.  He received tPA and additionally underwent cerebral angiography with revascularization of left ICA from proximal to distal terminus and origin of left MCA.  Echocardiogram with LVEF 50 to 55% with apical thrombus and Coumadin started.  There were wall motion abnormalities noted by echocardiogram but this was recommended for outpatient duration.[name]  He was recommended for CIR.   Seen in follow up 08/31/21, stress test was recommended due to wall motion abnormalities by echo. Stress test 09/06/21 with large size, severe severity mid to apical inferior mostly fixed perfusion defect suggestive of large RCA territory scar with peri-infarct ischemia. LVEF read as 29% but suspected to be falsely low.   Cardiac CT 09/2021 calcium score 6053 and significant three vessel disease with likely CTO of LAD. Subsequent cardiac cath 10/20/21 with severe triple vessel CAD (CTO mid LAD, distal LAD diffusely diseased filing with L-L collaterals, moderate caliber diagonal branch severe disease, severe disease Cx and first obtuse marginal, CTO dominant RCA with L-R collaterals). PCI not an option. He met with Dr. Cliffton Asters of [facility] and decided to proceed with medical management. Repeat echocardiogram 01/13/22 with LVEF 45-50%, RWMA, mild LVH, gr1DD, dilation aortic root 41mm and ascending aorta 43mm. There was swirling artifact at apex suggesting slow flow but previously LV thrombus resolved. Given akinetic apex, recommended to continue anticoagulation.    At follow up 01/2022 and 04/2022 he was doing  well from a cardiac perspective. Losartan was trialed for optimization of GDMT but was stopped due to rash which ended up being shingles. However, as BP well controlled on Amlodipine and asymptomatic in regards to his heart failure it was not resumed.    Seen 11/2021 doing well from a cardiac perspective and no changes made at that time.  Subsequently admitted 01/2023 for respiratory failure in the setting of COVID-19 infection.  Updated echocardiogram 01/10/23 LVEF 45-50%, WMA, moderate LVH. He was volume overloaded at visit 01/24/23 and started on lasix x 2 days and then PRN.    Last seen 03/22/23 at which time Lasix was transitioned from PRN to weekly with additional dose PRN for mild LE edema.   Presents today for follow-up with his daughter.  Visit assisted by interpreter.  Weight down 3 lbs from last clinic visit. Notes LE edema has been well-controlled with once weekly Lasix.  Has not required as needed dosing.  No exertional dyspnea, orthopnea, PND, chest pain.  Does note episode about 6 weeks ago where he came out of the shower and had 2 fainting spells which she did not recall.  EMS felt he was dehydrated and gave IV fluids but did not require hospitalization.  We discussed if BP persistently low at home could consider reducing dose of amlodipine in the future.  He is drinking only 1 L of fluid per day encouraged increased release 1.5 L/day but not exceed 2 L/day.  Notes his grandson is planning to study in [place] and he, his wife, his daughter are considering move to [place] versus move outside of [place].  We discussed  Cardiology Office Note:  .   Date:  09/25/2023  ID:  Roberto Dickson, DOB 01/25/1941, MRN [id number] PCP: [name], MD  [facility] Providers Cardiologist:  Jodelle Red, MD    History of Present Illness: .   Roberto Dickson is a 81 y.o. male  with a hx of CVA, HTN, HLD, LV thrombus, HFrEF, CAD.   He was admitted 07/26/2021 - 08/10/2021 with acute ischemic left middle cerebral artery (MCA) stroke.  He received tPA and additionally underwent cerebral angiography with revascularization of left ICA from proximal to distal terminus and origin of left MCA.  Echocardiogram with LVEF 50 to 55% with apical thrombus and Coumadin started.  There were wall motion abnormalities noted by echocardiogram but this was recommended for outpatient duration.[name]  He was recommended for CIR.   Seen in follow up 08/31/21, stress test was recommended due to wall motion abnormalities by echo. Stress test 09/06/21 with large size, severe severity mid to apical inferior mostly fixed perfusion defect suggestive of large RCA territory scar with peri-infarct ischemia. LVEF read as 29% but suspected to be falsely low.   Cardiac CT 09/2021 calcium score 6053 and significant three vessel disease with likely CTO of LAD. Subsequent cardiac cath 10/20/21 with severe triple vessel CAD (CTO mid LAD, distal LAD diffusely diseased filing with L-L collaterals, moderate caliber diagonal branch severe disease, severe disease Cx and first obtuse marginal, CTO dominant RCA with L-R collaterals). PCI not an option. He met with Dr. Cliffton Asters of [facility] and decided to proceed with medical management. Repeat echocardiogram 01/13/22 with LVEF 45-50%, RWMA, mild LVH, gr1DD, dilation aortic root 41mm and ascending aorta 43mm. There was swirling artifact at apex suggesting slow flow but previously LV thrombus resolved. Given akinetic apex, recommended to continue anticoagulation.    At follow up 01/2022 and 04/2022 he was doing  well from a cardiac perspective. Losartan was trialed for optimization of GDMT but was stopped due to rash which ended up being shingles. However, as BP well controlled on Amlodipine and asymptomatic in regards to his heart failure it was not resumed.    Seen 11/2021 doing well from a cardiac perspective and no changes made at that time.  Subsequently admitted 01/2023 for respiratory failure in the setting of COVID-19 infection.  Updated echocardiogram 01/10/23 LVEF 45-50%, WMA, moderate LVH. He was volume overloaded at visit 01/24/23 and started on lasix x 2 days and then PRN.    Last seen 03/22/23 at which time Lasix was transitioned from PRN to weekly with additional dose PRN for mild LE edema.   Presents today for follow-up with his daughter.  Visit assisted by interpreter.  Weight down 3 lbs from last clinic visit. Notes LE edema has been well-controlled with once weekly Lasix.  Has not required as needed dosing.  No exertional dyspnea, orthopnea, PND, chest pain.  Does note episode about 6 weeks ago where he came out of the shower and had 2 fainting spells which she did not recall.  EMS felt he was dehydrated and gave IV fluids but did not require hospitalization.  We discussed if BP persistently low at home could consider reducing dose of amlodipine in the future.  He is drinking only 1 L of fluid per day encouraged increased release 1.5 L/day but not exceed 2 L/day.  Notes his grandson is planning to study in [place] and he, his wife, his daughter are considering move to [place] versus move outside of [place].  We discussed  Cardiology Office Note:  .   Date:  09/25/2023  ID:  Roberto Dickson, DOB 01/25/1941, MRN [id number] PCP: [name], MD  [facility] Providers Cardiologist:  Jodelle Red, MD    History of Present Illness: .   Roberto Dickson is a 81 y.o. male  with a hx of CVA, HTN, HLD, LV thrombus, HFrEF, CAD.   He was admitted 07/26/2021 - 08/10/2021 with acute ischemic left middle cerebral artery (MCA) stroke.  He received tPA and additionally underwent cerebral angiography with revascularization of left ICA from proximal to distal terminus and origin of left MCA.  Echocardiogram with LVEF 50 to 55% with apical thrombus and Coumadin started.  There were wall motion abnormalities noted by echocardiogram but this was recommended for outpatient duration.[name]  He was recommended for CIR.   Seen in follow up 08/31/21, stress test was recommended due to wall motion abnormalities by echo. Stress test 09/06/21 with large size, severe severity mid to apical inferior mostly fixed perfusion defect suggestive of large RCA territory scar with peri-infarct ischemia. LVEF read as 29% but suspected to be falsely low.   Cardiac CT 09/2021 calcium score 6053 and significant three vessel disease with likely CTO of LAD. Subsequent cardiac cath 10/20/21 with severe triple vessel CAD (CTO mid LAD, distal LAD diffusely diseased filing with L-L collaterals, moderate caliber diagonal branch severe disease, severe disease Cx and first obtuse marginal, CTO dominant RCA with L-R collaterals). PCI not an option. He met with Dr. Cliffton Asters of [facility] and decided to proceed with medical management. Repeat echocardiogram 01/13/22 with LVEF 45-50%, RWMA, mild LVH, gr1DD, dilation aortic root 41mm and ascending aorta 43mm. There was swirling artifact at apex suggesting slow flow but previously LV thrombus resolved. Given akinetic apex, recommended to continue anticoagulation.    At follow up 01/2022 and 04/2022 he was doing  well from a cardiac perspective. Losartan was trialed for optimization of GDMT but was stopped due to rash which ended up being shingles. However, as BP well controlled on Amlodipine and asymptomatic in regards to his heart failure it was not resumed.    Seen 11/2021 doing well from a cardiac perspective and no changes made at that time.  Subsequently admitted 01/2023 for respiratory failure in the setting of COVID-19 infection.  Updated echocardiogram 01/10/23 LVEF 45-50%, WMA, moderate LVH. He was volume overloaded at visit 01/24/23 and started on lasix x 2 days and then PRN.    Last seen 03/22/23 at which time Lasix was transitioned from PRN to weekly with additional dose PRN for mild LE edema.   Presents today for follow-up with his daughter.  Visit assisted by interpreter.  Weight down 3 lbs from last clinic visit. Notes LE edema has been well-controlled with once weekly Lasix.  Has not required as needed dosing.  No exertional dyspnea, orthopnea, PND, chest pain.  Does note episode about 6 weeks ago where he came out of the shower and had 2 fainting spells which she did not recall.  EMS felt he was dehydrated and gave IV fluids but did not require hospitalization.  We discussed if BP persistently low at home could consider reducing dose of amlodipine in the future.  He is drinking only 1 L of fluid per day encouraged increased release 1.5 L/day but not exceed 2 L/day.  Notes his grandson is planning to study in [place] and he, his wife, his daughter are considering move to [place] versus move outside of [place].  We discussed

## 2023-09-26 LAB — COMPREHENSIVE METABOLIC PANEL
ALT: 45 [IU]/L — ABNORMAL HIGH (ref 0–44)
AST: 42 [IU]/L — ABNORMAL HIGH (ref 0–40)
Albumin: 4 g/dL (ref 3.8–4.8)
Alkaline Phosphatase: 98 [IU]/L (ref 44–121)
BUN/Creatinine Ratio: 21 (ref 10–24)
BUN: 32 mg/dL — ABNORMAL HIGH (ref 8–27)
Bilirubin Total: 0.7 mg/dL (ref 0.0–1.2)
CO2: 21 mmol/L (ref 20–29)
Calcium: 8.7 mg/dL (ref 8.6–10.2)
Chloride: 109 mmol/L — ABNORMAL HIGH (ref 96–106)
Creatinine, Ser: 1.56 mg/dL — ABNORMAL HIGH (ref 0.76–1.27)
Globulin, Total: 2.2 g/dL (ref 1.5–4.5)
Glucose: 100 mg/dL — ABNORMAL HIGH (ref 70–99)
Potassium: 4.6 mmol/L (ref 3.5–5.2)
Sodium: 143 mmol/L (ref 134–144)
Total Protein: 6.2 g/dL (ref 6.0–8.5)
eGFR: 45 mL/min/{1.73_m2} — ABNORMAL LOW (ref >59)

## 2023-09-26 LAB — CBC
Hematocrit: 41.3 % (ref 37.5–51.0)
Hemoglobin: 13.4 g/dL (ref 13.0–17.7)
MCH: 31.2 pg (ref 26.6–33.0)
MCHC: 32.4 g/dL (ref 31.5–35.7)
MCV: 96 fL (ref 79–97)
Platelets: 220 10*3/uL (ref 150–450)
RBC: 4.29 x10E6/uL (ref 4.14–5.80)
RDW: 13.8 % (ref 11.6–15.4)
WBC: 6.6 10*3/uL (ref 3.4–10.8)

## 2023-09-26 LAB — LDL CHOLESTEROL, DIRECT: LDL Direct: 71 mg/dL (ref 0–99)

## 2023-09-27 ENCOUNTER — Telehealth (HOSPITAL_BASED_OUTPATIENT_CLINIC_OR_DEPARTMENT_OTHER): Payer: Self-pay

## 2023-09-27 DIAGNOSIS — E785 Hyperlipidemia, unspecified: Secondary | ICD-10-CM

## 2023-09-27 NOTE — Telephone Encounter (Addendum)
Called patient using Interpre[employer] services ID # [id number], Called results to patient and left results on VM (ok per DPR), instructions left to call office back if patient has any questions!     ----- Message from Alver Sorrow sent at 09/27/2023  7:53 AM EDT ----- Stable kidney function. Liver enzymes very mildly elevated, recommend reducing intake of alcohol, acetaminophen (Tylenol), fried foods. Repeat liver enzymes in 3 months for monitoring. Cholesterol looks fantastic. CBC with no evidence of anemia nor infection.  Overall good result.

## 2023-09-29 ENCOUNTER — Other Ambulatory Visit: Payer: Self-pay | Admitting: Emergency Medicine

## 2023-10-02 ENCOUNTER — Ambulatory Visit: Payer: Medicare Other | Attending: Internal Medicine | Admitting: *Deleted

## 2023-10-02 DIAGNOSIS — I63512 Cerebral infarction due to unspecified occlusion or stenosis of left middle cerebral artery: Secondary | ICD-10-CM | POA: Diagnosis not present

## 2023-10-02 DIAGNOSIS — I513 Intracardiac thrombosis, not elsewhere classified: Secondary | ICD-10-CM | POA: Insufficient documentation

## 2023-10-02 DIAGNOSIS — Z5181 Encounter for therapeutic drug level monitoring: Secondary | ICD-10-CM | POA: Diagnosis not present

## 2023-10-02 LAB — POCT INR: INR: 3 (ref 2.0–3.0)

## 2023-10-02 NOTE — Patient Instructions (Signed)
Description   Continue taking warfarin 1 tablet daily except 1/2 tablet on Sundays, Tuesdays and Fridays.  Recheck INR in 4 weeks (normally 6 weeks).  [facility] [phone] or [phone]

## 2023-10-15 ENCOUNTER — Encounter (HOSPITAL_COMMUNITY): Payer: Self-pay | Admitting: Emergency Medicine

## 2023-10-15 ENCOUNTER — Ambulatory Visit (HOSPITAL_COMMUNITY)
Admission: EM | Admit: 2023-10-15 | Discharge: 2023-10-15 | Disposition: A | Discharge status: Home / Self Care | Payer: Medicare Other

## 2023-10-15 DIAGNOSIS — S61214A Laceration without foreign body of right ring finger without damage to nail, initial encounter: Secondary | ICD-10-CM

## 2023-10-15 DIAGNOSIS — S61216A Laceration without foreign body of right little finger without damage to nail, initial encounter: Secondary | ICD-10-CM | POA: Diagnosis not present

## 2023-10-15 NOTE — Discharge Instructions (Addendum)
Mantenga la mano elevada y evite usarla para ayudar a reducir el riesgo de sangrado recurrente, ya que est tomando anticoagulantes. Mantenga la zona limpia y seca. Debe esperar 24 horas antes de ducharse y mojar la zona. Despus, puede limpiar suavemente la zona con agua y [place] y secarla con palmaditas. No tire ni tire de las Steri-Strips. La piel y la herida debajo de ellas se estn curando. Las Steri-Strips generalmente se desprenden solas despus de 3 das. Despus de esto, puede limpiar la [employer] al da con agua y un [place]. Regrese a la clnica si presenta hinchazn, secrecin purulenta, vetas, calor o signos de infeccin.  Please keep his hand elevated and avoid using it to help reduce the risk of recurrent bleeding since he is on a blood thinner.  Keep the area clean and dry.  You should wait 24 hours before showering and getting the area wet.  Afterwards you can gently clean the area with mild soap and water and pat dry.  Do not pull or tug at the Steri-Strips.  The skin and and wound is healing underneath of it.  Steri-Strips will usually come off on their own after 3 days.  After this you can clean the wound twice daily with water and a gentle soap.  Return to clinic if you develop any swelling, purulent drainage, streaking, warmth or signs of infection.

## 2023-10-15 NOTE — ED Provider Notes (Signed)
[facility]    CSN: [id number] Arrival date & time: 10/15/23  1410      History   Chief Complaint Chief Complaint  Patient presents with   Laceration    HPI Roberto Dickson is a 82 y.o. male.   Patient presents to clinic with daughter.  Daughter is providing Spanish translation, declines an interpreter.  His wife came into the bathroom and noticed there was blood in the shower and his wife thought he cut himself on a plastic piece.  This happened around 3 hours ago.  Daughter is unsure if Tdap was updated, thinks his updated in 2023 when he immigrated to the [place], confirmed with medical records that this is accurate.  Patient is on Coumadin.  Wife wrapped his right ring and pinky finger at home with cotton balls and tape.  Bleeding controlled in clinic. Skin flap to right ring finger.   The history is provided by the patient, a caregiver and medical records.  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/12/2021    Patient Active Problem List   Diagnosis Date Noted   HFrEF (heart failure with reduced ejection fraction) (HCC) 05/03/2023   Bilateral hip pain 01/25/2023   History of seizure 08/24/2022   History of stroke 11/09/2021   Hyperlipidemia 10/21/2021   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   Coronary artery disease of native artery of native heart with stable angina pectoris (HCC)    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/12/2021   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   Sleep disturbance    Essential  hypertension     Past Surgical History:  Procedure Laterality Date   APPENDECTOMY     in his 73's   CORONARY ANGIOGRAPHY N/A 10/20/2021   Procedure: CORONARY ANGIOGRAPHY;  Surgeon: Kathleene Hazel, MD;  Location: [facility];  Service: Cardiovascular;  Laterality: N/A;   IR CT HEAD LTD  07/19/2021   IR FLUORO GUIDED NEEDLE PLC ASPIRATION/INJECTION LOC  07/21/2021   IR PERCUTANEOUS ART THROMBECTOMY/INFUSION INTRACRANIAL INC DIAG ANGIO  07/19/2021   RADIOLOGY WITH ANESTHESIA N/A 07/19/2021   Procedure: IR WITH ANESTHESIA;  Surgeon: Julieanne Cotton, MD;  Location: [facility];  Service: Radiology;  Laterality: N/A;       Home Medications    Prior to Admission medications   Medication Sig Start Date End Date Taking? Authorizing Provider  acetaminophen (TYLENOL) 325 MG tablet Take 1-2 tablets (325-650 mg total) by mouth every 4 (four) hours as needed for mild pain. Patient taking differently: Take 325 mg by mouth daily as needed for mild pain (pain score 1-3). 08/10/21   Love, Evlyn Kanner, PA-C  amLODipine (NORVASC) 5 MG tablet TAKE 1 TABLET(5 MG) BY MOUTH DAILY 02/16/23   [name], MD  aspirin EC (ASPIRIN LOW DOSE) 81 MG tablet Take 1 tablet (81 mg total) by mouth daily. Swallow whole. 10/19/22   Jodelle Red, MD  atorvastatin (LIPITOR) 80 MG tablet TAKE 1 TABLET(80 MG) BY MOUTH DAILY 03/03/23   [name], MD  docusate sodium (COLACE) 100 MG capsule Take 1 capsule (100 mg total) by mouth 2 (two) times daily. Patient taking differently: Take 200 mg by mouth every other day. 08/10/21   Love, Evlyn Kanner, PA-C  furosemide (LASIX) 20 MG tablet Take 1 tablet (20 mg total) by mouth once a week. Ok to take an extra as needed 03/22/23   Alver Sorrow, NP  levETIRAcetam (KEPPRA) 500 MG tablet Take 1 tablet (500 mg total) by mouth 2 (two) times daily. 05/08/23 05/02/24  [name], MD  melatonin 3 MG TABS tablet Take 0.5 tablets (1.5 mg total) by mouth  at bedtime. OVER THE COUNTER Patient taking differently: Take 1.5 mg by mouth at bedtime. 08/10/21   Love, Evlyn Kanner, PA-C  metoprolol succinate (TOPROL-XL) 50 MG 24 hr tablet TAKE 1 TABLET(50 MG) BY MOUTH AT BEDTIME 05/17/23   Jodelle Red, MD  pantoprazole (PROTONIX) 40 MG tablet TAKE 1 TABLET(40 MG) BY MOUTH AT BEDTIME 08/12/23   [name], MD  tamsulosin (FLOMAX) 0.4 MG CAPS capsule TAKE 1 CAPSULE BY MOUTH DAILY 01/18/23   [name], MD  tiZANidine (ZANAFLEX) 2 MG tablet TAKE 1 TABLET BY MOUTH AT BEDTIME AS NEEDED FOR MUSCLE SPASMS 09/30/23   [name], MD  warfarin (COUMADIN) 6 MG tablet TAKE 1 TABLET BY MOUTH DAILY AS DIRECTED BY COAGULATION CLINIC 06/18/23   Jodelle Red, MD    Family History Family History  Problem Relation Age of Onset   Cancer - Prostate Father     Social History Social History   Tobacco Use   Smoking status: Never   Smokeless tobacco: Never  Vaping Use   Vaping status: Never Used  Substance Use Topics   Alcohol use: Not Currently    Alcohol/week: 1.0 standard drink of alcohol    Types: 1 Cans of beer per week   Drug use: Never     Allergies   Patient has no known allergies.   Review of Systems Review of Systems  Per HPI   Physical Exam Triage Vital Signs ED Triage Vitals  Encounter Vitals Group     BP 10/15/23 1452 93/62     Systolic BP Percentile --      Diastolic BP Percentile --      Pulse Rate 10/15/23 1452 62     Resp 10/15/23 1452 16     Temp 10/15/23 1452 98.3 F (36.8 C)     Temp Source 10/15/23 1452 Oral     SpO2 10/15/23 1452 100 %     Weight --      Height --      Head Circumference --      Peak Flow --      Pain Score 10/15/23 1453 0     Pain Loc --      Pain Education --      Exclude from Growth Chart --    No data found.  Updated Vital Signs BP 93/62 (BP Location: Right Arm)   Pulse 62   Temp 98.3 F (36.8 C) (Oral)   Resp 16   SpO2 100%   Visual  Acuity Right Eye Distance:   Left Eye Distance:   Bilateral Distance:    Right Eye Near:   Left Eye Near:    Bilateral Near:     Physical Exam Vitals and nursing note reviewed.  Constitutional:      Appearance: Normal appearance.  HENT:     Head: Normocephalic and atraumatic.  Right Ear: External ear normal.     Left Ear: External ear normal.     Nose: Nose normal.     Mouth/Throat:     Mouth: Mucous membranes are moist.  Eyes:     Conjunctiva/sclera: Conjunctivae normal.  Cardiovascular:     Rate and Rhythm: Normal rate.     Pulses: Normal pulses.  Pulmonary:     Effort: Pulmonary effort is normal. No respiratory distress.  Musculoskeletal:        General: Normal range of motion.  Skin:    General: Skin is warm and dry.  Neurological:     General: No focal deficit present.     Mental Status: He is alert.  Psychiatric:        Mood and Affect: Mood normal.      UC Treatments / Results  Labs (all labs ordered are listed, but only abnormal results are displayed) Labs Reviewed - No data to display  EKG   Radiology No results found.  Procedures Procedures (including critical care time)  Medications Ordered in UC Medications - No data to display  Initial Impression / Assessment and Plan / UC Course  I have reviewed the triage vital signs and the nursing notes.  Pertinent labs & imaging results that were available during my care of the patient were reviewed by me and considered in my medical decision making (see chart for details).  Vitals and triage reviewed, patient is hemodynamically stable.  Bandage removed and wounds soaked in warm water and Hibiclens.  Tdap has been updated in 2023.  Superficial skin flaps to right ring finger and a nonsuturable skin tear to the distal right fifth finger.  No nailbed involvement.  Range of motion intact.  Bleeding controlled.  Area cleaned with wound spray and Betadine applied for Steri-Strips.  Wound care discussed  with daughter.  Will withhold oral antibiotics at this time, educated on signs and symptoms of infection.  Plan of care, follow-up care return precautions given, no questions at this time.     Final Clinical Impressions(s) / UC Diagnoses   Final diagnoses:  Laceration of right ring finger without foreign body without damage to nail, initial encounter  Laceration of right little finger without foreign body without damage to nail, initial encounter     Discharge Instructions      Mantenga la mano elevada y evite usarla para ayudar a reducir el riesgo de sangrado recurrente, ya que est tomando anticoagulantes. Mantenga la zona limpia y seca. Debe esperar 24 horas antes de ducharse y mojar la zona. Despus, puede limpiar suavemente la zona con agua y [place] y secarla con palmaditas. No tire ni tire de las Steri-Strips. La piel y la herida debajo de ellas se estn curando. Las Steri-Strips generalmente se desprenden solas despus de 3 das. Despus de esto, puede limpiar la [employer] al da con agua y un [place]. Regrese a la clnica si presenta hinchazn, secrecin purulenta, vetas, calor o signos de infeccin.  Please keep his hand elevated and avoid using it to help reduce the risk of recurrent bleeding since he is on a blood thinner.  Keep the area clean and dry.  You should wait 24 hours before showering and getting the area wet.  Afterwards you can gently clean the area with mild soap and water and pat dry.  Do not pull or tug at the Steri-Strips.  The skin and and wound is healing underneath of it.  Steri-Strips will usually  come off on their own after 3 days.  After this you can clean the wound twice daily with water and a gentle soap.  Return to clinic if you develop any swelling, purulent drainage, streaking, warmth or signs of infection.      ED Prescriptions   None    PDMP not reviewed this encounter.   Chancey Ringel, [place], [place] 10/15/23 [phone]

## 2023-10-15 NOTE — ED Triage Notes (Signed)
Pt was cleaning shower today and grabbed plastic part in shower that was sharp and cut his right index and pinky finger. Pt is on blood thinners but bleeding has stopped.

## 2023-10-25 ENCOUNTER — Encounter: Payer: Self-pay | Admitting: Emergency Medicine

## 2023-10-25 ENCOUNTER — Ambulatory Visit: Payer: Medicare Other | Admitting: Emergency Medicine

## 2023-10-25 VITALS — BP 128/80 | HR 61 | Temp 98.7°F | Ht 67.0 in | Wt 174.6 lb

## 2023-10-25 DIAGNOSIS — B351 Tinea unguium: Secondary | ICD-10-CM

## 2023-10-25 DIAGNOSIS — I513 Intracardiac thrombosis, not elsewhere classified: Secondary | ICD-10-CM | POA: Diagnosis not present

## 2023-10-25 DIAGNOSIS — Z8673 Personal history of transient ischemic attack (TIA), and cerebral infarction without residual deficits: Secondary | ICD-10-CM

## 2023-10-25 DIAGNOSIS — I5020 Unspecified systolic (congestive) heart failure: Secondary | ICD-10-CM

## 2023-10-25 DIAGNOSIS — I10 Essential (primary) hypertension: Secondary | ICD-10-CM

## 2023-10-25 DIAGNOSIS — H9193 Unspecified hearing loss, bilateral: Secondary | ICD-10-CM | POA: Diagnosis not present

## 2023-10-25 DIAGNOSIS — Z87898 Personal history of other specified conditions: Secondary | ICD-10-CM

## 2023-10-25 DIAGNOSIS — I25118 Atherosclerotic heart disease of native coronary artery with other forms of angina pectoris: Secondary | ICD-10-CM

## 2023-10-25 NOTE — Assessment & Plan Note (Signed)
Echo 01/2022 mildly reduced LVEF 45-50%.  Repeat echo 01/10/2023 LVEF 45-50%, WMA, apical akinesis showing slow flow but no clear thrombus, dilation aortic root 42 mm and ascending aorta 39 mm.  Recommend fluid restriction <2L, low salt diet. Continue metoprolol. BP previously not felt to tolerate ARB/ARNI.  NYHA I.  Given mild lower extremity edema we will transition his Lasix from as needed to once per week with additional dose as needed for edema.

## 2023-10-25 NOTE — Assessment & Plan Note (Signed)
Chronic and affecting quality of life.  Will need hearing aids. Referral to audiology placed today

## 2023-10-25 NOTE — Progress Notes (Signed)
Roberto Dickson 82 y.o.   Chief Complaint  Patient presents with   Follow-up    6 month f/u for HTN. Med refill wanting a 90 day supply on all meds. Patient had another black out episode did not fall and came-to quickly no ambulance was called. Pt c/o of right great toe nail is hurting. Hearing aid not working any more hear is worsen.     HISTORY OF PRESENT ILLNESS: This is a 82 y.o. male accompanied by daughter here for 2-month follow-up of multiple chronic medical conditions. Also has several concerns: 1.  Decreased bilateral hearing.  Needs referral to audiologist 2.  Decreased energy and desire 3.  Pain to right big toe.  Fungal infection.  Needs referral to podiatry  Most recent cardiology evaluation, assessment and plan as follows: ASSESSMENT AND PLAN: .     CAD - Echo 10/2021 with significant 3 vessel CAD not PCI candidate. LVEF preserved. Seen by [facility] and prefers medial management. He is asymptomatic with no chest pain nor dyspnea. His GDMT includes aspirin, metoprolol, atorvastatin. If anginal symptoms develop, could consider Imdur or alternative antianginal agent. Heart healthy diet and regular cardiovascular exercise encouraged.  Update direct LDL today.   HFrEF - Echo 01/2022 mildly reduced LVEF 45-50%.  Repeat echo 01/10/2023 LVEF 45-50%, WMA, apical akinesis showing slow flow but no clear thrombus, dilation aortic root 42 mm and ascending aorta 39 mm.  Recommend fluid restriction <2L, low salt diet. Continue metoprolol. BP previously not felt to tolerate ARB/ARNI.  NYHA I. Euvolemic and well compensated on exam.  Continue Lasix 20 mg once per week.  Could take extra 20 mg as needed for weight gain of 2 pounds overnight or 5 pounds in 1 week.   History of CVA-Continue to follow with neurology.  Continue atorvastatin, aspirin. Mild R hand and RLE defecits.    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 Repeat echo 01/2022 with  resolution of thrombus but still akinetic apex with slow flow recommended for continuation of Warfarin.  Repeat echo 01/2023 with stable slow flow but no thrombus.  Denies hematuria, melena. Follows routinely with coumadin clinic. Update CMP/CBC today.   HTN- Continue Amlodipine.    HLD, LDL goal less than 70-Continue Atorvastatin 80mg  daily.        Dispo: follow up in 6 mos   Signed, Alver Sorrow, NP    HPI   Prior to Admission medications   Medication Sig Start Date End Date Taking? Authorizing Provider  acetaminophen (TYLENOL) 325 MG tablet Take 1-2 tablets (325-650 mg total) by mouth every 4 (four) hours as needed for mild pain. Patient taking differently: Take 325 mg by mouth daily as needed for mild pain (pain score 1-3). 08/10/21  Yes Love, Evlyn Kanner, PA-C  amLODipine (NORVASC) 5 MG tablet TAKE 1 TABLET(5 MG) BY MOUTH DAILY 02/16/23  Yes Valor Quaintance, Eilleen Kempf, MD  aspirin EC (ASPIRIN LOW DOSE) 81 MG tablet Take 1 tablet (81 mg total) by mouth daily. Swallow whole. 10/19/22  Yes Jodelle Red, MD  atorvastatin (LIPITOR) 80 MG tablet TAKE 1 TABLET(80 MG) BY MOUTH DAILY 03/03/23  Yes Jhett Fretwell, Eilleen Kempf, MD  docusate sodium (COLACE) 100 MG capsule Take 1 capsule (100 mg total) by mouth 2 (two) times daily. Patient taking differently: Take 200 mg by mouth every other day. 08/10/21  Yes Love, Evlyn Kanner, PA-C  furosemide (LASIX) 20 MG tablet Take 1 tablet (20 mg total) by mouth once a week. Ok to  take an extra as needed 03/22/23  Yes Alver Sorrow, NP  levETIRAcetam (KEPPRA) 500 MG tablet Take 1 tablet (500 mg total) by mouth 2 (two) times daily. 05/08/23 05/02/24 Yes Daleon Willinger, Eilleen Kempf, MD  melatonin 3 MG TABS tablet Take 0.5 tablets (1.5 mg total) by mouth at bedtime. OVER THE COUNTER Patient taking differently: Take 1.5 mg by mouth at bedtime. 08/10/21  Yes Love, Evlyn Kanner, PA-C  metoprolol succinate (TOPROL-XL) 50 MG 24 hr tablet TAKE 1 TABLET(50 MG) BY MOUTH AT BEDTIME 05/17/23   Yes Jodelle Red, MD  pantoprazole (PROTONIX) 40 MG tablet TAKE 1 TABLET(40 MG) BY MOUTH AT BEDTIME 08/12/23  Yes Shadiamond Koska, Eilleen Kempf, MD  tamsulosin (FLOMAX) 0.4 MG CAPS capsule TAKE 1 CAPSULE BY MOUTH DAILY 01/18/23  Yes Maritssa Haughton, Eilleen Kempf, MD  tiZANidine (ZANAFLEX) 2 MG tablet TAKE 1 TABLET BY MOUTH AT BEDTIME AS NEEDED FOR MUSCLE SPASMS 09/30/23  Yes [name], MD  warfarin (COUMADIN) 6 MG tablet TAKE 1 TABLET BY MOUTH DAILY AS DIRECTED BY COAGULATION CLINIC 06/18/23  Yes Jodelle Red, MD    No Known Allergies  Patient Active Problem List   Diagnosis Date Noted   HFrEF (heart failure with reduced ejection fraction) (HCC) 05/03/2023   Bilateral hip pain 01/25/2023   History of seizure 08/24/2022   History of stroke 11/09/2021   Hyperlipidemia 10/21/2021   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   Coronary artery disease of native artery of native heart with stable angina pectoris (HCC)    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/12/2021   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   Sleep disturbance    Essential hypertension     Past Medical History:  Diagnosis Date   Bilateral hip pain 01/25/2023   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   Coronary artery disease of native artery of native heart with stable angina pectoris (HCC)    HFrEF (heart failure with reduced ejection fraction) (HCC) 05/03/2023   History of seizure 08/24/2022   History of stroke 11/09/2021   Hyperlipidemia 10/21/2021   Hypertension    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   Sleep disturbance    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/12/2021    Past Surgical History:  Procedure Laterality Date   APPENDECTOMY     in his 46's   CORONARY ANGIOGRAPHY N/A 10/20/2021   Procedure: CORONARY ANGIOGRAPHY;  Surgeon: Kathleene Hazel, MD;  Location: [facility];  Service: Cardiovascular;  Laterality: N/A;   IR CT HEAD LTD  07/19/2021   IR FLUORO GUIDED NEEDLE PLC ASPIRATION/INJECTION  LOC  07/21/2021   IR PERCUTANEOUS ART THROMBECTOMY/INFUSION INTRACRANIAL INC DIAG ANGIO  07/19/2021   RADIOLOGY WITH ANESTHESIA N/A 07/19/2021   Procedure: IR WITH ANESTHESIA;  Surgeon: Julieanne Cotton, MD;  Location: [facility] OR;  Service: Radiology;  Laterality: N/A;    Social History   Socioeconomic History   Marital status: Married    Spouse name: Not on file   Number of children: Not on file   Years of education: Not on file   Highest education level: Not on file  Occupational History   Not on file  Tobacco Use   Smoking status: Never   Smokeless tobacco: Never  Vaping Use   Vaping status: Never Used  Substance and Sexual Activity   Alcohol use: Not Currently    Alcohol/week: 1.0 standard drink of alcohol    Types: 1 Cans of beer per week   Drug use: Never   Sexual activity: Not on file  Other Topics  Concern   Not on file  Social History Narrative   Not on file   Social Determinants of Health   Financial Resource Strain: Low Risk  (11/23/2022)   Overall Financial Resource Strain (CARDIA)    Difficulty of Paying Living Expenses: Not hard at all  Food Insecurity: No Food Insecurity (11/23/2022)   Hunger Vital Sign    Worried About Running Out of Food in the Last Year: Never true    Ran Out of Food in the Last Year: Never true  Transportation Needs: No Transportation Needs (11/23/2022)   PRAPARE - Administrator, Civil Service (Medical): No    Lack of Transportation (Non-Medical): No  Physical Activity: Sufficiently Active (11/23/2022)   Exercise Vital Sign    Days of Exercise per Week: 5 days    Minutes of Exercise per Session: 30 min  Stress: No Stress Concern Present (11/23/2022)   [employer] of Occupational Health - Occupational Stress Questionnaire    Feeling of Stress : Not at all  Social Connections: Socially Integrated (11/23/2022)   Social Connection and Isolation Panel [NHANES]    Frequency of Communication with Friends and Family: More  than three times a week    Frequency of Social Gatherings with Friends and Family: More than three times a week    Attends Religious Services: More than 4 times per year    Active Member of [employer] or Organizations: Yes    Attends Engineer, structural: More than 4 times per year    Marital Status: Married  Catering manager Violence: Not At Risk (11/23/2022)   Humiliation, Afraid, Rape, and Kick questionnaire    Fear of Current or Ex-Partner: No    Emotionally Abused: No    Physically Abused: No    Sexually Abused: No    Family History  Problem Relation Age of Onset   Cancer - Prostate Father      Review of Systems  Constitutional: Negative.  Negative for chills and fever.  HENT:  Positive for hearing loss. Negative for congestion and sore throat.   Respiratory: Negative.  Negative for cough and shortness of breath.   Cardiovascular: Negative.  Negative for chest pain and palpitations.  Gastrointestinal:  Negative for abdominal pain, diarrhea, nausea and vomiting.  Genitourinary: Negative.  Negative for dysuria and hematuria.  Skin: Negative.  Negative for rash.  Neurological: Negative.  Negative for dizziness and headaches.  All other systems reviewed and are negative.   Vitals:   10/25/23 1538  BP: 128/80  Pulse: 61  Temp: 98.7 F (37.1 C)  SpO2: 97%    Physical Exam Vitals reviewed.  Constitutional:      Appearance: Normal appearance.  HENT:     Head: Normocephalic.     Right Ear: Tympanic membrane, ear canal and external ear normal.     Left Ear: Tympanic membrane, ear canal and external ear normal.     Mouth/Throat:     Mouth: Mucous membranes are moist.     Pharynx: Oropharynx is clear.  Eyes:     Extraocular Movements: Extraocular movements intact.     Conjunctiva/sclera: Conjunctivae normal.     Pupils: Pupils are equal, round, and reactive to light.  Cardiovascular:     Rate and Rhythm: Normal rate and regular rhythm.     Pulses: Normal pulses.      Heart sounds: Normal heart sounds.  Pulmonary:     Effort: Pulmonary effort is normal.     Breath sounds: Normal  breath sounds.  Abdominal:     General: Bowel sounds are normal.     Palpations: Abdomen is soft.  Musculoskeletal:     Cervical back: No tenderness.     Right lower leg: No edema.     Left lower leg: No edema.  Lymphadenopathy:     Cervical: No cervical adenopathy.  Skin:    General: Skin is warm and dry.     Capillary Refill: Capillary refill takes less than 2 seconds.     Comments: Onychomycosis right big toe Recent laceration on right hand.  Healing well.  No infection.  Neurological:     General: No focal deficit present.     Mental Status: He is alert and oriented to person, place, and time.  Psychiatric:        Mood and Affect: Mood normal.        Behavior: Behavior normal.      ASSESSMENT & PLAN: A total of 46 minutes was spent with the patient and counseling/coordination of care regarding preparing for this visit, review of most recent office visit notes, review of most recent cardiologist office visit note, review of multiple chronic medical conditions under management, review of all medications, review of most recent blood work results, cardiovascular risks associated with hypertension, education on nutrition, prognosis, documentation, and need for follow-up.  Problem List Items Addressed This Visit       Cardiovascular and Mediastinum   Essential hypertension - Primary    Well-controlled hypertension Continue amlodipine 5 mg, metoprolol succinate 50 mg      Left ventricular thrombus    No complications. Continues warfarin with follow-up at the coagulation clinic      Coronary artery disease of native artery of native heart with stable angina pectoris (HCC)    Stable. No recent anginal episodes. Continue daily baby aspirin       HFrEF (heart failure with reduced ejection fraction) (HCC)    Echo 01/2022 mildly reduced LVEF 45-50%.  Repeat  echo 01/10/2023 LVEF 45-50%, WMA, apical akinesis showing slow flow but no clear thrombus, dilation aortic root 42 mm and ascending aorta 39 mm.  Recommend fluid restriction <2L, low salt diet. Continue metoprolol. BP previously not felt to tolerate ARB/ARNI.  NYHA I.  Given mild lower extremity edema we will transition his Lasix from as needed to once per week with additional dose as needed for edema.         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  Will need hearing aids. Referral to audiology placed today      Relevant Orders   Ambulatory referral to Audiology     Musculoskeletal and Integument   Onychomycosis    Causing big toe pain Recommend podiatry evaluation.  Referral placed today.      Relevant Orders   Ambulatory referral to Podiatry     Other   History of stroke    Well-controlled hypertension. 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 Recently started on Keppra 500 mg twice a day.      Patient Instructions  Mantenimiento de la salud despus de los 65 aos de edad Health Maintenance After Age 82 Despus de los 65 aos de edad, corre un riesgo mayor de Film/video editor enfermedades e infecciones a Air cabin crew, como tambin de  sufrir lesiones por cadas. Las cadas son la causa principal de las fracturas de huesos y lesiones en la cabeza de personas mayores de 65 aos de edad. Recibir cuidados preventivos de forma regular puede ayudarlo a mantenerse saludable y en buen [place]. Los cuidados preventivos incluyen realizarse anlisis de forma regular y Forensic psychologist en el estilo de vida segn las recomendaciones del mdico. Converse con el mdico sobre lo siguiente: Las pruebas de deteccin y los anlisis que debe International aid/development worker. Una prueba de deteccin es un estudio que se para Engineer, manufacturing la presencia de  una enfermedad cuando no tiene sntomas. Un plan de dieta y ejercicios adecuado para usted. Qu debo saber sobre las pruebas de deteccin y los anlisis para prevenir cadas? Realizarse pruebas de deteccin y [employer] es la mejor manera de Engineer, manufacturing un problema de salud de forma temprana. El diagnstico y tratamiento tempranos le brindan la mejor oportunidad de Chief Operating Officer las afecciones mdicas que son comunes despus de los 65 aos de edad. Ciertas afecciones y elecciones de estilo de vida pueden hacer que sea ms propenso a sufrir Engineer, site. El mdico puede recomendarle lo siguiente: Controles regulares de la visin. Una visin deficiente y afecciones como las cataratas pueden hacer que sea ms propenso a sufrir Engineer, site. Si [place] lentes, asegrese de obtener una receta actualizada si su visin cambia. Revisin de medicamentos. Revise regularmente con el mdico todos los medicamentos que toma, incluidos los medicamentos de [place]. Consulte al [employer] efectos secundarios que pueden hacer que sea ms propenso a sufrir Engineer, site. Informe al mdico si alguno de los medicamentos que toma lo hace sentir mareado o somnoliento. Controles de fuerza y equilibrio. El mdico puede recomendar ciertos estudios para controlar su fuerza y equilibrio al estar de pie, al caminar o al cambiar de posicin. Examen de los pies. El dolor y Materials engineer en los pies, como tambin no utilizar el calzado [place], pueden hacer que sea ms propenso a sufrir Engineer, site. Pruebas de deteccin, que incluyen las siguientes: Pruebas de deteccin para la osteoporosis. La osteoporosis es una afeccin que hace que los huesos se tornen ms dbiles y se quiebren con ms facilidad. Pruebas de deteccin para la presin arterial. Los cambios en la presin arterial y los medicamentos para Chief Operating Officer la presin arterial pueden hacerlo sentir mareado. Prueba de deteccin de la depresin. Es ms probable que sufra una cada si tiene  miedo a caerse, se siente deprimido o se siente incapaz de Probation officer. Prueba de deteccin de consumo de alcohol. Beber demasiado alcohol puede afectar su equilibrio y puede hacer que sea ms propenso a sufrir Engineer, site. Siga estas indicaciones en su casa: Estilo de vida No beba alcohol si: Su mdico le indica no hacerlo. Si bebe alcohol: Limite la cantidad que bebe a lo siguiente: De 0 a 1 medida por da para las mujeres. De 0 a 2 medidas por da para los hombres. Sepa cunta cantidad de alcohol hay en las bebidas que toma. En los [address], una medida equivale a una botella de cerveza de 12 oz (355 ml), un vaso de vino de 5 oz (148 ml) o un vaso de una bebida alcohlica de alta graduacin de 1 oz (44 ml). No consuma ningn producto que contenga nicotina o tabaco. Estos productos incluyen cigarrillos, tabaco para Theatre manager y aparatos de vapeo, como los Administrator, Civil Service. Si necesita ayuda para dejar de consumir estos productos, consulte al [employer]. Actividad  Siga un programa de ejercicio  regular para mantenerse en forma. Esto lo ayudar a Radio producer equilibrio. Consulte al mdico qu tipos de ejercicios son adecuados para usted. Si necesita un bastn o un andador, selo segn las recomendaciones del mdico. Utilice calzado con buen apoyo y suela antideslizante. Seguridad  Retire los [employer] puedan causar tropiezos tales como alfombras, cables u obstculos. Instale equipos de seguridad, como barras para sostn en los baos y barandas de seguridad en las escaleras. Mantenga las habitaciones y los pasillos bien iluminados. Indicaciones generales Hable con el mdico sobre sus riesgos de sufrir una cada. Infrmele a su mdico si: Se cae. Asegrese de informarle a su mdico acerca de todas las cadas, incluso aquellas que parecen ser [employer]. Se siente mareado, cansado (tiene fatiga) o siente que pierde el equilibrio. Use los medicamentos de venta libre  y los recetados solamente como se lo haya indicado el mdico. Estos incluyen suplementos. Siga una dieta sana y [employer] un peso saludable. Una dieta saludable incluye productos lcteos descremados, carnes bajas en contenido de grasa ([place]), fibra de granos enteros, frijoles y [place] frutas y verduras. Mantngase al da con las vacunas. Realcese los estudios de rutina de la salud, dentales y de Wellsite geologist. Resumen Tener un estilo de vida saludable y recibir cuidados preventivos pueden ayudar a Research scientist (physical sciences) salud y el bienestar despus de los 65 aos de [place]. Realizarse pruebas de deteccin y [employer] es la mejor manera de Engineer, manufacturing un problema de salud de forma temprana y [place]ber [place] a Automotive engineer una cada. El diagnstico y tratamiento tempranos le brindan la mejor oportunidad de Chief Operating Officer las afecciones mdicas ms comunes en las personas mayores de 65 aos de edad. Las cadas son la causa principal de las fracturas de huesos y lesiones en la cabeza de personas mayores de 65 aos de edad. Tome precauciones para evitar una cada en su casa. Trabaje con el mdico para saber qu cambios que puede hacer para mejorar su salud y [place], y para prevenir las cadas. Esta informacin no tiene Theme park manager el consejo del mdico. Asegrese de hacerle al mdico cualquier pregunta que tenga. Document Revised: 04/27/2021 Document Reviewed: 04/27/2021 Elsevier Patient Education  2024 Elsevier Inc.       Edwina Barth, MD [facility]

## 2023-10-25 NOTE — Assessment & Plan Note (Signed)
Causing big toe pain Recommend podiatry evaluation.  Referral placed today.

## 2023-10-25 NOTE — Patient Instructions (Signed)
Mantenimiento de la salud despus de los 65 aos de edad Health Maintenance After Age 80 Despus de los 65 aos de edad, corre un riesgo mayor de padecer ciertas enfermedades e infecciones a largo plazo, como tambin de sufrir lesiones por cadas. Las cadas son la causa principal de las fracturas de huesos y lesiones en la cabeza de personas mayores de 65 aos de edad. Recibir cuidados preventivos de forma regular puede ayudarlo a mantenerse saludable y en buen estado. Los cuidados preventivos incluyen realizarse anlisis de forma regular y realizar cambios en el estilo de vida segn las recomendaciones del mdico. Converse con el mdico sobre lo siguiente: Las pruebas de deteccin y los anlisis que debe realizarse. Una prueba de deteccin es un estudio que se para detectar la presencia de una enfermedad cuando no tiene sntomas. Un plan de dieta y ejercicios adecuado para usted. Qu debo saber sobre las pruebas de deteccin y los anlisis para prevenir cadas? Realizarse pruebas de deteccin y anlisis es la mejor manera de detectar un problema de salud de forma temprana. El diagnstico y tratamiento tempranos le brindan la mejor oportunidad de controlar las afecciones mdicas que son comunes despus de los 65 aos de edad. Ciertas afecciones y elecciones de estilo de vida pueden hacer que sea ms propenso a sufrir una cada. El mdico puede recomendarle lo siguiente: Controles regulares de la visin. Una visin deficiente y afecciones como las cataratas pueden hacer que sea ms propenso a sufrir una cada. Si usa lentes, asegrese de obtener una receta actualizada si su visin cambia. Revisin de medicamentos. Revise regularmente con el mdico todos los medicamentos que toma, incluidos los medicamentos de venta libre. Consulte al mdico sobre los efectos secundarios que pueden hacer que sea ms propenso a sufrir una cada. Informe al mdico si alguno de los medicamentos que toma lo hace sentir mareado o  somnoliento. Controles de fuerza y equilibrio. El mdico puede recomendar ciertos estudios para controlar su fuerza y equilibrio al estar de pie, al caminar o al cambiar de posicin. Examen de los pies. El dolor y el adormecimiento en los pies, como tambin no utilizar el calzado adecuado, pueden hacer que sea ms propenso a sufrir una cada. Pruebas de deteccin, que incluyen las siguientes: Pruebas de deteccin para la osteoporosis. La osteoporosis es una afeccin que hace que los huesos se tornen ms dbiles y se quiebren con ms facilidad. Pruebas de deteccin para la presin arterial. Los cambios en la presin arterial y los medicamentos para controlar la presin arterial pueden hacerlo sentir mareado. Prueba de deteccin de la depresin. Es ms probable que sufra una cada si tiene miedo a caerse, se siente deprimido o se siente incapaz de realizar actividades que sola hacer. Prueba de deteccin de consumo de alcohol. Beber demasiado alcohol puede afectar su equilibrio y puede hacer que sea ms propenso a sufrir una cada. Siga estas indicaciones en su casa: Estilo de vida No beba alcohol si: Su mdico le indica no hacerlo. Si bebe alcohol: Limite la cantidad que bebe a lo siguiente: De 0 a 1 medida por da para las mujeres. De 0 a 2 medidas por da para los hombres. Sepa cunta cantidad de alcohol hay en las bebidas que toma. En los [place], una medida equivale a una botella de cerveza de 12 oz (355 ml), un vaso de vino de 5 oz (148 ml) o un vaso de una bebida alcohlica de alta graduacin de 1 oz (44 ml). No consuma ningn producto que   contenga nicotina o tabaco. Estos productos incluyen cigarrillos, tabaco para mascar y aparatos de vapeo, como los cigarrillos electrnicos. Si necesita ayuda para dejar de consumir estos productos, consulte al mdico. Actividad  Siga un programa de ejercicio regular para mantenerse en forma. Esto lo ayudar a mantener el equilibrio. Consulte al  mdico qu tipos de ejercicios son adecuados para usted. Si necesita un bastn o un andador, selo segn las recomendaciones del mdico. Utilice calzado con buen apoyo y suela antideslizante. Seguridad  Retire los objetos que puedan causar tropiezos tales como alfombras, cables u obstculos. Instale equipos de seguridad, como barras para sostn en los baos y barandas de seguridad en las escaleras. Mantenga las habitaciones y los pasillos bien iluminados. Indicaciones generales Hable con el mdico sobre sus riesgos de sufrir una cada. Infrmele a su mdico si: Se cae. Asegrese de informarle a su mdico acerca de todas las cadas, incluso aquellas que parecen ser menores. Se siente mareado, cansado (tiene fatiga) o siente que pierde el equilibrio. Use los medicamentos de venta libre y los recetados solamente como se lo haya indicado el mdico. Estos incluyen suplementos. Siga una dieta sana y mantenga un peso saludable. Una dieta saludable incluye productos lcteos descremados, carnes bajas en contenido de grasa ([place]), fibra de granos enteros, frijoles y muchas frutas y verduras. Mantngase al da con las vacunas. Realcese los estudios de rutina de la salud, dentales y de la vista. Resumen Tener un estilo de vida saludable y recibir cuidados preventivos pueden ayudar a promover la salud y el bienestar despus de los 65 aos de edad. Realizarse pruebas de deteccin y anlisis es la mejor manera de detectar un problema de salud de forma temprana y ayudarlo a evitar una cada. El diagnstico y tratamiento tempranos le brindan la mejor oportunidad de controlar las afecciones mdicas ms comunes en las personas mayores de 65 aos de edad. Las cadas son la causa principal de las fracturas de huesos y lesiones en la cabeza de personas mayores de 65 aos de edad. Tome precauciones para evitar una cada en su casa. Trabaje con el mdico para saber qu cambios que puede hacer para mejorar su salud y  bienestar, y para prevenir las cadas. Esta informacin no tiene como fin reemplazar el consejo del mdico. Asegrese de hacerle al mdico cualquier pregunta que tenga. Document Revised: 04/27/2021 Document Reviewed: 04/27/2021 Elsevier Patient Education  2024 Elsevier Inc.  

## 2023-10-25 NOTE — Assessment & Plan Note (Signed)
Recent seizure believed to be secondary to multiple cerebral infarcts in the past. Recently started on Keppra 500 mg twice a day.

## 2023-10-25 NOTE — Assessment & Plan Note (Signed)
Well-controlled hypertension. Continues atorvastatin 80 mg daily No history of diabetes

## 2023-10-25 NOTE — Assessment & Plan Note (Signed)
Well-controlled hypertension Continue amlodipine 5 mg, metoprolol succinate 50 mg

## 2023-10-25 NOTE — Assessment & Plan Note (Signed)
Stable.  No recent anginal episodes. Continue daily baby aspirin

## 2023-10-25 NOTE — Assessment & Plan Note (Signed)
No complications. Continues warfarin with follow-up at the coagulation clinic

## 2023-10-26 ENCOUNTER — Other Ambulatory Visit (HOSPITAL_BASED_OUTPATIENT_CLINIC_OR_DEPARTMENT_OTHER): Payer: Self-pay | Admitting: Family

## 2023-10-30 ENCOUNTER — Ambulatory Visit: Payer: Medicare Other | Attending: Internal Medicine | Admitting: *Deleted

## 2023-10-30 DIAGNOSIS — I513 Intracardiac thrombosis, not elsewhere classified: Secondary | ICD-10-CM | POA: Diagnosis not present

## 2023-10-30 DIAGNOSIS — I63512 Cerebral infarction due to unspecified occlusion or stenosis of left middle cerebral artery: Secondary | ICD-10-CM | POA: Diagnosis not present

## 2023-10-30 LAB — POCT INR: INR: 2.9 (ref 2.0–3.0)

## 2023-10-30 NOTE — Patient Instructions (Signed)
Description   Continue taking warfarin 1 tablet daily except 1/2 tablet on Sundays, Tuesdays and Fridays.  Recheck INR in 5 weeks (normally 6 weeks).  [facility] [phone] or [phone]

## 2023-11-07 ENCOUNTER — Other Ambulatory Visit (HOSPITAL_BASED_OUTPATIENT_CLINIC_OR_DEPARTMENT_OTHER): Payer: Self-pay | Admitting: Cardiology

## 2023-11-07 DIAGNOSIS — I513 Intracardiac thrombosis, not elsewhere classified: Secondary | ICD-10-CM

## 2023-11-07 DIAGNOSIS — R931 Abnormal findings on diagnostic imaging of heart and coronary circulation: Secondary | ICD-10-CM

## 2023-11-07 DIAGNOSIS — I10 Essential (primary) hypertension: Secondary | ICD-10-CM

## 2023-11-12 ENCOUNTER — Ambulatory Visit (INDEPENDENT_AMBULATORY_CARE_PROVIDER_SITE_OTHER): Payer: Medicare Other | Admitting: Podiatry

## 2023-11-12 DIAGNOSIS — B351 Tinea unguium: Secondary | ICD-10-CM

## 2023-11-12 DIAGNOSIS — L84 Corns and callosities: Secondary | ICD-10-CM | POA: Diagnosis not present

## 2023-11-12 DIAGNOSIS — M79674 Pain in right toe(s): Secondary | ICD-10-CM | POA: Diagnosis not present

## 2023-11-12 DIAGNOSIS — M79675 Pain in left toe(s): Secondary | ICD-10-CM | POA: Diagnosis not present

## 2023-11-12 DIAGNOSIS — Z7901 Long term (current) use of anticoagulants: Secondary | ICD-10-CM | POA: Diagnosis not present

## 2023-11-12 MED ORDER — CICLOPIROX 8 % EX SOLN: Freq: Every day | CUTANEOUS | 2 refills | Status: DC

## 2023-11-12 NOTE — Progress Notes (Unsigned)
Subjective:   Patient ID: Roberto Dickson, male   DOB: 82 y.o.   MRN: [id number]   HPI Chief Complaint  Patient presents with   Nail Problem    RM#12 Right big toe nail fungus hurts when applies pressure.   82 year old male presents the office with above concerns.  Patient also has thick elongated cannot trim himself in particular the right big toenail is quite thick.  Previously had a left big toenail removed many years ago.  Denies any drainage or swelling or redness.  No recent treatment.  He is currently on warfarin.   Review of Systems  All other systems reviewed and are negative.  Past Medical History:  Diagnosis Date   Bilateral hip pain 01/25/2023   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   Coronary artery disease of native artery of native heart with stable angina pectoris (HCC)    HFrEF (heart failure with reduced ejection fraction) (HCC) 05/03/2023   History of seizure 08/24/2022   History of stroke 11/09/2021   Hyperlipidemia 10/21/2021   Hypertension    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   Sleep disturbance    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/12/2021    Past Surgical History:  Procedure Laterality Date   APPENDECTOMY     in his 75's   CORONARY ANGIOGRAPHY N/A 10/20/2021   Procedure: CORONARY ANGIOGRAPHY;  Surgeon: Kathleene Hazel, MD;  Location: [facility];  Service: Cardiovascular;  Laterality: N/A;   IR CT HEAD LTD  07/19/2021   IR FLUORO GUIDED NEEDLE PLC ASPIRATION/INJECTION LOC  07/21/2021   IR PERCUTANEOUS ART THROMBECTOMY/INFUSION INTRACRANIAL INC DIAG ANGIO  07/19/2021   RADIOLOGY WITH ANESTHESIA N/A 07/19/2021   Procedure: IR WITH ANESTHESIA;  Surgeon: Julieanne Cotton, MD;  Location: [facility];  Service: Radiology;  Laterality: N/A;     Current Outpatient Medications:    acetaminophen (TYLENOL) 325 MG tablet, Take 1-2 tablets (325-650 mg total) by mouth every 4 (four) hours as needed for mild pain. (Patient taking differently: Take  325 mg by mouth daily as needed for mild pain (pain score 1-3).), Disp: , Rfl:    amLODipine (NORVASC) 5 MG tablet, TAKE 1 TABLET(5 MG) BY MOUTH DAILY, Disp: 90 tablet, Rfl: 3   aspirin EC (ASPIRIN LOW DOSE) 81 MG tablet, Take 1 tablet (81 mg total) by mouth daily. Swallow whole., Disp: 90 tablet, Rfl: 3   atorvastatin (LIPITOR) 80 MG tablet, TAKE 1 TABLET(80 MG) BY MOUTH DAILY, Disp: 90 tablet, Rfl: 3   ciclopirox (PENLAC) 8 % solution, Apply topically at bedtime. Apply over nail and surrounding skin. Apply daily over previous coat. After seven (7) days, may remove with alcohol and continue cycle., Disp: 6.6 mL, Rfl: 2   docusate sodium (COLACE) 100 MG capsule, Take 1 capsule (100 mg total) by mouth 2 (two) times daily. (Patient taking differently: Take 200 mg by mouth every other day.), Disp: 60 capsule, Rfl: 0   furosemide (LASIX) 20 MG tablet, TAKE 1 TABLET( 20 MG TOTAL) BY MOUTH ONCE A WEEK., Disp: 15 tablet, Rfl: 0   levETIRAcetam (KEPPRA) 500 MG tablet, Take 1 tablet (500 mg total) by mouth 2 (two) times daily., Disp: 180 tablet, Rfl: 3   melatonin 3 MG TABS tablet, Take 0.5 tablets (1.5 mg total) by mouth at bedtime. OVER THE COUNTER (Patient taking differently: Take 1.5 mg by mouth at bedtime.), Disp: 30 tablet, Rfl: 0   metoprolol succinate (TOPROL-XL) 50 MG 24 hr tablet, TAKE 1 TABLET(50 MG) BY MOUTH AT BEDTIME,  Disp: 90 tablet, Rfl: 3   pantoprazole (PROTONIX) 40 MG tablet, TAKE 1 TABLET(40 MG) BY MOUTH AT BEDTIME, Disp: 90 tablet, Rfl: 1   tamsulosin (FLOMAX) 0.4 MG CAPS capsule, TAKE 1 CAPSULE BY MOUTH DAILY, Disp: 90 capsule, Rfl: 1   tiZANidine (ZANAFLEX) 2 MG tablet, TAKE 1 TABLET BY MOUTH AT BEDTIME AS NEEDED FOR MUSCLE SPASMS, Disp: 30 tablet, Rfl: 1   warfarin (COUMADIN) 6 MG tablet, TAKE 1 TABLET BY MOUTH DAILY AS DIRECTED BY COAGULATION CLINIC, Disp: 100 tablet, Rfl: 1  No Known Allergies         Objective:  Physical Exam  General: AAO x3, NAD- daughter translates    Dermatological: The nails are hypertrophic, dystrophic with yellow, brown discoloration with subungual debris present.  The right hallux toe has no significant.  There is tenderness over the nails and elongated.  He also has a callus submetatarsal on the left foot without any underlying ulceration, drainage or any signs of infection.  There are no open lesions.  Vascular: Dorsalis Pedis artery and Posterior Tibial artery pedal pulses are palpable bilateral with immedate capillary fill time. Pedal hair growth present.  There is no pain with calf compression, swelling, warmth, erythema.   Neruologic: Grossly intact via light touch bilateral.   Musculoskeletal: Tenderness to the toenails.      Assessment:   Symptomatic onychomycosis, hyperkeratotic lesion on warfarin     Plan:  -Treatment options discussed including all alternatives, risks, and complications -Etiology of symptoms were discussed  Symptomatic onychomycosis, on warfarin -We discussed total nail avulsion of the right hallux however after discussion about the procedure as well as postoperative course his daughter wants to hold off on this.  Instructed with the nails x 9 without any complications or bleeding. -Discussed treatment options for nail fungus recommend routine debridement.  They elected proceed with medication as well.  Prescribed Penlac discussed success rates with this.   Hyperkeratotic lesion, on warfarin -Sharply debrided x 1 without any complications or bleeding.    Return in about 3 months (around 02/10/2024) for nail trim/pain.  Vivi Barrack DPM

## 2023-12-04 ENCOUNTER — Ambulatory Visit: Payer: Medicare Other | Admitting: *Deleted

## 2023-12-04 DIAGNOSIS — I513 Intracardiac thrombosis, not elsewhere classified: Secondary | ICD-10-CM | POA: Insufficient documentation

## 2023-12-04 DIAGNOSIS — Z5181 Encounter for therapeutic drug level monitoring: Secondary | ICD-10-CM | POA: Insufficient documentation

## 2023-12-04 DIAGNOSIS — I63512 Cerebral infarction due to unspecified occlusion or stenosis of left middle cerebral artery: Secondary | ICD-10-CM | POA: Insufficient documentation

## 2023-12-04 LAB — POCT INR: INR: 2.8 (ref 2.0–3.0)

## 2023-12-04 NOTE — Patient Instructions (Signed)
 Description   Continue taking warfarin 1 tablet daily except 1/2 tablet on Sundays, Tuesdays and Fridays.  Recheck INR in 6 weeks (normally 6 weeks).  [facility] [phone] or [phone]

## 2023-12-05 ENCOUNTER — Emergency Department (HOSPITAL_COMMUNITY): Payer: Medicare Other

## 2023-12-05 ENCOUNTER — Encounter: Payer: Self-pay | Admitting: Emergency Medicine

## 2023-12-05 ENCOUNTER — Other Ambulatory Visit: Payer: Self-pay

## 2023-12-05 ENCOUNTER — Inpatient Hospital Stay (HOSPITAL_COMMUNITY)
Admission: EM | Admit: 2023-12-05 | Discharge: 2024-01-05 | DRG: 871 | Disposition: E | Payer: Medicare Other | Attending: Internal Medicine | Admitting: Internal Medicine

## 2023-12-05 ENCOUNTER — Encounter (HOSPITAL_COMMUNITY): Payer: Self-pay | Admitting: Family Medicine

## 2023-12-05 ENCOUNTER — Inpatient Hospital Stay (HOSPITAL_COMMUNITY): Payer: Medicare Other

## 2023-12-05 DIAGNOSIS — J80 Acute respiratory distress syndrome: Secondary | ICD-10-CM | POA: Diagnosis not present

## 2023-12-05 DIAGNOSIS — N1832 Chronic kidney disease, stage 3b: Secondary | ICD-10-CM | POA: Diagnosis present

## 2023-12-05 DIAGNOSIS — I130 Hypertensive heart and chronic kidney disease with heart failure and stage 1 through stage 4 chronic kidney disease, or unspecified chronic kidney disease: Secondary | ICD-10-CM | POA: Diagnosis present

## 2023-12-05 DIAGNOSIS — K529 Noninfective gastroenteritis and colitis, unspecified: Secondary | ICD-10-CM | POA: Diagnosis present

## 2023-12-05 DIAGNOSIS — I714 Abdominal aortic aneurysm, without rupture, unspecified: Secondary | ICD-10-CM

## 2023-12-05 DIAGNOSIS — G9341 Metabolic encephalopathy: Secondary | ICD-10-CM | POA: Diagnosis present

## 2023-12-05 DIAGNOSIS — I513 Intracardiac thrombosis, not elsewhere classified: Secondary | ICD-10-CM | POA: Diagnosis present

## 2023-12-05 DIAGNOSIS — I25118 Atherosclerotic heart disease of native coronary artery with other forms of angina pectoris: Secondary | ICD-10-CM

## 2023-12-05 DIAGNOSIS — Z8616 Personal history of COVID-19: Secondary | ICD-10-CM | POA: Diagnosis not present

## 2023-12-05 DIAGNOSIS — Z8042 Family history of malignant neoplasm of prostate: Secondary | ICD-10-CM

## 2023-12-05 DIAGNOSIS — Z1152 Encounter for screening for COVID-19: Secondary | ICD-10-CM

## 2023-12-05 DIAGNOSIS — Z66 Do not resuscitate: Secondary | ICD-10-CM | POA: Diagnosis present

## 2023-12-05 DIAGNOSIS — Z8673 Personal history of transient ischemic attack (TIA), and cerebral infarction without residual deficits: Secondary | ICD-10-CM

## 2023-12-05 DIAGNOSIS — I4949 Other premature depolarization: Secondary | ICD-10-CM | POA: Diagnosis present

## 2023-12-05 DIAGNOSIS — R6520 Severe sepsis without septic shock: Secondary | ICD-10-CM | POA: Diagnosis not present

## 2023-12-05 DIAGNOSIS — R6521 Severe sepsis with septic shock: Secondary | ICD-10-CM | POA: Diagnosis present

## 2023-12-05 DIAGNOSIS — I2510 Atherosclerotic heart disease of native coronary artery without angina pectoris: Secondary | ICD-10-CM | POA: Diagnosis not present

## 2023-12-05 DIAGNOSIS — E876 Hypokalemia: Secondary | ICD-10-CM | POA: Diagnosis present

## 2023-12-05 DIAGNOSIS — G40909 Epilepsy, unspecified, not intractable, without status epilepticus: Secondary | ICD-10-CM | POA: Diagnosis present

## 2023-12-05 DIAGNOSIS — I5042 Chronic combined systolic (congestive) and diastolic (congestive) heart failure: Secondary | ICD-10-CM | POA: Diagnosis not present

## 2023-12-05 DIAGNOSIS — I5020 Unspecified systolic (congestive) heart failure: Secondary | ICD-10-CM | POA: Diagnosis not present

## 2023-12-05 DIAGNOSIS — I5022 Chronic systolic (congestive) heart failure: Secondary | ICD-10-CM

## 2023-12-05 DIAGNOSIS — Z515 Encounter for palliative care: Secondary | ICD-10-CM | POA: Diagnosis not present

## 2023-12-05 DIAGNOSIS — R7989 Other specified abnormal findings of blood chemistry: Secondary | ICD-10-CM

## 2023-12-05 DIAGNOSIS — K810 Acute cholecystitis: Secondary | ICD-10-CM | POA: Diagnosis present

## 2023-12-05 DIAGNOSIS — Z01810 Encounter for preprocedural cardiovascular examination: Secondary | ICD-10-CM | POA: Diagnosis not present

## 2023-12-05 DIAGNOSIS — Z7189 Other specified counseling: Secondary | ICD-10-CM | POA: Diagnosis not present

## 2023-12-05 DIAGNOSIS — Z79899 Other long term (current) drug therapy: Secondary | ICD-10-CM

## 2023-12-05 DIAGNOSIS — N179 Acute kidney failure, unspecified: Secondary | ICD-10-CM | POA: Diagnosis present

## 2023-12-05 DIAGNOSIS — K819 Cholecystitis, unspecified: Principal | ICD-10-CM

## 2023-12-05 DIAGNOSIS — J690 Pneumonitis due to inhalation of food and vomit: Secondary | ICD-10-CM | POA: Diagnosis present

## 2023-12-05 DIAGNOSIS — I4891 Unspecified atrial fibrillation: Secondary | ICD-10-CM | POA: Diagnosis present

## 2023-12-05 DIAGNOSIS — A419 Sepsis, unspecified organism: Secondary | ICD-10-CM | POA: Diagnosis present

## 2023-12-05 DIAGNOSIS — J9601 Acute respiratory failure with hypoxia: Secondary | ICD-10-CM | POA: Diagnosis not present

## 2023-12-05 DIAGNOSIS — Z7982 Long term (current) use of aspirin: Secondary | ICD-10-CM

## 2023-12-05 DIAGNOSIS — E785 Hyperlipidemia, unspecified: Secondary | ICD-10-CM | POA: Diagnosis present

## 2023-12-05 DIAGNOSIS — K4090 Unilateral inguinal hernia, without obstruction or gangrene, not specified as recurrent: Secondary | ICD-10-CM | POA: Diagnosis present

## 2023-12-05 DIAGNOSIS — Z7901 Long term (current) use of anticoagulants: Secondary | ICD-10-CM

## 2023-12-05 DIAGNOSIS — Z789 Other specified health status: Secondary | ICD-10-CM | POA: Diagnosis not present

## 2023-12-05 DIAGNOSIS — R6510 Systemic inflammatory response syndrome (SIRS) of non-infectious origin without acute organ dysfunction: Secondary | ICD-10-CM | POA: Diagnosis not present

## 2023-12-05 LAB — RESP PANEL BY RT-PCR (RSV, FLU A&B, COVID)  RVPGX2
Influenza A by PCR: NEGATIVE
Influenza B by PCR: NEGATIVE
Resp Syncytial Virus by PCR: NEGATIVE
SARS Coronavirus 2 by RT PCR: NEGATIVE

## 2023-12-05 LAB — COMPREHENSIVE METABOLIC PANEL
ALT: 123 U/L — ABNORMAL HIGH (ref 0–44)
AST: 158 U/L — ABNORMAL HIGH (ref 15–41)
Albumin: 3.3 g/dL — ABNORMAL LOW (ref 3.5–5.0)
Alkaline Phosphatase: 82 U/L (ref 38–126)
Anion gap: 14 (ref 5–15)
BUN: 34 mg/dL — ABNORMAL HIGH (ref 8–23)
CO2: 17 mmol/L — ABNORMAL LOW (ref 22–32)
Calcium: 9 mg/dL (ref 8.9–10.3)
Chloride: 108 mmol/L (ref 98–111)
Creatinine, Ser: 2.25 mg/dL — ABNORMAL HIGH (ref 0.61–1.24)
GFR, Estimated: 29 mL/min — ABNORMAL LOW (ref >60)
Glucose, Bld: 99 mg/dL (ref 70–99)
Potassium: 3.9 mmol/L (ref 3.5–5.1)
Sodium: 139 mmol/L (ref 135–145)
Total Bilirubin: 2.9 mg/dL — ABNORMAL HIGH (ref 0.0–1.2)
Total Protein: 6.8 g/dL (ref 6.5–8.1)

## 2023-12-05 LAB — URINALYSIS, ROUTINE W REFLEX MICROSCOPIC
Bilirubin Urine: NEGATIVE
Glucose, UA: NEGATIVE mg/dL
Ketones, ur: NEGATIVE mg/dL
Nitrite: NEGATIVE
Protein, ur: 100 mg/dL — AB
RBC / HPF: >50 RBC/hpf (ref 0–5)
Specific Gravity, Urine: 1.017 (ref 1.005–1.030)
pH: 5 (ref 5.0–8.0)

## 2023-12-05 LAB — PROTIME-INR
INR: 2.4 — ABNORMAL HIGH (ref 0.8–1.2)
Prothrombin Time: 26.3 s — ABNORMAL HIGH (ref 11.4–15.2)

## 2023-12-05 LAB — CBC
HCT: 42.5 % (ref 39.0–52.0)
Hemoglobin: 13.7 g/dL (ref 13.0–17.0)
MCH: 30.9 pg (ref 26.0–34.0)
MCHC: 32.2 g/dL (ref 30.0–36.0)
MCV: 95.9 fL (ref 80.0–100.0)
Platelets: 189 10*3/uL (ref 150–400)
RBC: 4.43 MIL/uL (ref 4.22–5.81)
RDW: 14.6 % (ref 11.5–15.5)
WBC: 22.7 10*3/uL — ABNORMAL HIGH (ref 4.0–10.5)
nRBC: 0 % (ref 0.0–0.2)

## 2023-12-05 LAB — I-STAT CG4 LACTIC ACID, ED: Lactic Acid, Venous: 1.7 mmol/L (ref 0.5–1.9)

## 2023-12-05 LAB — LIPASE, BLOOD: Lipase: 22 U/L (ref 11–51)

## 2023-12-05 MED ORDER — ACETAMINOPHEN 325 MG PO TABS
650.0000 mg | ORAL_TABLET | Freq: Four times a day (QID) | ORAL | Status: DC | PRN
  Administered 2023-12-06: 650 mg via ORAL
  Filled 2023-12-05: qty 2

## 2023-12-05 MED ORDER — ONDANSETRON HCL 4 MG/2ML IJ SOLN: 4.0000 mg | Freq: Four times a day (QID) | INTRAMUSCULAR | Status: DC | PRN

## 2023-12-05 MED ORDER — FENTANYL CITRATE PF 50 MCG/ML IJ SOSY: 12.5000 ug | PREFILLED_SYRINGE | INTRAMUSCULAR | Status: DC | PRN

## 2023-12-05 MED ORDER — ACETAMINOPHEN 500 MG PO TABS
500.0000 mg | ORAL_TABLET | Freq: Once | ORAL | Status: AC
  Administered 2023-12-05: 500 mg via ORAL
  Filled 2023-12-05: qty 1

## 2023-12-05 MED ORDER — PIPERACILLIN-TAZOBACTAM 3.375 G IVPB 30 MIN
3.3750 g | Freq: Once | INTRAVENOUS | Status: AC
  Administered 2023-12-05: 3.375 g via INTRAVENOUS
  Filled 2023-12-05: qty 50

## 2023-12-05 MED ORDER — ONDANSETRON HCL 4 MG PO TABS: 4.0000 mg | ORAL_TABLET | Freq: Four times a day (QID) | ORAL | Status: DC | PRN

## 2023-12-05 MED ORDER — LACTATED RINGERS IV SOLN: INTRAVENOUS | Status: AC

## 2023-12-05 MED ORDER — SODIUM CHLORIDE 0.9% FLUSH
3.0000 mL | Freq: Two times a day (BID) | INTRAVENOUS | Status: DC
  Administered 2023-12-05: 3 mL via INTRAVENOUS

## 2023-12-05 MED ORDER — ACETAMINOPHEN 650 MG RE SUPP: 650.0000 mg | Freq: Four times a day (QID) | RECTAL | Status: DC | PRN

## 2023-12-05 MED ORDER — SODIUM CHLORIDE 0.9 % IV BOLUS
1000.0000 mL | Freq: Once | INTRAVENOUS | Status: AC
  Administered 2023-12-05: 1000 mL via INTRAVENOUS

## 2023-12-05 MED ORDER — PIPERACILLIN-TAZOBACTAM 3.375 G IVPB
3.3750 g | Freq: Three times a day (TID) | INTRAVENOUS | Status: DC
  Administered 2023-12-06 (×2): 3.375 g via INTRAVENOUS
  Filled 2023-12-05 (×2): qty 50

## 2023-12-05 MED ORDER — LEVETIRACETAM 500 MG PO TABS
500.0000 mg | ORAL_TABLET | Freq: Two times a day (BID) | ORAL | Status: DC
  Administered 2023-12-05 – 2023-12-06 (×2): 500 mg via ORAL
  Filled 2023-12-05 (×2): qty 1

## 2023-12-05 NOTE — Progress Notes (Signed)
 Pharmacy Antibiotic Note  Roberto Dickson is a 83 y.o. male admitted on 12/05/2023 with  intra abdominal infxn .  Pharmacy has been consulted for zoysn dosing.  Crcl 25ml/min  Plan: Zosyn  3.375g IV q8h (4 hour infusion). F/u LOT, renal func    Temp (24hrs), Avg:99.5 F (37.5 C), Min:98.1 F (36.7 C), Max:100.9 F (38.3 C)  Recent Labs  Lab 12/05/23 1449 12/05/23 1850  WBC 22.7*  --   CREATININE 2.25*  --   LATICACIDVEN  --  1.7    CrCl cannot be calculated (Unknown ideal weight.).    No Known Allergies  Antimicrobials this admission: Zosyn  1/1/>  Dose adjustments this admission:   Microbiology results: 1/1 RVP 1/1 BCX  Thank you for allowing pharmacy to be a part of this patient's care.  Sharyne Glatter, PharmD, BCCCP Clinical Pharmacist 12/05/2023 7:49 PM

## 2023-12-05 NOTE — Consult Note (Signed)
 CC: dry mouth  Requesting provider: Dr Ula History provided by patient's daughter and patient.  Patient's daughter declined video interpreter.  She stated her father was hard of hearing and she could do a better job translating  HPI: Roberto Dickson is an 83 y.o. male with a past medical history of severe three-vessel CAD, history of LV thrombus on Coumadin , history of CVA, history of seizures was brought in because of progressive weakness, diarrhea.  Family states that late last week around Friday he started having some upper respiratory complaints.  Then on Monday he complained of bloating and abdominal discomfort and the daughter gave him some Mylanta.  He then had several episodes of emesis.  He then started having loose stools.  His oral intake decreased.  He slipped out of bed this morning but did not hit his head or lose consciousness.  Prior abdominal surgery has included an appendectomy.  He is on Coumadin  for history of LV thrombus which is resolved on recent echocardiogram.  No recent antibiotic use.  Daughter reports that the stool was black but she read that Mylanta could cause that.  He has been taking his Coumadin .  He has been drinking liquids.  He was found to have an inflamed gallbladder on CT, elevated LFTs, and AKI.  Past Medical History:  Diagnosis Date   Bilateral hip pain 01/25/2023   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   Coronary artery disease of native artery of native heart with stable angina pectoris (HCC)    HFrEF (heart failure with reduced ejection fraction) (HCC) 05/03/2023   History of seizure 08/24/2022   History of stroke 11/09/2021   Hyperlipidemia 10/21/2021   Hypertension    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   Sleep disturbance    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/12/2021    Past Surgical History:  Procedure Laterality Date   APPENDECTOMY     in his 84's   CORONARY ANGIOGRAPHY N/A 10/20/2021   Procedure: CORONARY ANGIOGRAPHY;  Surgeon:  Verlin Lonni BIRCH, MD;  Location: [facility];  Service: Cardiovascular;  Laterality: N/A;   IR CT HEAD LTD  07/19/2021   IR FLUORO GUIDED NEEDLE PLC ASPIRATION/INJECTION LOC  07/21/2021   IR PERCUTANEOUS ART THROMBECTOMY/INFUSION INTRACRANIAL INC DIAG ANGIO  07/19/2021   RADIOLOGY WITH ANESTHESIA N/A 07/19/2021   Procedure: IR WITH ANESTHESIA;  Surgeon: Dolphus Carrion, MD;  Location: [facility] OR;  Service: Radiology;  Laterality: N/A;    Family History  Problem Relation Age of Onset   Cancer - Prostate Father     Social:  reports that he has never smoked. He has never used smokeless tobacco. He reports that he does not currently use alcohol after a past usage of about 1.0 standard drink of alcohol per week. He reports that he does not use drugs.  Allergies: No Known Allergies  Medications: I have reviewed the patient's current medications.   ROS - all of the below systems have been reviewed with the patient and positives are indicated with bold text General: chills, fever or night sweats Eyes: blurry vision or double vision ENT: epistaxis or sore throat Allergy/Immunology: itchy/watery eyes or nasal congestion Hematologic/Lymphatic: bleeding problems, blood clots or swollen lymph nodes Endocrine: temperature intolerance or unexpected weight changes Breast: new or changing breast lumps or nipple discharge Resp: cough, shortness of breath, or wheezing CV: chest pain or dyspnea on exertion GI: as per HPI GU: dysuria, trouble voiding, or hematuria MSK: joint pain or joint stiffness Neuro: TIA or stroke symptoms  Derm: pruritus and skin lesion changes Psych: anxiety and depression  PE Blood pressure (!) 110/57, pulse (!) 109, temperature (!) 100.9 F (38.3 C), temperature source Oral, resp. rate 17, SpO2 91%. Constitutional: NAD; conversant; no deformities Eyes: Moist conjunctiva; no lid lag; mild icterus; PERRL Neck: Trachea midline; no thyromegaly Lungs: Normal  respiratory effort; no tactile fremitus CV: RRR; no palpable thrills; no pitting edema GI: Abd soft, tender to palpation right upper quadrant, old right lower quadrant paramedian incision; no palpable hepatosplenomegaly MSK: ; no clubbing/cyanosis; no edema, palpable pulses bilateral DP Psychiatric: Appropriate affect; alert Lymphatic: No palpable cervical or axillary lymphadenopathy Skin: No rash, lesions  Results for orders placed or performed during the hospital encounter of 12/05/23 (from the past 48 hours)  Lipase, blood     Status: None   Collection Time: 12/05/23  2:49 PM  Result Value Ref Range   Lipase 22 11 - 51 U/L    Comment: Performed at [facility], [address], [place], [place] [address]  Comprehensive metabolic panel     Status: Abnormal   Collection Time: 12/05/23  2:49 PM  Result Value Ref Range   Sodium 139 135 - 145 mmol/L   Potassium 3.9 3.5 - 5.1 mmol/L   Chloride 108 98 - 111 mmol/L   CO2 17 (L) 22 - 32 mmol/L   Glucose, Bld 99 70 - 99 mg/dL    Comment: Glucose reference range applies only to samples taken after fasting for at least 8 hours.   BUN 34 (H) 8 - 23 mg/dL   Creatinine, Ser 7.74 (H) 0.61 - 1.24 mg/dL   Calcium  9.0 8.9 - 10.3 mg/dL   Total Protein 6.8 6.5 - 8.1 g/dL   Albumin  3.3 (L) 3.5 - 5.0 g/dL   AST 841 (H) 15 - 41 U/L   ALT 123 (H) 0 - 44 U/L   Alkaline Phosphatase 82 38 - 126 U/L   Total Bilirubin 2.9 (H) 0.0 - 1.2 mg/dL   GFR, Estimated 29 (L) >60 mL/min    Comment: (NOTE) Calculated using the CKD-EPI Creatinine Equation (2021)    Anion gap 14 5 - 15    Comment: Performed at [facility], [address], [place], [place] [address]  CBC     Status: Abnormal   Collection Time: 12/05/23  2:49 PM  Result Value Ref Range   WBC 22.7 (H) 4.0 - 10.5 K/uL   RBC 4.43 4.22 - 5.81 MIL/uL   Hemoglobin 13.7 13.0 - 17.0 g/dL   HCT 57.4 60.9 - 47.9 %   MCV 95.9 80.0 - 100.0 fL   MCH 30.9 26.0 - 34.0 pg   MCHC 32.2 30.0 - 36.0  g/dL   RDW 85.3 88.4 - 84.4 %   Platelets 189 150 - 400 K/uL   nRBC 0.0 0.0 - 0.2 %    Comment: Performed at [facility], [address], [place], [place] [address]  Protime-INR     Status: Abnormal   Collection Time: 12/05/23  6:42 PM  Result Value Ref Range   Prothrombin Time 26.3 (H) 11.4 - 15.2 seconds   INR 2.4 (H) 0.8 - 1.2    Comment: (NOTE) INR goal varies based on device and disease states. Performed at [facility], [address] [place], [place] [address]   I-Stat CG4 Lactic Acid     Status: None   Collection Time: 12/05/23  6:50 PM  Result Value Ref Range  Lactic Acid, Venous 1.7 0.5 - 1.9 mmol/L    CT ABDOMEN PELVIS WO CONTRAST Result Date: 12/05/2023 CLINICAL DATA:  Abdominal pain. EXAM: CT ABDOMEN AND PELVIS WITHOUT CONTRAST TECHNIQUE: Multidetector CT imaging of the abdomen and pelvis was performed following the standard protocol without IV contrast. RADIATION DOSE REDUCTION: This exam was performed according to the departmental dose-optimization program which includes automated exposure control, adjustment of the mA and/or kV according to patient size and/or use of iterative reconstruction technique. COMPARISON:  07/21/2021. FINDINGS: Lower chest: Bibasilar consolidation may represent pneumonia or volume loss. Cardiomegaly. Atheromatous calcifications. Hepatobiliary: No focal abnormality identified in the liver without contrast. The gallbladder is distended. There is pericholecystic fluid and fat stranding. These findings suggest cholecystitis which can be assessed further with HIDA scan, if indicated. Pancreas: Unremarkable. No pancreatic ductal dilatation or surrounding inflammatory changes. Spleen: Normal in size without focal abnormality. Adrenals/Urinary Tract: No adrenal lesions. No hydronephrosis or nephrolithiasis. Right kidney 1.5 cm lesion consistent with a cyst with calcification. No ureteral stones. Urinary bladder was nearly empty with wall  thickening which could be due to inflammation or hypertrophy. Stomach/Bowel: Loops of small bowel are dilated in the midabdomen without a discrete transition point to normal caliber bowel distally. This could represent a enteritis. Normal stool and air in the rectosigmoid. Vascular/Lymphatic: Aortic atherosclerosis. No enlarged abdominal or pelvic lymph nodes. Distal abdominal aortic aneurysm measuring 4.1 cm. Follow up with CTA or MRA in 12 months. Reproductive: Prostate is enlarged, 7.0 x 6.3 x 5.7 cm. Other: Left inguinal hernia noted that contains omental fat. No herniated bowel or evidence of incarceration. Musculoskeletal: Thoracolumbosacral degenerative changes. IMPRESSION: 1. Findings suspicious for cholecystitis. 2. Bibasilar consolidation. 3. Enlarged prostate. 4. Dilated small bowel loops without a discrete transition point. This could represent a nonspecific enteritis. 5. Abdominal aortic aneurysm, 4.1 cm. Follow up with CTA or MRA in 12 months. 6. Left inguinal hernia containing omental fat. 7. Aortic atherosclerosis (ICD10-I70.0). Electronically Signed   By: Fonda Field M.D.   On: 12/05/2023 19:24    Imaging: Personally reviewed  Echo 01/2023 Left ventricular ejection fraction, by estimation, is 45 to 50%. The  left ventricle has mildly decreased function. The left ventricle  demonstrates regional wall motion abnormalities (see scoring  diagram/findings for description). There is moderate  asymmetric left ventricular hypertrophy of the basal-septal segment. Left  ventricular diastolic parameters are consistent with Grade I diastolic  dysfunction (impaired relaxation).   2. Apical akinesis with contrast images showing swirling artifact at apex  consistent with slow flow but no clear thrombus seen   Cardiac stress test 09/06/21 Large size, severe severity mid to apical inferior mostly fixed perfusion defect, suggestive of large RCA territory scar with peri-infarct ischemia (SDS 5).  LVEF 29% with inferior and apical akinesis (suspect LVEF is falsely low, recommend echo correlation). This is a high risk study. No prior for comparison.   A/P: ORESTES GEIMAN is an 83 y.o. male with  Cholecystitis (calculous vs acalculous) Elevated LFTs Nausea/vomiting/diarrhea - possible enteritis  H/o LV thrombus on anticoagulation AKI 3vCAD HFrEF H/o CVA Fat containing Left inguinal hernia   Admit TRH Check RUQ u/s to see if gallstones and evaluate CBD Repeat cbc, cmet, coags in AM; trend LFTs Hold anticoagulation IV hydration IV abx  Can have clear liquids-n.p.o. except meds after midnight  Discussed with daughter at bedside  If has gallstones on ultrasound, may need cardiology for risk stratification to help determine management of gallbladder   Data reviewed -  ct, labs 1/1; 09/25/23; PCP note 10/25/2023; echo 01/10/23; cardiac stress test; ed notes  High-level medical decision making  Camellia HERO. Tanda, MD, FACS General, Bariatric, & Minimally Invasive Surgery [facility]

## 2023-12-05 NOTE — ED Triage Notes (Signed)
 Patient with cold symptoms on Saturday and Sunday, vomited on Sunday. Feeling better but weak on Monday and Tuesday and now today having profuse diarrhea.

## 2023-12-05 NOTE — ED Notes (Signed)
 Patient transported to CT

## 2023-12-05 NOTE — ED Notes (Signed)
 1st lac was in normal range 2nd not needed can be discontinued if Dr allows

## 2023-12-05 NOTE — ED Provider Notes (Signed)
 [place] EMERGENCY DEPARTMENT AT [facility] Provider Note   CSN: [id number] Arrival date & time: 12/05/23  1423     History  Chief Complaint  Patient presents with   Diarrhea    Roberto Dickson is a 83 y.o. male.  85 YOM with PMH of CHF, HTN, A. Fib on anticoagulation, and seizures in the past presenting to the emergency department today with cough, nausea, vomiting, and diarrhea.  The patient initially started with upper respiratory complaints last week.  He was doing a little bit better but does still continue to cough.  Over the last few days he has had some episodes of emesis.  Initially was a posttussive episodes.  He has now had a few episodes when he has not been coughing.  The patient has had multiple loose stools including 2 while waiting in the waiting room that he was unable to hold.  The patient was complaining of abdominal pain yesterday but denies any pain currently.   Diarrhea Associated symptoms: chills        Home Medications Prior to Admission medications   Medication Sig Start Date End Date Taking? Authorizing Provider  acetaminophen  (TYLENOL ) 325 MG tablet Take 1-2 tablets (325-650 mg total) by mouth every 4 (four) hours as needed for mild pain. Patient taking differently: Take 325 mg by mouth daily as needed for mild pain (pain score 1-3). 08/10/21   Love, Sharlet RAMAN, PA-C  amLODipine  (NORVASC ) 5 MG tablet TAKE 1 TABLET(5 MG) BY MOUTH DAILY 02/16/23   Purcell Emil Schanz, MD  aspirin  EC (ASPIRIN  LOW DOSE) 81 MG tablet Take 1 tablet (81 mg total) by mouth daily. Swallow whole. 10/19/22   Lonni Slain, MD  atorvastatin  (LIPITOR ) 80 MG tablet TAKE 1 TABLET(80 MG) BY MOUTH DAILY 03/03/23   Purcell Emil Schanz, MD  ciclopirox  (PENLAC ) 8 % solution Apply topically at bedtime. Apply over nail and surrounding skin. Apply daily over previous coat. After seven (7) days, may remove with alcohol and continue cycle. 11/12/23   Gershon Donnice SAUNDERS, DPM   docusate sodium  (COLACE) 100 MG capsule Take 1 capsule (100 mg total) by mouth 2 (two) times daily. Patient taking differently: Take 200 mg by mouth every other day. 08/10/21   Love, Sharlet RAMAN, PA-C  furosemide  (LASIX ) 20 MG tablet TAKE 1 TABLET( 20 MG TOTAL) BY MOUTH ONCE A WEEK. 10/26/23   Walker, Caitlin S, NP  levETIRAcetam  (KEPPRA ) 500 MG tablet Take 1 tablet (500 mg total) by mouth 2 (two) times daily. 05/08/23 05/02/24  Purcell Emil Schanz, MD  melatonin 3 MG TABS tablet Take 0.5 tablets (1.5 mg total) by mouth at bedtime. OVER THE COUNTER Patient taking differently: Take 1.5 mg by mouth at bedtime. 08/10/21   Love, Sharlet RAMAN, PA-C  metoprolol  succinate (TOPROL -XL) 50 MG 24 hr tablet TAKE 1 TABLET(50 MG) BY MOUTH AT BEDTIME 11/07/23   Lonni Slain, MD  pantoprazole  (PROTONIX ) 40 MG tablet TAKE 1 TABLET(40 MG) BY MOUTH AT BEDTIME 08/12/23   Purcell Emil Schanz, MD  tamsulosin  (FLOMAX ) 0.4 MG CAPS capsule TAKE 1 CAPSULE BY MOUTH DAILY 01/18/23   Purcell Emil Schanz, MD  tiZANidine  (ZANAFLEX ) 2 MG tablet TAKE 1 TABLET BY MOUTH AT BEDTIME AS NEEDED FOR MUSCLE SPASMS 09/30/23   Sagardia, Miguel Jose, MD  warfarin (COUMADIN ) 6 MG tablet TAKE 1 TABLET BY MOUTH DAILY AS DIRECTED BY COAGULATION CLINIC 06/18/23   Lonni Slain, MD      Allergies    Patient has no known  allergies.    Review of Systems   Review of Systems  Constitutional:  Positive for chills.  Respiratory:  Positive for cough and shortness of breath.   Gastrointestinal:  Positive for diarrhea.  All other systems reviewed and are negative.   Physical Exam Updated Vital Signs BP (!) 110/57   Pulse (!) 109   Temp (!) 100.9 F (38.3 C) (Oral)   Resp 17   SpO2 91%  Physical Exam Vitals and nursing note reviewed.   Gen: Chronically ill appearing Eyes: PERRL, EOMI HEENT: no oropharyngeal swelling Neck: trachea midline Resp: clear to auscultation bilaterally Card: Tachycardic, no murmurs, rubs, or  gallops Abd: Mildly distended, diffusely tender with maximal tenderness over the right periumbilical region Extremities: no calf tenderness, no edema Vascular: 2+ radial pulses bilaterally, 2+ DP pulses bilaterally Skin: no rashes Psyc: acting appropriately   ED Results / Procedures / Treatments   Labs (all labs ordered are listed, but only abnormal results are displayed) Labs Reviewed  COMPREHENSIVE METABOLIC PANEL - Abnormal; Notable for the following components:      Result Value   CO2 17 (*)    BUN 34 (*)    Creatinine, Ser 2.25 (*)    Albumin  3.3 (*)    AST 158 (*)    ALT 123 (*)    Total Bilirubin 2.9 (*)    GFR, Estimated 29 (*)    All other components within normal limits  CBC - Abnormal; Notable for the following components:   WBC 22.7 (*)    All other components within normal limits  PROTIME-INR - Abnormal; Notable for the following components:   Prothrombin Time 26.3 (*)    INR 2.4 (*)    All other components within normal limits  RESP PANEL BY RT-PCR (RSV, FLU A&B, COVID)  [id number]  CULTURE, BLOOD (ROUTINE X 2)  CULTURE, BLOOD (ROUTINE X 2)  LIPASE, BLOOD  URINALYSIS, ROUTINE W REFLEX MICROSCOPIC  I-STAT CG4 LACTIC ACID, ED  I-STAT CG4 LACTIC ACID, ED    EKG None  Radiology CT ABDOMEN PELVIS WO CONTRAST Result Date: 12/05/2023 CLINICAL DATA:  Abdominal pain. EXAM: CT ABDOMEN AND PELVIS WITHOUT CONTRAST TECHNIQUE: Multidetector CT imaging of the abdomen and pelvis was performed following the standard protocol without IV contrast. RADIATION DOSE REDUCTION: This exam was performed according to the departmental dose-optimization program which includes automated exposure control, adjustment of the mA and/or kV according to patient size and/or use of iterative reconstruction technique. COMPARISON:  07/21/2021. FINDINGS: Lower chest: Bibasilar consolidation may represent pneumonia or volume loss. Cardiomegaly. Atheromatous calcifications. Hepatobiliary: No focal  abnormality identified in the liver without contrast. The gallbladder is distended. There is pericholecystic fluid and fat stranding. These findings suggest cholecystitis which can be assessed further with HIDA scan, if indicated. Pancreas: Unremarkable. No pancreatic ductal dilatation or surrounding inflammatory changes. Spleen: Normal in size without focal abnormality. Adrenals/Urinary Tract: No adrenal lesions. No hydronephrosis or nephrolithiasis. Right kidney 1.5 cm lesion consistent with a cyst with calcification. No ureteral stones. Urinary bladder was nearly empty with wall thickening which could be due to inflammation or hypertrophy. Stomach/Bowel: Loops of small bowel are dilated in the midabdomen without a discrete transition point to normal caliber bowel distally. This could represent a enteritis. Normal stool and air in the rectosigmoid. Vascular/Lymphatic: Aortic atherosclerosis. No enlarged abdominal or pelvic lymph nodes. Distal abdominal aortic aneurysm measuring 4.1 cm. Follow up with CTA or MRA in 12 months. Reproductive: Prostate is enlarged, 7.0 x 6.3 x 5.7 cm.  Other: Left inguinal hernia noted that contains omental fat. No herniated bowel or evidence of incarceration. Musculoskeletal: Thoracolumbosacral degenerative changes. IMPRESSION: 1. Findings suspicious for cholecystitis. 2. Bibasilar consolidation. 3. Enlarged prostate. 4. Dilated small bowel loops without a discrete transition point. This could represent a nonspecific enteritis. 5. Abdominal aortic aneurysm, 4.1 cm. Follow up with CTA or MRA in 12 months. 6. Left inguinal hernia containing omental fat. 7. Aortic atherosclerosis (ICD10-I70.0). Electronically Signed   By: Fonda Field M.D.   On: 12/05/2023 19:24    Procedures Procedures    Medications Ordered in ED Medications  piperacillin -tazobactam (ZOSYN ) IVPB 3.375 g (has no administration in time range)  sodium chloride  0.9 % bolus 1,000 mL (1,000 mLs Intravenous New  Bag/Given 12/05/23 1737)    ED Course/ Medical Decision Making/ A&P                                 Medical Decision Making 83 year old male with past medical history of coronary artery disease, hypertension, and atrial fibrillation on Coumadin  presenting to the emergency department today with viral symptoms and abdominal pain.  I will further evaluate patient here with basic labs.  Also obtain a lactic acid and blood cultures to screen for sepsis although this may be due to dehydration.  The patient is tachycardic here on arrival.  Will hold off on empiric antibiotics at this time.  Will obtain a COVID and flu swab on the patient in addition to a chest x-ray to evaluate for pneumonia.  Given his age will obtain a CT scan is ordered evaluate for underlying infectious etiologies.  The patient did have a significant leukocytosis.  He did become febrile.  The patient is given Zosyn .  CT scan does show findings concerning for cholecystitis.  I did call and discussed this with Dr. Tanda.  He recommends right upper quadrant ultrasound and admission to medicine.  They will consult.  Recommends holding his anticoagulation.  A call was placed to hospitalist service for admission.  Amount and/or Complexity of Data Reviewed Labs: ordered. Radiology: ordered.  Risk Decision regarding hospitalization.           Final Clinical Impression(s) / ED Diagnoses Final diagnoses:  Cholecystitis  Sepsis, due to unspecified organism, unspecified whether acute organ dysfunction present [facility])    Rx / DC Orders ED Discharge Orders     None         Ula Prentice SAUNDERS, MD 12/05/23 1950

## 2023-12-05 NOTE — H&P (Signed)
 History and Physical    Roberto Dickson FMW:[id number] DOB: August 23, 1941 DOA: 12/05/2023  PCP: Purcell Emil Schanz, MD   Patient coming from: Home   Chief Complaint: N/V/D, fever, lethargy, cough   HPI: Roberto Dickson is a 83 y.o. 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  Patient had been exposed to a grandson with upper respiratory symptoms and the patient himself developed a mild cough and general malaise on 12/01/2023.  By 12/03/2023, patient was feeling much better but then had acute onset postprandial nausea and vomiting.  He complained of some abdominal pain at that time but none since.  He has since gone on to develop profuse diarrhea, fevers, and lethargy.  He is no longer vomiting.  ED Course: Upon arrival to the ED, patient is found to be febrile to 38.3 C and saturating low 90s on room air with normal RR, slightly elevated heart rate, and systolic blood pressure of 95 and greater.  Labs are most notable for creatinine 2.25, AST 158, ALT 123, total bilirubin 2.9, WBC 22,700, normal lipase, normal lactic acid, and INR 2.4.  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  Surgery was consulted by the ED physician and the patient was given a liter of NS and Zosyn .  Review of Systems:  All other systems reviewed and apart from HPI, are negative.  Past Medical History:  Diagnosis Date   Bilateral hip pain 01/25/2023   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   Coronary artery disease of native artery of native heart with stable angina pectoris (HCC)    HFrEF (heart failure with reduced ejection fraction) (HCC) 05/03/2023   History of seizure 08/24/2022   History of stroke 11/09/2021   Hyperlipidemia  10/21/2021   Hypertension    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   Sleep disturbance    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/12/2021    Past Surgical History:  Procedure Laterality Date   APPENDECTOMY     in his 58's   CORONARY ANGIOGRAPHY N/A 10/20/2021   Procedure: CORONARY ANGIOGRAPHY;  Surgeon: Verlin Lonni BIRCH, MD;  Location: [facility] INVASIVE CV LAB;  Service: Cardiovascular;  Laterality: N/A;   IR CT HEAD LTD  07/19/2021   IR FLUORO GUIDED NEEDLE PLC ASPIRATION/INJECTION LOC  07/21/2021   IR PERCUTANEOUS ART THROMBECTOMY/INFUSION INTRACRANIAL INC DIAG ANGIO  07/19/2021   RADIOLOGY WITH ANESTHESIA N/A 07/19/2021   Procedure: IR WITH ANESTHESIA;  Surgeon: Dolphus Carrion, MD;  Location: [facility] OR;  Service: Radiology;  Laterality: N/A;    Social History:   reports that he has never smoked. He has never used smokeless tobacco. He reports that he does not currently use alcohol after a past usage of about 1.0 standard drink of alcohol per week. He reports that he does not use drugs.  No Known Allergies  Family History  Problem Relation Age of Onset   Cancer - Prostate Father      Prior to Admission medications   Medication Sig Start Date End Date Taking? Authorizing Provider  acetaminophen  (TYLENOL ) 325 MG tablet Take 1-2 tablets (325-650 mg total) by mouth every 4 (four) hours as needed for mild pain. Patient taking differently: Take 325 mg by mouth daily as needed for mild pain (pain score 1-3). 08/10/21   Love, Sharlet RAMAN, PA-C  amLODipine  (NORVASC ) 5  MG tablet TAKE 1 TABLET(5 MG) BY MOUTH DAILY 02/16/23   Purcell Emil Schanz, MD  aspirin  EC (ASPIRIN  LOW DOSE) 81 MG tablet Take 1 tablet (81 mg total) by mouth daily. Swallow whole. 10/19/22   Lonni Slain, MD  atorvastatin  (LIPITOR ) 80 MG tablet TAKE 1 TABLET(80 MG) BY MOUTH DAILY 03/03/23   Purcell Emil Schanz, MD  ciclopirox  (PENLAC ) 8 % solution Apply topically at bedtime. Apply over nail and surrounding skin. Apply  daily over previous coat. After seven (7) days, may remove with alcohol and continue cycle. 11/12/23   Gershon Donnice SAUNDERS, DPM  docusate sodium  (COLACE) 100 MG capsule Take 1 capsule (100 mg total) by mouth 2 (two) times daily. Patient taking differently: Take 200 mg by mouth every other day. 08/10/21   Love, Sharlet RAMAN, PA-C  furosemide  (LASIX ) 20 MG tablet TAKE 1 TABLET( 20 MG TOTAL) BY MOUTH ONCE A WEEK. 10/26/23   Walker, Caitlin S, NP  levETIRAcetam  (KEPPRA ) 500 MG tablet Take 1 tablet (500 mg total) by mouth 2 (two) times daily. 05/08/23 05/02/24  Purcell Emil Schanz, MD  melatonin 3 MG TABS tablet Take 0.5 tablets (1.5 mg total) by mouth at bedtime. OVER THE COUNTER Patient taking differently: Take 1.5 mg by mouth at bedtime. 08/10/21   Love, Sharlet RAMAN, PA-C  metoprolol  succinate (TOPROL -XL) 50 MG 24 hr tablet TAKE 1 TABLET(50 MG) BY MOUTH AT BEDTIME 11/07/23   Lonni Slain, MD  pantoprazole  (PROTONIX ) 40 MG tablet TAKE 1 TABLET(40 MG) BY MOUTH AT BEDTIME 08/12/23   Purcell Emil Schanz, MD  tamsulosin  (FLOMAX ) 0.4 MG CAPS capsule TAKE 1 CAPSULE BY MOUTH DAILY 01/18/23   Purcell Emil Schanz, MD  tiZANidine  (ZANAFLEX ) 2 MG tablet TAKE 1 TABLET BY MOUTH AT BEDTIME AS NEEDED FOR MUSCLE SPASMS 09/30/23   Purcell Emil Schanz, MD  warfarin (COUMADIN ) 6 MG tablet TAKE 1 TABLET BY MOUTH DAILY AS DIRECTED BY COAGULATION CLINIC 06/18/23   Lonni Slain, MD    Physical Exam: Vitals:   12/05/23 1841 12/05/23 1930 12/05/23 2000 12/05/23 2024  BP:  104/70 112/64   Pulse:  (!) 105 (!) 103   Resp:  18    Temp: (!) 100.9 F (38.3 C)   (!) 100.8 F (38.2 C)  TempSrc: Oral   Oral  SpO2:  93% 92%      Constitutional: NAD, no pallor or diaphoresis   Eyes: PERTLA, lids and conjunctivae normal ENMT: Mucous membranes are moist. Posterior pharynx clear of any exudate or lesions.   Neck: supple, no masses  Respiratory: no wheezing, no crackles. No accessory muscle use.  Cardiovascular: S1 &  S2 heard, regular rate and rhythm. No extremity edema.   Abdomen: Soft, tender in RUQ. Bowel sounds active.  Musculoskeletal: no clubbing / cyanosis. No joint deformity upper and lower extremities.   Skin: no significant rashes, lesions, ulcers. Warm, dry, well-perfused. Neurologic: CN 2-12 grossly intact aside from gross hearing deficit. Moving all extremities. Alert and oriented.  Psychiatric: Calm. Cooperative.    Labs and Imaging on Admission: I have personally reviewed following labs and imaging studies  CBC: Recent Labs  Lab 12/05/23 1449  WBC 22.7*  HGB 13.7  HCT 42.5  MCV 95.9  PLT 189   Basic Metabolic Panel: Recent Labs  Lab 12/05/23 1449  NA 139  K 3.9  CL 108  CO2 17*  GLUCOSE 99  BUN 34*  CREATININE 2.25*  CALCIUM  9.0   GFR: CrCl cannot be calculated (Unknown ideal weight.). Liver Function  Tests: Recent Labs  Lab 12/05/23 1449  AST 158*  ALT 123*  ALKPHOS 82  BILITOT 2.9*  PROT 6.8  ALBUMIN  3.3*   Recent Labs  Lab 12/05/23 1449  LIPASE 22   No results for input(s): AMMONIA in the last 168 hours. Coagulation Profile: Recent Labs  Lab 12/04/23 1555 12/05/23 1842  INR 2.8 2.4*   Cardiac Enzymes: No results for input(s): CKTOTAL, CKMB, CKMBINDEX, TROPONINI in the last 168 hours. BNP (last 3 results) No results for input(s): PROBNP in the last 8760 hours. HbA1C: No results for input(s): HGBA1C in the last 72 hours. CBG: No results for input(s): GLUCAP in the last 168 hours. Lipid Profile: No results for input(s): CHOL, HDL, LDLCALC, TRIG, CHOLHDL, LDLDIRECT in the last 72 hours. Thyroid Function Tests: No results for input(s): TSH, T4TOTAL, FREET4, T3FREE, THYROIDAB in the last 72 hours. Anemia Panel: No results for input(s): VITAMINB12, FOLATE, FERRITIN, TIBC, IRON, RETICCTPCT in the last 72 hours. Urine analysis:    Component Value Date/Time   COLORURINE YELLOW 01/09/2023 1400    APPEARANCEUR CLEAR 01/09/2023 1400   LABSPEC 1.013 01/09/2023 1400   PHURINE 5.0 01/09/2023 1400   GLUCOSEU NEGATIVE 01/09/2023 1400   HGBUR SMALL (A) 01/09/2023 1400   BILIRUBINUR NEGATIVE 01/09/2023 1400   KETONESUR NEGATIVE 01/09/2023 1400   PROTEINUR 30 (A) 01/09/2023 1400   NITRITE NEGATIVE 01/09/2023 1400   LEUKOCYTESUR NEGATIVE 01/09/2023 1400   Sepsis Labs: @LABRCNTIP (procalcitonin:4,lacticidven:4) ) Recent Results (from the past 240 hours)  Resp panel by RT-PCR (RSV, Flu A&B, Covid) Anterior Nasal Swab     Status: None   Collection Time: 12/05/23  6:41 PM   Specimen: Anterior Nasal Swab  Result Value Ref Range Status   SARS Coronavirus 2 by RT PCR NEGATIVE NEGATIVE Final   Influenza A by PCR NEGATIVE NEGATIVE Final   Influenza B by PCR NEGATIVE NEGATIVE Final    Comment: (NOTE) The Xpert Xpress SARS-CoV-2/FLU/RSV plus assay is intended as an aid in the diagnosis of influenza from Nasopharyngeal swab specimens and should not be used as a sole basis for treatment. Nasal washings and aspirates are unacceptable for Xpert Xpress SARS-CoV-2/FLU/RSV testing.  Fact Sheet for Patients: bloggercourse.com  Fact Sheet for Healthcare Providers: seriousbroker.it  This test is not yet approved or cleared by the [place]  FDA and has been authorized for detection and/or diagnosis of SARS-CoV-2 by FDA under an Emergency Use Authorization (EUA). This EUA will remain in effect (meaning this test can be used) for the duration of the COVID-19 declaration under Section 564(b)(1) of the Act, 21 U.S.C. section 360bbb-3(b)(1), unless the authorization is terminated or revoked.     Resp Syncytial Virus by PCR NEGATIVE NEGATIVE Final    Comment: (NOTE) Fact Sheet for Patients: bloggercourse.com  Fact Sheet for Healthcare Providers: seriousbroker.it  This test is not yet approved or  cleared by the [place]  FDA and has been authorized for detection and/or diagnosis of SARS-CoV-2 by FDA under an Emergency Use Authorization (EUA). This EUA will remain in effect (meaning this test can be used) for the duration of the COVID-19 declaration under Section 564(b)(1) of the Act, 21 U.S.C. section 360bbb-3(b)(1), unless the authorization is terminated or revoked.  Performed at [facility], [address], [place], [place] [address]      Radiological Exams on Admission: CT ABDOMEN PELVIS WO CONTRAST Result Date: 12/05/2023 CLINICAL DATA:  Abdominal pain. EXAM: CT ABDOMEN AND PELVIS WITHOUT CONTRAST TECHNIQUE: Multidetector CT imaging of the abdomen and  pelvis was performed following the standard protocol without IV contrast. RADIATION DOSE REDUCTION: This exam was performed according to the departmental dose-optimization program which includes automated exposure control, adjustment of the mA and/or kV according to patient size and/or use of iterative reconstruction technique. COMPARISON:  07/21/2021. FINDINGS: Lower chest: Bibasilar consolidation may represent pneumonia or volume loss. Cardiomegaly. Atheromatous calcifications. Hepatobiliary: No focal abnormality identified in the liver without contrast. The gallbladder is distended. There is pericholecystic fluid and fat stranding. These findings suggest cholecystitis which can be assessed further with HIDA scan, if indicated. Pancreas: Unremarkable. No pancreatic ductal dilatation or surrounding inflammatory changes. Spleen: Normal in size without focal abnormality. Adrenals/Urinary Tract: No adrenal lesions. No hydronephrosis or nephrolithiasis. Right kidney 1.5 cm lesion consistent with a cyst with calcification. No ureteral stones. Urinary bladder was nearly empty with wall thickening which could be due to inflammation or hypertrophy. Stomach/Bowel: Loops of small bowel are dilated in the midabdomen without a discrete  transition point to normal caliber bowel distally. This could represent a enteritis. Normal stool and air in the rectosigmoid. Vascular/Lymphatic: Aortic atherosclerosis. No enlarged abdominal or pelvic lymph nodes. Distal abdominal aortic aneurysm measuring 4.1 cm. Follow up with CTA or MRA in 12 months. Reproductive: Prostate is enlarged, 7.0 x 6.3 x 5.7 cm. Other: Left inguinal hernia noted that contains omental fat. No herniated bowel or evidence of incarceration. Musculoskeletal: Thoracolumbosacral degenerative changes. IMPRESSION: 1. Findings suspicious for cholecystitis. 2. Bibasilar consolidation. 3. Enlarged prostate. 4. Dilated small bowel loops without a discrete transition point. This could represent a nonspecific enteritis. 5. Abdominal aortic aneurysm, 4.1 cm. Follow up with CTA or MRA in 12 months. 6. Left inguinal hernia containing omental fat. 7. Aortic atherosclerosis (ICD10-I70.0). Electronically Signed   By: Fonda Field M.D.   On: 12/05/2023 19:24    Assessment/Plan   1. Acute cholecystitis  - Appreciate surgery consultation  - Culture blood, check RUQ US , hold warfarin and ASA, continue Zosyn , bowel rest, IVF hydration, trend labs    2. Acute gastroenteritis  - Hx suggestive of viral syndrome in addition to cholecystitis  - Check respiratory virus panel and GI pathogen panel, hold diuretic, start gentle IVF hydration, monitor electrolytes and renal function   3. AKI superimposed on CKD 3B  - Prerenal in setting of N/V/D, no hydronephrosis on CT in ED  - Hold diuretic, continue IVF hydration, renally-dose medications, repeat chem panel in am    4. Chronic HFmrEF  - Appears compensated  - Hold Lasix  and start gentle IVF hydration in light of N/V/D with AKI, monitor daily weight and I/Os    5. LV thrombus  - Hx of LV thrombus; thrombus not seen on most recent echo but has akinetic apex and has been continued on warfarin  - Hold warfarin, follow INR, consider IV heparin   when INR <2    6. CAD  - Triple-vessel CAD with no PCI targets, seen by CTS but elected for medical therapy  - No anginal symptoms currently   7. Hx of CVA  - Hold ASA for possible surgery, hold Lipitor  initially while trending LFTs   8. AAA  - 4.1 cm AAA noted incidentally on CT in ED  - Outpatient follow-up recommended    DVT prophylaxis: SCDs; warfarin pta, INR 2.4  Code Status: Full  Level of Care: Level of care: Progressive Family Communication: Daughter at bedside  Disposition Plan:  Patient is from: home Anticipated d/c is to: TBD Anticipated d/c date is: 03-Jan-2024  Patient  currently: pending RUQ US , repeat labs Consults called: surgery  Admission status: inpatient     Evalene GORMAN Sprinkles, MD [facility]  12/05/2023, 8:47 PM

## 2023-12-06 ENCOUNTER — Other Ambulatory Visit: Payer: Self-pay

## 2023-12-06 ENCOUNTER — Inpatient Hospital Stay (HOSPITAL_COMMUNITY): Payer: Medicare Other

## 2023-12-06 DIAGNOSIS — I2510 Atherosclerotic heart disease of native coronary artery without angina pectoris: Secondary | ICD-10-CM

## 2023-12-06 DIAGNOSIS — Z01810 Encounter for preprocedural cardiovascular examination: Secondary | ICD-10-CM

## 2023-12-06 DIAGNOSIS — I5020 Unspecified systolic (congestive) heart failure: Secondary | ICD-10-CM

## 2023-12-06 DIAGNOSIS — I5042 Chronic combined systolic (congestive) and diastolic (congestive) heart failure: Secondary | ICD-10-CM | POA: Diagnosis not present

## 2023-12-06 DIAGNOSIS — A419 Sepsis, unspecified organism: Principal | ICD-10-CM

## 2023-12-06 DIAGNOSIS — E876 Hypokalemia: Secondary | ICD-10-CM | POA: Insufficient documentation

## 2023-12-06 DIAGNOSIS — I5022 Chronic systolic (congestive) heart failure: Secondary | ICD-10-CM | POA: Diagnosis not present

## 2023-12-06 DIAGNOSIS — R6520 Severe sepsis without septic shock: Secondary | ICD-10-CM

## 2023-12-06 DIAGNOSIS — K810 Acute cholecystitis: Secondary | ICD-10-CM | POA: Diagnosis not present

## 2023-12-06 DIAGNOSIS — J9601 Acute respiratory failure with hypoxia: Secondary | ICD-10-CM

## 2023-12-06 DIAGNOSIS — R6510 Systemic inflammatory response syndrome (SIRS) of non-infectious origin without acute organ dysfunction: Secondary | ICD-10-CM

## 2023-12-06 LAB — BLOOD GAS, ARTERIAL
Acid-base deficit: 3.8 mmol/L — ABNORMAL HIGH (ref 0.0–2.0)
Bicarbonate: 19 mmol/L — ABNORMAL LOW (ref 20.0–28.0)
O2 Saturation: 91.8 %
Patient temperature: 37
pCO2 arterial: 28 mm[Hg] — ABNORMAL LOW (ref 32–48)
pH, Arterial: 7.44 (ref 7.35–7.45)
pO2, Arterial: 57 mm[Hg] — ABNORMAL LOW (ref 83–108)

## 2023-12-06 LAB — CBC
HCT: 38.6 % — ABNORMAL LOW (ref 39.0–52.0)
Hemoglobin: 12.8 g/dL — ABNORMAL LOW (ref 13.0–17.0)
MCH: 31.3 pg (ref 26.0–34.0)
MCHC: 33.2 g/dL (ref 30.0–36.0)
MCV: 94.4 fL (ref 80.0–100.0)
Platelets: 147 10*3/uL — ABNORMAL LOW (ref 150–400)
RBC: 4.09 MIL/uL — ABNORMAL LOW (ref 4.22–5.81)
RDW: 14.6 % (ref 11.5–15.5)
WBC: 18.3 10*3/uL — ABNORMAL HIGH (ref 4.0–10.5)
nRBC: 0 % (ref 0.0–0.2)

## 2023-12-06 LAB — COMPREHENSIVE METABOLIC PANEL
ALT: 96 U/L — ABNORMAL HIGH (ref 0–44)
AST: 193 U/L — ABNORMAL HIGH (ref 15–41)
Albumin: 2.9 g/dL — ABNORMAL LOW (ref 3.5–5.0)
Alkaline Phosphatase: 74 U/L (ref 38–126)
Anion gap: 10 (ref 5–15)
BUN: 44 mg/dL — ABNORMAL HIGH (ref 8–23)
CO2: 19 mmol/L — ABNORMAL LOW (ref 22–32)
Calcium: 8.3 mg/dL — ABNORMAL LOW (ref 8.9–10.3)
Chloride: 107 mmol/L (ref 98–111)
Creatinine, Ser: 2.94 mg/dL — ABNORMAL HIGH (ref 0.61–1.24)
GFR, Estimated: 21 mL/min — ABNORMAL LOW (ref >60)
Glucose, Bld: 88 mg/dL (ref 70–99)
Potassium: 3.4 mmol/L — ABNORMAL LOW (ref 3.5–5.1)
Sodium: 136 mmol/L (ref 135–145)
Total Bilirubin: 2 mg/dL — ABNORMAL HIGH (ref 0.0–1.2)
Total Protein: 5.7 g/dL — ABNORMAL LOW (ref 6.5–8.1)

## 2023-12-06 LAB — PROTIME-INR
INR: 2.3 — ABNORMAL HIGH (ref 0.8–1.2)
Prothrombin Time: 25.4 s — ABNORMAL HIGH (ref 11.4–15.2)

## 2023-12-06 MED ORDER — MORPHINE BOLUS VIA INFUSION: 2.0000 mg | INTRAVENOUS | Status: DC | PRN

## 2023-12-06 MED ORDER — METOPROLOL SUCCINATE ER 50 MG PO TB24
50.0000 mg | ORAL_TABLET | Freq: Every day | ORAL | Status: DC
  Administered 2023-12-06: 50 mg via ORAL
  Filled 2023-12-06: qty 1

## 2023-12-06 MED ORDER — LORAZEPAM 2 MG/ML IJ SOLN
1.0000 mg | INTRAMUSCULAR | Status: DC
  Administered 2023-12-06: 1 mg via INTRAVENOUS
  Filled 2023-12-06: qty 1

## 2023-12-06 MED ORDER — HALOPERIDOL LACTATE 5 MG/ML IJ SOLN
1.0000 mg | INTRAMUSCULAR | Status: DC | PRN
  Administered 2023-12-06: 2 mg via INTRAVENOUS
  Filled 2023-12-06: qty 1

## 2023-12-06 MED ORDER — PIPERACILLIN-TAZOBACTAM 3.375 G IVPB: 3.3750 g | Freq: Two times a day (BID) | INTRAVENOUS | Status: DC

## 2023-12-06 MED ORDER — MORPHINE 100MG IN NS 100ML (1MG/ML) PREMIX INFUSION
5.0000 mg/h | INTRAVENOUS | Status: DC
  Administered 2023-12-06 – 2023-12-08 (×3): 5 mg/h via INTRAVENOUS
  Administered 2023-12-09: 8 mg/h via INTRAVENOUS
  Administered 2023-12-09: 10 mg/h via INTRAVENOUS
  Filled 2023-12-06 (×6): qty 100

## 2023-12-06 MED ORDER — POTASSIUM CHLORIDE CRYS ER 20 MEQ PO TBCR
20.0000 meq | EXTENDED_RELEASE_TABLET | Freq: Once | ORAL | Status: AC
  Administered 2023-12-06: 20 meq via ORAL
  Filled 2023-12-06: qty 1

## 2023-12-06 NOTE — ED Notes (Signed)
 Per lab stool sample too small to perform testing, need recollecting.

## 2023-12-06 NOTE — Subjective & Objective (Signed)
 Pt seen and examined. Used video interpretor [id number] No pain or SOB RN notes pt now on 10 L/min high flow O2 with sats in the high 80s. No chest pain Cards and general surgery reviewed. Pt lives at home with his wife and dtr.

## 2023-12-06 NOTE — Progress Notes (Signed)
 PHARMACY NOTE:  ANTIMICROBIAL RENAL DOSAGE ADJUSTMENT  Current antimicrobial regimen includes a mismatch between antimicrobial dosage and estimated renal function.  As per policy approved by the Pharmacy & Therapeutics and Medical Executive Committees, the antimicrobial dosage will be adjusted accordingly.  Current antimicrobial dosage:  piperacillin nadine 3.375g q8hr  Indication: acute cholecystitis  Renal Function:  Estimated Creatinine Clearance: 18.7 mL/min (A) (by C-G formula based on SCr of 2.94 mg/dL (H)).     Antimicrobial dosage has been changed to:  3.375g q12hr  Additional comments:   Thank you for allowing pharmacy to be a part of this patient's care.  Jinnie Door, PharmD, BCPS, BCCP Clinical Pharmacist  Please check AMION for all [facility] Pharmacy phone numbers After 10:00 PM, call Main Pharmacy [phone]

## 2023-12-06 NOTE — Assessment & Plan Note (Addendum)
 12-06-2023 baseline scr 1.55-1.75. continue with IVF. Monitor for volume overload. Repeat CMP in AM. Avoid IV contrast and nephrotoxic agents.

## 2023-12-06 NOTE — Progress Notes (Signed)
 Called floor to ask RN if patient was able to come to CT for STAT exam of chest. RN informed me that he wasn't stable enough to travel and was about to go on heated high flow oxygen at this time. Will inform department when scan can be completed at a better time.

## 2023-12-06 NOTE — Assessment & Plan Note (Signed)
Replete with po kcl.

## 2023-12-06 NOTE — Assessment & Plan Note (Signed)
 12-06-2023 stable.

## 2023-12-06 NOTE — Hospital Course (Signed)
 HPI: Roberto Dickson is a 83 y.o. 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   Patient had been exposed to a grandson with upper respiratory symptoms and the patient himself developed a mild cough and general malaise on 12/01/2023.  By 12/03/2023, patient was feeling much better but then had acute onset postprandial nausea and vomiting.  He complained of some abdominal pain at that time but none since.  He has since gone on to develop profuse diarrhea, fevers, and lethargy.  He is no longer vomiting.   ED Course: Upon arrival to the ED, patient is found to be febrile to 38.3 C and saturating low 90s on room air with normal RR, slightly elevated heart rate, and systolic blood pressure of 95 and greater.  Labs are most notable for creatinine 2.25, AST 158, ALT 123, total bilirubin 2.9, WBC 22,700, normal lipase, normal lactic acid, and INR 2.4.  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   Surgery was consulted by the ED physician and the patient was given a liter of NS and Zosyn .  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   Antibiotic Therapy: Anti-infectives (From admission, onward)    Start     Dose/Rate Route Frequency Ordered Stop   12/06/23 0500  piperacillin -tazobactam (ZOSYN ) IVPB 3.375 g        3.375 g 12.5 mL/hr over 240 Minutes Intravenous Every 8 hours 12/05/23 1951      12/05/23 2000  piperacillin -tazobactam (ZOSYN ) IVPB 3.375 g        3.375 g 100 mL/hr over 30 Minutes Intravenous  Once 12/05/23 1949 12/05/23 2053       Procedures:   Consultants: General surgery Cardiology

## 2023-12-06 NOTE — Assessment & Plan Note (Addendum)
 12-06-2023 general surgery consulted. No plans for surgery at this time. Continue with IV ABX with Zosyn . Clear liquids. Cardiology consulted per surgery request for pre-op clearance. Discussed with general surgery. Continue to hold coumadin  for now. Surgery will re-assess in AM and give clearance to restart or not tomorrow.

## 2023-12-06 NOTE — Assessment & Plan Note (Signed)
 12/06/2023 present on admission. Acute cholecystitis on RUQ U/S, CT abd. WBC 22.7, HR 111, fever 100.9

## 2023-12-06 NOTE — Assessment & Plan Note (Signed)
 12/06/2023 pt not on home O2. INR therapeutic 2.3.  echo 01-2023 shows interval resolution of prior ventricular thrombus. Will check CT chest without contrast due to AKI/CKD.

## 2023-12-06 NOTE — ED Notes (Signed)
 ED TO INPATIENT HANDOFF REPORT  ED Nurse Name and Phone #: Norlene Levels [phone]  S Name/Age/Gender Roberto Dickson 83 y.o. male Room/Bed: 001C/001C  Code Status   Code Status: Full Code  Home/SNF/Other Home Patient oriented to: self, place, time, and situation Is this baseline? Yes   Triage Complete: Triage complete  Chief Complaint Acute cholecystitis [K81.0]  Triage Note Patient with cold symptoms on Saturday and Sunday, vomited on Sunday. Feeling better but weak on Monday and Tuesday and now today having profuse diarrhea.    Allergies No Known Allergies  Level of Care/Admitting Diagnosis ED Disposition     ED Disposition  Admit   Condition  --   Comment  Hospital Area: [facility] [[id number]]  Level of Care: Progressive [102]  Admit to Progressive based on following criteria: MULTISYSTEM THREATS such as stable sepsis, metabolic/electrolyte imbalance with or without encephalopathy that is responding to early treatment.  May admit patient to [facility] or [facility] if equivalent level of care is available:: No  Covid Evaluation: Asymptomatic - no recent exposure (last 10 days) testing not required  Diagnosis: Acute cholecystitis [575.0.ICD-9-CM]  Admitting Physician: CHARLTON EVALENE RAMAN [[id number]]  Attending Physician: CHARLTON EVALENE RAMAN [[id number]]  Certification:: I certify this patient will need inpatient services for at least 2 midnights  Expected Medical Readiness: 12/08/2023          B Medical/Surgery History Past Medical History:  Diagnosis Date   Bilateral hip pain 01/25/2023   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   Coronary artery disease of native artery of native heart with stable angina pectoris (HCC)    HFrEF (heart failure with reduced ejection fraction) (HCC) 05/03/2023   History of seizure 08/24/2022   History of stroke 11/09/2021   Hyperlipidemia 10/21/2021   Hypertension    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    Sleep disturbance    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/12/2021   Past Surgical History:  Procedure Laterality Date   APPENDECTOMY     in his 85's   CORONARY ANGIOGRAPHY N/A 10/20/2021   Procedure: CORONARY ANGIOGRAPHY;  Surgeon: Verlin Lonni BIRCH, MD;  Location: [facility] INVASIVE CV LAB;  Service: Cardiovascular;  Laterality: N/A;   IR CT HEAD LTD  07/19/2021   IR FLUORO GUIDED NEEDLE PLC ASPIRATION/INJECTION LOC  07/21/2021   IR PERCUTANEOUS ART THROMBECTOMY/INFUSION INTRACRANIAL INC DIAG ANGIO  07/19/2021   RADIOLOGY WITH ANESTHESIA N/A 07/19/2021   Procedure: IR WITH ANESTHESIA;  Surgeon: Dolphus Carrion, MD;  Location: [facility] OR;  Service: Radiology;  Laterality: N/A;     A IV Location/Drains/Wounds Patient Lines/Drains/Airways Status     Active Line/Drains/Airways     Name Placement date Placement time Site Days   Peripheral IV 12/05/23 20 G Posterior;Right Forearm 12/05/23  1737  Forearm  1   Peripheral IV 12/06/23 22 G 1 Left Forearm 12/06/23  0452  Forearm  less than 1            Intake/Output Last 24 hours  Intake/Output Summary (Last 24 hours) at 12/06/2023 1206 Last data filed at 12/06/2023 0048 Gross per 24 hour  Intake 550 ml  Output --  Net 550 ml    Labs/Imaging Results for orders placed or performed during the hospital encounter of 12/05/23 (from the past 48 hours)  Lipase, blood     Status: None   Collection Time: 12/05/23  2:49 PM  Result Value Ref Range   Lipase 22 11 - 51 U/L    Comment: Performed at  [facility], [address] [place], [place] [address]  Comprehensive metabolic panel     Status: Abnormal   Collection Time: 12/05/23  2:49 PM  Result Value Ref Range   Sodium 139 135 - 145 mmol/L   Potassium 3.9 3.5 - 5.1 mmol/L   Chloride 108 98 - 111 mmol/L   CO2 17 (L) 22 - 32 mmol/L   Glucose, Bld 99 70 - 99 mg/dL    Comment: Glucose reference range applies only to samples taken after fasting for at least 8 hours.   BUN 34 (H) 8 - 23  mg/dL   Creatinine, Ser 7.74 (H) 0.61 - 1.24 mg/dL   Calcium  9.0 8.9 - 10.3 mg/dL   Total Protein 6.8 6.5 - 8.1 g/dL   Albumin  3.3 (L) 3.5 - 5.0 g/dL   AST 841 (H) 15 - 41 U/L   ALT 123 (H) 0 - 44 U/L   Alkaline Phosphatase 82 38 - 126 U/L   Total Bilirubin 2.9 (H) 0.0 - 1.2 mg/dL   GFR, Estimated 29 (L) >60 mL/min    Comment: (NOTE) Calculated using the CKD-EPI Creatinine Equation (2021)    Anion gap 14 5 - 15    Comment: Performed at [facility], [address] [place], [place] [address]  CBC     Status: Abnormal   Collection Time: 12/05/23  2:49 PM  Result Value Ref Range   WBC 22.7 (H) 4.0 - 10.5 K/uL   RBC 4.43 4.22 - 5.81 MIL/uL   Hemoglobin 13.7 13.0 - 17.0 g/dL   HCT 57.4 60.9 - 47.9 %   MCV 95.9 80.0 - 100.0 fL   MCH 30.9 26.0 - 34.0 pg   MCHC 32.2 30.0 - 36.0 g/dL   RDW 85.3 88.4 - 84.4 %   Platelets 189 150 - 400 K/uL   nRBC 0.0 0.0 - 0.2 %    Comment: Performed at [facility], [address], [place], [place] [address]  Blood culture (routine x 2)     Status: None (Preliminary result)   Collection Time: 12/05/23  6:38 PM   Specimen: BLOOD RIGHT HAND  Result Value Ref Range   Specimen Description BLOOD RIGHT HAND    Special Requests      BOTTLES DRAWN AEROBIC AND ANAEROBIC Blood Culture results may not be optimal due to an inadequate volume of blood received in culture bottles   Culture      NO GROWTH < 12 HOURS Performed at [facility], [address], [place], [place] [address]    Report Status PENDING   Resp panel by RT-PCR (RSV, Flu A&B, Covid) Anterior Nasal Swab     Status: None   Collection Time: 12/05/23  6:41 PM   Specimen: Anterior Nasal Swab  Result Value Ref Range   SARS Coronavirus 2 by RT PCR NEGATIVE NEGATIVE   Influenza A by PCR NEGATIVE NEGATIVE   Influenza B by PCR NEGATIVE NEGATIVE    Comment: (NOTE) The Xpert Xpress SARS-CoV-2/FLU/RSV plus assay is intended as an aid in the diagnosis of influenza from  Nasopharyngeal swab specimens and should not be used as a sole basis for treatment. Nasal washings and aspirates are unacceptable for Xpert Xpress SARS-CoV-2/FLU/RSV testing.  Fact Sheet for Patients: bloggercourse.com  Fact Sheet for Healthcare Providers: seriousbroker.it  This test is not yet approved or cleared by the [place]  FDA and has been authorized for detection and/or diagnosis of SARS-CoV-2 by FDA under  an Emergency Use Authorization (EUA). This EUA will remain in effect (meaning this test can be used) for the duration of the COVID-19 declaration under Section 564(b)(1) of the Act, 21 U.S.C. section 360bbb-3(b)(1), unless the authorization is terminated or revoked.     Resp Syncytial Virus by PCR NEGATIVE NEGATIVE    Comment: (NOTE) Fact Sheet for Patients: bloggercourse.com  Fact Sheet for Healthcare Providers: seriousbroker.it  This test is not yet approved or cleared by the [place]  FDA and has been authorized for detection and/or diagnosis of SARS-CoV-2 by FDA under an Emergency Use Authorization (EUA). This EUA will remain in effect (meaning this test can be used) for the duration of the COVID-19 declaration under Section 564(b)(1) of the Act, 21 U.S.C. section 360bbb-3(b)(1), unless the authorization is terminated or revoked.  Performed at [facility], [address], [place], [place] [address]   Blood culture (routine x 2)     Status: None (Preliminary result)   Collection Time: 12/05/23  6:42 PM   Specimen: BLOOD LEFT HAND  Result Value Ref Range   Specimen Description BLOOD LEFT HAND    Special Requests      BOTTLES DRAWN AEROBIC AND ANAEROBIC Blood Culture results may not be optimal due to an inadequate volume of blood received in culture bottles   Culture      NO GROWTH < 12 HOURS Performed at [facility], [address],  [place], [place] [address]    Report Status PENDING   Protime-INR     Status: Abnormal   Collection Time: 12/05/23  6:42 PM  Result Value Ref Range   Prothrombin Time 26.3 (H) 11.4 - 15.2 seconds   INR 2.4 (H) 0.8 - 1.2    Comment: (NOTE) INR goal varies based on device and disease states. Performed at [facility], [address], [place], [place] [address]   I-Stat CG4 Lactic Acid     Status: None   Collection Time: 12/05/23  6:50 PM  Result Value Ref Range   Lactic Acid, Venous 1.7 0.5 - 1.9 mmol/L  Urinalysis, Routine w reflex microscopic -Urine, Clean Catch     Status: Abnormal   Collection Time: 12/05/23  8:53 PM  Result Value Ref Range   Color, Urine AMBER (A) YELLOW    Comment: BIOCHEMICALS MAY BE AFFECTED BY COLOR   APPearance CLOUDY (A) CLEAR   Specific Gravity, Urine 1.017 1.005 - 1.030   pH 5.0 5.0 - 8.0   Glucose, UA NEGATIVE NEGATIVE mg/dL   Hgb urine dipstick LARGE (A) NEGATIVE   Bilirubin Urine NEGATIVE NEGATIVE   Ketones, ur NEGATIVE NEGATIVE mg/dL   Protein, ur 899 (A) NEGATIVE mg/dL   Nitrite NEGATIVE NEGATIVE   Leukocytes,Ua MODERATE (A) NEGATIVE   RBC / HPF >50 0 - 5 RBC/hpf   WBC, UA 21-50 0 - 5 WBC/hpf   Bacteria, UA MANY (A) NONE SEEN   Squamous Epithelial / HPF 0-5 0 - 5 /HPF   Mucus PRESENT    Amorphous Crystal PRESENT     Comment: Performed at [facility], [address], [place], [place] [address]  Culture, blood (x 2)     Status: None (Preliminary result)   Collection Time: 12/05/23 10:13 PM   Specimen: BLOOD  Result Value Ref Range   Specimen Description BLOOD LEFT ANTECUBITAL    Special Requests      BOTTLES DRAWN AEROBIC AND ANAEROBIC Blood Culture adequate volume   Culture  NO GROWTH < 12 HOURS Performed at [facility], [address], [place], [place] [address]    Report Status PENDING   Protime-INR     Status: Abnormal   Collection Time: 12/06/23  5:00 AM  Result Value Ref Range   Prothrombin Time 25.4 (H) 11.4  - 15.2 seconds   INR 2.3 (H) 0.8 - 1.2    Comment: (NOTE) INR goal varies based on device and disease states. Performed at [facility], [address], [place], [place] [address]   Comprehensive metabolic panel     Status: Abnormal   Collection Time: 12/06/23  5:00 AM  Result Value Ref Range   Sodium 136 135 - 145 mmol/L   Potassium 3.4 (L) 3.5 - 5.1 mmol/L   Chloride 107 98 - 111 mmol/L   CO2 19 (L) 22 - 32 mmol/L   Glucose, Bld 88 70 - 99 mg/dL    Comment: Glucose reference range applies only to samples taken after fasting for at least 8 hours.   BUN 44 (H) 8 - 23 mg/dL   Creatinine, Ser 7.05 (H) 0.61 - 1.24 mg/dL   Calcium  8.3 (L) 8.9 - 10.3 mg/dL   Total Protein 5.7 (L) 6.5 - 8.1 g/dL   Albumin  2.9 (L) 3.5 - 5.0 g/dL   AST 806 (H) 15 - 41 U/L   ALT 96 (H) 0 - 44 U/L   Alkaline Phosphatase 74 38 - 126 U/L   Total Bilirubin 2.0 (H) 0.0 - 1.2 mg/dL   GFR, Estimated 21 (L) >60 mL/min    Comment: (NOTE) Calculated using the CKD-EPI Creatinine Equation (2021)    Anion gap 10 5 - 15    Comment: Performed at [facility], [address] [place], [place] [address]  CBC     Status: Abnormal   Collection Time: 12/06/23  5:00 AM  Result Value Ref Range   WBC 18.3 (H) 4.0 - 10.5 K/uL   RBC 4.09 (L) 4.22 - 5.81 MIL/uL   Hemoglobin 12.8 (L) 13.0 - 17.0 g/dL   HCT 61.3 (L) 60.9 - 47.9 %   MCV 94.4 80.0 - 100.0 fL   MCH 31.3 26.0 - 34.0 pg   MCHC 33.2 30.0 - 36.0 g/dL   RDW 85.3 88.4 - 84.4 %   Platelets 147 (L) 150 - 400 K/uL   nRBC 0.0 0.0 - 0.2 %    Comment: Performed at [facility], [address] [place], [place] [address]   DG CHEST PORT 1 VIEW Result Date: 12/06/2023 CLINICAL DATA:  Respiratory failure, hypoxia EXAM: PORTABLE CHEST 1 VIEW COMPARISON:  01/09/2023 FINDINGS: Low volume AP portable examination. Cardiomegaly. No acute airspace opacity. No acute osseous findings. IMPRESSION: Low volume AP portable examination. Cardiomegaly without acute  abnormality of the lungs. Electronically Signed   By: Marolyn JONETTA Jaksch M.D.   On: 12/06/2023 07:48   US  Abdomen Limited RUQ (LIVER/GB) Result Date: 12/05/2023 CLINICAL DATA:  151471 RUQ pain 151471 EXAM: ULTRASOUND ABDOMEN LIMITED RIGHT UPPER QUADRANT COMPARISON:  CT abdomen pelvis 12/05/2023 FINDINGS: Gallbladder: Gallbladder sludge within the gallbladder lumen. Gallbladder wall thickening and pericholecystic fluid. No sonographic Murphy sign noted by sonographer. Common bile duct: Diameter: 3 mm Liver: No focal lesion identified. Within normal limits in parenchymal echogenicity. Portal vein is patent on color Doppler imaging with normal direction of blood flow towards the liver. Other: None. IMPRESSION: Gallbladder sludge with acute cholecystitis. Electronically Signed   By: Morgane  Naveau M.D.  On: 12/05/2023 21:33   CT ABDOMEN PELVIS WO CONTRAST Result Date: 12/05/2023 CLINICAL DATA:  Abdominal pain. EXAM: CT ABDOMEN AND PELVIS WITHOUT CONTRAST TECHNIQUE: Multidetector CT imaging of the abdomen and pelvis was performed following the standard protocol without IV contrast. RADIATION DOSE REDUCTION: This exam was performed according to the departmental dose-optimization program which includes automated exposure control, adjustment of the mA and/or kV according to patient size and/or use of iterative reconstruction technique. COMPARISON:  07/21/2021. FINDINGS: Lower chest: Bibasilar consolidation may represent pneumonia or volume loss. Cardiomegaly. Atheromatous calcifications. Hepatobiliary: No focal abnormality identified in the liver without contrast. The gallbladder is distended. There is pericholecystic fluid and fat stranding. These findings suggest cholecystitis which can be assessed further with HIDA scan, if indicated. Pancreas: Unremarkable. No pancreatic ductal dilatation or surrounding inflammatory changes. Spleen: Normal in size without focal abnormality. Adrenals/Urinary Tract: No adrenal lesions.  No hydronephrosis or nephrolithiasis. Right kidney 1.5 cm lesion consistent with a cyst with calcification. No ureteral stones. Urinary bladder was nearly empty with wall thickening which could be due to inflammation or hypertrophy. Stomach/Bowel: Loops of small bowel are dilated in the midabdomen without a discrete transition point to normal caliber bowel distally. This could represent a enteritis. Normal stool and air in the rectosigmoid. Vascular/Lymphatic: Aortic atherosclerosis. No enlarged abdominal or pelvic lymph nodes. Distal abdominal aortic aneurysm measuring 4.1 cm. Follow up with CTA or MRA in 12 months. Reproductive: Prostate is enlarged, 7.0 x 6.3 x 5.7 cm. Other: Left inguinal hernia noted that contains omental fat. No herniated bowel or evidence of incarceration. Musculoskeletal: Thoracolumbosacral degenerative changes. IMPRESSION: 1. Findings suspicious for cholecystitis. 2. Bibasilar consolidation. 3. Enlarged prostate. 4. Dilated small bowel loops without a discrete transition point. This could represent a nonspecific enteritis. 5. Abdominal aortic aneurysm, 4.1 cm. Follow up with CTA or MRA in 12 months. 6. Left inguinal hernia containing omental fat. 7. Aortic atherosclerosis (ICD10-I70.0). Electronically Signed   By: Fonda Field M.D.   On: 12/05/2023 19:24    Pending Labs Unresulted Labs (From admission, onward)     Start     Ordered   12/06/23 0500  Protime-INR  Daily,   R      12/05/23 2047   12/06/23 0500  Comprehensive metabolic panel  Daily,   R      12/05/23 2047   12/06/23 0500  CBC  Daily,   R      12/05/23 2047   12/05/23 2047  Culture, blood (x 2)  BLOOD CULTURE X 2,   R (with TIMED occurrences)     Comments: INITIATE ANTIBIOTICS WITHIN 1 HOUR AFTER BLOOD CULTURES DRAWN.  If unable to obtain blood cultures, call MD immediately regarding antibiotic instructions.    12/05/23 2047   12/05/23 2013  Gastrointestinal Panel by PCR , Stool  (Gastrointestinal Panel by  PCR, Stool                                                                                                                                                     **  Does Not include CLOSTRIDIUM DIFFICILE testing. **If CDIFF testing is needed, place order from the C Difficile Testing order set.**)  Once,   R        12/05/23 2012   12/05/23 2013  C Difficile Quick Screen w PCR reflex  (C Difficile quick screen w PCR reflex panel )  Once, for 24 hours,   TIMED       References:    CDiff Information Tool   12/05/23 2012            Vitals/Pain Today's Vitals   12/06/23 1000 12/06/23 1015 12/06/23 1030 12/06/23 1036  BP: 120/76 124/81 111/79   Pulse: (!) 107 85 (!) 109   Resp: 19 (!) 37 (!) 23   Temp:    98.6 F (37 C)  TempSrc:    Oral  SpO2: 90% 91% 91%   PainSc:        Isolation Precautions Enteric precautions (UV disinfection)  Medications Medications  piperacillin -tazobactam (ZOSYN ) IVPB 3.375 g (0 g Intravenous Stopped 12/06/23 0907)  levETIRAcetam  (KEPPRA ) tablet 500 mg (500 mg Oral Given 12/06/23 1034)  sodium chloride  flush (NS) 0.9 % injection 3 mL (0 mLs Intravenous Hold 12/06/23 0908)  acetaminophen  (TYLENOL ) tablet 650 mg (650 mg Oral Given 12/06/23 0450)    Or  acetaminophen  (TYLENOL ) suppository 650 mg ( Rectal See Alternative 12/06/23 0450)  fentaNYL  (SUBLIMAZE ) injection 12.5-50 mcg (has no administration in time range)  ondansetron  (ZOFRAN ) tablet 4 mg (has no administration in time range)    Or  ondansetron  (ZOFRAN ) injection 4 mg (has no administration in time range)  lactated ringers  infusion (50 mL/hr Intravenous Rate/Dose Verify 12/06/23 0637)  sodium chloride  0.9 % bolus 1,000 mL (0 mLs Intravenous Stopped 12/06/23 0048)  piperacillin -tazobactam (ZOSYN ) IVPB 3.375 g (0 g Intravenous Stopped 12/05/23 2053)  acetaminophen  (TYLENOL ) tablet 500 mg (500 mg Oral Given 12/05/23 2027)    Mobility non-ambulatory     Focused Assessments    R Recommendations: See Admitting  Provider Note  Report given to:   Additional Notes:

## 2023-12-06 NOTE — Consult Note (Signed)
 NAME:  Roberto Dickson, MRN:  [id number], DOB:  11/14/1941, LOS: 1 ADMISSION DATE:  12/05/2023, CONSULTATION DATE:  1/2 REFERRING MD:  chen, CHIEF COMPLAINT:  resp failure and sepsis   History of Present Illness:  83 year old male patient with multiple medical comorbidities who presented to the emergency room on 1/1 for evaluation of nausea, vomiting, diarrhea and worsening weakness.  Diagnostic evaluation was consistent with acute cholecystitis on both ultrasound and abdominal CT.  He was placed on IV antibiotics, administered IV crystalloid, seen by surgical services Who felt may need surgical intervention however wanted cardiology to evaluate.  He was deemed a high risk cardiac patient and initially the plan was to treat him medically with antibiotics and supportive care.  He was seen on the a.m. of 1/2 at that point still had some reproducible abdominal pain to palpation his white blood cell count was down some, and his bilirubin had dropped from 2.9-2.,  White blood cell count went from 22.7-18.3.  Over the course of the first 24 hours the patient initially was on supplemental oxygen at 4 L, however over the course of the last 12 hours starting from early a.m. on 1/2 through early afternoon on 1/2 patient's oxygen requirements have gone from 4 L up to 14 L high flow.  He has become tachycardic, confused, and persistently hypoxic in spite of escalating oxygen support.  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  Candidate for PCI intervention and treated medically. 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  Echocardiogram currently suggesting resolved CVA 2022 receiving tPA and  mechanical thrombectomy Hypertension Stage IIIb kidney disease Hyperlipidemia  Significant Hospital Events: Including procedures, antibiotic start and stop dates in addition to other pertinent events   1/1 admitted with acute cholecystitis placed on IV antibiotics seen by cardiology felt high risk for surgery seen by surgery.  Initially treatment was to be supportive with IV hydration, antibiotics and observation.  Was hypoxic on presentation requiring 4 L 1/2 progressive oxygen requirements over the course of the day up to 14 L high flow and remaining in the mid 80s.  Worsening tachycardia worsening delirium  Interim History / Subjective:  Confused. Restless   Objective   Blood pressure 113/69, pulse (!) 121, temperature 98.6 F (37 C), temperature source Oral, resp. rate 20, height 5' 7 (1.702 m), weight 78.9 kg, SpO2 90%.        Intake/Output Summary (Last 24 hours) at 12/06/2023 1727 Last data filed at 12/06/2023 1537 Gross per 24 hour  Intake 1381.79 ml  Output --  Net 1381.79 ml   Filed Weights   12/06/23 1300  Weight: 78.9 kg    Examination: General: 82 year old male patient lying in bed, respiratory mildly labored, confused but not in acute distress HENT: Mucous membranes are dry neck veins flat Lungs: Bilateral rales, mild accessory use, pulse oximetry currently in the mid 80s on 14 L Cardiovascular: Tachycardic rhythm Abdomen: Soft, distended, tympanic to percussion Extremities: Warm dry brisk capillary refill Neuro: Awake moves all extremities generalized weakness, confused GU: Due to void  Resolved Hospital Problem list     Assessment & Plan:   Severe sepsis with worsening multiple organ failure secondary to  acute acalculous cholecystitis Acute hypoxic respiratory failure 2/2 aspiration PNA and Evolving ARDS Acute on chronic renal failure, stage IIIb baseline Metabolic encephalopathy 2/2 sepsis and worsened by hypoxia Af w/ RVR HFrEF H/o LV  thrombus Remote CVA Severe deconditioning Pain DNR status Comfort care status   Discussion 83 year old male patient admitted with acalculous cholecystitis and related septic shock with progressive multiorgan failure, further complicated by probable aspiration pneumonia and evolving ARDS.  He is clinically declined significantly over the last 24 hours with worsening respiratory failure renal failure and encephalopathy.  I had the opportunity to speak with the patient's daughter Roberto Dickson, his spouse Roberto Dickson, and grandson.  Roberto Dickson shared with me that during COVID Roberto Dickson made it very clear to her that he would not want invasive interventions or prolonged hospital stay.  He is deeply concerned about the welfare of his family, and does not want to be a burden to them.  At this point given the severity of his illness resuscitation efforts would require transfer to intensive care, probable intubation, probable central line insertion, percutaneous cholecystostomy tube, and prolonged hospital stay.  His family shares with me that would not be able to care for him at home, and he would not want to go to a skilled facility following this.  Based on the patient's values, what he is shared with his family, and my conversation with the family given the severity of his illness the family has decided to proceed with transition to comfort care.   Plan Continuing supplemental oxygen, but no titration Discontinue all interventions that do not address comfort Diet as tolerated, if he wants something to drink he should have it Morphine  infusion, with bolus for as needed worsening shortness of breath or pain As needed Ativan  for anxiety As needed Haldol  for worsening delirium   Best Practice (right click and Reselect all SmartList Selections daily)   Per primary  Labs   CBC: Recent Labs  Lab 12/05/23 1449 12/06/23 0500  WBC 22.7* 18.3*  HGB 13.7 12.8*  HCT 42.5 38.6*  MCV 95.9 94.4  PLT 189 147*    Basic  Metabolic Panel: Recent Labs  Lab 12/05/23 1449 12/06/23 0500  NA 139 136  K 3.9 3.4*  CL 108 107  CO2 17* 19*  GLUCOSE 99 88  BUN 34* 44*  CREATININE 2.25* 2.94*  CALCIUM  9.0 8.3*   GFR: Estimated Creatinine Clearance: 18.7 mL/min (A) (by C-G formula based on SCr of 2.94 mg/dL (H)). Recent Labs  Lab 12/05/23 1449 12/05/23 1850 12/06/23 0500  WBC 22.7*  --  18.3*  LATICACIDVEN  --  1.7  --     Liver Function Tests: Recent Labs  Lab 12/05/23 1449 12/06/23 0500  AST 158* 193*  ALT 123* 96*  ALKPHOS 82 74  BILITOT 2.9* 2.0*  PROT 6.8 5.7*  ALBUMIN  3.3* 2.9*   Recent Labs  Lab 12/05/23 1449  LIPASE 22   No results for input(s): AMMONIA in the last 168 hours.  ABG    Component Value Date/Time   PHART 7.44 12/06/2023 1511   PCO2ART 28 (L) 12/06/2023 1511   PO2ART 57 (L) 12/06/2023 1511   HCO3 19.0 (L) 12/06/2023 1511   TCO2 20 (L) 01/09/2023 1205   ACIDBASEDEF 3.8 (H) 12/06/2023 1511   O2SAT 91.8 12/06/2023 1511     Coagulation Profile: Recent Labs  Lab 12/04/23 1555 12/05/23 1842 12/06/23 0500  INR 2.8 2.4* 2.3*    Cardiac Enzymes: No results for input(s): CKTOTAL, CKMB, CKMBINDEX, TROPONINI in  the last 168 hours.  HbA1C: Hgb A1c MFr Bld  Date/Time Value Ref Range Status  07/20/2021 05:36 AM 5.9 (H) 4.8 - 5.6 % Final    Comment:    (NOTE) Pre diabetes:          5.7%-6.4%  Diabetes:              >6.4%  Glycemic control for   <7.0% adults with diabetes     CBG: No results for input(s): GLUCAP in the last 168 hours.  Review of Systems:   Not able  Past Medical History:  He,  has a past medical history of Bilateral hip pain (01/25/2023), CAD (coronary artery disease) (10/20/2021), Coronary artery disease of native artery of native heart with stable angina pectoris (HCC), HFrEF (heart failure with reduced ejection fraction) (HCC) (05/03/2023), History of seizure (08/24/2022), History of stroke (11/09/2021), Hyperlipidemia  (10/21/2021), Hypertension, Left ventricular thrombus (08/12/2021), Sleep disturbance, and Thrombus in heart chamber (08/12/2021).   Surgical History:   Past Surgical History:  Procedure Laterality Date   APPENDECTOMY     in his 3's   CORONARY ANGIOGRAPHY N/A 10/20/2021   Procedure: CORONARY ANGIOGRAPHY;  Surgeon: Verlin Lonni BIRCH, MD;  Location: [facility];  Service: Cardiovascular;  Laterality: N/A;   IR CT HEAD LTD  07/19/2021   IR FLUORO GUIDED NEEDLE PLC ASPIRATION/INJECTION LOC  07/21/2021   IR PERCUTANEOUS ART THROMBECTOMY/INFUSION INTRACRANIAL INC DIAG ANGIO  07/19/2021   RADIOLOGY WITH ANESTHESIA N/A 07/19/2021   Procedure: IR WITH ANESTHESIA;  Surgeon: Dolphus Carrion, MD;  Location: [facility] OR;  Service: Radiology;  Laterality: N/A;     Social History:   reports that he has never smoked. He has never used smokeless tobacco. He reports that he does not currently use alcohol after a past usage of about 1.0 standard drink of alcohol per week. He reports that he does not use drugs.   Family History:  His family history includes Cancer - Prostate in his father.   Allergies No Known Allergies   Home Medications  Prior to Admission medications   Medication Sig Start Date End Date Taking? Authorizing Provider  acetaminophen  (TYLENOL ) 325 MG tablet Take 1-2 tablets (325-650 mg total) by mouth every 4 (four) hours as needed for mild pain. Patient taking differently: Take 325-650 mg by mouth daily as needed for mild pain (pain score 1-3). 08/10/21  Yes Love, Sharlet RAMAN, PA-C  amLODipine  (NORVASC ) 5 MG tablet TAKE 1 TABLET(5 MG) BY MOUTH DAILY 02/16/23  Yes Sagardia, Emil Schanz, MD  aspirin  EC (ASPIRIN  LOW DOSE) 81 MG tablet Take 1 tablet (81 mg total) by mouth daily. Swallow whole. 10/19/22  Yes Lonni Slain, MD  docusate sodium  (COLACE) 100 MG capsule Take 1 capsule (100 mg total) by mouth 2 (two) times daily. Patient taking differently: Take 200 mg by mouth  daily. 08/10/21  Yes Love, Sharlet RAMAN, PA-C  furosemide  (LASIX ) 20 MG tablet TAKE 1 TABLET( 20 MG TOTAL) BY MOUTH ONCE A WEEK. 10/26/23  Yes Walker, Caitlin S, NP  levETIRAcetam  (KEPPRA ) 500 MG tablet Take 1 tablet (500 mg total) by mouth 2 (two) times daily. 05/08/23 05/02/24 Yes Sagardia, Emil Schanz, MD  melatonin 3 MG TABS tablet Take 0.5 tablets (1.5 mg total) by mouth at bedtime. OVER THE COUNTER Patient taking differently: Take 1.5 mg by mouth at bedtime. 08/10/21  Yes Love, Sharlet RAMAN, PA-C  metoprolol  succinate (TOPROL -XL) 50 MG 24 hr tablet TAKE 1 TABLET(50 MG) BY MOUTH AT BEDTIME 11/07/23  Yes Lonni Slain, MD  pantoprazole  (PROTONIX ) 40 MG tablet TAKE 1 TABLET(40 MG) BY MOUTH AT BEDTIME 08/12/23  Yes Sagardia, Emil Schanz, MD  tamsulosin  (FLOMAX ) 0.4 MG CAPS capsule TAKE 1 CAPSULE BY MOUTH DAILY 01/18/23  Yes Sagardia, Emil Schanz, MD  tiZANidine  (ZANAFLEX ) 2 MG tablet TAKE 1 TABLET BY MOUTH AT BEDTIME AS NEEDED FOR MUSCLE SPASMS 09/30/23  Yes Sagardia, Miguel Jose, MD  warfarin (COUMADIN ) 6 MG tablet TAKE 1 TABLET BY MOUTH DAILY AS DIRECTED BY COAGULATION CLINIC Patient taking differently: Take 3-6 mg by mouth See admin instructions. Take 1/2 tablet by mouth every Sun, Tues, Fri, then take 1 tablet all other days or AS DIRECTED BY COAGULATION CLINIC 06/18/23  Yes Lonni Slain, MD  atorvastatin  (LIPITOR ) 80 MG tablet TAKE 1 TABLET(80 MG) BY MOUTH DAILY 03/03/23   Purcell Emil Schanz, MD  ciclopirox  (PENLAC ) 8 % solution Apply topically at bedtime. Apply over nail and surrounding skin. Apply daily over previous coat. After seven (7) days, may remove with alcohol and continue cycle. 11/12/23   Gershon Donnice SAUNDERS, DPM     Critical care time:  My critical care time was 45 minutes this included in-depth discussion with the family, in collaboration with surgical and medical services

## 2023-12-06 NOTE — Consult Note (Addendum)
 Cardiology Consultation   Patient ID: Roberto Dickson MRN: [id number]; DOB: [birth year]  Admit date: 12/05/2023 Date of Consult: 12/06/2023  PCP:  Roberto Emil Schanz, MD   [facility] Providers Cardiologist:  Roberto Bruckner, MD  Cardiology APP:  Roberto Roberto RAMAN, NP       Patient Profile:   Roberto Dickson is a 83 y.o. 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 Tanda.  History of Present Illness:   Roberto Dickson is a 83 year old male with the above history who is followed by Dr. Bruckner. He was admitted in 07/2021 for an acute stroke that was treated with tPA. Head neck/ CT showed occlusion of left ICA at origion withi occlusion to the carotid terminus, so he also underwent cerebral angiography with revascularization of the left ICA from proximal to distal terminus and origin of the left MCA. Echo a that time showed LVEF of 50-55% with akinesis of the apical, apical anterior, and apical septal segments as well as hypokinesis of the inferoseptal segment. Also showed an apical thrombus. He was started on Coumadin . Outpatient stress test in 09/2021 for further evaluation of wall motion abnormalities showed a large severe mid to apical inferior mostly fixed perfusion defect suggestive of a large RCA territory scar with peri-infarct ischemia. Therefore, he underwent a coronary CTA later tha month which showed a coronary calcium  score of 6,053 and significant three vessel disease with likely CTO of LAD. This led to a cardiac catheterization in 10/2021 which showed severe 3 vessel CAD with CTO of RCA and LAD. He was  not felt to be a candidate for [facility]. He was seen by Roberto Dickson and medical management was recommended. Repeat Echo in 01/2022 showed LVEF of 45-50% with wall motion abnormalities and grade 1 diastolic dysfunction. There was swirling artifact at the apex suggesting slow low but previous LV thrombus had resolved. Given akinetic apex, it was recommended that he continue Coumadin . Most recent Echo in 01/2023 during an admission for acute respiratory failure in the setting of COVID showed LVEF of 45-50% with apical akinesis and swirling artifact at the apex but no clear thrombus as well as mild dilatation of the aortic root and ascending aorta measuring 42mm and 39mm respectively.  He was last seen by Roberto Vannie, NP, in 09/2023 at which time he reported 2 fainting spells after coming out of the shower 6 weeks prior. EMS reportedly felt like he was dehydrated and gave IV fluids but he did not require hospitalization. He was otherwise doing well.   Patient presented to the ED on 12/05/2023 for further evaluation of nausea/ vomiting and diarrhea as well as a cough. Work-up revealed acute cholecystis on both abdominal/ pelvic CT and RUQ abdominal ultrasound.  General Surgery was consulted and we were asked to see for pre-op clearance.   Patient is Spanish speaking so [employer] interpreter (ID # [id number]) used throughout entirety of visit. It was difficult to get a history even with an interpreter and interpreter frequently had to repeat the question. No family was at bedside. I had to asked the patient multiple times what brought him to the  hospital. He finally said his daughter brought him to the hospital due to significant weakness since 12/31. He also had some nausea and vomiting on 12/31 and has had diarrhea since then. He also described some abdominal pain initially but when I asked him about that again later he denied this. He has a wet sounding cough on exam and states he has had this for about 1.5 months. He states  it is non-productive. From a cardiac standpoint, it sounds like he is doing well. He denies any chest pain. He is currently on 3L of O2 but denies feeling short of breath. He denies any orthopnea, PND, edema, palpitations, lightheadedness, dizziness, or syncope.  He is currently tachycardic with rates in the 100s to 120s but is asymptomatic with this.   He states he is not very active at home. He states he walks for 20 minutes after breakfast and dinner though and denies any chest pain or shortness of breath wit this.   Past Medical History:  Diagnosis Date   Bilateral hip pain 01/25/2023   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   Coronary artery disease of native artery of native heart with stable angina pectoris (HCC)    HFrEF (heart failure with reduced ejection fraction) (HCC) 05/03/2023   History of seizure 08/24/2022   History of stroke 11/09/2021   Hyperlipidemia 10/21/2021   Hypertension    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   Sleep disturbance    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/12/2021    Past Surgical History:  Procedure Laterality Date   APPENDECTOMY     in his 56's   CORONARY ANGIOGRAPHY N/A 10/20/2021   Procedure: CORONARY ANGIOGRAPHY;  Surgeon: Roberto Lonni BIRCH, MD;  Location: [facility] INVASIVE CV LAB;  Service: Cardiovascular;  Laterality: N/A;   IR CT HEAD LTD  07/19/2021   IR FLUORO GUIDED NEEDLE PLC ASPIRATION/INJECTION LOC  07/21/2021   IR PERCUTANEOUS ART THROMBECTOMY/INFUSION INTRACRANIAL INC DIAG ANGIO  07/19/2021   RADIOLOGY WITH ANESTHESIA N/A 07/19/2021   Procedure: IR WITH ANESTHESIA;  Surgeon: Roberto Carrion, MD;  Location: [facility] OR;  Service: Radiology;  Laterality: N/A;     Home Medications:  Prior to Admission medications   Medication Sig Start Date End Date Taking? Authorizing Provider  acetaminophen  (TYLENOL ) 325 MG tablet Take 1-2 tablets (325-650 mg total) by mouth every 4 (four) hours as needed for mild pain. Patient taking differently: Take  325-650 mg by mouth daily as needed for mild pain (pain score 1-3). 08/10/21  Yes Love, Sharlet RAMAN, PA-C  amLODipine  (NORVASC ) 5 MG tablet TAKE 1 TABLET(5 MG) BY MOUTH DAILY 02/16/23  Yes Sagardia, Emil Schanz, MD  aspirin  EC (ASPIRIN  LOW DOSE) 81 MG tablet Take 1 tablet (81 mg total) by mouth daily. Swallow whole. 10/19/22  Yes Lonni Slain, MD  docusate sodium  (COLACE) 100 MG capsule Take 1 capsule (100 mg total) by mouth 2 (two) times daily. Patient taking differently: Take 200 mg by mouth daily. 08/10/21  Yes Love, Sharlet RAMAN, PA-C  furosemide  (LASIX ) 20 MG tablet TAKE 1 TABLET( 20 MG TOTAL) BY MOUTH ONCE A WEEK. 10/26/23  Yes Walker, Caitlin S, NP  levETIRAcetam  (KEPPRA ) 500 MG tablet Take 1 tablet (500 mg total) by mouth 2 (two) times daily. 05/08/23 05/02/24 Yes Sagardia, Emil Schanz, MD  melatonin 3 MG TABS tablet Take 0.5 tablets (1.5 mg total) by mouth at bedtime. OVER THE COUNTER Patient taking differently: Take 1.5 mg by mouth at bedtime. 08/10/21  Yes Love, Sharlet RAMAN, PA-C  metoprolol  succinate (  TOPROL -XL) 50 MG 24 hr tablet TAKE 1 TABLET(50 MG) BY MOUTH AT BEDTIME 11/07/23  Yes Lonni Slain, MD  pantoprazole  (PROTONIX ) 40 MG tablet TAKE 1 TABLET(40 MG) BY MOUTH AT BEDTIME 08/12/23  Yes Sagardia, Emil Schanz, MD  tamsulosin  (FLOMAX ) 0.4 MG CAPS capsule TAKE 1 CAPSULE BY MOUTH DAILY 01/18/23  Yes Sagardia, Emil Schanz, MD  tiZANidine  (ZANAFLEX ) 2 MG tablet TAKE 1 TABLET BY MOUTH AT BEDTIME AS NEEDED FOR MUSCLE SPASMS 09/30/23  Yes Sagardia, Miguel Jose, MD  warfarin (COUMADIN ) 6 MG tablet TAKE 1 TABLET BY MOUTH DAILY AS DIRECTED BY COAGULATION CLINIC Patient taking differently: Take 3-6 mg by mouth See admin instructions. Take 1/2 tablet by mouth every Sun, Tues, Fri, then take 1 tablet all other days or AS DIRECTED BY COAGULATION CLINIC 06/18/23  Yes Lonni Slain, MD  atorvastatin  (LIPITOR ) 80 MG tablet TAKE 1 TABLET(80 MG) BY MOUTH DAILY 03/03/23   Roberto Emil Schanz, MD   ciclopirox  (PENLAC ) 8 % solution Apply topically at bedtime. Apply over nail and surrounding skin. Apply daily over previous coat. After seven (7) days, may remove with alcohol and continue cycle. 11/12/23   Gershon Donnice SAUNDERS, DPM    Inpatient Medications: Scheduled Meds:  levETIRAcetam   500 mg Oral BID   sodium chloride  flush  3 mL Intravenous Q12H   Continuous Infusions:  lactated ringers  50 mL/hr (12/06/23 9362)   piperacillin -tazobactam Stopped (12/06/23 0907)   PRN Meds: acetaminophen  **OR** acetaminophen , fentaNYL  (SUBLIMAZE ) injection, ondansetron  **OR** ondansetron  (ZOFRAN ) IV  Allergies:   No Known Allergies  Social History:   Social History   Socioeconomic History   Marital status: Married    Spouse name: Not on file   Number of children: Not on file   Years of education: Not on file   Highest education level: Not on file  Occupational History   Not on file  Tobacco Use   Smoking status: Never   Smokeless tobacco: Never  Vaping Use   Vaping status: Never Used  Substance and Sexual Activity   Alcohol use: Not Currently    Alcohol/week: 1.0 standard drink of alcohol    Types: 1 Cans of beer per week   Drug use: Never   Sexual activity: Not on file  Other Topics Concern   Not on file  Social History Narrative   Not on file   Social Drivers of Health   Financial Resource Strain: Low Risk  (11/23/2022)   Overall Financial Resource Strain (CARDIA)    Difficulty of Paying Living Expenses: Not hard at all  Food Insecurity: No Food Insecurity (11/23/2022)   Hunger Vital Sign    Worried About Running Out of Food in the Last Year: Never true    Ran Out of Food in the Last Year: Never true  Transportation Needs: No Transportation Needs (11/23/2022)   PRAPARE - Administrator, Civil Service (Medical): No    Lack of Transportation (Non-Medical): No  Physical Activity: Sufficiently Active (11/23/2022)   Exercise Vital Sign    Days of Exercise per  Week: 5 days    Minutes of Exercise per Session: 30 min  Stress: No Stress Concern Present (11/23/2022)   [employer] of Occupational Health - Occupational Stress Questionnaire    Feeling of Stress : Not at all  Social Connections: Socially Integrated (11/23/2022)   Social Connection and Isolation Panel [NHANES]    Frequency of Communication with Friends and Family: More than three times a week    Frequency of  Social Gatherings with Friends and Family: More than three times a week    Attends Religious Services: More than 4 times per year    Active Member of [employer] or Organizations: Yes    Attends Engineer, Structural: More than 4 times per year    Marital Status: Married  Catering Manager Violence: Not At Risk (11/23/2022)   Humiliation, Afraid, Rape, and Kick questionnaire    Fear of Current or Ex-Partner: No    Emotionally Abused: No    Physically Abused: No    Sexually Abused: No    Family History:   Family History  Problem Relation Age of Onset   Cancer - Prostate Father      ROS:  Please see the history of present illness.     Physical Exam/Data:   Vitals:   12/06/23 1000 12/06/23 1015 12/06/23 1030 12/06/23 1036  BP: 120/76 124/81 111/79   Pulse: (!) 107 85 (!) 109   Resp: 19 (!) 37 (!) 23   Temp:    98.6 F (37 C)  TempSrc:    Oral  SpO2: 90% 91% 91%     Intake/Output Summary (Last 24 hours) at 12/06/2023 1259 Last data filed at 12/06/2023 0048 Gross per 24 hour  Intake 550 ml  Output --  Net 550 ml      10/25/2023    3:38 PM 09/25/2023    2:19 PM 05/03/2023    3:31 PM  Last 3 Weights  Weight (lbs) 174 lb 9.6 oz 173 lb 174 lb 8 oz  Weight (kg) 79.198 kg 78.472 kg 79.153 kg     There is no height or weight on file to calculate BMI.   General: 83 y.o. male resting comfortably in no acute distress. On 3L of O2 via nasal cannula. HEENT: Normocephalic and atraumatic. Sclera clear.  Neck: Supple. No JVD. Heart: Tachycardic with normal rate.  No  murmurs, gallops, or rubs. Radial and distal pedal pulses 2+ and equal bilaterally. Lungs: No increased work of breathing.  Faint crackles in bilateral bases.  Abdomen: Soft, non-distended, and non-tender to palpation. Extremities: No lower extremity edema.    Skin: Warm and dry. Neuro: No focal deficits. Psych: Normal affect. Responds appropriately.   EKG:  The EKG was personally reviewed and demonstrates:  Sinus tachycardia, rate 120 bpm, with Q waves in inferior leads and leads V1-V3 but no acute ST/T wave changes.   Telemetry:  Telemetry was personally reviewed and demonstrates:  Sinus tachycardia with rates mostly in the 100s to 120s. Some PVCs noted.  Relevant CV Studies:  Cardiac Catheterization 10/20/2021:   Mid RCA lesion is 100% stenosed.   Mid Cx to Dist Cx lesion is 90% stenosed.   1st Mrg lesion is 90% stenosed.   Prox Cx to Mid Cx lesion is 80% stenosed.   Mid LAD to Dist LAD lesion is 100% stenosed.   2nd Diag lesion is 90% stenosed.   Ost LAD to Mid LAD lesion is 30% stenosed.   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 The distal LAD is diffusely diseased and fills from left to left collaterals. The moderate caliber diagonal branch has severe disease. Severe disease in the mid and distal AV groove Circumflex. Severe disease in the first obtuse marginal branch Chronic total occlusion of the dominant RCA in the mid segment. The distal vessel fills from left to right collaterals.    Recommendations: He has an LV thrombus by recent echo and has  been on coumadin . He has been bridged with Lovenox  for this procedure today. I did not cross into the LV today due to the LV thrombus. He will be admitted post cath and we will start IV heparin  8 hours post sheath pull. I would anticipate that he will be watched overnight and then Lovenox  bridging resumed tomorrow if no access site bleeding. Resume coumadin  tomorrow. In regards to his CAD, he has severe three  vessel CAD with CTO of the RCA and LAD. PCI is not an option. His options include medical management vs CABG. Given advanced age, recent stroke and LV thrombus, he may not be a good candidate for CABG. This would be delayed for several months anyway given LV thrombus and recent stroke. We can plan an outpatient CT surgery consult in several weeks post discharge. Fortunately he has overall preserved LV systolic function and is asymptomatic.   Diagnostic Dominance: Right  _______________  Echocardiogram 01/10/2023: Impressions:  1. Left ventricular ejection fraction, by estimation, is 45 to 50%. The  left ventricle has mildly decreased function. The left ventricle  demonstrates regional wall motion abnormalities (see scoring  diagram/findings for description). There is moderate  asymmetric left ventricular hypertrophy of the basal-septal segment. Left  ventricular diastolic parameters are consistent with Grade I diastolic  dysfunction (impaired relaxation).   2. Apical akinesis with contrast images showing swirling artifact at apex  consistent with slow flow but no clear thrombus seen   3. Right ventricular systolic function is normal. The right ventricular  size is normal.   4. Left atrial size was mildly dilated.   5. The mitral valve is normal in structure. No evidence of mitral valve  regurgitation. No evidence of mitral stenosis.   6. The aortic valve was not well visualized. Aortic valve regurgitation  is not visualized. No aortic stenosis is present.   7. Aortic dilatation noted. There is dilatation of the aortic root,  measuring 42 mm. There is dilatation of the ascending aorta, measuring 39  mm.     Laboratory Data:  High Sensitivity Troponin:  No results for input(s): TROPONINIHS in the last 720 hours.   Chemistry Recent Labs  Lab 12/05/23 1449 12/06/23 0500  NA 139 136  K 3.9 3.4*  CL 108 107  CO2 17* 19*  GLUCOSE 99 88  BUN 34* 44*  CREATININE 2.25* 2.94*   CALCIUM  9.0 8.3*  GFRNONAA 29* 21*  ANIONGAP 14 10    Recent Labs  Lab 12/05/23 1449 12/06/23 0500  PROT 6.8 5.7*  ALBUMIN  3.3* 2.9*  AST 158* 193*  ALT 123* 96*  ALKPHOS 82 74  BILITOT 2.9* 2.0*   Lipids No results for input(s): CHOL, TRIG, HDL, LABVLDL, LDLCALC, CHOLHDL in the last 168 hours.  Hematology Recent Labs  Lab 12/05/23 1449 12/06/23 0500  WBC 22.7* 18.3*  RBC 4.43 4.09*  HGB 13.7 12.8*  HCT 42.5 38.6*  MCV 95.9 94.4  MCH 30.9 31.3  MCHC 32.2 33.2  RDW 14.6 14.6  PLT 189 147*   Thyroid No results for input(s): TSH, FREET4 in the last 168 hours.  BNPNo results for input(s): BNP, PROBNP in the last 168 hours.  DDimer No results for input(s): DDIMER in the last 168 hours.   Radiology/Studies:  DG CHEST PORT 1 VIEW Result Date: 12/06/2023 CLINICAL DATA:  Respiratory failure, hypoxia EXAM: PORTABLE CHEST 1 VIEW COMPARISON:  01/09/2023 FINDINGS: Low volume AP portable examination. Cardiomegaly. No acute airspace opacity. No acute osseous findings. IMPRESSION: Low volume AP  portable examination. Cardiomegaly without acute abnormality of the lungs. Electronically Signed   By: Marolyn JONETTA Jaksch M.D.   On: 12/06/2023 07:48   US  Abdomen Limited RUQ (LIVER/GB) Result Date: 12/05/2023 CLINICAL DATA:  151471 RUQ pain 151471 EXAM: ULTRASOUND ABDOMEN LIMITED RIGHT UPPER QUADRANT COMPARISON:  CT abdomen pelvis 12/05/2023 FINDINGS: Gallbladder: Gallbladder sludge within the gallbladder lumen. Gallbladder wall thickening and pericholecystic fluid. No sonographic Murphy sign noted by sonographer. Common bile duct: Diameter: 3 mm Liver: No focal lesion identified. Within normal limits in parenchymal echogenicity. Portal vein is patent on color Doppler imaging with normal direction of blood flow towards the liver. Other: None. IMPRESSION: Gallbladder sludge with acute cholecystitis. Electronically Signed   By: Morgane  Naveau M.D.   On: 12/05/2023 21:33   CT ABDOMEN  PELVIS WO CONTRAST Result Date: 12/05/2023 CLINICAL DATA:  Abdominal pain. EXAM: CT ABDOMEN AND PELVIS WITHOUT CONTRAST TECHNIQUE: Multidetector CT imaging of the abdomen and pelvis was performed following the standard protocol without IV contrast. RADIATION DOSE REDUCTION: This exam was performed according to the departmental dose-optimization program which includes automated exposure control, adjustment of the mA and/or kV according to patient size and/or use of iterative reconstruction technique. COMPARISON:  07/21/2021. FINDINGS: Lower chest: Bibasilar consolidation may represent pneumonia or volume loss. Cardiomegaly. Atheromatous calcifications. Hepatobiliary: No focal abnormality identified in the liver without contrast. The gallbladder is distended. There is pericholecystic fluid and fat stranding. These findings suggest cholecystitis which can be assessed further with HIDA scan, if indicated. Pancreas: Unremarkable. No pancreatic ductal dilatation or surrounding inflammatory changes. Spleen: Normal in size without focal abnormality. Adrenals/Urinary Tract: No adrenal lesions. No hydronephrosis or nephrolithiasis. Right kidney 1.5 cm lesion consistent with a cyst with calcification. No ureteral stones. Urinary bladder was nearly empty with wall thickening which could be due to inflammation or hypertrophy. Stomach/Bowel: Loops of small bowel are dilated in the midabdomen without a discrete transition point to normal caliber bowel distally. This could represent a enteritis. Normal stool and air in the rectosigmoid. Vascular/Lymphatic: Aortic atherosclerosis. No enlarged abdominal or pelvic lymph nodes. Distal abdominal aortic aneurysm measuring 4.1 cm. Follow up with CTA or MRA in 12 months. Reproductive: Prostate is enlarged, 7.0 x 6.3 x 5.7 cm. Other: Left inguinal hernia noted that contains omental fat. No herniated bowel or evidence of incarceration. Musculoskeletal: Thoracolumbosacral degenerative  changes. IMPRESSION: 1. Findings suspicious for cholecystitis. 2. Bibasilar consolidation. 3. Enlarged prostate. 4. Dilated small bowel loops without a discrete transition point. This could represent a nonspecific enteritis. 5. Abdominal aortic aneurysm, 4.1 cm. Follow up with CTA or MRA in 12 months. 6. Left inguinal hernia containing omental fat. 7. Aortic atherosclerosis (ICD10-I70.0). Electronically Signed   By: Fonda Field M.D.   On: 12/05/2023 19:24     Assessment and Plan:   Pre-Op Evaluation Patient was admitted with acute cholecystitis. He has severe CAD with known CTO of RCA and LAD and has not been felt to be a candidate for revascularization. He also has a history of CHF, LV thrombus, and stroke. He seems stable from a cardiac standpoint. However, he would be high risk for any surgery. Revised Cardiac Risk Index, risk of major cardiac event perioperatively is 15%. It sounds like General Surgery is planning to tread medically though.   CAD Patient has known severe 3 vessel CAD with CTO of RCA and LAD noted on cardiac catheterization in 10/2021. PCI was not an option and CT surgery recommended medical therapy. - No chest pain.  - On Aspirin   at home but currently on hold in anticipation for possible surgery.  - Can restart statin when LFTs normalize.  Chronic HFmrEF Echo in 01/2023 showed LVEF 45-50% with apical akinesis and swirling artifact at the apex but no clear thrombus as well as mild dilatation of the aortic root and ascending aorta measuring 42mm and 39mm respectively. - He has faint crackles in bilateral bases and is currently requiring 3L of O2. However,  he does not describe any CHF symptoms and has no edema. He has had a wet sounding cough for 1.5 months. Chest x-ray shows today shows cardiomegaly but no acute findings. No edema. - Only takes Lasix  20mg  once a week at home. No need for diuresis right now. - Will restart home Toprol -XL 50mg  daily. - BP and renal function  will not allow for any other GDMT at this time.  History of LV Thrombus Noted on Echo in 07/2021 during admission for stroke. Last Echo in 01/2023 showed apical akinesis and swirling fact suggestive of low flow but no clear thrombus. He has been maintained on Coumadin  given apical akinesis. - Coumadin  currently on hold in anticipation for possible surgery. INR 2.3 today. However, per General Surgery notes it sounds like plan is for medical therapy. If that is the case, can restart Coumadin . Will defer dosing to Pharmacy.   Sinus Tachycardia Currently in sinus tachycardia with rates mostly in the 100s to 120s but increased to the 140s with minimal activity. Suspect this is due to underlying infection and possible dehydration from nausea/ vomiting/ diarrhea. - Will restart home beta-blocker.  - Continue treatment for acute cholecystitis.   Hypertension BP soft at times but overall stable.  - Continue Toprol -XL 50mg  daily. Will place hold parameters. - Continue to hold home Amlodipine .  Hyperlipidemia  - He is on Lipitor  80mg  daily at home. Currently on hold given elevated LFTs in setting of acute cholecystitis. Can restart when LFTs normalize.   AKI on CKD Stage IIIb Creatinine 2.25 on admission on now up to 2.94 today. Baseline is around 1.5 to 1.8. Likely prerenal in setting of nausea/ vomiting/ diarrhea.  - He was treated with gentle fluids per primary team. - Continue to avoid nephrotoxic agents. - Continue to monitor closely.  Hypokalemia Potassium 3.4. - Will replete.   Otherwise, per primary team: - Acute cholecystitis - Acute gastroenteritis  - Seizure disorder - AAA   Risk Assessment/Risk Scores:    [place]  Heart Association (NYHA) Functional Class NYHA Class II For questions or updates, please contact [facility] Please consult [email or web address] for contact info under  Signed, Callie E Goodrich, PA-C  12/06/2023 12:59 PM  Patient seen and examined, note  reviewed with the signed Advanced Practice Provider. I personally reviewed laboratory data, imaging studies and relevant notes. I independently examined the patient and formulated the important aspects of the plan. I have personally discussed the plan with the patient and/or family. Comments or changes to the note/plan are indicated below.  Pre-op Evaluation  CAD Chronic HFmrEF History of LV thrombus Hyperlipidemia ] Hypertension  AKI on CKD stage IIIb Electrolyte abnormality  He is not experiencing any anginal symptoms at this time will continue his current medication regimen.  Clinically euvolemic - no need for IV duiretics Please restart home beta blocker.  Coumadin  on hold for possible surgery - will need to be restarted post-op. If plan for medical management for Acute cholecystitis - ok to continue coumadin  - dosing per pharmacy team.  If need for invasive  or minimal invasive intervetion, ok to proceed with no need for further cardiac work up. He is a high risk patient for MACE during a intermediate risk or low risk procedure.   Birl Lobello DO, MS [facility] Attending Cardiologist [facility]  [address] [place], [place] [address] [phone] Website: [email or web address]

## 2023-12-06 NOTE — Progress Notes (Addendum)
 Patient ID: Roberto Dickson, male   DOB: 10-May-1941, 83 y.o.   MRN: [id number]      Subjective: No abdominal pain ROS negative except as listed above. Objective: Vital signs in last 24 hours: Temp:  [97.8 F (36.6 C)-100.9 F (38.3 C)] 97.8 F (36.6 C) (01/02 9367) Pulse Rate:  [43-117] 111 (01/02 0806) Resp:  [7-37] 26 (01/02 0806) BP: (95-146)/(56-112) 115/86 (01/02 0800) SpO2:  [85 %-94 %] 91 % (01/02 0806) Last BM Date : 12/06/23  Intake/Output from previous day: 01/01 0701 - 01/02 0700 In: 550 [IV Piggyback:550] Out: -  Intake/Output this shift: No intake/output data recorded.  General appearance: alert and cooperative Resp: clear to auscultation bilaterally GI: soft, no RUQ tenderness  Lab Results: CBC  Recent Labs    12/05/23 1449 12/06/23 0500  WBC 22.7* 18.3*  HGB 13.7 12.8*  HCT 42.5 38.6*  PLT 189 147*   BMET Recent Labs    12/05/23 1449 12/06/23 0500  NA 139 136  K 3.9 3.4*  CL 108 107  CO2 17* 19*  GLUCOSE 99 88  BUN 34* 44*  CREATININE 2.25* 2.94*  CALCIUM  9.0 8.3*   PT/INR Recent Labs    12/05/23 1842 12/06/23 0500  LABPROT 26.3* 25.4*  INR 2.4* 2.3*   ABG No results for input(s): PHART, HCO3 in the last 72 hours.  Invalid input(s): PCO2, PO2  Studies/Results: DG CHEST PORT 1 VIEW Result Date: 12/06/2023 CLINICAL DATA:  Respiratory failure, hypoxia EXAM: PORTABLE CHEST 1 VIEW COMPARISON:  01/09/2023 FINDINGS: Low volume AP portable examination. Cardiomegaly. No acute airspace opacity. No acute osseous findings. IMPRESSION: Low volume AP portable examination. Cardiomegaly without acute abnormality of the lungs. Electronically Signed   By: Marolyn JONETTA Jaksch M.D.   On: 12/06/2023 07:48   US  Abdomen Limited RUQ (LIVER/GB) Result Date: 12/05/2023 CLINICAL DATA:  151471 RUQ pain 151471 EXAM: ULTRASOUND ABDOMEN LIMITED RIGHT UPPER QUADRANT COMPARISON:  CT abdomen pelvis 12/05/2023 FINDINGS: Gallbladder: Gallbladder sludge within the  gallbladder lumen. Gallbladder wall thickening and pericholecystic fluid. No sonographic Murphy sign noted by sonographer. Common bile duct: Diameter: 3 mm Liver: No focal lesion identified. Within normal limits in parenchymal echogenicity. Portal vein is patent on color Doppler imaging with normal direction of blood flow towards the liver. Other: None. IMPRESSION: Gallbladder sludge with acute cholecystitis. Electronically Signed   By: Morgane  Naveau M.D.   On: 12/05/2023 21:33   CT ABDOMEN PELVIS WO CONTRAST Result Date: 12/05/2023 CLINICAL DATA:  Abdominal pain. EXAM: CT ABDOMEN AND PELVIS WITHOUT CONTRAST TECHNIQUE: Multidetector CT imaging of the abdomen and pelvis was performed following the standard protocol without IV contrast. RADIATION DOSE REDUCTION: This exam was performed according to the departmental dose-optimization program which includes automated exposure control, adjustment of the mA and/or kV according to patient size and/or use of iterative reconstruction technique. COMPARISON:  07/21/2021. FINDINGS: Lower chest: Bibasilar consolidation may represent pneumonia or volume loss. Cardiomegaly. Atheromatous calcifications. Hepatobiliary: No focal abnormality identified in the liver without contrast. The gallbladder is distended. There is pericholecystic fluid and fat stranding. These findings suggest cholecystitis which can be assessed further with HIDA scan, if indicated. Pancreas: Unremarkable. No pancreatic ductal dilatation or surrounding inflammatory changes. Spleen: Normal in size without focal abnormality. Adrenals/Urinary Tract: No adrenal lesions. No hydronephrosis or nephrolithiasis. Right kidney 1.5 cm lesion consistent with a cyst with calcification. No ureteral stones. Urinary bladder was nearly empty with wall thickening which could be due to inflammation or hypertrophy. Stomach/Bowel: Loops of small bowel are  dilated in the midabdomen without a discrete transition point to normal  caliber bowel distally. This could represent a enteritis. Normal stool and air in the rectosigmoid. Vascular/Lymphatic: Aortic atherosclerosis. No enlarged abdominal or pelvic lymph nodes. Distal abdominal aortic aneurysm measuring 4.1 cm. Follow up with CTA or MRA in 12 months. Reproductive: Prostate is enlarged, 7.0 x 6.3 x 5.7 cm. Other: Left inguinal hernia noted that contains omental fat. No herniated bowel or evidence of incarceration. Musculoskeletal: Thoracolumbosacral degenerative changes. IMPRESSION: 1. Findings suspicious for cholecystitis. 2. Bibasilar consolidation. 3. Enlarged prostate. 4. Dilated small bowel loops without a discrete transition point. This could represent a nonspecific enteritis. 5. Abdominal aortic aneurysm, 4.1 cm. Follow up with CTA or MRA in 12 months. 6. Left inguinal hernia containing omental fat. 7. Aortic atherosclerosis (ICD10-I70.0). Electronically Signed   By: Fonda Field M.D.   On: 12/05/2023 19:24    Anti-infectives: Anti-infectives (From admission, onward)    Start     Dose/Rate Route Frequency Ordered Stop   12/06/23 0500  piperacillin -tazobactam (ZOSYN ) IVPB 3.375 g        3.375 g 12.5 mL/hr over 240 Minutes Intravenous Every 8 hours 12/05/23 1951     12/05/23 2000  piperacillin -tazobactam (ZOSYN ) IVPB 3.375 g        3.375 g 100 mL/hr over 30 Minutes Intravenous  Once 12/05/23 1949 12/05/23 2053       Assessment/Plan:  Cholecystitis - WBC down a bit and bili down to 2, no pain and nontender so recommend medical treatment. IV Zosyn . Clears only today. We will follow.    LOS: 1 day    Dann Hummer, MD, MPH, FACS Trauma & General Surgery Use [email or web address] to contact on call provider  12/06/2023

## 2023-12-06 NOTE — Progress Notes (Addendum)
 PROGRESS NOTE    Roberto Dickson  FMW:[id number] DOB: Jul 20, 1941 DOA: 12/05/2023 PCP: Purcell Emil Schanz, MD  Subjective: Pt seen and examined. Used video interpretor [id number] No pain or SOB RN notes pt now on 10 L/min high flow O2 with sats in the high 80s. No chest pain Cards and general surgery reviewed. Pt lives at home with his wife and dtr.   Hospital Course: HPI: Roberto Dickson is a 83 y.o. 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   Patient had been exposed to a grandson with upper respiratory symptoms and the patient himself developed a mild cough and general malaise on 12/01/2023.  By 12/03/2023, patient was feeling much better but then had acute onset postprandial nausea and vomiting.  He complained of some abdominal pain at that time but none since.  He has since gone on to develop profuse diarrhea, fevers, and lethargy.  He is no longer vomiting.   ED Course: Upon arrival to the ED, patient is found to be febrile to 38.3 C and saturating low 90s on room air with normal RR, slightly elevated heart rate, and systolic blood pressure of 95 and greater.  Labs are most notable for creatinine 2.25, AST 158, ALT 123, total bilirubin 2.9, WBC 22,700, normal lipase, normal lactic acid, and INR 2.4.  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   Surgery was consulted by the ED physician and the patient was given a liter of NS and Zosyn .  Significant Events: Admitted 12/05/2023 for acute cholecystitis   Significant Labs: creatinine 2.25, AST 158, ALT 123, total bilirubin 2.9, WBC 22,700, normal lipase, normal lactic acid, and INR 2.4   Significant Imaging Studies: CT demonstrates gallbladder distention  with pericholecystic fluid and fat stranding, dilated small bowel loops without transition point, and 4.1 cm AAA.   Antibiotic Therapy: Anti-infectives (From admission, onward)    Start     Dose/Rate Route Frequency Ordered Stop   12/06/23 0500  piperacillin -tazobactam (ZOSYN ) IVPB 3.375 g        3.375 g 12.5 mL/hr over 240 Minutes Intravenous Every 8 hours 12/05/23 1951     12/05/23 2000  piperacillin -tazobactam (ZOSYN ) IVPB 3.375 g        3.375 g 100 mL/hr over 30 Minutes Intravenous  Once 12/05/23 1949 12/05/23 2053       Procedures:   Consultants: General surgery Cardiology    Assessment and Plan: * Acute cholecystitis 12-06-2023 general surgery consulted. No plans for surgery at this time. Continue with IV ABX with Zosyn . Clear liquids. Cardiology consulted per surgery request for pre-op clearance. Discussed with general surgery. Continue to hold coumadin  for now. Surgery will re-assess in AM and give clearance to restart or not tomorrow.  Severe sepsis (HCC) 12/06/2023 present on admission. Acute cholecystitis on RUQ U/S, CT abd. WBC 22.7, HR 111, fever 100.9  Acute respiratory failure with hypoxia (HCC) 12/06/2023 pt not on home O2. INR therapeutic 2.3.  echo 01-2023 shows interval resolution of prior ventricular thrombus. Will check CT chest without contrast due to AKI/CKD.   Elevated LFTs 12-06-2023. Likely due to acute cholecystitis. Monitor LFTs.  Acute renal failure superimposed on stage 3b chronic kidney disease (HCC) 12-06-2023 baseline scr 1.55-1.75. continue with IVF. Monitor for volume overload. Repeat CMP in AM. Avoid IV contrast  and nephrotoxic agents.  HFrEF (heart failure with reduced ejection fraction) (HCC) 12-06-2023. On Toprol -xl. Remains on IVF. Monitor for fluid overload.  History of stroke 12-06-2023 stable.  Coronary artery disease of native artery of native heart with stable angina pectoris (HCC) 12-06-2023 stable.  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 On coumadin . Thrombus resolved as of echo 01-2023  Hypokalemia Replete with po kcl   DVT prophylaxis:   coumadin    Code Status: Full Code Family Communication: no family at bedside Disposition Plan: return home Reason for continuing need for hospitalization: remains on IV ABX, general surgery consulting.  Objective: Vitals:   12/06/23 1434 12/06/23 1600 12/06/23 1623 12/06/23 1625  BP: 102/60 113/69    Pulse: (!) 109 (!) 114 (!) 116 (!) 121  Resp: (!) 33 (!) 34 20 20  Temp:      TempSrc:      SpO2: 92% (!) 85% (!) 88% 90%  Weight:      Height:        Intake/Output Summary (Last 24 hours) at 12/06/2023 1646 Last data filed at 12/06/2023 1537 Gross per 24 hour  Intake 1381.79 ml  Output --  Net 1381.79 ml   Filed Weights   12/06/23 1300  Weight: 78.9 kg    Examination:  Physical Exam Vitals and nursing note reviewed.  Constitutional:      General: He is not in acute distress.    Appearance: He is ill-appearing. He is not toxic-appearing.     Comments: Chronically ill appearing  HENT:     Head: Normocephalic and atraumatic.     Nose: Nose normal.  Eyes:     General: No scleral icterus. Cardiovascular:     Rate and Rhythm: Regular rhythm. Tachycardia present. Extrasystoles are present. Pulmonary:     Effort: Pulmonary effort is normal.     Breath sounds: Normal breath sounds.     Data Reviewed: I have personally reviewed following labs and imaging studies  CBC: Recent Labs  Lab 12/05/23 1449 12/06/23 0500  WBC 22.7* 18.3*  HGB 13.7 12.8*  HCT 42.5 38.6*  MCV 95.9 94.4  PLT 189 147*   Basic Metabolic Panel: Recent Labs  Lab 12/05/23 1449 12/06/23 0500  NA 139 136  K 3.9 3.4*  CL 108 107  CO2 17* 19*  GLUCOSE 99 88  BUN 34* 44*  CREATININE 2.25* 2.94*  CALCIUM  9.0 8.3*   GFR: Estimated Creatinine Clearance: 18.7 mL/min (A) (by C-G formula based on SCr of 2.94 mg/dL (H)). Liver Function Tests: Recent Labs  Lab  12/05/23 1449 12/06/23 0500  AST 158* 193*  ALT 123* 96*  ALKPHOS 82 74  BILITOT 2.9* 2.0*  PROT 6.8 5.7*  ALBUMIN  3.3* 2.9*   Recent Labs  Lab 12/05/23 1449  LIPASE 22   Coagulation Profile: Recent Labs  Lab 12/04/23 1555 12/05/23 1842 12/06/23 0500  INR 2.8 2.4* 2.3*   BNP (last 3 results) Recent Labs    01/09/23 1010  BNP 195.7*   Sepsis Labs: Recent Labs  Lab 12/05/23 1850  LATICACIDVEN 1.7    Recent Results (from the past 240 hours)  Blood culture (routine x 2)     Status: None (Preliminary result)   Collection Time: 12/05/23  6:38 PM   Specimen: BLOOD RIGHT HAND  Result Value Ref Range Status   Specimen Description BLOOD RIGHT HAND  Final   Special Requests   Final    BOTTLES DRAWN AEROBIC AND ANAEROBIC Blood Culture results may not be optimal due  to an inadequate volume of blood received in culture bottles   Culture   Final    NO GROWTH < 12 HOURS Performed at [facility], [address], [place], [place] [address]    Report Status PENDING  Incomplete  Resp panel by RT-PCR (RSV, Flu A&B, Covid) Anterior Nasal Swab     Status: None   Collection Time: 12/05/23  6:41 PM   Specimen: Anterior Nasal Swab  Result Value Ref Range Status   SARS Coronavirus 2 by RT PCR NEGATIVE NEGATIVE Final   Influenza A by PCR NEGATIVE NEGATIVE Final   Influenza B by PCR NEGATIVE NEGATIVE Final    Comment: (NOTE) The Xpert Xpress SARS-CoV-2/FLU/RSV plus assay is intended as an aid in the diagnosis of influenza from Nasopharyngeal swab specimens and should not be used as a sole basis for treatment. Nasal washings and aspirates are unacceptable for Xpert Xpress SARS-CoV-2/FLU/RSV testing.  Fact Sheet for Patients: bloggercourse.com  Fact Sheet for Healthcare Providers: seriousbroker.it  This test is not yet approved or cleared by the [place]  FDA and has been authorized for detection and/or diagnosis of  SARS-CoV-2 by FDA under an Emergency Use Authorization (EUA). This EUA will remain in effect (meaning this test can be used) for the duration of the COVID-19 declaration under Section 564(b)(1) of the Act, 21 U.S.C. section 360bbb-3(b)(1), unless the authorization is terminated or revoked.     Resp Syncytial Virus by PCR NEGATIVE NEGATIVE Final    Comment: (NOTE) Fact Sheet for Patients: bloggercourse.com  Fact Sheet for Healthcare Providers: seriousbroker.it  This test is not yet approved or cleared by the [place]  FDA and has been authorized for detection and/or diagnosis of SARS-CoV-2 by FDA under an Emergency Use Authorization (EUA). This EUA will remain in effect (meaning this test can be used) for the duration of the COVID-19 declaration under Section 564(b)(1) of the Act, 21 U.S.C. section 360bbb-3(b)(1), unless the authorization is terminated or revoked.  Performed at [facility], [address] [place], [place] [address]   Blood culture (routine x 2)     Status: None (Preliminary result)   Collection Time: 12/05/23  6:42 PM   Specimen: BLOOD LEFT HAND  Result Value Ref Range Status   Specimen Description BLOOD LEFT HAND  Final   Special Requests   Final    BOTTLES DRAWN AEROBIC AND ANAEROBIC Blood Culture results may not be optimal due to an inadequate volume of blood received in culture bottles   Culture   Final    NO GROWTH < 12 HOURS Performed at [facility], [address] [place], [place] [address]    Report Status PENDING  Incomplete  Culture, blood (x 2)     Status: None (Preliminary result)   Collection Time: 12/05/23 10:13 PM   Specimen: BLOOD  Result Value Ref Range Status   Specimen Description BLOOD LEFT ANTECUBITAL  Final   Special Requests   Final    BOTTLES DRAWN AEROBIC AND ANAEROBIC Blood Culture adequate volume   Culture   Final    NO GROWTH < 12 HOURS Performed at [facility], [address] [place], [place] [address]    Report Status PENDING  Incomplete     Radiology Studies: DG CHEST PORT 1 VIEW Result Date: 12/06/2023 CLINICAL DATA:  Respiratory failure, hypoxia EXAM: PORTABLE CHEST 1 VIEW COMPARISON:  01/09/2023 FINDINGS: Low volume AP portable examination. Cardiomegaly. No acute airspace opacity. No acute osseous  findings. IMPRESSION: Low volume AP portable examination. Cardiomegaly without acute abnormality of the lungs. Electronically Signed   By: Marolyn JONETTA Jaksch M.D.   On: 12/06/2023 07:48   US  Abdomen Limited RUQ (LIVER/GB) Result Date: 12/05/2023 CLINICAL DATA:  151471 RUQ pain 151471 EXAM: ULTRASOUND ABDOMEN LIMITED RIGHT UPPER QUADRANT COMPARISON:  CT abdomen pelvis 12/05/2023 FINDINGS: Gallbladder: Gallbladder sludge within the gallbladder lumen. Gallbladder wall thickening and pericholecystic fluid. No sonographic Murphy sign noted by sonographer. Common bile duct: Diameter: 3 mm Liver: No focal lesion identified. Within normal limits in parenchymal echogenicity. Portal vein is patent on color Doppler imaging with normal direction of blood flow towards the liver. Other: None. IMPRESSION: Gallbladder sludge with acute cholecystitis. Electronically Signed   By: Morgane  Naveau M.D.   On: 12/05/2023 21:33   CT ABDOMEN PELVIS WO CONTRAST Result Date: 12/05/2023 CLINICAL DATA:  Abdominal pain. EXAM: CT ABDOMEN AND PELVIS WITHOUT CONTRAST TECHNIQUE: Multidetector CT imaging of the abdomen and pelvis was performed following the standard protocol without IV contrast. RADIATION DOSE REDUCTION: This exam was performed according to the departmental dose-optimization program which includes automated exposure control, adjustment of the mA and/or kV according to patient size and/or use of iterative reconstruction technique. COMPARISON:  07/21/2021. FINDINGS: Lower chest: Bibasilar consolidation may represent pneumonia or volume loss. Cardiomegaly. Atheromatous  calcifications. Hepatobiliary: No focal abnormality identified in the liver without contrast. The gallbladder is distended. There is pericholecystic fluid and fat stranding. These findings suggest cholecystitis which can be assessed further with HIDA scan, if indicated. Pancreas: Unremarkable. No pancreatic ductal dilatation or surrounding inflammatory changes. Spleen: Normal in size without focal abnormality. Adrenals/Urinary Tract: No adrenal lesions. No hydronephrosis or nephrolithiasis. Right kidney 1.5 cm lesion consistent with a cyst with calcification. No ureteral stones. Urinary bladder was nearly empty with wall thickening which could be due to inflammation or hypertrophy. Stomach/Bowel: Loops of small bowel are dilated in the midabdomen without a discrete transition point to normal caliber bowel distally. This could represent a enteritis. Normal stool and air in the rectosigmoid. Vascular/Lymphatic: Aortic atherosclerosis. No enlarged abdominal or pelvic lymph nodes. Distal abdominal aortic aneurysm measuring 4.1 cm. Follow up with CTA or MRA in 12 months. Reproductive: Prostate is enlarged, 7.0 x 6.3 x 5.7 cm. Other: Left inguinal hernia noted that contains omental fat. No herniated bowel or evidence of incarceration. Musculoskeletal: Thoracolumbosacral degenerative changes. IMPRESSION: 1. Findings suspicious for cholecystitis. 2. Bibasilar consolidation. 3. Enlarged prostate. 4. Dilated small bowel loops without a discrete transition point. This could represent a nonspecific enteritis. 5. Abdominal aortic aneurysm, 4.1 cm. Follow up with CTA or MRA in 12 months. 6. Left inguinal hernia containing omental fat. 7. Aortic atherosclerosis (ICD10-I70.0). Electronically Signed   By: Fonda Field M.D.   On: 12/05/2023 19:24    Scheduled Meds:  levETIRAcetam   500 mg Oral BID   metoprolol  succinate  50 mg Oral Daily   sodium chloride  flush  3 mL Intravenous Q12H   Continuous Infusions:  lactated  ringers  Stopped (12/06/23 1425)   [START ON 12/07/2023] piperacillin -tazobactam       LOS: 1 day   Time spent: 40 minutes  Camellia Door, DO  [facility]  12/06/2023, 4:46 PM

## 2023-12-06 NOTE — Assessment & Plan Note (Signed)
 12-06-2023. Likely due to acute cholecystitis. Monitor LFTs.

## 2023-12-06 NOTE — Telephone Encounter (Signed)
 He needs to be seen in the emergency department first.  May need to be admitted to the hospital.  Further instructions will be given there.  Thanks.

## 2023-12-06 NOTE — Assessment & Plan Note (Signed)
 12-06-2023. On Toprol-xl. Remains on IVF. Monitor for fluid overload.

## 2023-12-06 NOTE — Plan of Care (Signed)
  Problem: Fluid Volume: Goal: Hemodynamic stability will improve Outcome: Progressing   Problem: Clinical Measurements: Goal: Diagnostic test results will improve Outcome: Progressing Goal: Signs and symptoms of infection will decrease Outcome: Progressing   Problem: Education: Goal: Knowledge of General Education information will improve Description: Including pain rating scale, medication(s)/side effects and non-pharmacologic comfort measures Outcome: Progressing   Problem: Health Behavior/Discharge Planning: Goal: Ability to manage health-related needs will improve Outcome: Progressing   Problem: Clinical Measurements: Goal: Ability to maintain clinical measurements within normal limits will improve Outcome: Progressing Goal: Will remain free from infection Outcome: Progressing Goal: Diagnostic test results will improve Outcome: Progressing Goal: Respiratory complications will improve Outcome: Progressing Goal: Cardiovascular complication will be avoided Outcome: Progressing   Problem: Activity: Goal: Risk for activity intolerance will decrease Outcome: Progressing   Problem: Nutrition: Goal: Adequate nutrition will be maintained Outcome: Progressing   Problem: Coping: Goal: Level of anxiety will decrease Outcome: Progressing   Problem: Elimination: Goal: Will not experience complications related to bowel motility Outcome: Progressing Goal: Will not experience complications related to urinary retention Outcome: Progressing   Problem: Pain Management: Goal: General experience of comfort will improve Outcome: Progressing   Problem: Safety: Goal: Ability to remain free from injury will improve Outcome: Progressing   Problem: Skin Integrity: Goal: Risk for impaired skin integrity will decrease Outcome: Progressing

## 2023-12-06 NOTE — Telephone Encounter (Signed)
 They will need home health care assistance. We can start referral today or maybe it can be started at the Hospital prior to discharge.

## 2023-12-06 NOTE — Assessment & Plan Note (Signed)
 12-06-2023. On coumadin. Thrombus resolved as of echo 01-2023

## 2023-12-07 DIAGNOSIS — Z7189 Other specified counseling: Secondary | ICD-10-CM

## 2023-12-07 DIAGNOSIS — K819 Cholecystitis, unspecified: Secondary | ICD-10-CM

## 2023-12-07 DIAGNOSIS — Z789 Other specified health status: Secondary | ICD-10-CM

## 2023-12-07 DIAGNOSIS — K810 Acute cholecystitis: Secondary | ICD-10-CM | POA: Diagnosis not present

## 2023-12-07 DIAGNOSIS — Z515 Encounter for palliative care: Secondary | ICD-10-CM | POA: Diagnosis not present

## 2023-12-07 DIAGNOSIS — A419 Sepsis, unspecified organism: Secondary | ICD-10-CM | POA: Diagnosis not present

## 2023-12-07 DIAGNOSIS — Z66 Do not resuscitate: Secondary | ICD-10-CM

## 2023-12-07 MED ORDER — GLYCOPYRROLATE 0.2 MG/ML IJ SOLN
0.2000 mg | INTRAMUSCULAR | Status: DC | PRN
  Administered 2023-12-08: 0.2 mg via INTRAVENOUS
  Filled 2023-12-07: qty 1

## 2023-12-07 MED ORDER — LORAZEPAM 2 MG/ML IJ SOLN
1.0000 mg | INTRAMUSCULAR | Status: DC | PRN
  Administered 2023-12-08 – 2023-12-09 (×3): 1 mg via INTRAVENOUS
  Filled 2023-12-07 (×3): qty 1

## 2023-12-07 NOTE — Progress Notes (Signed)
 Patient currently transitioned to comfort care.  Cardiology will sign off at this time.

## 2023-12-07 NOTE — Consult Note (Addendum)
 Consultation Note Date: 12/07/2023   Patient Name: Roberto Dickson  DOB: [birth year]  MRN: [id number]  Age / Sex: 83 y.o., male  PCP: Purcell Emil Schanz, MD Referring Physician: Dennise Lavada POUR, MD  Reason for Consultation: Establishing goals of care, Goals of care, family wants comfort measures, ? Hospice   HPI/Patient Profile: 83 y.o. 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 Patient's hospital course was complicated SIRS. After discussions with CCM, family opted for patient's transition to full comfort measures.   Clinical Assessment and Goals of Care: I have reviewed medical records including EPIC notes, labs, any available advanced directives, and imaging. Unable to receive report from primary RN.   Went to visit patient at bedside - no family/visitors present. Patient was lying in bed asleep - I did not attempt to wake him to preserve comfort. No signs or non-verbal gestures of pain or discomfort noted. No respiratory distress, increased work of breathing, or secretions noted. HHFNC in use.  Called patient's daughter/Maria who was with patient's wife - she speaks English and did not need use of interpreter. Emotional support provided. She confirms goals for comfort measures in house.  We discussed option of hospice transfer - family prefer patient remain in house for EOL care. Residential hospice facility would not be able to meet patient's current oxygen needs. If we were to wean oxygen, patient would likely not remain stable for transfer. Plan to leave oxygen for now at current levels without escalating based on saturations. If patient lingerers over the coming days, can wean oxygen if family agreeable. At this  time, family are coming to visit with him.  Questions and concerns were addressed. The patient/family was encouraged to call with questions and/or concerns. PMT card left at bedside.  Verified with [facility] hospice liaison they could not support patient on current levels of HHFNC.  Primary Decision Maker: NEXT OF KIN     SUMMARY OF RECOMMENDATIONS   Continue full comfort measures - anticipate hospital death Continue DNR/DNI as previously documented - durable DNR form located in shadow chart. Copy was made and will be scanned into Vynca/ACP tab Residential hospice facility cannot meet patient's current oxygen needs. When/if oxygen is weaned, patient would likely not remain stable for transfer. If patient lingerers over the next day or two, after family visits, can wean oxygen if family agreeable Family prefer patient remain in house for EOL care Added orders for EOL symptom management and to reflect full comfort measures, as well as discontinued orders that were not focused on comfort Unrestricted visitation orders were placed per current [facility] EOL visitation policy  Nursing to provide frequent assessments and administer PRN medications as clinically necessary to ensure EOL comfort PMT will continue to follow and support holistically  Symptom Management Continuous morphine  infusion; bolus doses for breakthrough pain/dyspnea/increased work of breathing/RR>25 Tylenol  PRN pain/fever Robinul  PRN secretions Haldol  PRN agitation/delirium Ativan  PRN anxiety/seizure/sleep/distress Zofran  PRN nausea/vomiting    Code Status/Advance Care Planning: DNR  Palliative Prophylaxis:  Aspiration, Delirium Protocol, Frequent Pain Assessment, Oral Care, and Turn Reposition  Additional Recommendations (Limitations, Scope, Preferences): Full Comfort Care  Psycho-social/Spiritual:  Desire for further Chaplaincy support:no Created space and opportunity for patient and family to express thoughts and  feelings regarding patient's current medical situation.  Emotional support and therapeutic listening provided.  Prognosis:  Days  Discharge Planning:  Anticipated Hospital Death      Primary Diagnoses: Present on Admission:  Acute cholecystitis  Acute renal failure superimposed on stage 3b chronic kidney disease (HCC)  Elevated LFTs  Left ventricular thrombus  Coronary artery disease of native artery of native heart with stable angina pectoris (HCC)  HFrEF (heart failure with reduced ejection fraction) (HCC)  Severe sepsis (HCC)  AAA (abdominal aortic aneurysm) (HCC)  Acute respiratory failure with hypoxia (HCC)   I have reviewed the medical record, interviewed the patient and family, and examined the patient. The following aspects are pertinent.  Past Medical History:  Diagnosis Date   Bilateral hip pain 01/25/2023   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   Coronary artery disease of native artery of native heart with stable angina pectoris (HCC)    HFrEF (heart failure with reduced ejection fraction) (HCC) 05/03/2023   History of seizure 08/24/2022   History of stroke 11/09/2021   Hyperlipidemia 10/21/2021   Hypertension    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   Sleep disturbance    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/12/2021   Social History   Socioeconomic History   Marital status: Married    Spouse name: Not on file   Number of children: Not on file   Years of education: Not on file   Highest education level: Not on file  Occupational History   Not on file  Tobacco Use   Smoking status: Never   Smokeless tobacco: Never  Vaping Use   Vaping status: Never Used  Substance and Sexual Activity   Alcohol use: Not Currently    Alcohol/week: 1.0 standard drink of alcohol    Types: 1 Cans of beer per week   Drug use: Never   Sexual activity: Not on file  Other Topics Concern   Not on file  Social History Narrative   Not on file   Social Drivers of Health    Financial Resource Strain: Low Risk  (11/23/2022)   Overall Financial Resource Strain (CARDIA)    Difficulty of Paying Living Expenses: Not hard at all  Food Insecurity: No Food Insecurity (12/06/2023)   Hunger Vital Sign    Worried About Running Out of Food in the Last Year: Never true    Ran Out of Food in the Last Year: Never true  Transportation Needs: Patient Unable To Answer (12/06/2023)   PRAPARE - Transportation    Lack of Transportation (Medical): Patient unable to answer    Lack of Transportation (Non-Medical): Patient unable to answer  Physical Activity: Sufficiently Active (11/23/2022)   Exercise Vital Sign    Days of Exercise per Week: 5 days    Minutes of Exercise per Session: 30 min  Stress: No Stress Concern Present (11/23/2022)   [employer] of Occupational Health - Occupational Stress Questionnaire    Feeling of Stress : Not at all  Social Connections: Unknown (12/06/2023)   Social Connection and Isolation Panel [NHANES]    Frequency of Communication with Friends and Family: Patient unable to answer    Frequency of Social Gatherings with Friends and Family: Patient unable to answer    Attends Religious Services: Patient unable to answer    Active Member of Clubs or Organizations: Patient unable to answer    Attends Banker Meetings: Patient unable to answer    Marital Status: Married   Family History  Problem Relation Age of Onset   Cancer - Prostate Father    Scheduled Meds:  LORazepam   1 mg Intravenous Q4H  Continuous Infusions:  morphine  5 mg/hr (12/07/23 1104)   PRN Meds:.acetaminophen  **OR** acetaminophen , haloperidol  lactate, morphine , [DISCONTINUED] ondansetron  **OR** ondansetron  (ZOFRAN ) IV Medications Prior to Admission:  Prior to Admission medications   Medication Sig Start Date End Date Taking? Authorizing Provider  acetaminophen  (TYLENOL ) 325 MG tablet Take 1-2 tablets (325-650 mg total) by mouth every 4 (four) hours as needed  for mild pain. Patient taking differently: Take 325-650 mg by mouth daily as needed for mild pain (pain score 1-3). 08/10/21  Yes Love, Sharlet RAMAN, PA-C  amLODipine  (NORVASC ) 5 MG tablet TAKE 1 TABLET(5 MG) BY MOUTH DAILY 02/16/23  Yes Sagardia, Emil Schanz, MD  aspirin  EC (ASPIRIN  LOW DOSE) 81 MG tablet Take 1 tablet (81 mg total) by mouth daily. Swallow whole. 10/19/22  Yes Lonni Slain, MD  docusate sodium  (COLACE) 100 MG capsule Take 1 capsule (100 mg total) by mouth 2 (two) times daily. Patient taking differently: Take 200 mg by mouth daily. 08/10/21  Yes Love, Sharlet RAMAN, PA-C  furosemide  (LASIX ) 20 MG tablet TAKE 1 TABLET( 20 MG TOTAL) BY MOUTH ONCE A WEEK. 10/26/23  Yes Walker, Caitlin S, NP  levETIRAcetam  (KEPPRA ) 500 MG tablet Take 1 tablet (500 mg total) by mouth 2 (two) times daily. 05/08/23 05/02/24 Yes Sagardia, Emil Schanz, MD  melatonin 3 MG TABS tablet Take 0.5 tablets (1.5 mg total) by mouth at bedtime. OVER THE COUNTER Patient taking differently: Take 1.5 mg by mouth at bedtime. 08/10/21  Yes Love, Sharlet RAMAN, PA-C  metoprolol  succinate (TOPROL -XL) 50 MG 24 hr tablet TAKE 1 TABLET(50 MG) BY MOUTH AT BEDTIME 11/07/23  Yes Lonni Slain, MD  pantoprazole  (PROTONIX ) 40 MG tablet TAKE 1 TABLET(40 MG) BY MOUTH AT BEDTIME 08/12/23  Yes Sagardia, Emil Schanz, MD  tamsulosin  (FLOMAX ) 0.4 MG CAPS capsule TAKE 1 CAPSULE BY MOUTH DAILY 01/18/23  Yes Sagardia, Emil Schanz, MD  tiZANidine  (ZANAFLEX ) 2 MG tablet TAKE 1 TABLET BY MOUTH AT BEDTIME AS NEEDED FOR MUSCLE SPASMS 09/30/23  Yes Sagardia, Miguel Jose, MD  warfarin (COUMADIN ) 6 MG tablet TAKE 1 TABLET BY MOUTH DAILY AS DIRECTED BY COAGULATION CLINIC Patient taking differently: Take 3-6 mg by mouth See admin instructions. Take 1/2 tablet by mouth every Sun, Tues, Fri, then take 1 tablet all other days or AS DIRECTED BY COAGULATION CLINIC 06/18/23  Yes Lonni Slain, MD  atorvastatin  (LIPITOR ) 80 MG tablet TAKE 1 TABLET(80 MG) BY  MOUTH DAILY 03/03/23   Purcell Emil Schanz, MD  ciclopirox  (PENLAC ) 8 % solution Apply topically at bedtime. Apply over nail and surrounding skin. Apply daily over previous coat. After seven (7) days, may remove with alcohol and continue cycle. 11/12/23   Gershon Donnice SAUNDERS, DPM   No Known Allergies Review of Systems  Unable to perform ROS: Acuity of condition    Physical Exam Vitals and nursing note reviewed.  Constitutional:      General: He is not in acute distress.    Appearance: He is ill-appearing.  Pulmonary:     Effort: No respiratory distress.     Comments: HHFNC Skin:    General: Skin is warm and dry.  Neurological:     Mental Status: He is unresponsive.     Motor: Weakness present.     Vital Signs: BP (!) 97/59 (BP Location: Right Arm)   Pulse (!) 107   Temp 98.6 F (37 C) (Oral)   Resp 18   Ht 5' 7 (1.702 m)   Wt 78.9 kg   SpO2 92%  BMI 27.25 kg/m  Pain Scale: 0-10   Pain Score: 0-No pain (pt told family this)   SpO2: SpO2: 92 % O2 Device:SpO2: 92 % O2 Flow Rate: .O2 Flow Rate (L/min): 25 L/min  IO: Intake/output summary:  Intake/Output Summary (Last 24 hours) at 12/07/2023 1330 Last data filed at 12/07/2023 0419 Gross per 24 hour  Intake 879.68 ml  Output 1 ml  Net 878.68 ml    LBM: Last BM Date : 12/06/23 Baseline Weight: Weight: 78.9 kg Most recent weight: Weight: 78.9 kg     Palliative Assessment/Data: PPS 10%     Time In: 1330 Time Out: 1500 Time Total: 90 minutes  Signed by: Jeoffrey CHRISTELLA Sharps, NP   Please contact Palliative Medicine Team phone at [phone] for questions and concerns.  For individual provider: See Amion  *Portions of this note are a verbal dictation therefore any spelling and/or grammatical errors are due to the Dragon Medical One system interpretation.

## 2023-12-07 NOTE — TOC CM/SW Note (Signed)
 Transition of Care [facility]) - Inpatient Brief Assessment   Patient Details  Name: Roberto Dickson MRN: [id number] Date of Birth: [birth year]  Transition of Care [facility]) CM/SW Contact:    Inocente GORMAN Kindle, LCSW Phone Number: 12/07/2023, 12:01 PM   Clinical Narrative: Patient admitted from home and has transitioned to comfort care. CSW following for any needs post-palliative consult. DNR signed on hard chart.    Transition of Care Asessment: Insurance and Status: Insurance coverage has been reviewed Patient has primary care physician: Yes Home environment has been reviewed: From home   Prior/Current Home Services: No current home services Social Drivers of Health Review: SDOH reviewed no interventions necessary Readmission risk has been reviewed: Yes Transition of care needs: transition of care needs identified, TOC will continue to follow

## 2023-12-07 NOTE — Progress Notes (Signed)
 Noted transition to comfort care without percutaneous cholecystostomy tube.  Surgery will sign off. Call as needed.    Hosie Spangle, PA-C [facility] Please see Amion for pager number during day hours 7:00am-4:30pm

## 2023-12-07 NOTE — Care Management Important Message (Signed)
 Important Message  Patient Details  Name: Roberto Dickson MRN: [id number] Date of Birth: February 05, 1941   Important Message Given:  Yes - Medicare IM     Dorena Bodo 12/07/2023, 4:02 PM

## 2023-12-07 NOTE — Progress Notes (Signed)
 Roberto Dickson, is a 83 y.o. male, DOB - 05-30-1941, FMW:[id number]  83 y.o. 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   Patient had been exposed to a grandson with upper respiratory symptoms and the patient himself developed a mild cough and general malaise on 12/01/2023.  By 12/03/2023, patient was feeling much better but then had acute onset postprandial nausea and vomiting.  He complained of some abdominal pain at that time but none since.    Further workup was consistent with cute cholecystitis with sepsis, he also had acute hypoxic respiratory failure, AKI with underlying history of CAD and left ventricular thrombus.  He was seen by the hospitalist service, general surgery, cardiology and PCCM.  After discussions with family he was transition to full comfort care on 12/06/2023 and transferred to my service on 12/07/2023.  Currently he is under full comfort care, will consult Perative care to see if he is appropriate for hospice.  All non comfort medications have been stopped.    Vitals:   12/06/23 2032 12/06/23 2122 12/07/23 0208 12/07/23 0803  BP:  (!) 97/59    Pulse: (!) 140 86 (!) 107   Resp: (!) 29 (!) 27 18 18   Temp:      TempSrc:      SpO2: 92% 93% 92%   Weight:      Height:       Physical exam.  Elderly Hispanic male lying in hospital bed in no distress but currently unresponsive, CTAB Rapid heart rate no gallops rubs or murmurs Abdomen soft No edema     Data Review   Micro Results Recent Results (from the past 240 hours)  Blood culture (routine x 2)     Status: None (Preliminary result)   Collection Time: 12/05/23  6:38 PM   Specimen: BLOOD RIGHT HAND  Result Value Ref Range Status   Specimen Description BLOOD RIGHT  HAND  Final   Special Requests   Final    BOTTLES DRAWN AEROBIC AND ANAEROBIC Blood Culture results may not be optimal due to an inadequate volume of blood received in culture bottles   Culture   Final    NO GROWTH 2 DAYS Performed at [facility], [address] [place], [place] [address]    Report Status PENDING  Incomplete  Resp panel by RT-PCR (RSV, Flu A&B, Covid) Anterior Nasal Swab     Status: None   Collection Time: 12/05/23  6:41 PM   Specimen: Anterior Nasal Swab  Result Value Ref Range Status   SARS Coronavirus 2 by RT PCR NEGATIVE NEGATIVE Final   Influenza A by PCR NEGATIVE NEGATIVE Final   Influenza B by PCR NEGATIVE NEGATIVE Final    Comment: (NOTE) The Xpert Xpress SARS-CoV-2/FLU/RSV plus assay is intended as an aid in the diagnosis of influenza from Nasopharyngeal swab specimens and should not be used as a sole basis for treatment. Nasal washings and aspirates are unacceptable for Xpert Xpress SARS-CoV-2/FLU/RSV testing.  Fact Sheet for Patients: bloggercourse.com  Fact Sheet for Healthcare Providers: seriousbroker.it  This test is not yet approved or cleared by the [place]  FDA and has been authorized for detection and/or diagnosis of SARS-CoV-2 by FDA under an Emergency Use Authorization (EUA). This EUA will remain in effect (meaning this test can be used) for the duration of the COVID-19 declaration under Section 564(b)(1) of the  Act, 21 U.S.C. section 360bbb-3(b)(1), unless the authorization is terminated or revoked.     Resp Syncytial Virus by PCR NEGATIVE NEGATIVE Final    Comment: (NOTE) Fact Sheet for Patients: bloggercourse.com  Fact Sheet for Healthcare Providers: seriousbroker.it  This test is not yet approved or cleared by the [place]  FDA and has been authorized for detection and/or diagnosis of SARS-CoV-2 by FDA under an  Emergency Use Authorization (EUA). This EUA will remain in effect (meaning this test can be used) for the duration of the COVID-19 declaration under Section 564(b)(1) of the Act, 21 U.S.C. section 360bbb-3(b)(1), unless the authorization is terminated or revoked.  Performed at [facility], [address] [place], [place] [address]   Blood culture (routine x 2)     Status: None (Preliminary result)   Collection Time: 12/05/23  6:42 PM   Specimen: BLOOD LEFT HAND  Result Value Ref Range Status   Specimen Description BLOOD LEFT HAND  Final   Special Requests   Final    BOTTLES DRAWN AEROBIC AND ANAEROBIC Blood Culture results may not be optimal due to an inadequate volume of blood received in culture bottles   Culture   Final    NO GROWTH 2 DAYS Performed at [facility], [address], [place], [place] [address]    Report Status PENDING  Incomplete  Culture, blood (x 2)     Status: None (Preliminary result)   Collection Time: 12/05/23 10:13 PM   Specimen: BLOOD  Result Value Ref Range Status   Specimen Description BLOOD LEFT ANTECUBITAL  Final   Special Requests   Final    BOTTLES DRAWN AEROBIC AND ANAEROBIC Blood Culture adequate volume   Culture   Final    NO GROWTH 2 DAYS Performed at [facility], [address], [place], [place] [address]    Report Status PENDING  Incomplete  Culture, blood (x 2)     Status: None (Preliminary result)   Collection Time: 12/06/23  3:59 PM   Specimen: BLOOD LEFT ARM  Result Value Ref Range Status   Specimen Description BLOOD LEFT ARM  Final   Special Requests   Final    BOTTLES DRAWN AEROBIC ONLY Blood Culture results may not be optimal due to an inadequate volume of blood received in culture bottles   Culture   Final    NO GROWTH < 24 HOURS Performed at [facility], [address] [place], [place] [address]    Report Status PENDING  Incomplete    Radiology Reports DG CHEST PORT 1 VIEW Result Date:  12/06/2023 CLINICAL DATA:  Respiratory failure, hypoxia EXAM: PORTABLE CHEST 1 VIEW COMPARISON:  01/09/2023 FINDINGS: Low volume AP portable examination. Cardiomegaly. No acute airspace opacity. No acute osseous findings. IMPRESSION: Low volume AP portable examination. Cardiomegaly without acute abnormality of the lungs. Electronically Signed   By: Marolyn JONETTA Jaksch M.D.   On: 12/06/2023 07:48   US  Abdomen Limited RUQ (LIVER/GB) Result Date: 12/05/2023 CLINICAL DATA:  151471 RUQ pain 151471 EXAM: ULTRASOUND ABDOMEN LIMITED RIGHT UPPER QUADRANT COMPARISON:  CT abdomen pelvis 12/05/2023 FINDINGS: Gallbladder: Gallbladder sludge within the gallbladder lumen. Gallbladder wall thickening and pericholecystic fluid. No sonographic Murphy sign noted by sonographer. Common bile duct: Diameter: 3 mm Liver: No focal lesion identified. Within normal limits in parenchymal echogenicity. Portal vein is patent on color Doppler imaging with normal direction of blood flow towards the liver. Other: None. IMPRESSION: Gallbladder sludge with acute cholecystitis. Electronically  Signed   By: Morgane  Naveau M.D.   On: 12/05/2023 21:33   CT ABDOMEN PELVIS WO CONTRAST Result Date: 12/05/2023 CLINICAL DATA:  Abdominal pain. EXAM: CT ABDOMEN AND PELVIS WITHOUT CONTRAST TECHNIQUE: Multidetector CT imaging of the abdomen and pelvis was performed following the standard protocol without IV contrast. RADIATION DOSE REDUCTION: This exam was performed according to the departmental dose-optimization program which includes automated exposure control, adjustment of the mA and/or kV according to patient size and/or use of iterative reconstruction technique. COMPARISON:  07/21/2021. FINDINGS: Lower chest: Bibasilar consolidation may represent pneumonia or volume loss. Cardiomegaly. Atheromatous calcifications. Hepatobiliary: No focal abnormality identified in the liver without contrast. The gallbladder is distended. There is pericholecystic fluid and fat  stranding. These findings suggest cholecystitis which can be assessed further with HIDA scan, if indicated. Pancreas: Unremarkable. No pancreatic ductal dilatation or surrounding inflammatory changes. Spleen: Normal in size without focal abnormality. Adrenals/Urinary Tract: No adrenal lesions. No hydronephrosis or nephrolithiasis. Right kidney 1.5 cm lesion consistent with a cyst with calcification. No ureteral stones. Urinary bladder was nearly empty with wall thickening which could be due to inflammation or hypertrophy. Stomach/Bowel: Loops of small bowel are dilated in the midabdomen without a discrete transition point to normal caliber bowel distally. This could represent a enteritis. Normal stool and air in the rectosigmoid. Vascular/Lymphatic: Aortic atherosclerosis. No enlarged abdominal or pelvic lymph nodes. Distal abdominal aortic aneurysm measuring 4.1 cm. Follow up with CTA or MRA in 12 months. Reproductive: Prostate is enlarged, 7.0 x 6.3 x 5.7 cm. Other: Left inguinal hernia noted that contains omental fat. No herniated bowel or evidence of incarceration. Musculoskeletal: Thoracolumbosacral degenerative changes. IMPRESSION: 1. Findings suspicious for cholecystitis. 2. Bibasilar consolidation. 3. Enlarged prostate. 4. Dilated small bowel loops without a discrete transition point. This could represent a nonspecific enteritis. 5. Abdominal aortic aneurysm, 4.1 cm. Follow up with CTA or MRA in 12 months. 6. Left inguinal hernia containing omental fat. 7. Aortic atherosclerosis (ICD10-I70.0). Electronically Signed   By: Fonda Field M.D.   On: 12/05/2023 19:24    CBC Recent Labs  Lab 12/05/23 1449 12/06/23 0500  WBC 22.7* 18.3*  HGB 13.7 12.8*  HCT 42.5 38.6*  PLT 189 147*  MCV 95.9 94.4  MCH 30.9 31.3  MCHC 32.2 33.2  RDW 14.6 14.6    Chemistries  Recent Labs  Lab 12/05/23 1449 12/06/23 0500  NA 139 136  K 3.9 3.4*  CL 108 107  CO2 17* 19*  GLUCOSE 99 88  BUN 34* 44*   CREATININE 2.25* 2.94*  CALCIUM  9.0 8.3*  AST 158* 193*  ALT 123* 96*  ALKPHOS 82 74  BILITOT 2.9* 2.0*   Coagulation profile Recent Labs  Lab 12/04/23 1555 12/05/23 1842 12/06/23 0500  INR 2.8 2.4* 2.3*    Signature  Lavada Stank M.D on 12/07/2023 at 9:39 AM   -  To page go to [email or web address]

## 2023-12-07 NOTE — Plan of Care (Signed)
 Pt agitated earlier in the shift, prn haldol  given. Pt was able to rest without further intervention throughout the night.   Problem: Activity: Goal: Risk for activity intolerance will decrease Outcome: Progressing   Problem: Pain Management: Goal: General experience of comfort will improve Outcome: Progressing   Problem: Safety: Goal: Ability to remain free from injury will improve Outcome: Progressing

## 2023-12-08 DIAGNOSIS — K810 Acute cholecystitis: Secondary | ICD-10-CM | POA: Diagnosis not present

## 2023-12-08 NOTE — Progress Notes (Signed)
 Receive a return call from Father Ree Kida from [facility], provided him with patient's daughter Maria's phone number. He will call the daughter and touch bases, then met here at the hospital this afternoon to perform last rites.

## 2023-12-08 NOTE — Progress Notes (Addendum)
 Daily Progress Note   Patient Name: Roberto Dickson       Date: 12/08/2023 DOB: 1941/10/31  Age: 83 y.o. MRN#: [id number] Attending Physician: Dennise Lavada POUR, MD Primary Care Physician: Purcell Emil Schanz, MD Admit Date: 12/05/2023  Reason for Consultation/Follow-up: Establishing goals of care   Length of Stay: 3  Current Medications: Scheduled Meds:    Continuous Infusions:  morphine  5 mg/hr (12/08/23 0643)    PRN Meds: acetaminophen  **OR** acetaminophen , glycopyrrolate , haloperidol  lactate, LORazepam , morphine , [DISCONTINUED] ondansetron  **OR** ondansetron  (ZOFRAN ) IV  Physical Exam Constitutional:      Appearance: He is ill-appearing.     Comments: HHFNC  HENT:     Head: Normocephalic and atraumatic.  Cardiovascular:     Rate and Rhythm: Tachycardia present.  Pulmonary:     Effort: Pulmonary effort is normal.  Skin:    General: Skin is warm and dry.             Vital Signs: BP (!) 75/49   Pulse (!) 113   Temp 98.6 F (37 C) (Oral)   Resp 16   Ht 5' 7 (1.702 m)   Wt 78.9 kg   SpO2 93%   BMI 27.25 kg/m  SpO2: SpO2: 93 % O2 Device: O2 Device: Heated High Flow Nasal Cannula O2 Flow Rate: O2 Flow Rate (L/min): 25 L/min       Palliative Assessment/Data: 10%      Patient Active Problem List   Diagnosis Date Noted   Hypokalemia 12/06/2023   Acute cholecystitis 12/05/2023   Elevated LFTs 12/05/2023   Severe sepsis (HCC) 12/05/2023   AAA (abdominal aortic aneurysm) (HCC) 12/05/2023   Chronic heart failure with mildly reduced ejection fraction (HFmrEF, 41-49%) (HCC) 12/05/2023   Bilateral hearing loss 10/25/2023   Onychomycosis 10/25/2023   HFrEF (heart failure with reduced ejection fraction) (HCC) 05/03/2023   Bilateral hip pain 01/25/2023   Acute  respiratory failure with hypoxia (HCC) 01/09/2023   History of seizure 08/24/2022   History of stroke 11/09/2021   Hyperlipidemia 10/21/2021   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   Coronary artery disease of native artery of native heart with stable angina pectoris (HCC)    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/12/2021   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   Acute renal failure superimposed on stage 3b chronic kidney disease (HCC)  Sleep disturbance    Essential hypertension     Palliative Care Assessment & Plan   Patient Profile: 83 y.o. 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 Patient's hospital course was complicated SIRS. After discussions with CCM, family opted for patient's transition to full comfort measures.   Assessment: Patient appears comfortable and in NAD. No family at bedside. Patient remains on morphine  drip. As needed medications for symptom management are available.  1120: Spoke with patient's daughter Hadassah by phone. We discussed symptom management at end of life. Answered questions and provided support. Hadassah states her father's faith is very important to him and she requests last rites-- messaged spiritual care for assistance with last rights. Encouraged Hadassah to call PMT with any other questions or concerns.  Recommendations/Plan: Full comfort measures- anticipate hospital death If patient linger over next day or two, can wean oxygen if family is agreeable Symptom management per [facility] Page chaplain re: assistance with last rites PMT will continue to follow and support    Code Status:    Code Status Orders  (From admission, onward)           Start     Ordered   12/06/23 1832  Do not attempt resuscitation (DNR) - Comfort care  (Code Status)  Continuous        Question Answer Comment  If patient has no pulse and is not breathing Do Not Attempt Resuscitation   In Pre-Arrest Conditions (Patient Is Breathing and Has a Pulse) Provide comfort measures. Relieve any mechanical airway obstruction. Avoid transfer unless required for comfort.   Consent: Discussion documented in EHR or advanced directives reviewed      12/06/23 1831         Prognosis:  Hours - Days  Discharge Planning: Anticipated Hospital Death  Time: 25 minutes  Thank you for allowing the Palliative Medicine Team to assist in the care of this patient.    Stephane CHRISTELLA Palin, NP  Please contact Palliative Medicine Team phone at [phone] for questions and concerns.

## 2023-12-08 NOTE — Progress Notes (Signed)
 Roberto Dickson, is a 83 y.o. male, DOB - 1941-03-26, FMW:[id number]  83 y.o. 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   Patient had been exposed to a grandson with upper respiratory symptoms and the patient himself developed a mild cough and general malaise on 12/01/2023.  By 12/03/2023, patient was feeling much better but then had acute onset postprandial nausea and vomiting.  He complained of some abdominal pain at that time but none since.    Further workup was consistent with cute cholecystitis with sepsis, he also had acute hypoxic respiratory failure, AKI with underlying history of CAD and left ventricular thrombus.  He was seen by the hospitalist service, general surgery, cardiology and PCCM.  After discussions with family he was transition to full comfort care on 12/06/2023 and transferred to my service on 12/07/2023.  Currently he is under full comfort care, will consult Perative care to see if he is appropriate for hospice.  All non comfort medications have been stopped.    Vitals:   12/07/23 1414 12/07/23 1600 12/07/23 2109 12/08/23 0240  BP:  (!) 75/49    Pulse:  (!) 109 (!) 109 (!) 113  Resp: 20 14 15 16   Temp:      TempSrc:      SpO2:  93%    Weight:      Height:       Physical exam.  Elderly Hispanic male lying in hospital bed in no distress but currently unresponsive, CTAB Rapid heart rate no gallops rubs or murmurs Abdomen soft No edema     Data Review   Micro Results Recent Results (from the past 240 hours)  Blood culture (routine x 2)     Status: None (Preliminary result)   Collection Time: 12/05/23  6:38 PM   Specimen: BLOOD RIGHT HAND  Result Value Ref Range Status   Specimen Description BLOOD RIGHT HAND   Final   Special Requests   Final    BOTTLES DRAWN AEROBIC AND ANAEROBIC Blood Culture results may not be optimal due to an inadequate volume of blood received in culture bottles   Culture   Final    NO GROWTH 3 DAYS Performed at [facility], [address] [place], [place] [address]    Report Status PENDING  Incomplete  Resp panel by RT-PCR (RSV, Flu A&B, Covid) Anterior Nasal Swab     Status: None   Collection Time: 12/05/23  6:41 PM   Specimen: Anterior Nasal Swab  Result Value Ref Range Status   SARS Coronavirus 2 by RT PCR NEGATIVE NEGATIVE Final   Influenza A by PCR NEGATIVE NEGATIVE Final   Influenza B by PCR NEGATIVE NEGATIVE Final    Comment: (NOTE) The Xpert Xpress SARS-CoV-2/FLU/RSV plus assay is intended as an aid in the diagnosis of influenza from Nasopharyngeal swab specimens and should not be used as a sole basis for treatment. Nasal washings and aspirates are unacceptable for Xpert Xpress SARS-CoV-2/FLU/RSV testing.  Fact Sheet for Patients: bloggercourse.com  Fact Sheet for Healthcare Providers: seriousbroker.it  This test is not yet approved or cleared by the [place]  FDA and has been authorized for detection and/or diagnosis of SARS-CoV-2 by FDA under an Emergency Use Authorization (EUA). This EUA will remain in effect (meaning this test can be used) for the duration of the COVID-19 declaration under Section 564(b)(1) of the Act,  21 U.S.C. section 360bbb-3(b)(1), unless the authorization is terminated or revoked.     Resp Syncytial Virus by PCR NEGATIVE NEGATIVE Final    Comment: (NOTE) Fact Sheet for Patients: bloggercourse.com  Fact Sheet for Healthcare Providers: seriousbroker.it  This test is not yet approved or cleared by the [place]  FDA and has been authorized for detection and/or diagnosis of SARS-CoV-2 by FDA under an Emergency  Use Authorization (EUA). This EUA will remain in effect (meaning this test can be used) for the duration of the COVID-19 declaration under Section 564(b)(1) of the Act, 21 U.S.C. section 360bbb-3(b)(1), unless the authorization is terminated or revoked.  Performed at [facility], [address] [place], [place] [address]   Blood culture (routine x 2)     Status: None (Preliminary result)   Collection Time: 12/05/23  6:42 PM   Specimen: BLOOD LEFT HAND  Result Value Ref Range Status   Specimen Description BLOOD LEFT HAND  Final   Special Requests   Final    BOTTLES DRAWN AEROBIC AND ANAEROBIC Blood Culture results may not be optimal due to an inadequate volume of blood received in culture bottles   Culture   Final    NO GROWTH 3 DAYS Performed at [facility], [address], [place], [place] [address]    Report Status PENDING  Incomplete  Culture, blood (x 2)     Status: None (Preliminary result)   Collection Time: 12/05/23 10:13 PM   Specimen: BLOOD  Result Value Ref Range Status   Specimen Description BLOOD LEFT ANTECUBITAL  Final   Special Requests   Final    BOTTLES DRAWN AEROBIC AND ANAEROBIC Blood Culture adequate volume   Culture   Final    NO GROWTH 3 DAYS Performed at [facility], [address] [place], [place] [address]    Report Status PENDING  Incomplete  Culture, blood (x 2)     Status: None (Preliminary result)   Collection Time: 12/06/23  3:59 PM   Specimen: BLOOD LEFT ARM  Result Value Ref Range Status   Specimen Description BLOOD LEFT ARM  Final   Special Requests   Final    BOTTLES DRAWN AEROBIC ONLY Blood Culture results may not be optimal due to an inadequate volume of blood received in culture bottles   Culture   Final    NO GROWTH 2 DAYS Performed at [facility], [address] [place], [place] [address]    Report Status PENDING  Incomplete    Radiology Reports DG CHEST PORT 1 VIEW Result Date: 12/06/2023 CLINICAL  DATA:  Respiratory failure, hypoxia EXAM: PORTABLE CHEST 1 VIEW COMPARISON:  01/09/2023 FINDINGS: Low volume AP portable examination. Cardiomegaly. No acute airspace opacity. No acute osseous findings. IMPRESSION: Low volume AP portable examination. Cardiomegaly without acute abnormality of the lungs. Electronically Signed   By: Marolyn JONETTA Jaksch M.D.   On: 12/06/2023 07:48   US  Abdomen Limited RUQ (LIVER/GB) Result Date: 12/05/2023 CLINICAL DATA:  151471 RUQ pain 151471 EXAM: ULTRASOUND ABDOMEN LIMITED RIGHT UPPER QUADRANT COMPARISON:  CT abdomen pelvis 12/05/2023 FINDINGS: Gallbladder: Gallbladder sludge within the gallbladder lumen. Gallbladder wall thickening and pericholecystic fluid. No sonographic Murphy sign noted by sonographer. Common bile duct: Diameter: 3 mm Liver: No focal lesion identified. Within normal limits in parenchymal echogenicity. Portal vein is patent on color Doppler imaging with normal direction of blood flow towards the liver. Other: None. IMPRESSION: Gallbladder sludge with acute cholecystitis. Electronically Signed  By: Morgane  Naveau M.D.   On: 12/05/2023 21:33   CT ABDOMEN PELVIS WO CONTRAST Result Date: 12/05/2023 CLINICAL DATA:  Abdominal pain. EXAM: CT ABDOMEN AND PELVIS WITHOUT CONTRAST TECHNIQUE: Multidetector CT imaging of the abdomen and pelvis was performed following the standard protocol without IV contrast. RADIATION DOSE REDUCTION: This exam was performed according to the departmental dose-optimization program which includes automated exposure control, adjustment of the mA and/or kV according to patient size and/or use of iterative reconstruction technique. COMPARISON:  07/21/2021. FINDINGS: Lower chest: Bibasilar consolidation may represent pneumonia or volume loss. Cardiomegaly. Atheromatous calcifications. Hepatobiliary: No focal abnormality identified in the liver without contrast. The gallbladder is distended. There is pericholecystic fluid and fat stranding. These  findings suggest cholecystitis which can be assessed further with HIDA scan, if indicated. Pancreas: Unremarkable. No pancreatic ductal dilatation or surrounding inflammatory changes. Spleen: Normal in size without focal abnormality. Adrenals/Urinary Tract: No adrenal lesions. No hydronephrosis or nephrolithiasis. Right kidney 1.5 cm lesion consistent with a cyst with calcification. No ureteral stones. Urinary bladder was nearly empty with wall thickening which could be due to inflammation or hypertrophy. Stomach/Bowel: Loops of small bowel are dilated in the midabdomen without a discrete transition point to normal caliber bowel distally. This could represent a enteritis. Normal stool and air in the rectosigmoid. Vascular/Lymphatic: Aortic atherosclerosis. No enlarged abdominal or pelvic lymph nodes. Distal abdominal aortic aneurysm measuring 4.1 cm. Follow up with CTA or MRA in 12 months. Reproductive: Prostate is enlarged, 7.0 x 6.3 x 5.7 cm. Other: Left inguinal hernia noted that contains omental fat. No herniated bowel or evidence of incarceration. Musculoskeletal: Thoracolumbosacral degenerative changes. IMPRESSION: 1. Findings suspicious for cholecystitis. 2. Bibasilar consolidation. 3. Enlarged prostate. 4. Dilated small bowel loops without a discrete transition point. This could represent a nonspecific enteritis. 5. Abdominal aortic aneurysm, 4.1 cm. Follow up with CTA or MRA in 12 months. 6. Left inguinal hernia containing omental fat. 7. Aortic atherosclerosis (ICD10-I70.0). Electronically Signed   By: Fonda Field M.D.   On: 12/05/2023 19:24    CBC Recent Labs  Lab 12/05/23 1449 12/06/23 0500  WBC 22.7* 18.3*  HGB 13.7 12.8*  HCT 42.5 38.6*  PLT 189 147*  MCV 95.9 94.4  MCH 30.9 31.3  MCHC 32.2 33.2  RDW 14.6 14.6    Chemistries  Recent Labs  Lab 12/05/23 1449 12/06/23 0500  NA 139 136  K 3.9 3.4*  CL 108 107  CO2 17* 19*  GLUCOSE 99 88  BUN 34* 44*  CREATININE 2.25* 2.94*   CALCIUM  9.0 8.3*  AST 158* 193*  ALT 123* 96*  ALKPHOS 82 74  BILITOT 2.9* 2.0*   Coagulation profile Recent Labs  Lab 12/04/23 1555 12/05/23 1842 12/06/23 0500  INR 2.8 2.4* 2.3*    Signature  Lavada Stank M.D on 12/08/2023 at 10:18 AM   -  To page go to [email or web address]

## 2023-12-08 NOTE — Progress Notes (Signed)
   12/08/23 1143  Spiritual Encounters  Type of Visit Initial  Care provided to: [facility] partners present during encounter Nurse  Referral source Nurse (RN/NT/LPN)  Reason for visit End-of-life  OnCall Visit Yes   Received call from Nurse Dawn [phone]) patient moved to comfort care. Family requesting [employer] [facility] to perform last rites. Called Father Camellia and Father Marinell. Left message for both.  Spoke with daughter Hadassah who would like for the priest to call her at [phone]. She can be present however rite can be performed in her absence as well.

## 2023-12-09 DIAGNOSIS — Z515 Encounter for palliative care: Secondary | ICD-10-CM

## 2023-12-09 DIAGNOSIS — K810 Acute cholecystitis: Secondary | ICD-10-CM | POA: Diagnosis not present

## 2023-12-09 NOTE — Progress Notes (Signed)
 Daily Progress Note   Patient Name: Roberto Dickson       Date: 12/09/2023 DOB: 1941/02/01  Age: 83 y.o. MRN#: [id number] Attending Physician: Dennise Lavada POUR, MD Primary Care Physician: Purcell Emil Schanz, MD Admit Date: 12/05/2023  Reason for Consultation/Follow-up: Establishing goals of care   Length of Stay: 4  Current Medications: Scheduled Meds:    Continuous Infusions:  morphine  8 mg/hr (12/09/23 0232)    PRN Meds: acetaminophen  **OR** acetaminophen , glycopyrrolate , haloperidol  lactate, LORazepam , morphine , [DISCONTINUED] ondansetron  **OR** ondansetron  (ZOFRAN ) IV  Physical Exam Constitutional:      Appearance: He is ill-appearing.     Comments: HHFNC  HENT:     Head: Normocephalic and atraumatic.  Skin:    General: Skin is warm and dry.             Vital Signs: BP (!) 75/49   Pulse (!) 112   Temp 98.6 F (37 C) (Oral)   Resp 18   Ht 5' 7 (1.702 m)   Wt 78.9 kg   SpO2 90%   BMI 27.25 kg/m  SpO2: SpO2: 90 % O2 Device: O2 Device: High Flow Nasal Cannula O2 Flow Rate: O2 Flow Rate (L/min): 25 L/min       Palliative Assessment/Data: 10%      Patient Active Problem List   Diagnosis Date Noted   Hypokalemia 12/06/2023   Acute cholecystitis 12/05/2023   Elevated LFTs 12/05/2023   Severe sepsis (HCC) 12/05/2023   AAA (abdominal aortic aneurysm) (HCC) 12/05/2023   Chronic heart failure with mildly reduced ejection fraction (HFmrEF, 41-49%) (HCC) 12/05/2023   Bilateral hearing loss 10/25/2023   Onychomycosis 10/25/2023   HFrEF (heart failure with reduced ejection fraction) (HCC) 05/03/2023   Bilateral hip pain 01/25/2023   Acute respiratory failure with hypoxia (HCC) 01/09/2023   History of seizure 08/24/2022   History of stroke 11/09/2021    Hyperlipidemia 10/21/2021   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   Coronary artery disease of native artery of native heart with stable angina pectoris (HCC)    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/12/2021   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   Acute renal failure superimposed on stage 3b chronic kidney disease (HCC)    Sleep disturbance    Essential hypertension     Palliative Care Assessment & Plan   Patient Profile:  83 y.o. 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 Patient's hospital course was complicated SIRS. After discussions with CCM, family opted for patient's transition to full comfort measures.   Assessment: Patient appears comfortable and in NAD. No family at bedside. Patient remains on morphine  drip. As needed medications for symptom management are available.    Recommendations/Plan: Full comfort measures- anticipate hospital death If patient linger over next day or two, can wean oxygen if family is agreeable Symptom management per [facility] Page chaplain re: assistance with last rites PMT will continue to follow and support    Code Status:    Code Status Orders  (From admission, onward)           Start     Ordered   12/06/23 1832  Do not attempt resuscitation (DNR) - Comfort care  (Code Status)  Continuous       Question Answer Comment  If patient has no pulse and is not breathing Do Not Attempt Resuscitation   In Pre-Arrest Conditions (Patient Is Breathing and Has a Pulse) Provide comfort measures. Relieve any mechanical airway obstruction. Avoid transfer unless required for comfort.   Consent: Discussion documented in EHR or advanced directives reviewed      12/06/23 1831         Extensive chart review has been completed prior to seeing the patient including  progress/consult notes, orders, medications, and available advance directive documents.  Prognosis:  Hours - Days  Discharge Planning: Anticipated Hospital Death  Time: 25 minutes  Thank you for allowing the Palliative Medicine Team to assist in the care of this patient.    Stephane CHRISTELLA Palin, NP  Please contact Palliative Medicine Team phone at [phone] for questions and concerns.

## 2023-12-09 NOTE — Progress Notes (Signed)
 Roberto Dickson, is a 83 y.o. male, DOB - 11-Jun-1941, FMW:[id number]  83 y.o. 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   Patient had been exposed to a grandson with upper respiratory symptoms and the patient himself developed a mild cough and general malaise on 12/01/2023.  By 12/03/2023, patient was feeling much better but then had acute onset postprandial nausea and vomiting.  He complained of some abdominal pain at that time but none since.    Further workup was consistent with cute cholecystitis with sepsis, he also had acute hypoxic respiratory failure, AKI with underlying history of CAD and left ventricular thrombus.  He was seen by the hospitalist service, general surgery, cardiology and PCCM.  After discussions with family he was transition to full comfort care on 12/06/2023 and transferred to my service on 12/07/2023.  Currently he is under full comfort care, will consult Perative care to see if he is appropriate for hospice.  All non comfort medications have been stopped.    Vitals:   12/08/23 2105 12/09/23 0339 12/09/23 0900 12/09/23 0920  BP:      Pulse: (!) 113 (!) 112    Resp: (!) 21 18 (!) 21   Temp:      TempSrc:      SpO2: 90% 90% 90% 90%  Weight:      Height:       Physical exam.  Elderly Hispanic male lying in hospital bed in no distress but currently unresponsive, CTAB Rapid heart rate no gallops rubs or murmurs Abdomen soft No edema     Data Review   Micro Results Recent Results (from the past 240 hours)  Blood culture (routine x 2)     Status: None (Preliminary result)   Collection Time: 12/05/23  6:38 PM   Specimen: BLOOD RIGHT HAND  Result Value Ref Range Status   Specimen Description BLOOD RIGHT HAND   Final   Special Requests   Final    BOTTLES DRAWN AEROBIC AND ANAEROBIC Blood Culture results may not be optimal due to an inadequate volume of blood received in culture bottles   Culture   Final    NO GROWTH 4 DAYS Performed at [facility], [address], [place], [place] [address]    Report Status PENDING  Incomplete  Resp panel by RT-PCR (RSV, Flu A&B, Covid) Anterior Nasal Swab     Status: None   Collection Time: 12/05/23  6:41 PM   Specimen: Anterior Nasal Swab  Result Value Ref Range Status   SARS Coronavirus 2 by RT PCR NEGATIVE NEGATIVE Final   Influenza A by PCR NEGATIVE NEGATIVE Final   Influenza B by PCR NEGATIVE NEGATIVE Final    Comment: (NOTE) The Xpert Xpress SARS-CoV-2/FLU/RSV plus assay is intended as an aid in the diagnosis of influenza from Nasopharyngeal swab specimens and should not be used as a sole basis for treatment. Nasal washings and aspirates are unacceptable for Xpert Xpress SARS-CoV-2/FLU/RSV testing.  Fact Sheet for Patients: bloggercourse.com  Fact Sheet for Healthcare Providers: seriousbroker.it  This test is not yet approved or cleared by the [place]  FDA and has been authorized for detection and/or diagnosis of SARS-CoV-2 by FDA under an Emergency Use Authorization (EUA). This EUA will remain in effect (meaning this test can be used) for the duration of the COVID-19 declaration under Section 564(b)(1) of the Act,  21 U.S.C. section 360bbb-3(b)(1), unless the authorization is terminated or revoked.     Resp Syncytial Virus by PCR NEGATIVE NEGATIVE Final    Comment: (NOTE) Fact Sheet for Patients: bloggercourse.com  Fact Sheet for Healthcare Providers: seriousbroker.it  This test is not yet approved or cleared by the [place]  FDA and has been authorized for detection and/or diagnosis of SARS-CoV-2 by FDA under an Emergency  Use Authorization (EUA). This EUA will remain in effect (meaning this test can be used) for the duration of the COVID-19 declaration under Section 564(b)(1) of the Act, 21 U.S.C. section 360bbb-3(b)(1), unless the authorization is terminated or revoked.  Performed at [facility], [address] [place], [place] [address]   Blood culture (routine x 2)     Status: None (Preliminary result)   Collection Time: 12/05/23  6:42 PM   Specimen: BLOOD LEFT HAND  Result Value Ref Range Status   Specimen Description BLOOD LEFT HAND  Final   Special Requests   Final    BOTTLES DRAWN AEROBIC AND ANAEROBIC Blood Culture results may not be optimal due to an inadequate volume of blood received in culture bottles   Culture   Final    NO GROWTH 4 DAYS Performed at [facility], [address] [place], [place] [address]    Report Status PENDING  Incomplete  Culture, blood (x 2)     Status: None (Preliminary result)   Collection Time: 12/05/23 10:13 PM   Specimen: BLOOD  Result Value Ref Range Status   Specimen Description BLOOD LEFT ANTECUBITAL  Final   Special Requests   Final    BOTTLES DRAWN AEROBIC AND ANAEROBIC Blood Culture adequate volume   Culture   Final    NO GROWTH 4 DAYS Performed at [facility], [address] [place], [place] [address]    Report Status PENDING  Incomplete  Culture, blood (x 2)     Status: None (Preliminary result)   Collection Time: 12/06/23  3:59 PM   Specimen: BLOOD LEFT ARM  Result Value Ref Range Status   Specimen Description BLOOD LEFT ARM  Final   Special Requests   Final    BOTTLES DRAWN AEROBIC ONLY Blood Culture results may not be optimal due to an inadequate volume of blood received in culture bottles   Culture   Final    NO GROWTH 3 DAYS Performed at [facility], [address], [place], [place] [address]    Report Status PENDING  Incomplete    Radiology Reports DG CHEST PORT 1 VIEW Result Date: 12/06/2023 CLINICAL  DATA:  Respiratory failure, hypoxia EXAM: PORTABLE CHEST 1 VIEW COMPARISON:  01/09/2023 FINDINGS: Low volume AP portable examination. Cardiomegaly. No acute airspace opacity. No acute osseous findings. IMPRESSION: Low volume AP portable examination. Cardiomegaly without acute abnormality of the lungs. Electronically Signed   By: Marolyn JONETTA Jaksch M.D.   On: 12/06/2023 07:48   US  Abdomen Limited RUQ (LIVER/GB) Result Date: 12/05/2023 CLINICAL DATA:  151471 RUQ pain 151471 EXAM: ULTRASOUND ABDOMEN LIMITED RIGHT UPPER QUADRANT COMPARISON:  CT abdomen pelvis 12/05/2023 FINDINGS: Gallbladder: Gallbladder sludge within the gallbladder lumen. Gallbladder wall thickening and pericholecystic fluid. No sonographic Murphy sign noted by sonographer. Common bile duct: Diameter: 3 mm Liver: No focal lesion identified. Within normal limits in parenchymal echogenicity. Portal vein is patent on color Doppler imaging with normal direction of blood flow towards the liver. Other: None. IMPRESSION: Gallbladder sludge with acute cholecystitis. Electronically Signed  By: Morgane  Naveau M.D.   On: 12/05/2023 21:33   CT ABDOMEN PELVIS WO CONTRAST Result Date: 12/05/2023 CLINICAL DATA:  Abdominal pain. EXAM: CT ABDOMEN AND PELVIS WITHOUT CONTRAST TECHNIQUE: Multidetector CT imaging of the abdomen and pelvis was performed following the standard protocol without IV contrast. RADIATION DOSE REDUCTION: This exam was performed according to the departmental dose-optimization program which includes automated exposure control, adjustment of the mA and/or kV according to patient size and/or use of iterative reconstruction technique. COMPARISON:  07/21/2021. FINDINGS: Lower chest: Bibasilar consolidation may represent pneumonia or volume loss. Cardiomegaly. Atheromatous calcifications. Hepatobiliary: No focal abnormality identified in the liver without contrast. The gallbladder is distended. There is pericholecystic fluid and fat stranding. These  findings suggest cholecystitis which can be assessed further with HIDA scan, if indicated. Pancreas: Unremarkable. No pancreatic ductal dilatation or surrounding inflammatory changes. Spleen: Normal in size without focal abnormality. Adrenals/Urinary Tract: No adrenal lesions. No hydronephrosis or nephrolithiasis. Right kidney 1.5 cm lesion consistent with a cyst with calcification. No ureteral stones. Urinary bladder was nearly empty with wall thickening which could be due to inflammation or hypertrophy. Stomach/Bowel: Loops of small bowel are dilated in the midabdomen without a discrete transition point to normal caliber bowel distally. This could represent a enteritis. Normal stool and air in the rectosigmoid. Vascular/Lymphatic: Aortic atherosclerosis. No enlarged abdominal or pelvic lymph nodes. Distal abdominal aortic aneurysm measuring 4.1 cm. Follow up with CTA or MRA in 12 months. Reproductive: Prostate is enlarged, 7.0 x 6.3 x 5.7 cm. Other: Left inguinal hernia noted that contains omental fat. No herniated bowel or evidence of incarceration. Musculoskeletal: Thoracolumbosacral degenerative changes. IMPRESSION: 1. Findings suspicious for cholecystitis. 2. Bibasilar consolidation. 3. Enlarged prostate. 4. Dilated small bowel loops without a discrete transition point. This could represent a nonspecific enteritis. 5. Abdominal aortic aneurysm, 4.1 cm. Follow up with CTA or MRA in 12 months. 6. Left inguinal hernia containing omental fat. 7. Aortic atherosclerosis (ICD10-I70.0). Electronically Signed   By: Fonda Field M.D.   On: 12/05/2023 19:24    CBC Recent Labs  Lab 12/05/23 1449 12/06/23 0500  WBC 22.7* 18.3*  HGB 13.7 12.8*  HCT 42.5 38.6*  PLT 189 147*  MCV 95.9 94.4  MCH 30.9 31.3  MCHC 32.2 33.2  RDW 14.6 14.6    Chemistries  Recent Labs  Lab 12/05/23 1449 12/06/23 0500  NA 139 136  K 3.9 3.4*  CL 108 107  CO2 17* 19*  GLUCOSE 99 88  BUN 34* 44*  CREATININE 2.25* 2.94*   CALCIUM  9.0 8.3*  AST 158* 193*  ALT 123* 96*  ALKPHOS 82 74  BILITOT 2.9* 2.0*   Coagulation profile Recent Labs  Lab 12/04/23 1555 12/05/23 1842 12/06/23 0500  INR 2.8 2.4* 2.3*    Signature  Lavada Stank M.D on 12/09/2023 at 9:59 AM   -  To page go to [email or web address]

## 2023-12-10 ENCOUNTER — Other Ambulatory Visit: Payer: Self-pay | Admitting: Emergency Medicine

## 2023-12-10 DIAGNOSIS — K810 Acute cholecystitis: Secondary | ICD-10-CM | POA: Diagnosis not present

## 2023-12-10 LAB — CULTURE, BLOOD (ROUTINE X 2)
Culture: NO GROWTH
Culture: NO GROWTH
Culture: NO GROWTH
Special Requests: ADEQUATE

## 2023-12-11 LAB — CULTURE, BLOOD (ROUTINE X 2): Culture: NO GROWTH

## 2023-12-12 ENCOUNTER — Ambulatory Visit: Payer: Medicare Other | Admitting: Audiologist

## 2024-01-05 NOTE — Death Summary Note (Signed)
 [facility] Death Note                                                                                                                                                                                               Roberto Dickson, is a 83 y.o. male, DOB - 1941/07/13, FMW:[id number]  Admit date - 2024/01/03   Admitting Physician Evalene GORMAN Sprinkles, MD  Outpatient Primary MD for the patient is Sagardia, Emil Schanz, MD  LOS - 5  Chief Complaint  Patient presents with   Diarrhea       Notification: Purcell Emil Schanz, MD notified of death of 01-08-2024   Admit Date:  January 03, 2024  Date of Death: Date of Death: 2024-01-08  Time of Death: Time of Death: 0110  Length of Stay: 5    Pronounced by - RN  History of present illness:   Roberto Dickson is a 83 y.o. 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   Patient had been exposed to a grandson with upper respiratory symptoms and the patient himself developed a mild cough and general malaise on 12/01/2023.  By 12/03/2023, patient was feeling much better but then had acute onset postprandial nausea and vomiting.  He complained of some abdominal pain at that time but none since.    Further workup was consistent with acute cholecystitis with sepsis, he also had acute hypoxic respiratory failure, AKI with underlying history of CAD and left ventricular thrombus.  He was seen by the hospitalist service, general surgery, cardiology and PCCM.  After discussions with family he was transition to full comfort care on 12/06/2023 and transferred to my service on 12/07/2023.  He was under full comfort care, Palliative care was on board,  passed away in comfort on 07-Jan-2023..   Final Diagnoses:  Cause of death -  Acute  Cholecystitis  Signature  -    Lavada Stank M.D on Jan 08, 2024 at 5:28 AM   -  To page go to [email or web address]   Total clinical and documentation time for today Under 30 minutes   Last Note  Roberto Dickson, is a 83 y.o. male, DOB - 11/30/1941, FMW:[id number]  83 y.o. 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   Patient had been exposed to a grandson with upper respiratory symptoms and the patient himself developed a mild cough and general malaise on 12/01/2023.  By 12/03/2023, patient was feeling much better but then had acute onset postprandial nausea and vomiting.  He complained of some abdominal pain at that time but none since.    Further workup was consistent with acute cholecystitis with sepsis, he also had acute hypoxic respiratory failure, AKI with underlying history of CAD and left ventricular thrombus.  He was seen by the hospitalist service, general surgery, cardiology and PCCM.  After discussions with family he was transition to full comfort care on 12/06/2023 and transferred to my service on 12/07/2023.  Currently he is under full comfort care, will consult Perative care to see if he is appropriate for hospice.  All non comfort medications have been stopped.    Vitals:   12/09/23 0900 12/09/23 0920 12/09/23 1543 12/09/23 2036  BP:      Pulse:    (!) 141  Resp: (!) 21   (!) 25  Temp:      TempSrc:      SpO2: 90% 90% (!) 89% (!) 87%  Weight:      Height:       Physical exam.  Elderly Hispanic male lying in hospital bed in no distress but currently unresponsive, CTAB Rapid heart rate no gallops rubs or murmurs Abdomen soft No edema     Data Review   Micro Results Recent Results (from the past 240 hours)  Blood culture  (routine x 2)     Status: None (Preliminary result)   Collection Time: 12/05/23  6:38 PM   Specimen: BLOOD RIGHT HAND  Result Value Ref Range Status   Specimen Description BLOOD RIGHT HAND  Final   Special Requests   Final    BOTTLES DRAWN AEROBIC AND ANAEROBIC Blood Culture results may not be optimal due to an inadequate volume of blood received in culture bottles   Culture   Final    NO GROWTH 4 DAYS Performed at [facility], [address] [place], [place] [address]    Report Status PENDING  Incomplete  Resp panel by RT-PCR (RSV, Flu A&B, Covid) Anterior Nasal Swab     Status: None   Collection Time: 12/05/23  6:41 PM   Specimen: Anterior Nasal Swab  Result Value Ref Range Status   SARS Coronavirus 2 by RT PCR NEGATIVE NEGATIVE Final   Influenza A by PCR NEGATIVE NEGATIVE Final   Influenza B by PCR NEGATIVE NEGATIVE Final    Comment: (NOTE) The Xpert Xpress SARS-CoV-2/FLU/RSV plus assay is intended as an aid in the diagnosis of influenza from Nasopharyngeal swab specimens and should not be used as a sole basis for treatment. Nasal washings and aspirates are unacceptable for Xpert Xpress SARS-CoV-2/FLU/RSV testing.  Fact Sheet for Patients: bloggercourse.com  Fact Sheet for Healthcare Providers: seriousbroker.it  This test is not yet approved or cleared by the [place]  FDA and has been authorized for detection and/or diagnosis of SARS-CoV-2 by FDA under an Emergency Use Authorization (EUA). This EUA will remain in effect (meaning this test can be used) for the duration of the COVID-19 declaration under Section 564(b)(1) of the Act, 21  U.S.C. section 360bbb-3(b)(1), unless the authorization is terminated or revoked.     Resp Syncytial Virus by PCR NEGATIVE NEGATIVE Final    Comment: (NOTE) Fact Sheet for Patients: bloggercourse.com  Fact Sheet for Healthcare  Providers: seriousbroker.it  This test is not yet approved or cleared by the [place]  FDA and has been authorized for detection and/or diagnosis of SARS-CoV-2 by FDA under an Emergency Use Authorization (EUA). This EUA will remain in effect (meaning this test can be used) for the duration of the COVID-19 declaration under Section 564(b)(1) of the Act, 21 U.S.C. section 360bbb-3(b)(1), unless the authorization is terminated or revoked.  Performed at [facility], [address], [place], [place] [address]   Blood culture (routine x 2)     Status: None (Preliminary result)   Collection Time: 12/05/23  6:42 PM   Specimen: BLOOD LEFT HAND  Result Value Ref Range Status   Specimen Description BLOOD LEFT HAND  Final   Special Requests   Final    BOTTLES DRAWN AEROBIC AND ANAEROBIC Blood Culture results may not be optimal due to an inadequate volume of blood received in culture bottles   Culture   Final    NO GROWTH 4 DAYS Performed at [facility], [address], [place], [place] [address]    Report Status PENDING  Incomplete  Culture, blood (x 2)     Status: None (Preliminary result)   Collection Time: 12/05/23 10:13 PM   Specimen: BLOOD  Result Value Ref Range Status   Specimen Description BLOOD LEFT ANTECUBITAL  Final   Special Requests   Final    BOTTLES DRAWN AEROBIC AND ANAEROBIC Blood Culture adequate volume   Culture   Final    NO GROWTH 4 DAYS Performed at [facility], [address], [place], [place] [address]    Report Status PENDING  Incomplete  Culture, blood (x 2)     Status: None (Preliminary result)   Collection Time: 12/06/23  3:59 PM   Specimen: BLOOD LEFT ARM  Result Value Ref Range Status   Specimen Description BLOOD LEFT ARM  Final   Special Requests   Final    BOTTLES DRAWN AEROBIC ONLY Blood Culture results may not be optimal due to an inadequate volume of blood received in culture bottles   Culture    Final    NO GROWTH 3 DAYS Performed at [facility], [address], [place], [place] [address]    Report Status PENDING  Incomplete    Radiology Reports DG CHEST PORT 1 VIEW Result Date: 12/06/2023 CLINICAL DATA:  Respiratory failure, hypoxia EXAM: PORTABLE CHEST 1 VIEW COMPARISON:  01/09/2023 FINDINGS: Low volume AP portable examination. Cardiomegaly. No acute airspace opacity. No acute osseous findings. IMPRESSION: Low volume AP portable examination. Cardiomegaly without acute abnormality of the lungs. Electronically Signed   By: Marolyn JONETTA Jaksch M.D.   On: 12/06/2023 07:48   US  Abdomen Limited RUQ (LIVER/GB) Result Date: 12/05/2023 CLINICAL DATA:  151471 RUQ pain 151471 EXAM: ULTRASOUND ABDOMEN LIMITED RIGHT UPPER QUADRANT COMPARISON:  CT abdomen pelvis 12/05/2023 FINDINGS: Gallbladder: Gallbladder sludge within the gallbladder lumen. Gallbladder wall thickening and pericholecystic fluid. No sonographic Murphy sign noted by sonographer. Common bile duct: Diameter: 3 mm Liver: No focal lesion identified. Within normal limits in parenchymal echogenicity. Portal vein is patent on color Doppler imaging with normal direction of blood flow towards the liver. Other: None. IMPRESSION: Gallbladder sludge with acute cholecystitis. Electronically Signed  By: Morgane  Naveau M.D.   On: 12/05/2023 21:33   CT ABDOMEN PELVIS WO CONTRAST Result Date: 12/05/2023 CLINICAL DATA:  Abdominal pain. EXAM: CT ABDOMEN AND PELVIS WITHOUT CONTRAST TECHNIQUE: Multidetector CT imaging of the abdomen and pelvis was performed following the standard protocol without IV contrast. RADIATION DOSE REDUCTION: This exam was performed according to the departmental dose-optimization program which includes automated exposure control, adjustment of the mA and/or kV according to patient size and/or use of iterative reconstruction technique. COMPARISON:  07/21/2021. FINDINGS: Lower chest: Bibasilar consolidation may represent pneumonia or  volume loss. Cardiomegaly. Atheromatous calcifications. Hepatobiliary: No focal abnormality identified in the liver without contrast. The gallbladder is distended. There is pericholecystic fluid and fat stranding. These findings suggest cholecystitis which can be assessed further with HIDA scan, if indicated. Pancreas: Unremarkable. No pancreatic ductal dilatation or surrounding inflammatory changes. Spleen: Normal in size without focal abnormality. Adrenals/Urinary Tract: No adrenal lesions. No hydronephrosis or nephrolithiasis. Right kidney 1.5 cm lesion consistent with a cyst with calcification. No ureteral stones. Urinary bladder was nearly empty with wall thickening which could be due to inflammation or hypertrophy. Stomach/Bowel: Loops of small bowel are dilated in the midabdomen without a discrete transition point to normal caliber bowel distally. This could represent a enteritis. Normal stool and air in the rectosigmoid. Vascular/Lymphatic: Aortic atherosclerosis. No enlarged abdominal or pelvic lymph nodes. Distal abdominal aortic aneurysm measuring 4.1 cm. Follow up with CTA or MRA in 12 months. Reproductive: Prostate is enlarged, 7.0 x 6.3 x 5.7 cm. Other: Left inguinal hernia noted that contains omental fat. No herniated bowel or evidence of incarceration. Musculoskeletal: Thoracolumbosacral degenerative changes. IMPRESSION: 1. Findings suspicious for cholecystitis. 2. Bibasilar consolidation. 3. Enlarged prostate. 4. Dilated small bowel loops without a discrete transition point. This could represent a nonspecific enteritis. 5. Abdominal aortic aneurysm, 4.1 cm. Follow up with CTA or MRA in 12 months. 6. Left inguinal hernia containing omental fat. 7. Aortic atherosclerosis (ICD10-I70.0). Electronically Signed   By: Fonda Field M.D.   On: 12/05/2023 19:24    CBC Recent Labs  Lab 12/05/23 1449 12/06/23 0500  WBC 22.7* 18.3*  HGB 13.7 12.8*  HCT 42.5 38.6*  PLT 189 147*  MCV 95.9 94.4   MCH 30.9 31.3  MCHC 32.2 33.2  RDW 14.6 14.6    Chemistries  Recent Labs  Lab 12/05/23 1449 12/06/23 0500  NA 139 136  K 3.9 3.4*  CL 108 107  CO2 17* 19*  GLUCOSE 99 88  BUN 34* 44*  CREATININE 2.25* 2.94*  CALCIUM  9.0 8.3*  AST 158* 193*  ALT 123* 96*  ALKPHOS 82 74  BILITOT 2.9* 2.0*   Coagulation profile Recent Labs  Lab 12/04/23 1555 12/05/23 1842 12/06/23 0500  INR 2.8 2.4* 2.3*    Signature  Lavada Stank M.D on 01/03/2024 at 5:28 AM   -  To page go to [email or web address]

## 2024-01-05 NOTE — Telephone Encounter (Signed)
 Thank you for the information.

## 2024-01-05 NOTE — Progress Notes (Signed)
 Noticed patient cardiac rhythm was agonal and subsequently asystole at 0107am, went to bedside to assess, no pulse felt centrally, no breathing effort observed. No heartbeat observed on auscultation. ECG rhythm asystole. On-call MD notified,  time of death 49am. Family condoled at bedside. [place] and decedent affair notified. Deceased body prepared per protocol and sent to the morgue at 0310am.

## 2024-01-05 DEATH — deceased

## 2024-02-11 ENCOUNTER — Ambulatory Visit: Payer: Medicare Other | Admitting: Podiatry

## 2024-02-12 IMAGING — MR MR HEAD WO/W CM
7 of 13 series · 21 of 48 positions shown · IV contrast (gadavist)
Comparison: CT from earlier the same day and MRI from 07/20/2021.

CLINICAL DATA: Initial evaluation for acute TIA.

EXAM:
MRI HEAD WITHOUT AND WITH CONTRAST
TECHNIQUE: Multiplanar, multiecho pulse sequences of the brain and surrounding
structures were obtained without and with intravenous contrast.
CONTRAST:  8.5mL GADAVIST GADOBUTROL 1 MMOL/ML IV SOLN

[Series 2: DWI · axial · 3.0mm · 0.94mm/px · z∈[-31,+114]mm · 6 of 97 slices shown (1 of 2)]
[im 1/97]
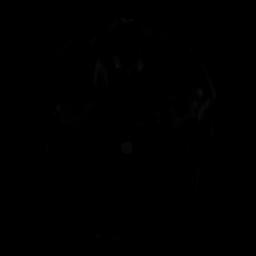
[im 20/97]
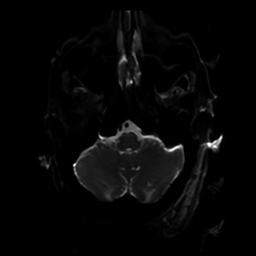
[im 39/97]
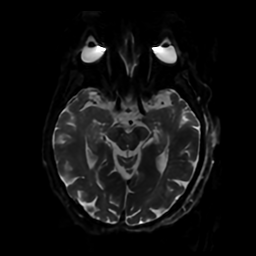
[im 58/97]
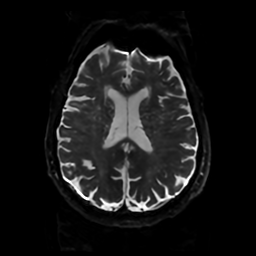
[im 77/97]
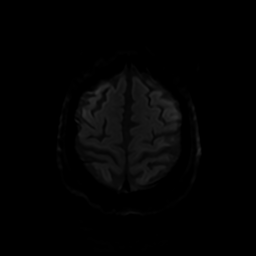
[im 97/97]
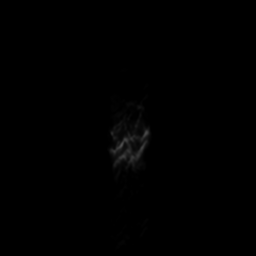

[Series 3: DWI · coronal · 4.0mm · 0.94mm/px · 5 of 73 slices shown (2 of 2)]
[im 1/73]
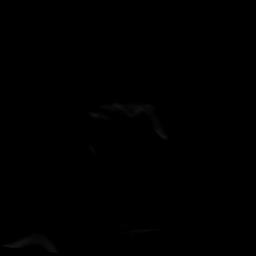
[im 19/73]
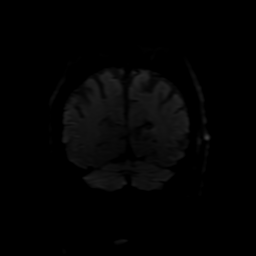
[im 37/73]
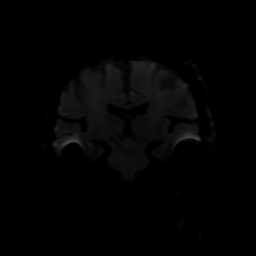
[im 55/73]
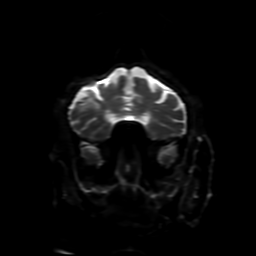
[im 73/73]
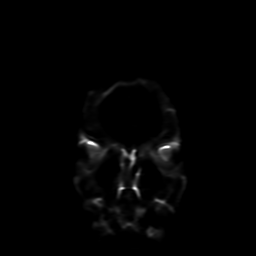

[Series 4: FLAIR · sagittal · 5.0mm · 0.23mm/px · 2 of 29 slices shown (1 of 2)]
[im 1/29]
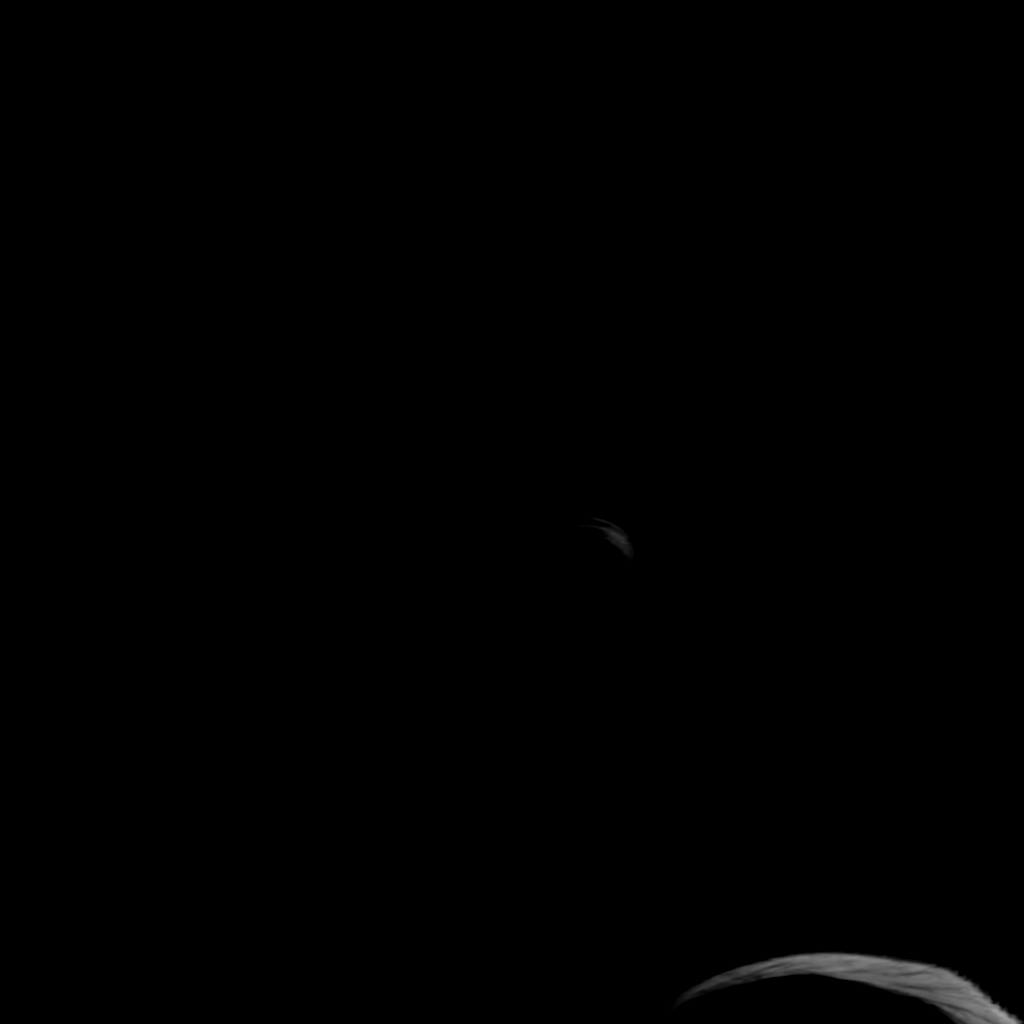
[im 29/29]
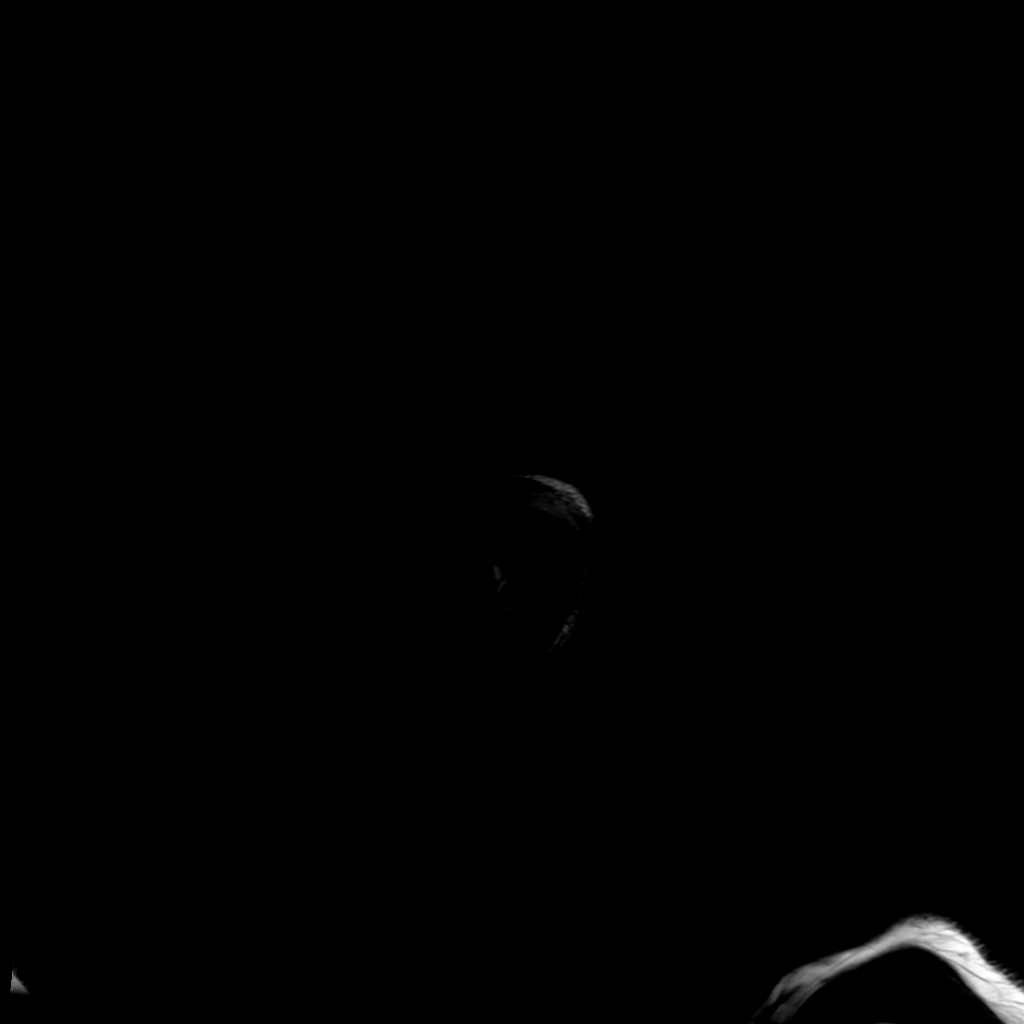

[Series 5: T2 · axial · 5.0mm · 0.23mm/px · 1 of 26 slices shown]
[im 1/26]
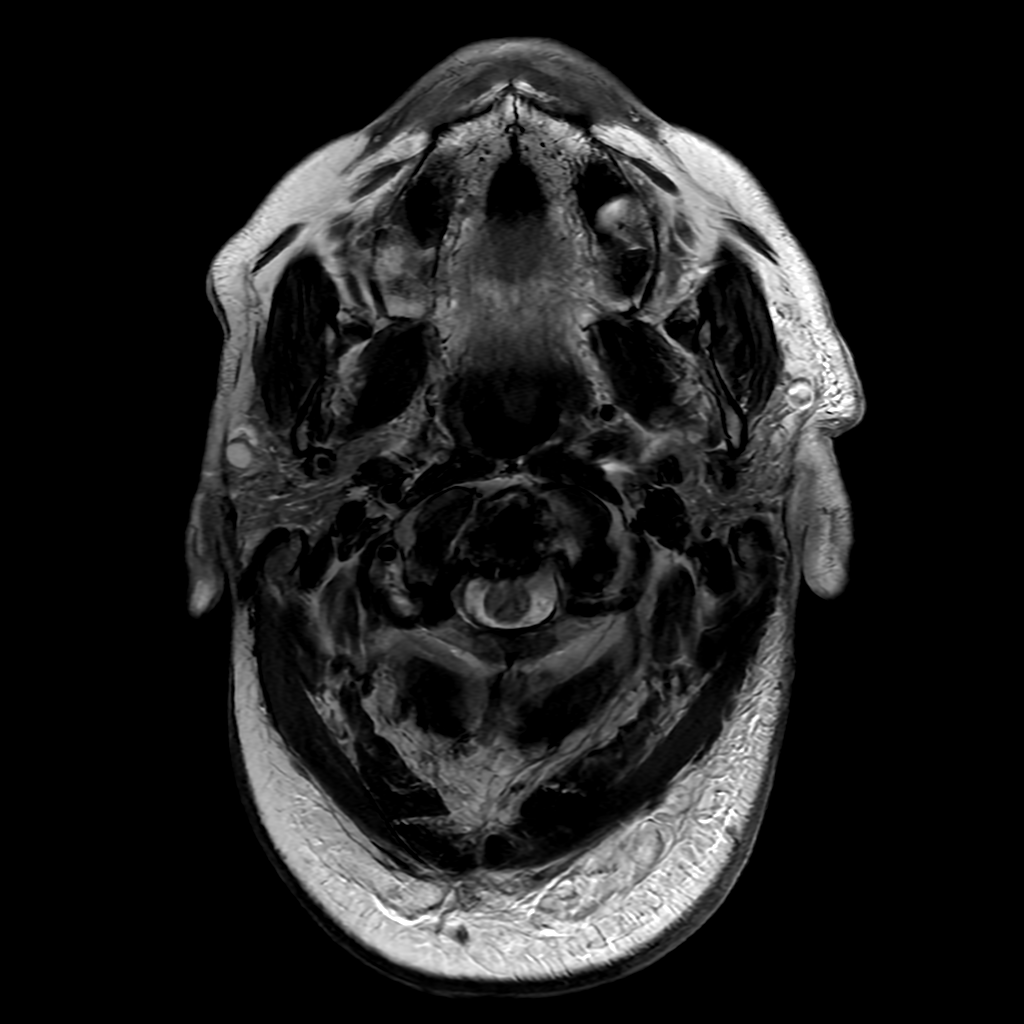

[Series 6: FLAIR · axial · 4.0mm · 0.45mm/px · z∈[-31,+117]mm · 2 of 35 slices shown (2 of 2)]
[im 1/35]
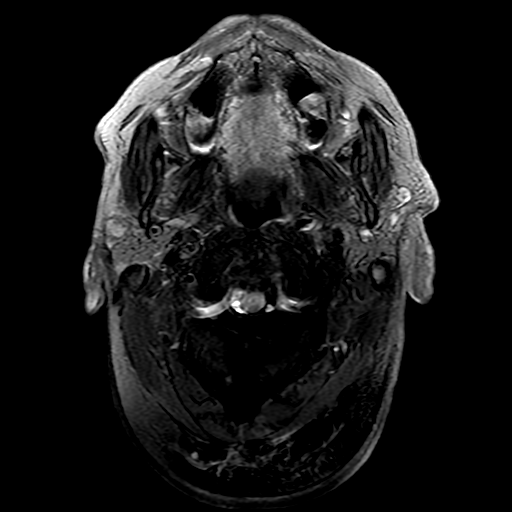
[im 35/35]
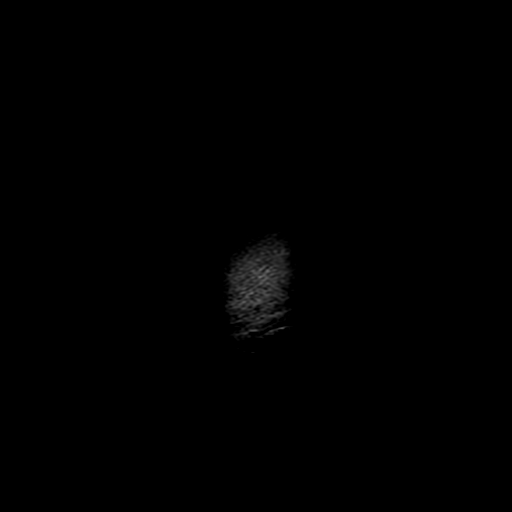

[Series 250: ADC · axial · 3.0mm · 0.94mm/px · z∈[-31,+114]mm · 3 of 50 slices shown (1 of 2)]
[im 1/50]
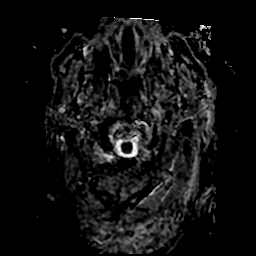
[im 25/50]
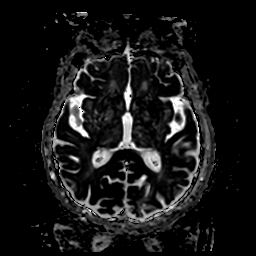
[im 50/50]
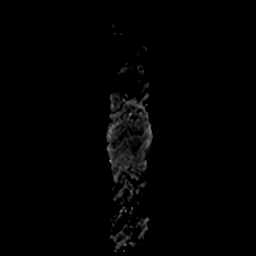

[Series 350: ADC · coronal · 4.0mm · 0.94mm/px · 2 of 33 slices shown (2 of 2)]
[im 1/33]
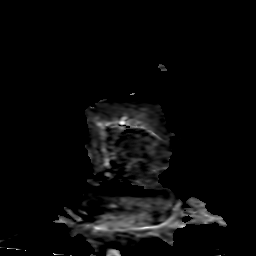
[im 33/33]
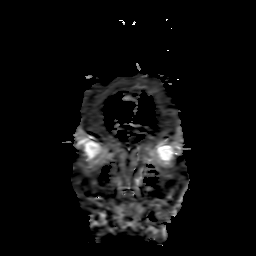

[21 of 48 positions shown; findings below may reference images not displayed]

FINDINGS: Brain: Generalized age-related cerebral atrophy. Mild chronic
microvascular ischemic disease noted involving the supratentorial
cerebral white matter and pons. Multiple scattered remote infarcts
involving the bilateral frontal lobes, parietal lobes, and left
occipital lobe noted. Scattered areas of associated chronic
hemosiderin staining present about these areas of infarction. Small
focus of diffusion abnormality about a chronic left occipital
infarct felt to be consistent with susceptibility artifact (series
2, image 27).

No other evidence for acute or subacute ischemia. Gray-white matter
differentiation otherwise maintained. No acute intracranial
hemorrhage.

No mass lesion, midline shift or mass effect. No hydrocephalus or
extra-axial fluid collection. Partially empty sella noted. Midline
structures intact. No abnormal enhancement.

Vascular: Major intracranial vascular flow voids are maintained.

Skull and upper cervical spine: Craniocervical junction within
normal limits. Bone marrow signal intensity normal. No scalp soft
tissue abnormality.

Sinuses/Orbits: Globes orbital soft tissues demonstrate no acute
finding. Mild scattered mucosal thickening noted within the
ethmoidal air cells and maxillary sinuses. No significant mastoid
effusion.

Other: None.
IMPRESSION: 1. No acute intracranial abnormality.
2. Underlying mild chronic small vessel ischemic disease with
multiple scattered remote cortical infarcts involving the bilateral
cerebral hemispheres as above.

## 2024-02-12 IMAGING — CT CT HEAD W/O CM
3 series · 15 of 47 positions shown, 18 images · non-contrast
Comparison: MRI head 07/20/2021

CLINICAL DATA: TIA.



[Series 3: head 5.0 h30s · axial · 0.39mm/px · z∈[-166,-42]mm · 9 of 30 slices shown, 12 images]
[im 3/30  brain]
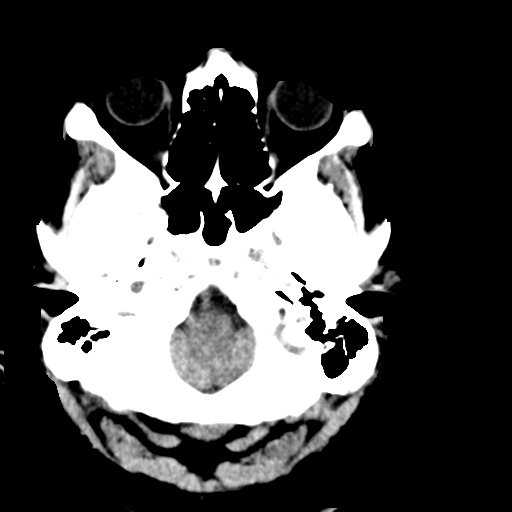
[im 3/30  bone]
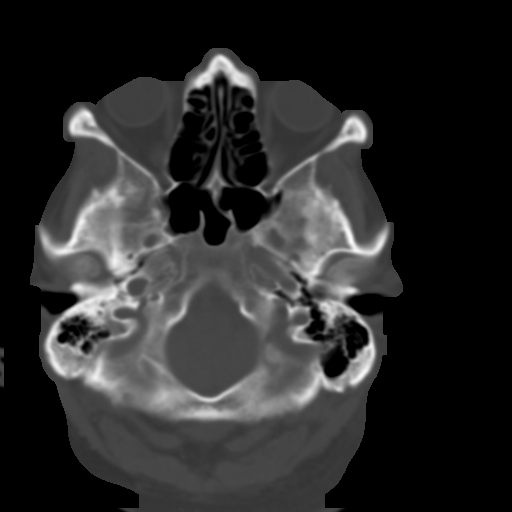
[im 6/30  brain]
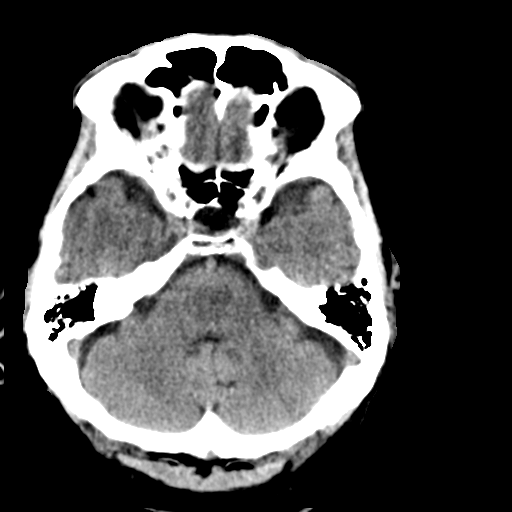
[im 9/30  brain]
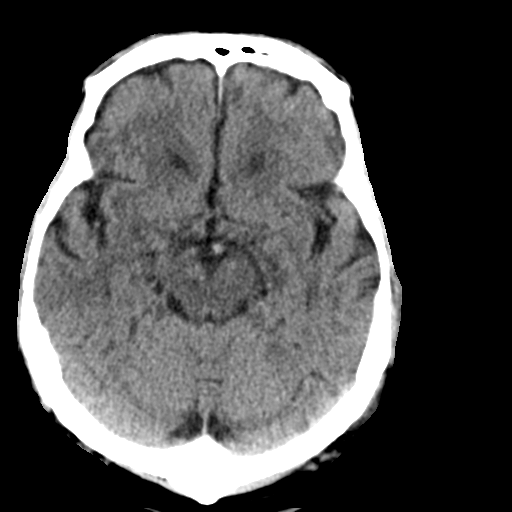
[im 12/30  brain]
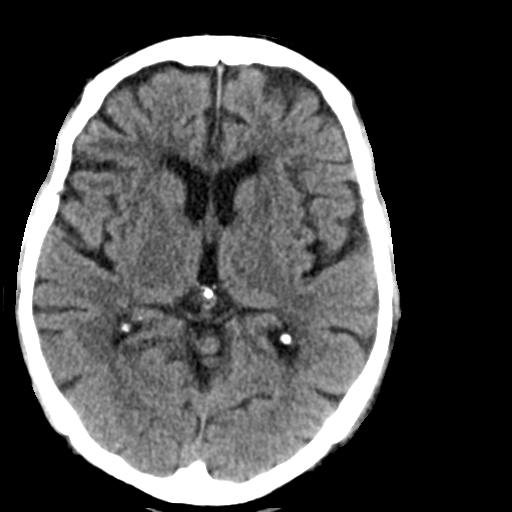
[im 16/30  brain]
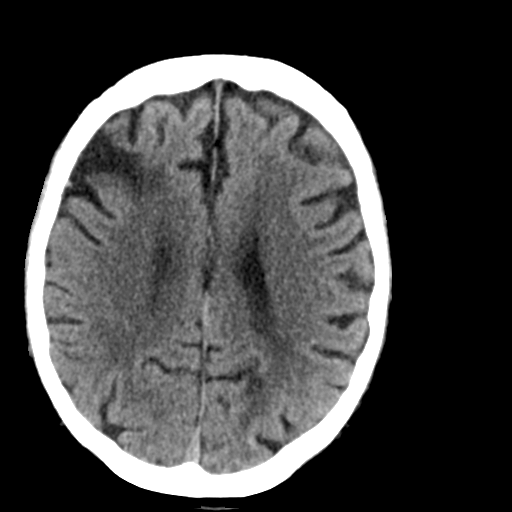
[im 16/30  bone]
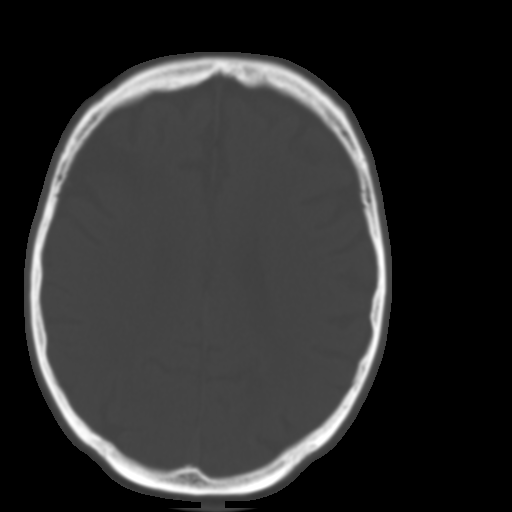
[im 19/30  brain]
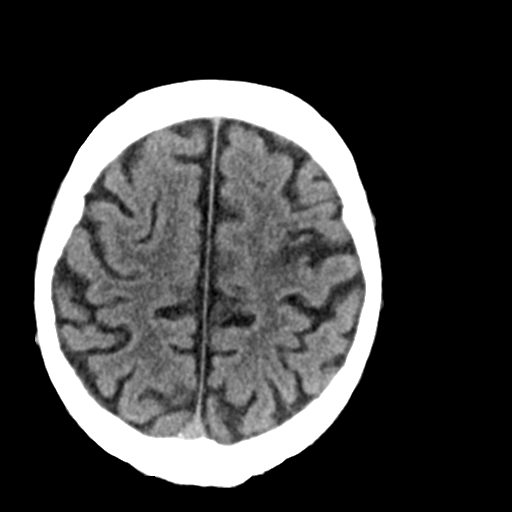
[im 22/30  brain]
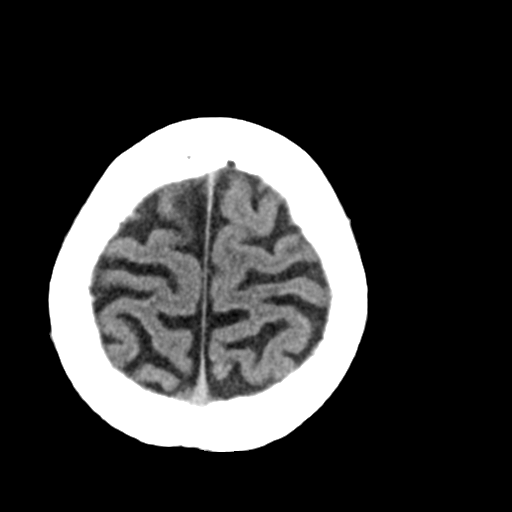
[im 25/30  brain]
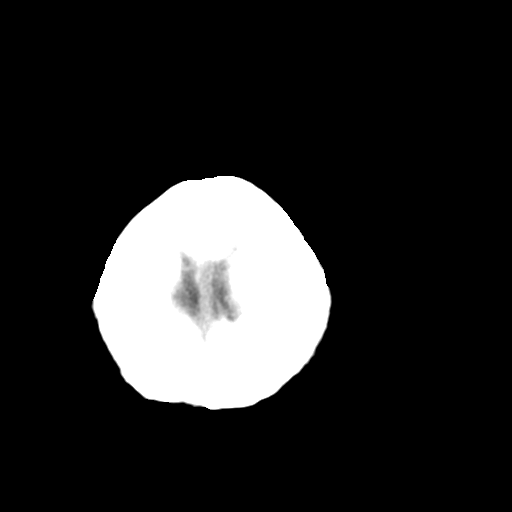
[im 28/30  brain]
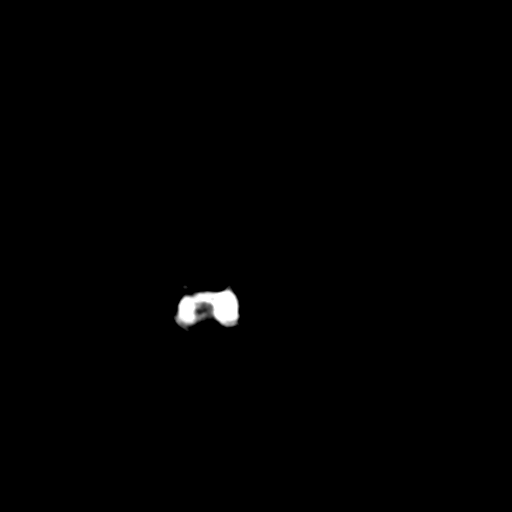
[im 28/30  bone]
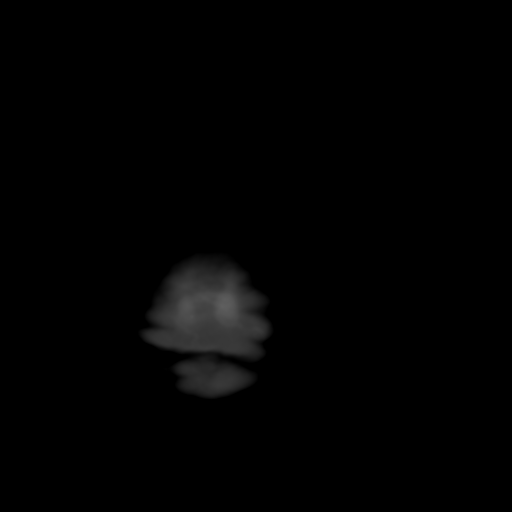

[Series 5: head 3.0 mpr cor · coronal · 0.29mm/px · 3 of 68 slices shown]
[im 23/68  brain]
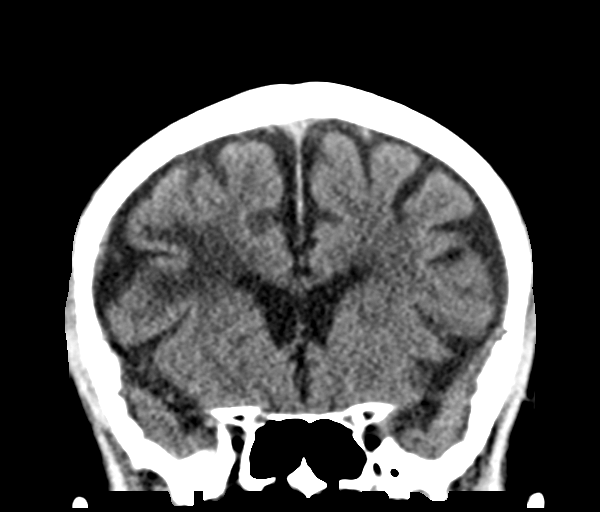
[im 30/68  brain]
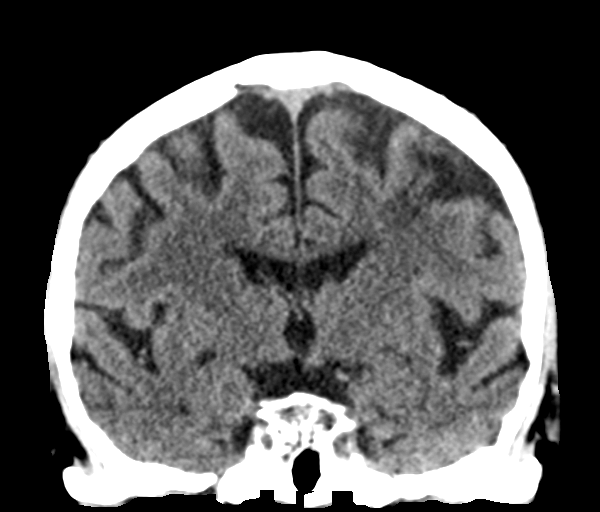
[im 38/68  brain]
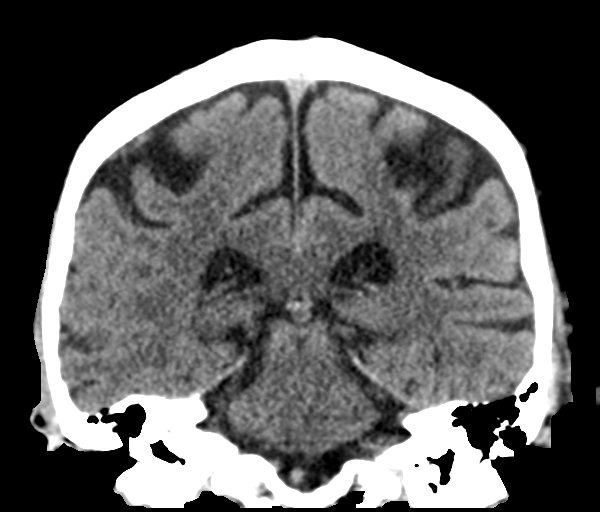

[Series 6: head 3.0 mpr sag · sagittal · 0.29mm/px · 3 of 58 slices shown]
[im 20/58  brain]
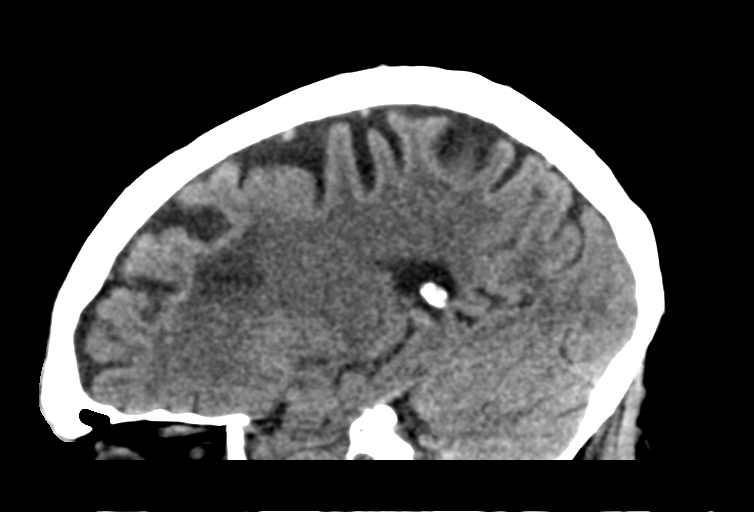
[im 29/58  brain]
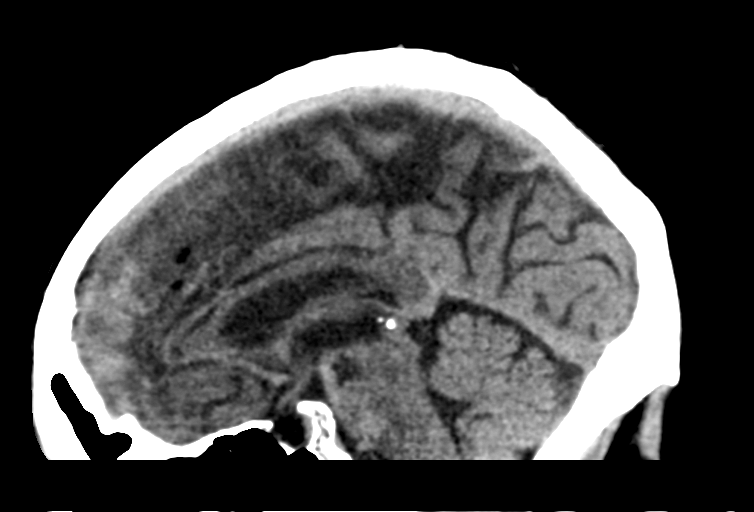
[im 39/58  brain]
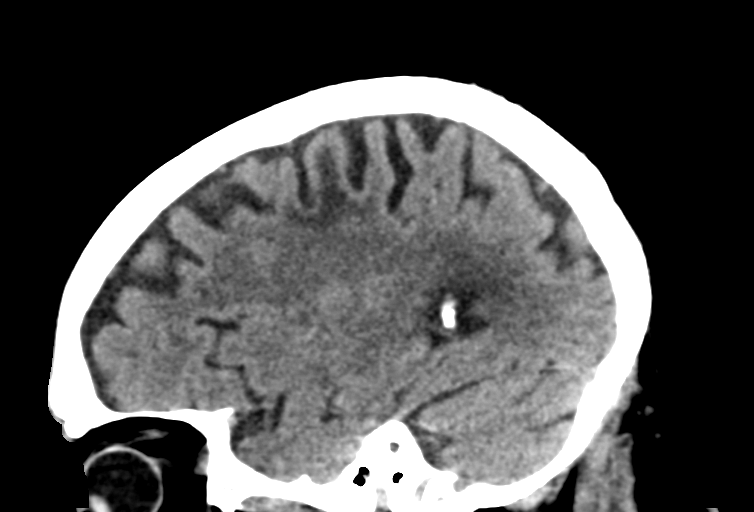

[15 of 47 positions shown; findings below may reference images not displayed]

FINDINGS: Brain: Chronic infarct left frontal lobe. Small chronic infarct left
high parietal lobe. Small chronic infarct high left frontal lobe.
Chronic microvascular ischemic changes in the white matter.

Negative for acute infarct, hemorrhage, mass. Empty sella with mild
enlargement of the sella filled with CSF.

Vascular: Negative for hyperdense vessel

Skull: Negative

Sinuses/Orbits: Paranasal sinuses clear.  Negative orbit

Other: None
IMPRESSION: Chronic microvascular ischemic changes. Chronic cortical infarcts
bilaterally raising the possibility of cerebral emboli.

No acute abnormality.

## 2024-04-23 ENCOUNTER — Ambulatory Visit: Payer: Medicare Other | Admitting: Emergency Medicine
